# Patient Record
Sex: Male | Born: 1945 | Race: White | Hispanic: No | Marital: Married | Smoking: Former smoker
Health system: Southern US, Community
[De-identification: ages and names within clinical notes are randomized; demographics above are authoritative.]

## PROBLEM LIST (undated history)

## (undated) DIAGNOSIS — I10 Essential (primary) hypertension: Secondary | ICD-10-CM

## (undated) DIAGNOSIS — I2510 Atherosclerotic heart disease of native coronary artery without angina pectoris: Secondary | ICD-10-CM

## (undated) DIAGNOSIS — L409 Psoriasis, unspecified: Secondary | ICD-10-CM

## (undated) DIAGNOSIS — E785 Hyperlipidemia, unspecified: Secondary | ICD-10-CM

## (undated) DIAGNOSIS — I6529 Occlusion and stenosis of unspecified carotid artery: Secondary | ICD-10-CM

## (undated) DIAGNOSIS — C801 Malignant (primary) neoplasm, unspecified: Secondary | ICD-10-CM

## (undated) HISTORY — DX: Occlusion and stenosis of unspecified carotid artery: I65.29

## (undated) HISTORY — DX: Essential (primary) hypertension: I10

## (undated) HISTORY — PX: COLONOSCOPY: SHX174

## (undated) HISTORY — DX: Atherosclerotic heart disease of native coronary artery without angina pectoris: I25.10

## (undated) HISTORY — PX: SHOULDER SURGERY: SHX246

## (undated) HISTORY — DX: Hyperlipidemia, unspecified: E78.5

## (undated) HISTORY — DX: Psoriasis, unspecified: L40.9

## (undated) HISTORY — PX: CORONARY ANGIOPLASTY WITH STENT PLACEMENT: SHX49

## (undated) HISTORY — PX: OTHER SURGICAL HISTORY: SHX169

## (undated) HISTORY — PX: CHOLECYSTECTOMY: SHX55

## (undated) HISTORY — DX: Malignant (primary) neoplasm, unspecified: C80.1

---

## 1997-08-17 HISTORY — PX: LIVER BIOPSY: SHX301

## 2004-09-10 ENCOUNTER — Emergency Department: Payer: Self-pay | Admitting: General Surgery

## 2009-10-06 ENCOUNTER — Emergency Department: Payer: Self-pay | Admitting: Urology

## 2010-10-07 ENCOUNTER — Emergency Department: Payer: Self-pay | Admitting: Emergency Medicine

## 2012-07-04 DIAGNOSIS — E785 Hyperlipidemia, unspecified: Secondary | ICD-10-CM | POA: Diagnosis not present

## 2012-07-06 DIAGNOSIS — I4891 Unspecified atrial fibrillation: Secondary | ICD-10-CM | POA: Diagnosis not present

## 2012-07-08 DIAGNOSIS — E782 Mixed hyperlipidemia: Secondary | ICD-10-CM | POA: Diagnosis not present

## 2012-07-08 DIAGNOSIS — I209 Angina pectoris, unspecified: Secondary | ICD-10-CM | POA: Diagnosis not present

## 2012-07-08 DIAGNOSIS — I4891 Unspecified atrial fibrillation: Secondary | ICD-10-CM | POA: Diagnosis not present

## 2012-07-08 DIAGNOSIS — I10 Essential (primary) hypertension: Secondary | ICD-10-CM | POA: Diagnosis not present

## 2012-07-12 DIAGNOSIS — I4891 Unspecified atrial fibrillation: Secondary | ICD-10-CM | POA: Diagnosis not present

## 2012-07-22 DIAGNOSIS — I209 Angina pectoris, unspecified: Secondary | ICD-10-CM | POA: Diagnosis not present

## 2012-07-25 DIAGNOSIS — R9430 Abnormal result of cardiovascular function study, unspecified: Secondary | ICD-10-CM | POA: Diagnosis not present

## 2012-07-25 DIAGNOSIS — I10 Essential (primary) hypertension: Secondary | ICD-10-CM | POA: Diagnosis not present

## 2012-07-25 DIAGNOSIS — I4891 Unspecified atrial fibrillation: Secondary | ICD-10-CM | POA: Diagnosis not present

## 2012-07-25 DIAGNOSIS — E782 Mixed hyperlipidemia: Secondary | ICD-10-CM | POA: Diagnosis not present

## 2012-07-27 ENCOUNTER — Ambulatory Visit: Payer: Self-pay | Admitting: Internal Medicine

## 2012-07-27 DIAGNOSIS — R079 Chest pain, unspecified: Secondary | ICD-10-CM | POA: Diagnosis not present

## 2012-07-27 DIAGNOSIS — I2510 Atherosclerotic heart disease of native coronary artery without angina pectoris: Secondary | ICD-10-CM | POA: Diagnosis not present

## 2012-07-27 DIAGNOSIS — I200 Unstable angina: Secondary | ICD-10-CM | POA: Diagnosis not present

## 2012-07-27 DIAGNOSIS — I10 Essential (primary) hypertension: Secondary | ICD-10-CM | POA: Diagnosis not present

## 2012-07-27 DIAGNOSIS — R0602 Shortness of breath: Secondary | ICD-10-CM | POA: Diagnosis not present

## 2012-07-27 DIAGNOSIS — E785 Hyperlipidemia, unspecified: Secondary | ICD-10-CM | POA: Diagnosis not present

## 2012-07-27 DIAGNOSIS — Z7982 Long term (current) use of aspirin: Secondary | ICD-10-CM | POA: Diagnosis not present

## 2012-07-27 DIAGNOSIS — I209 Angina pectoris, unspecified: Secondary | ICD-10-CM | POA: Diagnosis not present

## 2012-07-27 DIAGNOSIS — Z79899 Other long term (current) drug therapy: Secondary | ICD-10-CM | POA: Diagnosis not present

## 2012-07-27 DIAGNOSIS — I4891 Unspecified atrial fibrillation: Secondary | ICD-10-CM | POA: Diagnosis not present

## 2012-07-28 DIAGNOSIS — I2510 Atherosclerotic heart disease of native coronary artery without angina pectoris: Secondary | ICD-10-CM | POA: Diagnosis not present

## 2012-07-28 DIAGNOSIS — I10 Essential (primary) hypertension: Secondary | ICD-10-CM | POA: Diagnosis not present

## 2012-07-28 DIAGNOSIS — I209 Angina pectoris, unspecified: Secondary | ICD-10-CM | POA: Diagnosis not present

## 2012-07-28 LAB — BASIC METABOLIC PANEL
Anion Gap: 8 (ref 7–16)
Co2: 26 mmol/L (ref 21–32)
Creatinine: 1.3 mg/dL (ref 0.60–1.30)
EGFR (African American): >60
Potassium: 3.9 mmol/L (ref 3.5–5.1)
Sodium: 140 mmol/L (ref 136–145)

## 2012-08-03 DIAGNOSIS — Z23 Encounter for immunization: Secondary | ICD-10-CM | POA: Diagnosis not present

## 2012-08-19 DIAGNOSIS — I2510 Atherosclerotic heart disease of native coronary artery without angina pectoris: Secondary | ICD-10-CM | POA: Diagnosis not present

## 2012-08-19 DIAGNOSIS — E782 Mixed hyperlipidemia: Secondary | ICD-10-CM | POA: Diagnosis not present

## 2012-08-19 DIAGNOSIS — I495 Sick sinus syndrome: Secondary | ICD-10-CM | POA: Diagnosis not present

## 2012-08-19 DIAGNOSIS — I10 Essential (primary) hypertension: Secondary | ICD-10-CM | POA: Diagnosis not present

## 2012-09-07 ENCOUNTER — Encounter: Payer: Self-pay | Admitting: Internal Medicine

## 2012-09-07 DIAGNOSIS — I509 Heart failure, unspecified: Secondary | ICD-10-CM | POA: Diagnosis not present

## 2012-09-07 DIAGNOSIS — Z9861 Coronary angioplasty status: Secondary | ICD-10-CM | POA: Diagnosis not present

## 2012-09-07 DIAGNOSIS — Z951 Presence of aortocoronary bypass graft: Secondary | ICD-10-CM | POA: Diagnosis not present

## 2012-09-07 DIAGNOSIS — I209 Angina pectoris, unspecified: Secondary | ICD-10-CM | POA: Diagnosis not present

## 2012-09-07 DIAGNOSIS — I252 Old myocardial infarction: Secondary | ICD-10-CM | POA: Diagnosis not present

## 2012-09-07 DIAGNOSIS — Z5189 Encounter for other specified aftercare: Secondary | ICD-10-CM | POA: Diagnosis not present

## 2012-09-17 ENCOUNTER — Encounter: Payer: Self-pay | Admitting: Internal Medicine

## 2012-09-17 DIAGNOSIS — I252 Old myocardial infarction: Secondary | ICD-10-CM | POA: Diagnosis not present

## 2012-09-17 DIAGNOSIS — Z9861 Coronary angioplasty status: Secondary | ICD-10-CM | POA: Diagnosis not present

## 2012-09-17 DIAGNOSIS — Z5189 Encounter for other specified aftercare: Secondary | ICD-10-CM | POA: Diagnosis not present

## 2012-09-17 DIAGNOSIS — I509 Heart failure, unspecified: Secondary | ICD-10-CM | POA: Diagnosis not present

## 2012-09-17 DIAGNOSIS — Z951 Presence of aortocoronary bypass graft: Secondary | ICD-10-CM | POA: Diagnosis not present

## 2012-09-20 DIAGNOSIS — I2510 Atherosclerotic heart disease of native coronary artery without angina pectoris: Secondary | ICD-10-CM | POA: Diagnosis not present

## 2012-09-20 DIAGNOSIS — I495 Sick sinus syndrome: Secondary | ICD-10-CM | POA: Diagnosis not present

## 2012-09-20 DIAGNOSIS — E782 Mixed hyperlipidemia: Secondary | ICD-10-CM | POA: Diagnosis not present

## 2012-09-20 DIAGNOSIS — I10 Essential (primary) hypertension: Secondary | ICD-10-CM | POA: Diagnosis not present

## 2012-09-22 DIAGNOSIS — L408 Other psoriasis: Secondary | ICD-10-CM | POA: Diagnosis not present

## 2012-10-06 DIAGNOSIS — L408 Other psoriasis: Secondary | ICD-10-CM | POA: Diagnosis not present

## 2012-10-06 DIAGNOSIS — Z79899 Other long term (current) drug therapy: Secondary | ICD-10-CM | POA: Diagnosis not present

## 2012-10-15 ENCOUNTER — Encounter: Payer: Self-pay | Admitting: Internal Medicine

## 2012-10-15 DIAGNOSIS — Z5189 Encounter for other specified aftercare: Secondary | ICD-10-CM | POA: Diagnosis not present

## 2012-10-15 DIAGNOSIS — Z9861 Coronary angioplasty status: Secondary | ICD-10-CM | POA: Diagnosis not present

## 2012-10-15 DIAGNOSIS — I209 Angina pectoris, unspecified: Secondary | ICD-10-CM | POA: Diagnosis not present

## 2012-10-19 DIAGNOSIS — I495 Sick sinus syndrome: Secondary | ICD-10-CM | POA: Diagnosis not present

## 2012-10-19 DIAGNOSIS — E782 Mixed hyperlipidemia: Secondary | ICD-10-CM | POA: Diagnosis not present

## 2012-10-19 DIAGNOSIS — I119 Hypertensive heart disease without heart failure: Secondary | ICD-10-CM | POA: Diagnosis not present

## 2012-10-19 DIAGNOSIS — I2510 Atherosclerotic heart disease of native coronary artery without angina pectoris: Secondary | ICD-10-CM | POA: Diagnosis not present

## 2012-10-25 DIAGNOSIS — C61 Malignant neoplasm of prostate: Secondary | ICD-10-CM | POA: Diagnosis not present

## 2012-11-14 DIAGNOSIS — E7800 Pure hypercholesterolemia, unspecified: Secondary | ICD-10-CM | POA: Diagnosis not present

## 2012-11-14 DIAGNOSIS — I10 Essential (primary) hypertension: Secondary | ICD-10-CM | POA: Diagnosis not present

## 2012-11-14 DIAGNOSIS — I2510 Atherosclerotic heart disease of native coronary artery without angina pectoris: Secondary | ICD-10-CM | POA: Diagnosis not present

## 2012-11-15 ENCOUNTER — Encounter: Payer: Self-pay | Admitting: Internal Medicine

## 2012-11-15 DIAGNOSIS — Z9861 Coronary angioplasty status: Secondary | ICD-10-CM | POA: Diagnosis not present

## 2012-11-15 DIAGNOSIS — Z5189 Encounter for other specified aftercare: Secondary | ICD-10-CM | POA: Diagnosis not present

## 2012-11-16 DIAGNOSIS — Z9861 Coronary angioplasty status: Secondary | ICD-10-CM | POA: Diagnosis not present

## 2012-11-16 DIAGNOSIS — Z5189 Encounter for other specified aftercare: Secondary | ICD-10-CM | POA: Diagnosis not present

## 2012-12-01 DIAGNOSIS — I119 Hypertensive heart disease without heart failure: Secondary | ICD-10-CM | POA: Diagnosis not present

## 2012-12-01 DIAGNOSIS — I2510 Atherosclerotic heart disease of native coronary artery without angina pectoris: Secondary | ICD-10-CM | POA: Diagnosis not present

## 2012-12-01 DIAGNOSIS — I209 Angina pectoris, unspecified: Secondary | ICD-10-CM | POA: Diagnosis not present

## 2012-12-01 DIAGNOSIS — E782 Mixed hyperlipidemia: Secondary | ICD-10-CM | POA: Diagnosis not present

## 2012-12-12 DIAGNOSIS — L408 Other psoriasis: Secondary | ICD-10-CM | POA: Diagnosis not present

## 2012-12-30 DIAGNOSIS — I209 Angina pectoris, unspecified: Secondary | ICD-10-CM | POA: Diagnosis not present

## 2013-01-03 DIAGNOSIS — E7800 Pure hypercholesterolemia, unspecified: Secondary | ICD-10-CM | POA: Diagnosis not present

## 2013-01-03 DIAGNOSIS — E785 Hyperlipidemia, unspecified: Secondary | ICD-10-CM | POA: Diagnosis not present

## 2013-01-03 DIAGNOSIS — I2510 Atherosclerotic heart disease of native coronary artery without angina pectoris: Secondary | ICD-10-CM | POA: Diagnosis not present

## 2013-01-03 DIAGNOSIS — I10 Essential (primary) hypertension: Secondary | ICD-10-CM | POA: Diagnosis not present

## 2013-02-01 DIAGNOSIS — I2510 Atherosclerotic heart disease of native coronary artery without angina pectoris: Secondary | ICD-10-CM | POA: Diagnosis not present

## 2013-02-01 DIAGNOSIS — E7800 Pure hypercholesterolemia, unspecified: Secondary | ICD-10-CM | POA: Diagnosis not present

## 2013-02-01 DIAGNOSIS — I10 Essential (primary) hypertension: Secondary | ICD-10-CM | POA: Diagnosis not present

## 2013-02-07 DIAGNOSIS — I119 Hypertensive heart disease without heart failure: Secondary | ICD-10-CM | POA: Diagnosis not present

## 2013-02-07 DIAGNOSIS — I2510 Atherosclerotic heart disease of native coronary artery without angina pectoris: Secondary | ICD-10-CM | POA: Diagnosis not present

## 2013-02-07 DIAGNOSIS — E782 Mixed hyperlipidemia: Secondary | ICD-10-CM | POA: Diagnosis not present

## 2013-02-07 DIAGNOSIS — I495 Sick sinus syndrome: Secondary | ICD-10-CM | POA: Diagnosis not present

## 2013-05-01 DIAGNOSIS — E785 Hyperlipidemia, unspecified: Secondary | ICD-10-CM | POA: Diagnosis not present

## 2013-05-01 DIAGNOSIS — I10 Essential (primary) hypertension: Secondary | ICD-10-CM | POA: Diagnosis not present

## 2013-05-01 DIAGNOSIS — Z125 Encounter for screening for malignant neoplasm of prostate: Secondary | ICD-10-CM | POA: Diagnosis not present

## 2013-05-01 DIAGNOSIS — Z0000 Encounter for general adult medical examination without abnormal findings: Secondary | ICD-10-CM | POA: Diagnosis not present

## 2013-05-04 DIAGNOSIS — D400 Neoplasm of uncertain behavior of prostate: Secondary | ICD-10-CM | POA: Diagnosis not present

## 2013-05-04 DIAGNOSIS — N529 Male erectile dysfunction, unspecified: Secondary | ICD-10-CM | POA: Diagnosis not present

## 2013-05-04 DIAGNOSIS — C61 Malignant neoplasm of prostate: Secondary | ICD-10-CM | POA: Diagnosis not present

## 2013-05-04 DIAGNOSIS — Z23 Encounter for immunization: Secondary | ICD-10-CM | POA: Diagnosis not present

## 2013-05-12 DIAGNOSIS — I6529 Occlusion and stenosis of unspecified carotid artery: Secondary | ICD-10-CM | POA: Diagnosis not present

## 2013-05-12 DIAGNOSIS — I2510 Atherosclerotic heart disease of native coronary artery without angina pectoris: Secondary | ICD-10-CM | POA: Diagnosis not present

## 2013-05-12 DIAGNOSIS — I10 Essential (primary) hypertension: Secondary | ICD-10-CM | POA: Diagnosis not present

## 2013-07-31 DIAGNOSIS — I4949 Other premature depolarization: Secondary | ICD-10-CM | POA: Diagnosis not present

## 2013-07-31 DIAGNOSIS — I059 Rheumatic mitral valve disease, unspecified: Secondary | ICD-10-CM | POA: Diagnosis not present

## 2013-07-31 DIAGNOSIS — I2510 Atherosclerotic heart disease of native coronary artery without angina pectoris: Secondary | ICD-10-CM | POA: Diagnosis not present

## 2013-07-31 DIAGNOSIS — I119 Hypertensive heart disease without heart failure: Secondary | ICD-10-CM | POA: Diagnosis not present

## 2013-09-13 DIAGNOSIS — L408 Other psoriasis: Secondary | ICD-10-CM | POA: Diagnosis not present

## 2013-10-09 DIAGNOSIS — C61 Malignant neoplasm of prostate: Secondary | ICD-10-CM | POA: Diagnosis not present

## 2013-10-09 DIAGNOSIS — I10 Essential (primary) hypertension: Secondary | ICD-10-CM | POA: Diagnosis not present

## 2013-10-09 DIAGNOSIS — E785 Hyperlipidemia, unspecified: Secondary | ICD-10-CM | POA: Diagnosis not present

## 2013-10-11 DIAGNOSIS — K648 Other hemorrhoids: Secondary | ICD-10-CM | POA: Diagnosis not present

## 2013-10-11 DIAGNOSIS — I10 Essential (primary) hypertension: Secondary | ICD-10-CM | POA: Diagnosis not present

## 2013-10-24 DIAGNOSIS — D400 Neoplasm of uncertain behavior of prostate: Secondary | ICD-10-CM | POA: Diagnosis not present

## 2013-10-24 DIAGNOSIS — C61 Malignant neoplasm of prostate: Secondary | ICD-10-CM | POA: Diagnosis not present

## 2013-11-08 DIAGNOSIS — I2510 Atherosclerotic heart disease of native coronary artery without angina pectoris: Secondary | ICD-10-CM | POA: Diagnosis not present

## 2013-11-08 DIAGNOSIS — I10 Essential (primary) hypertension: Secondary | ICD-10-CM | POA: Diagnosis not present

## 2013-11-08 DIAGNOSIS — E785 Hyperlipidemia, unspecified: Secondary | ICD-10-CM | POA: Diagnosis not present

## 2013-11-10 DIAGNOSIS — I10 Essential (primary) hypertension: Secondary | ICD-10-CM | POA: Diagnosis not present

## 2013-11-10 DIAGNOSIS — I6529 Occlusion and stenosis of unspecified carotid artery: Secondary | ICD-10-CM | POA: Diagnosis not present

## 2013-12-14 DIAGNOSIS — I10 Essential (primary) hypertension: Secondary | ICD-10-CM | POA: Diagnosis not present

## 2014-01-29 DIAGNOSIS — I10 Essential (primary) hypertension: Secondary | ICD-10-CM | POA: Diagnosis not present

## 2014-01-29 DIAGNOSIS — I498 Other specified cardiac arrhythmias: Secondary | ICD-10-CM | POA: Diagnosis not present

## 2014-01-29 DIAGNOSIS — I4891 Unspecified atrial fibrillation: Secondary | ICD-10-CM | POA: Diagnosis not present

## 2014-01-29 DIAGNOSIS — I2510 Atherosclerotic heart disease of native coronary artery without angina pectoris: Secondary | ICD-10-CM | POA: Diagnosis not present

## 2014-04-24 DIAGNOSIS — C61 Malignant neoplasm of prostate: Secondary | ICD-10-CM | POA: Diagnosis not present

## 2014-04-24 DIAGNOSIS — D400 Neoplasm of uncertain behavior of prostate: Secondary | ICD-10-CM | POA: Diagnosis not present

## 2014-04-24 DIAGNOSIS — N529 Male erectile dysfunction, unspecified: Secondary | ICD-10-CM | POA: Diagnosis not present

## 2014-04-25 DIAGNOSIS — L408 Other psoriasis: Secondary | ICD-10-CM | POA: Diagnosis not present

## 2014-05-03 DIAGNOSIS — Z23 Encounter for immunization: Secondary | ICD-10-CM | POA: Diagnosis not present

## 2014-05-07 DIAGNOSIS — Z125 Encounter for screening for malignant neoplasm of prostate: Secondary | ICD-10-CM | POA: Diagnosis not present

## 2014-05-07 DIAGNOSIS — I2510 Atherosclerotic heart disease of native coronary artery without angina pectoris: Secondary | ICD-10-CM | POA: Diagnosis not present

## 2014-05-07 DIAGNOSIS — E785 Hyperlipidemia, unspecified: Secondary | ICD-10-CM | POA: Diagnosis not present

## 2014-05-07 DIAGNOSIS — I10 Essential (primary) hypertension: Secondary | ICD-10-CM | POA: Diagnosis not present

## 2014-05-07 DIAGNOSIS — I6529 Occlusion and stenosis of unspecified carotid artery: Secondary | ICD-10-CM | POA: Diagnosis not present

## 2014-05-07 DIAGNOSIS — Z23 Encounter for immunization: Secondary | ICD-10-CM | POA: Diagnosis not present

## 2014-05-07 DIAGNOSIS — Z0000 Encounter for general adult medical examination without abnormal findings: Secondary | ICD-10-CM | POA: Diagnosis not present

## 2014-05-07 DIAGNOSIS — D075 Carcinoma in situ of prostate: Secondary | ICD-10-CM | POA: Diagnosis not present

## 2014-05-18 DIAGNOSIS — Z79899 Other long term (current) drug therapy: Secondary | ICD-10-CM | POA: Diagnosis not present

## 2014-05-18 DIAGNOSIS — Z1211 Encounter for screening for malignant neoplasm of colon: Secondary | ICD-10-CM | POA: Diagnosis not present

## 2014-06-11 ENCOUNTER — Ambulatory Visit: Payer: Self-pay | Admitting: Gastroenterology

## 2014-06-11 DIAGNOSIS — I10 Essential (primary) hypertension: Secondary | ICD-10-CM | POA: Diagnosis not present

## 2014-06-11 DIAGNOSIS — K5730 Diverticulosis of large intestine without perforation or abscess without bleeding: Secondary | ICD-10-CM | POA: Diagnosis not present

## 2014-06-11 DIAGNOSIS — D120 Benign neoplasm of cecum: Secondary | ICD-10-CM | POA: Diagnosis not present

## 2014-06-11 DIAGNOSIS — R12 Heartburn: Secondary | ICD-10-CM | POA: Diagnosis not present

## 2014-06-11 DIAGNOSIS — Z6831 Body mass index (BMI) 31.0-31.9, adult: Secondary | ICD-10-CM | POA: Diagnosis not present

## 2014-06-11 DIAGNOSIS — E669 Obesity, unspecified: Secondary | ICD-10-CM | POA: Diagnosis not present

## 2014-06-11 DIAGNOSIS — D124 Benign neoplasm of descending colon: Secondary | ICD-10-CM | POA: Diagnosis not present

## 2014-06-11 DIAGNOSIS — M159 Polyosteoarthritis, unspecified: Secondary | ICD-10-CM | POA: Diagnosis not present

## 2014-06-11 DIAGNOSIS — Z1211 Encounter for screening for malignant neoplasm of colon: Secondary | ICD-10-CM | POA: Diagnosis not present

## 2014-06-11 DIAGNOSIS — L408 Other psoriasis: Secondary | ICD-10-CM | POA: Diagnosis not present

## 2014-06-11 DIAGNOSIS — I2510 Atherosclerotic heart disease of native coronary artery without angina pectoris: Secondary | ICD-10-CM | POA: Diagnosis not present

## 2014-06-11 DIAGNOSIS — D126 Benign neoplasm of colon, unspecified: Secondary | ICD-10-CM | POA: Diagnosis not present

## 2014-06-11 DIAGNOSIS — D125 Benign neoplasm of sigmoid colon: Secondary | ICD-10-CM | POA: Diagnosis not present

## 2014-06-11 DIAGNOSIS — Z87891 Personal history of nicotine dependence: Secondary | ICD-10-CM | POA: Diagnosis not present

## 2014-06-11 DIAGNOSIS — Z955 Presence of coronary angioplasty implant and graft: Secondary | ICD-10-CM | POA: Diagnosis not present

## 2014-06-11 DIAGNOSIS — Z79899 Other long term (current) drug therapy: Secondary | ICD-10-CM | POA: Diagnosis not present

## 2014-06-11 DIAGNOSIS — E785 Hyperlipidemia, unspecified: Secondary | ICD-10-CM | POA: Diagnosis not present

## 2014-06-11 DIAGNOSIS — D122 Benign neoplasm of ascending colon: Secondary | ICD-10-CM | POA: Diagnosis not present

## 2014-06-11 DIAGNOSIS — Z8546 Personal history of malignant neoplasm of prostate: Secondary | ICD-10-CM | POA: Diagnosis not present

## 2014-06-11 DIAGNOSIS — D123 Benign neoplasm of transverse colon: Secondary | ICD-10-CM | POA: Diagnosis not present

## 2014-07-31 DIAGNOSIS — I255 Ischemic cardiomyopathy: Secondary | ICD-10-CM | POA: Insufficient documentation

## 2014-07-31 DIAGNOSIS — E782 Mixed hyperlipidemia: Secondary | ICD-10-CM | POA: Diagnosis not present

## 2014-07-31 DIAGNOSIS — I10 Essential (primary) hypertension: Secondary | ICD-10-CM | POA: Diagnosis not present

## 2014-07-31 DIAGNOSIS — I480 Paroxysmal atrial fibrillation: Secondary | ICD-10-CM | POA: Insufficient documentation

## 2014-07-31 DIAGNOSIS — R072 Precordial pain: Secondary | ICD-10-CM | POA: Diagnosis not present

## 2014-08-13 DIAGNOSIS — I10 Essential (primary) hypertension: Secondary | ICD-10-CM | POA: Diagnosis not present

## 2014-08-13 DIAGNOSIS — I255 Ischemic cardiomyopathy: Secondary | ICD-10-CM | POA: Diagnosis not present

## 2014-08-13 DIAGNOSIS — R0789 Other chest pain: Secondary | ICD-10-CM | POA: Diagnosis not present

## 2014-08-13 DIAGNOSIS — I2510 Atherosclerotic heart disease of native coronary artery without angina pectoris: Secondary | ICD-10-CM | POA: Diagnosis not present

## 2014-08-13 DIAGNOSIS — E782 Mixed hyperlipidemia: Secondary | ICD-10-CM | POA: Diagnosis not present

## 2014-08-24 DIAGNOSIS — C44319 Basal cell carcinoma of skin of other parts of face: Secondary | ICD-10-CM | POA: Diagnosis not present

## 2014-08-24 DIAGNOSIS — L570 Actinic keratosis: Secondary | ICD-10-CM | POA: Diagnosis not present

## 2014-08-24 DIAGNOSIS — Z5181 Encounter for therapeutic drug level monitoring: Secondary | ICD-10-CM | POA: Diagnosis not present

## 2014-08-24 DIAGNOSIS — L400 Psoriasis vulgaris: Secondary | ICD-10-CM | POA: Diagnosis not present

## 2014-08-24 DIAGNOSIS — L409 Psoriasis, unspecified: Secondary | ICD-10-CM | POA: Diagnosis not present

## 2014-08-24 DIAGNOSIS — D485 Neoplasm of uncertain behavior of skin: Secondary | ICD-10-CM | POA: Diagnosis not present

## 2014-09-02 DIAGNOSIS — I25119 Atherosclerotic heart disease of native coronary artery with unspecified angina pectoris: Secondary | ICD-10-CM | POA: Diagnosis not present

## 2014-09-02 DIAGNOSIS — E782 Mixed hyperlipidemia: Secondary | ICD-10-CM | POA: Diagnosis not present

## 2014-09-02 DIAGNOSIS — C61 Malignant neoplasm of prostate: Secondary | ICD-10-CM | POA: Diagnosis not present

## 2014-09-02 DIAGNOSIS — K567 Ileus, unspecified: Secondary | ICD-10-CM | POA: Diagnosis not present

## 2014-09-02 DIAGNOSIS — I517 Cardiomegaly: Secondary | ICD-10-CM | POA: Diagnosis not present

## 2014-09-02 DIAGNOSIS — I10 Essential (primary) hypertension: Secondary | ICD-10-CM | POA: Diagnosis not present

## 2014-09-02 DIAGNOSIS — K6389 Other specified diseases of intestine: Secondary | ICD-10-CM | POA: Diagnosis not present

## 2014-09-02 DIAGNOSIS — I200 Unstable angina: Secondary | ICD-10-CM | POA: Diagnosis not present

## 2014-09-02 DIAGNOSIS — R0789 Other chest pain: Secondary | ICD-10-CM | POA: Diagnosis not present

## 2014-09-02 DIAGNOSIS — I2510 Atherosclerotic heart disease of native coronary artery without angina pectoris: Secondary | ICD-10-CM | POA: Diagnosis not present

## 2014-09-02 DIAGNOSIS — R079 Chest pain, unspecified: Secondary | ICD-10-CM | POA: Diagnosis not present

## 2014-09-02 DIAGNOSIS — R109 Unspecified abdominal pain: Secondary | ICD-10-CM | POA: Diagnosis not present

## 2014-09-02 LAB — CBC
HCT: 43.3 % (ref 40.0–52.0)
HGB: 14.5 g/dL (ref 13.0–18.0)
MCH: 32.6 pg (ref 26.0–34.0)
MCHC: 33.6 g/dL (ref 32.0–36.0)
MCV: 97 fL (ref 80–100)
Platelet: 157 10*3/uL (ref 150–440)
RBC: 4.45 10*6/uL (ref 4.40–5.90)
RDW: 13.1 % (ref 11.5–14.5)
WBC: 9.2 10*3/uL (ref 3.8–10.6)

## 2014-09-02 LAB — BASIC METABOLIC PANEL
Anion Gap: 7 (ref 7–16)
BUN: 25 mg/dL — ABNORMAL HIGH (ref 7–18)
Calcium, Total: 9.2 mg/dL (ref 8.5–10.1)
Chloride: 106 mmol/L (ref 98–107)
Co2: 28 mmol/L (ref 21–32)
Creatinine: 1.3 mg/dL (ref 0.60–1.30)
EGFR (Non-African Amer.): 59 — ABNORMAL LOW
Glucose: 124 mg/dL — ABNORMAL HIGH (ref 65–99)
OSMOLALITY: 287 (ref 275–301)
POTASSIUM: 3.7 mmol/L (ref 3.5–5.1)
Sodium: 141 mmol/L (ref 136–145)

## 2014-09-02 LAB — TROPONIN I: Troponin-I: <0.02 ng/mL

## 2014-09-02 LAB — CK TOTAL AND CKMB (NOT AT ARMC)
CK, TOTAL: 102 U/L (ref 39–308)
CK, Total: 119 U/L (ref 39–308)
CK, Total: 108 U/L (ref 39–308)
CK-MB: 1.2 ng/mL (ref 0.5–3.6)
CK-MB: 0.9 ng/mL (ref 0.5–3.6)
CK-MB: 1 ng/mL (ref 0.5–3.6)

## 2014-09-02 LAB — PROTIME-INR
INR: 0.9
PROTHROMBIN TIME: 12.5 s (ref 11.5–14.7)

## 2014-09-03 ENCOUNTER — Inpatient Hospital Stay: Payer: Self-pay | Admitting: Internal Medicine

## 2014-09-03 DIAGNOSIS — M109 Gout, unspecified: Secondary | ICD-10-CM | POA: Diagnosis present

## 2014-09-03 DIAGNOSIS — K810 Acute cholecystitis: Secondary | ICD-10-CM | POA: Diagnosis not present

## 2014-09-03 DIAGNOSIS — Z800 Family history of malignant neoplasm of digestive organs: Secondary | ICD-10-CM | POA: Diagnosis not present

## 2014-09-03 DIAGNOSIS — I10 Essential (primary) hypertension: Secondary | ICD-10-CM | POA: Diagnosis not present

## 2014-09-03 DIAGNOSIS — K567 Ileus, unspecified: Secondary | ICD-10-CM | POA: Diagnosis present

## 2014-09-03 DIAGNOSIS — I25119 Atherosclerotic heart disease of native coronary artery with unspecified angina pectoris: Secondary | ICD-10-CM | POA: Diagnosis not present

## 2014-09-03 DIAGNOSIS — K819 Cholecystitis, unspecified: Secondary | ICD-10-CM | POA: Diagnosis not present

## 2014-09-03 DIAGNOSIS — I2510 Atherosclerotic heart disease of native coronary artery without angina pectoris: Secondary | ICD-10-CM | POA: Diagnosis present

## 2014-09-03 DIAGNOSIS — E669 Obesity, unspecified: Secondary | ICD-10-CM | POA: Diagnosis present

## 2014-09-03 DIAGNOSIS — K5660 Unspecified intestinal obstruction: Secondary | ICD-10-CM | POA: Diagnosis present

## 2014-09-03 DIAGNOSIS — I200 Unstable angina: Secondary | ICD-10-CM | POA: Diagnosis not present

## 2014-09-03 DIAGNOSIS — K5730 Diverticulosis of large intestine without perforation or abscess without bleeding: Secondary | ICD-10-CM | POA: Diagnosis not present

## 2014-09-03 DIAGNOSIS — C61 Malignant neoplasm of prostate: Secondary | ICD-10-CM | POA: Diagnosis not present

## 2014-09-03 DIAGNOSIS — J189 Pneumonia, unspecified organism: Secondary | ICD-10-CM | POA: Diagnosis not present

## 2014-09-03 DIAGNOSIS — I517 Cardiomegaly: Secondary | ICD-10-CM | POA: Diagnosis not present

## 2014-09-03 DIAGNOSIS — Z6832 Body mass index (BMI) 32.0-32.9, adult: Secondary | ICD-10-CM | POA: Diagnosis not present

## 2014-09-03 DIAGNOSIS — R079 Chest pain, unspecified: Secondary | ICD-10-CM | POA: Diagnosis not present

## 2014-09-03 DIAGNOSIS — L409 Psoriasis, unspecified: Secondary | ICD-10-CM | POA: Diagnosis present

## 2014-09-03 DIAGNOSIS — Z955 Presence of coronary angioplasty implant and graft: Secondary | ICD-10-CM | POA: Diagnosis not present

## 2014-09-03 DIAGNOSIS — N3289 Other specified disorders of bladder: Secondary | ICD-10-CM | POA: Diagnosis not present

## 2014-09-03 DIAGNOSIS — Z87891 Personal history of nicotine dependence: Secondary | ICD-10-CM | POA: Diagnosis not present

## 2014-09-03 DIAGNOSIS — Z7902 Long term (current) use of antithrombotics/antiplatelets: Secondary | ICD-10-CM | POA: Diagnosis not present

## 2014-09-03 DIAGNOSIS — E782 Mixed hyperlipidemia: Secondary | ICD-10-CM | POA: Diagnosis present

## 2014-09-03 DIAGNOSIS — K598 Other specified functional intestinal disorders: Secondary | ICD-10-CM | POA: Diagnosis not present

## 2014-09-03 DIAGNOSIS — Z833 Family history of diabetes mellitus: Secondary | ICD-10-CM | POA: Diagnosis not present

## 2014-09-03 DIAGNOSIS — Z8249 Family history of ischemic heart disease and other diseases of the circulatory system: Secondary | ICD-10-CM | POA: Diagnosis not present

## 2014-09-03 DIAGNOSIS — K8020 Calculus of gallbladder without cholecystitis without obstruction: Secondary | ICD-10-CM | POA: Diagnosis present

## 2014-09-03 DIAGNOSIS — J90 Pleural effusion, not elsewhere classified: Secondary | ICD-10-CM | POA: Diagnosis not present

## 2014-09-03 DIAGNOSIS — Z8546 Personal history of malignant neoplasm of prostate: Secondary | ICD-10-CM | POA: Diagnosis not present

## 2014-09-03 DIAGNOSIS — K8000 Calculus of gallbladder with acute cholecystitis without obstruction: Secondary | ICD-10-CM | POA: Diagnosis present

## 2014-09-03 DIAGNOSIS — K6389 Other specified diseases of intestine: Secondary | ICD-10-CM | POA: Diagnosis not present

## 2014-09-03 LAB — BASIC METABOLIC PANEL
Anion Gap: 8 (ref 7–16)
BUN: 17 mg/dL (ref 7–18)
CREATININE: 1.23 mg/dL (ref 0.60–1.30)
Calcium, Total: 9.4 mg/dL (ref 8.5–10.1)
Chloride: 98 mmol/L (ref 98–107)
Co2: 29 mmol/L (ref 21–32)
EGFR (African American): >60
EGFR (Non-African Amer.): >60
Glucose: 148 mg/dL — ABNORMAL HIGH (ref 65–99)
Osmolality: 274 (ref 275–301)
Potassium: 3.5 mmol/L (ref 3.5–5.1)
Sodium: 135 mmol/L — ABNORMAL LOW (ref 136–145)

## 2014-09-05 LAB — BASIC METABOLIC PANEL
Anion Gap: 5 — ABNORMAL LOW (ref 7–16)
BUN: 26 mg/dL — AB (ref 7–18)
CHLORIDE: 98 mmol/L (ref 98–107)
CO2: 32 mmol/L (ref 21–32)
CREATININE: 1.36 mg/dL — AB (ref 0.60–1.30)
Calcium, Total: 7.8 mg/dL — ABNORMAL LOW (ref 8.5–10.1)
GFR CALC NON AF AMER: 56 — AB
Glucose: 108 mg/dL — ABNORMAL HIGH (ref 65–99)
Osmolality: 275 (ref 275–301)
Potassium: 3.8 mmol/L (ref 3.5–5.1)
SODIUM: 135 mmol/L — AB (ref 136–145)

## 2014-09-05 LAB — CBC WITH DIFFERENTIAL/PLATELET
Basophil #: 0 10*3/uL (ref 0.0–0.1)
Basophil %: 0.1 %
EOS ABS: 0 10*3/uL (ref 0.0–0.7)
Eosinophil %: 0.2 %
HCT: 34.7 % — ABNORMAL LOW (ref 40.0–52.0)
HGB: 11.8 g/dL — ABNORMAL LOW (ref 13.0–18.0)
Lymphocyte #: 0.5 10*3/uL — ABNORMAL LOW (ref 1.0–3.6)
Lymphocyte %: 6.6 %
MCH: 32.6 pg (ref 26.0–34.0)
MCHC: 34.1 g/dL (ref 32.0–36.0)
MCV: 96 fL (ref 80–100)
MONO ABS: 0.9 x10 3/mm (ref 0.2–1.0)
Monocyte %: 11.1 %
Neutrophil #: 6.9 10*3/uL — ABNORMAL HIGH (ref 1.4–6.5)
Neutrophil %: 82 %
Platelet: 106 10*3/uL — ABNORMAL LOW (ref 150–440)
RBC: 3.63 10*6/uL — ABNORMAL LOW (ref 4.40–5.90)
RDW: 13.2 % (ref 11.5–14.5)
WBC: 8.4 10*3/uL (ref 3.8–10.6)

## 2014-09-12 DIAGNOSIS — K810 Acute cholecystitis: Secondary | ICD-10-CM | POA: Diagnosis not present

## 2014-09-18 DIAGNOSIS — R109 Unspecified abdominal pain: Secondary | ICD-10-CM | POA: Diagnosis not present

## 2014-09-24 DIAGNOSIS — C44319 Basal cell carcinoma of skin of other parts of face: Secondary | ICD-10-CM | POA: Diagnosis not present

## 2014-10-04 ENCOUNTER — Ambulatory Visit: Payer: Self-pay | Admitting: Surgery

## 2014-10-04 DIAGNOSIS — Z79899 Other long term (current) drug therapy: Secondary | ICD-10-CM | POA: Diagnosis not present

## 2014-10-04 DIAGNOSIS — I2510 Atherosclerotic heart disease of native coronary artery without angina pectoris: Secondary | ICD-10-CM | POA: Diagnosis not present

## 2014-10-04 DIAGNOSIS — I10 Essential (primary) hypertension: Secondary | ICD-10-CM | POA: Diagnosis not present

## 2014-10-04 DIAGNOSIS — M109 Gout, unspecified: Secondary | ICD-10-CM | POA: Diagnosis not present

## 2014-10-04 DIAGNOSIS — Z01812 Encounter for preprocedural laboratory examination: Secondary | ICD-10-CM | POA: Diagnosis not present

## 2014-10-04 DIAGNOSIS — K811 Chronic cholecystitis: Secondary | ICD-10-CM | POA: Diagnosis not present

## 2014-10-04 DIAGNOSIS — I499 Cardiac arrhythmia, unspecified: Secondary | ICD-10-CM | POA: Diagnosis not present

## 2014-10-04 DIAGNOSIS — E785 Hyperlipidemia, unspecified: Secondary | ICD-10-CM | POA: Diagnosis not present

## 2014-10-04 DIAGNOSIS — K567 Ileus, unspecified: Secondary | ICD-10-CM | POA: Diagnosis not present

## 2014-10-08 ENCOUNTER — Ambulatory Visit: Payer: Self-pay | Admitting: Surgery

## 2014-10-08 DIAGNOSIS — I10 Essential (primary) hypertension: Secondary | ICD-10-CM | POA: Diagnosis not present

## 2014-10-08 DIAGNOSIS — M549 Dorsalgia, unspecified: Secondary | ICD-10-CM | POA: Diagnosis not present

## 2014-10-08 DIAGNOSIS — K810 Acute cholecystitis: Secondary | ICD-10-CM | POA: Diagnosis not present

## 2014-10-08 DIAGNOSIS — I499 Cardiac arrhythmia, unspecified: Secondary | ICD-10-CM | POA: Diagnosis not present

## 2014-10-08 DIAGNOSIS — K811 Chronic cholecystitis: Secondary | ICD-10-CM | POA: Diagnosis not present

## 2014-10-08 DIAGNOSIS — K8012 Calculus of gallbladder with acute and chronic cholecystitis without obstruction: Secondary | ICD-10-CM | POA: Diagnosis not present

## 2014-10-08 DIAGNOSIS — M1990 Unspecified osteoarthritis, unspecified site: Secondary | ICD-10-CM | POA: Diagnosis not present

## 2014-10-10 DIAGNOSIS — I2510 Atherosclerotic heart disease of native coronary artery without angina pectoris: Secondary | ICD-10-CM | POA: Diagnosis not present

## 2014-10-10 DIAGNOSIS — I480 Paroxysmal atrial fibrillation: Secondary | ICD-10-CM | POA: Diagnosis not present

## 2014-10-10 DIAGNOSIS — E782 Mixed hyperlipidemia: Secondary | ICD-10-CM | POA: Diagnosis not present

## 2014-10-10 DIAGNOSIS — R0789 Other chest pain: Secondary | ICD-10-CM | POA: Diagnosis not present

## 2014-10-22 DIAGNOSIS — L400 Psoriasis vulgaris: Secondary | ICD-10-CM | POA: Diagnosis not present

## 2014-10-24 DIAGNOSIS — Z125 Encounter for screening for malignant neoplasm of prostate: Secondary | ICD-10-CM | POA: Diagnosis not present

## 2014-10-24 DIAGNOSIS — N5201 Erectile dysfunction due to arterial insufficiency: Secondary | ICD-10-CM | POA: Diagnosis not present

## 2014-10-24 DIAGNOSIS — D400 Neoplasm of uncertain behavior of prostate: Secondary | ICD-10-CM | POA: Diagnosis not present

## 2014-11-26 DIAGNOSIS — Z85828 Personal history of other malignant neoplasm of skin: Secondary | ICD-10-CM | POA: Diagnosis not present

## 2014-11-26 DIAGNOSIS — L400 Psoriasis vulgaris: Secondary | ICD-10-CM | POA: Diagnosis not present

## 2014-11-30 DIAGNOSIS — I6529 Occlusion and stenosis of unspecified carotid artery: Secondary | ICD-10-CM | POA: Diagnosis not present

## 2014-12-04 DIAGNOSIS — I6529 Occlusion and stenosis of unspecified carotid artery: Secondary | ICD-10-CM | POA: Diagnosis not present

## 2014-12-04 DIAGNOSIS — I10 Essential (primary) hypertension: Secondary | ICD-10-CM | POA: Diagnosis not present

## 2014-12-04 NOTE — Discharge Summary (Signed)
PATIENT NAME:  Darrell [name], Darrell [name] MR#:  [id number] DATE OF BIRTH:  Apr 05, 1946  DATE OF ADMISSION:  07/27/2012 DATE OF DISCHARGE:  07/28/2012  PRIMARY CARE PHYSICIAN: Dr. Golden Pop   DISCHARGE DIAGNOSES:  1. Atrial fibrillation now in normal rhythm.  2. Hypertension.  3. Hyperlipidemia.  4. Coronary artery disease with unstable angina.   HISTORY: This is a 69 year old male with the above diagnoses who had chest pain. He underwent cardiac catheterization showing significant stenosis of the left anterior descending artery at 95% and underwent PCI and stent placement of drug-eluting stent without complication. The patient also had some moderate atherosclerosis of the right coronary artery. He has not had any atrial fibrillation during his hospitalization but his blood pressure has been well controlled with medication management. He had reached his maximal hospital benefit after ambulation and felt well for discharge.   DISCHARGE MEDICATIONS:  1. Aspirin 325 mg each day.  2. Plavix 75 mg each day. 3. Metoprolol 50 mg tartrate b.i.d.  4. Amlodipine 10 mg each day. 5. Hydrochlorothiazide 25 mg each day. 6. Losartan 50 mg each day. 7. Atorvastatin 40 mg each day.   FOLLOW UP: He is to follow-up in two weeks for cardiology follow up and further treatment options. ____________________________ Corey Skains, MD bjk:cms D: 07/28/2012 08:37:28 ET T: 07/28/2012 09:58:23 ET  JOB#: [id number] cc: Corey Skains, MD, <Dictator> Corey Skains MD ELECTRONICALLY SIGNED 08/09/2012 7:48

## 2014-12-10 LAB — SURGICAL PATHOLOGY

## 2014-12-16 NOTE — Consult Note (Signed)
PATIENT NAME:  Darrell [name], Darrell [name] MR#:  [id number] DATE OF BIRTH:  1945/09/18  DATE OF CONSULTATION:  09/02/2014  REFERRING PHYSICIAN:   CONSULTING PHYSICIAN:  Micheline Maze, MD  PRIMARY CARE PHYSICIAN: Guadalupe Maple, MD   ADMITTING PHYSICIAN: [facility].  CHIEF COMPLAINT: Epigastric pain.   BRIEF HISTORY: Darrell [name] is a 69 year old gentleman admitted early this morning with epigastric pain. He has a long-standing history of coronary artery disease, having undergone a stent in the past. He has had no cardiac symptoms recently, continues on aspirin and Plavix. He noted increasing discomfort which did not improve. He had not had any significant angina up to that point. He presented to the Emergency Room with symptoms, was treated initially for possible coronary artery disease or unstable angina. Workup was really unremarkable. He has not had a bowel movement since Friday. Initially, he had plain films which demonstrated no significant abnormalities other than significant stool load. His films were repeated with a KUB midmorning, which showed a possible ileus, possible early small bowel obstruction. The surgical service was consulted.   He denies a history of significant GI problems in the past. There is no history of hepatitis, yellow jaundice, pancreatitis, peptic ulcer disease, gallbladder disease or diverticulitis. He has had no previous abdominal surgery. He had a colonoscopy in October, which revealed no significant abnormalities other than multiple polyps. No obstructive symptoms were identified. He moves his bowels regularly and has had no previous similar problems. He denies any history of constipation. He does have a history of previous prostate cancer, treated with high-frequency ultrasound. He is a reformed cigarette smoker. His only other surgery was left rotator cuff problem. He has had a history of skin problems, treated with immunosuppressive drugs.   CURRENT  MEDICATIONS: Outlined in the admission note.   ALLERGIES: He has no medical allergies.   REVIEW OF SYSTEMS: Otherwise unremarkable.   FAMILY HISTORY: Significant for coronary artery disease and end-stage renal disease from diabetes.   PHYSICAL EXAMINATION:  GENERAL: He is lying comfortably in bed, pain free at the present time.  VITAL SIGNS: His blood pressure is 170/88. Heart rate 67 and regular. He is afebrile.  HEENT: There is no scleral icterus, no pupillary abnormalities. No facial deformities.  NECK: Supple. Nontender. Midline trachea. No adenopathy.  CHEST: Clear with no adventitious sounds. He has normal pulmonary excursion.  CARDIAC: Reveals no murmurs or gallops. To my ear, he seems to be in normal sinus rhythm.  ABDOMEN: Soft, mildly distended with no significant abdominal tenderness. He has recently had pain medicine. He has active bowel sounds. He does not have any obvious rebound or guarding.  EXTREMITIES: Lower extremity examination reveals some mild edema and no deformities.  PSYCHIATRIC: Normal orientation, normal affect.   ASSESSMENT AND PLAN: I independently reviewed his plain films. They are single views only, all supine or lateral with no upright views. The film is very difficult to interpret in that setting. At the present time, he has no obvious acute surgical indications. He continues to be on Plavix, would be a poor surgical candidate at this point. He has no nausea or vomiting and has been eating normally up through admission last night. At this point, I would suggest he probably has some constipation with some small bowel ileus as he does appear to have a significant stool burden in the right side. I would recommend repeating his films with an upright film in addition to his current x-rays and perhaps even consider a  CT scan if he develops any nausea or vomiting. I do not see surgical indications at the present time. A better stool regimen is probably the first step in  further therapy.    ____________________________ Micheline Maze, MD rle:TT D: 09/02/2014 18:56:28 ET T: 09/02/2014 20:46:47 ET JOB#: [id number]  cc: Rodena Goldmann III, MD, <Dictator> Guadalupe Maple, MD Lucilla Lame, MD  Rodena Goldmann MD ELECTRONICALLY SIGNED 09/10/2014 20:11

## 2014-12-16 NOTE — H&P (Signed)
PATIENT NAME:  Darrell [name], Darrell [name] MR#:  [id number] DATE OF BIRTH:  Apr 21, 1946  DATE OF ADMISSION:  09/02/2014  REFERRING PHYSICIAN: Gretchen Short. Beather Arbour, MD  PRIMARY CARE PHYSICIAN: Guadalupe Maple, MD  ADMITTING DIAGNOSIS: Chest pain.   HISTORY OF PRESENT ILLNESS: This is a 69 year old Caucasian male who presents to the Emergency Department complaining of chest pain. The patient states that the pain began approximately 24 hours ago. It awoke from sleep. The pain did not go away, yet he went about his business of that day. He denies any strenuous activity, although notably the night before the chest pain began, the patient had gone to the gym. He participated in a birthday party for his wife, and had some popcorn as well as other food. He felt some nausea after eating, but did not have a bowel movement. Notably, this is abnormal for him, as he usually has a bowel movement after every meal. He denies any associated shortness of breath, lightheadedness, or diaphoresis. Due to persistence of the pain, the patient presented to the Emergency Department. Due to his history of coronary artery disease, the Emergency Department called for admission.   REVIEW OF SYSTEMS:  CONSTITUTIONAL: The patient denies fever or weakness.  EYES: Denies blurred vision or inflammation.  EARS, NOSE, AND THROAT: Denies tinnitus or sore throat.  RESPIRATORY: Denies cough or shortness of breath.  CARDIOVASCULAR: Admits to chest pain, but denies orthopnea, palpitations, or paroxysmal nocturnal dyspnea.  GASTROINTESTINAL: Admits to some nausea, but denies vomiting, diarrhea, or abdominal pain.  GENITOURINARY: Denies dysuria, increased frequency, or hesitancy of urination.  ENDOCRINE: Denies polyuria or polydipsia. HEMATOLOGIC AND LYMPHATIC: Denies easy bruising or bleeding.  INTEGUMENTARY: Admits to psoriatic rash, but denies lesions.  MUSCULOSKELETAL: Denies arthralgias or myalgias.  NEUROLOGIC: Denies numbness in his extremities  or dysarthria.  PSYCHIATRIC: Denies depression or suicidal ideation.   PAST MEDICAL HISTORY: Coronary artery disease, prostate cancer status post treatment with high-frequency ultrasound, gout, and psoriasis.   PAST SURGICAL HISTORY: The patient had a left rotator cuff repair and excisional biopsy of carcinomas on his skin.   SOCIAL HISTORY: He quit smoking 27 years ago. He occasionally has a beer and he does not do any illicit drugs. He is married and lives with his wife.   FAMILY HISTORY: His brother has coronary artery disease and another brother has end-stage renal disease secondary to complications from diabetes mellitus. He has a sister who was a smoker who is deceased of esophageal cancer.   MEDICATIONS: 1.  Atorvastatin 40 mg 1 tab p.o. at bedtime.  2.  Hydralazine 25 mg 1 tab p.o. t.i.d.  3.  Hydrochlorothiazide 25 mg 1 tablet p.o. daily.  4.  Losartan 100 mg 1 tab p.o. daily.  5.  Metoprolol 25 mg 1 tab p.o. daily.  6.  Plavix 75 mg 1 tab p.o. daily.   ALLERGIES: No known drug allergies.  PERTINENT LABORATORY RESULTS AND RADIOGRAPHIC DATA: Serum glucose is 124, BUN 25, creatinine 1.3, serum sodium is 141, potassium is 3.7, chloride 106, bicarbonate is 28, calcium is 9.2. Troponin is negative. White blood cell count 9.2, hemoglobin 14.5, hematocrit 43.3, platelet count 157, MCV is 97.  Chest x-ray shows mild cardiomegaly with no acute cardiopulmonary process.    PHYSICAL EXAMINATION: VITAL SIGNS: Temperature is 98.2, pulse 62, respirations 18, blood pressure 187/90, pulse oximetry is 97% on room air.  GENERAL: The patient is alert and oriented x3, in no apparent distress.  HEENT: Normocephalic, atraumatic. Pupils equal, round, and  reactive to light and accommodation. Extraocular movements are intact. Mucous membranes are moist.  NECK: Trachea is midline. No adenopathy. Thyroid is nonpalpable and nontender.  CHEST: Symmetric and atraumatic.  CARDIOVASCULAR: Regular rate and  rhythm. Normal S1, S2. No rubs, clicks, or murmurs appreciated. The patient does not have any carotid bruits. He has 2+ radial pulses as well as dorsalis pedis pulses.  LUNGS: Clear to auscultation bilaterally. Normal effort and excursion.  ABDOMEN: Positive bowel sounds. Soft, nontender, but mildly distended and tympanic. There is no hepatosplenomegaly.  GENITOURINARY: Deferred.  MUSCULOSKELETAL: The patient moves all 4 extremities equally.  SKIN: Warm and dry. The patient has multiple actinic keratoses as well as psoriatic lesions.  EXTREMITIES: No clubbing, cyanosis, or edema.  NEUROLOGIC: Cranial nerves II through XII are grossly intact.  PSYCHIATRIC: Mood is normal. Affect is congruent. The patient has excellent judgment and insight into his illness.  ASSESSMENT AND PLAN: This is a 69 year old male admitted for chest pain.  1.  Chest pain, atypical for acute coronary syndrome, as the pain has lasted for 24 hours. There are no alleviating or exacerbating factors. The patient denies shortness of breath, diaphoresis, and lightheadedness that may usually be associated with a heart attack. He admits to some nausea, but that has resolved at this time. I think this may be gastrointestinal related, as the patient usually has a very hyperactive bowel pattern, but has not been able have a bowel movement since the pain began. This may suggest that he has either constipation or acid reflux. I have ordered an abdominal film as well as antireflux medication. Nonetheless, we will cycle his cardiac enzymes and I have ordered a cardiology consult due to his history of coronary artery disease status post stent placement.  2.  Coronary artery disease. Continue Plavix and statin for secondary risk reduction.  3.  Hypertension. Continue metoprolol, losartan, hydrochlorothiazide, and hydralazine.  4.  Prostate cancer. The patient has undergone high-frequency ultrasound treatment in the [place]. He should follow up with  his urologist as an outpatient for this.  5.  Gout. Continue allopurinol; however, the dose is not specified at this time.  6.  Obesity. The patient's BMI is 32.7. I have encouraged balanced diet and age appropriate exercise.  7.  Deep vein thrombosis prophylaxis. Heparin.  8.  Gastrointestinal prophylaxis. H2-blocker b.i.d.  CODE STATUS: THE PATIENT IS A FULL CODE.  TIME SPENT ON ADMISSION ORDERS AND PATIENT CARE: Approximately 35 minutes.   ____________________________ Norva Riffle. Marcille Blanco, MD msd:sw D: 09/02/2014 05:33:53 ET T: 09/02/2014 07:12:05 ET JOB#: [id number]  cc: Norva Riffle. Marcille Blanco, MD, <Dictator> Norva Riffle Nyles Mitton MD ELECTRONICALLY SIGNED 09/11/2014 2:47

## 2014-12-16 NOTE — Consult Note (Signed)
PATIENT NAME:  Darrell [name], Darrell [name] MR#:  [id number] DATE OF BIRTH:  09/10/1945  DATE OF CONSULTATION:  09/02/2014  REQUESTING PHYSICIAN: Norva Riffle. Marcille Blanco, MD    CONSULTING PHYSICIAN:  Corey Skains, MD  REASON FOR CONSULTATION: Known coronary artery disease, hypertension, hyperlipidemia with chest pain.  HISTORY OF PRESENT ILLNESS: This is a 69 year old male with known coronary artery disease status post coronary artery stenting in 2011 with mixed hyperlipidemia and essential hypertension, well controlled on appropriate medication management, having new onset of dull abdominal discomfort, radiating into chest causing some mild shortness of breath but no evidence of diaphoresis or other issues. He does have an EKG showing normal sinus rhythm with preventricular contractions and nonspecific ST changes. In addition to that, the patient has a normal troponin. He also had nitroglycerin, which did not help his symptoms whatsoever and currently is stable without any distress. The patient had more abdominal pain at this time.   REVIEW OF SYSTEMS: The remainder of review of systems negative for vision change, ringing in the ears, hearing loss, cough, congestion, heartburn, nausea, vomiting, diarrhea, bloody stools. Positive for stomach pain, extremity pain. Negative for syncope, dizziness, diaphoresis, skin lesions or skin rashes.   PAST MEDICAL HISTORY:  1. Known coronary disease status post previous stenting.  2. Mixed hyperlipidemia.  3. Essential hypertension.   FAMILY HISTORY: Father had hypertension, but no evidence of early onset of cardiovascular disease.   SOCIAL HISTORY: Currently denies alcohol or tobacco use.   ALLERGIES: As listed.   MEDICATIONS: As listed.   PHYSICAL EXAMINATION: VITAL SIGNS: Blood pressure 136/62 bilaterally. Heart rate 72 upright, reclining and regular.   GENERAL: He is a well appearing male in no acute distress.   HEENT: No icterus, thyromegaly, ulcers,  hemorrhage or xanthelasma.   CARDIOVASCULAR: Regular rate and rhythm. Normal S1 and S2. Distant heart sounds. PMI is diffuse.   NECK: Carotid upstroke normal without bruit. Jugular per pressure is normal.   LUNGS: Clear to auscultation with normal respirations.   ABDOMEN: Soft with some mild amount of tenderness. Abdominal aorta cannot be felt or heard due to increased abdominal girth.   EXTREMITIES: Show 2+ radial, femoral and dorsal pedal pulses with no lower extremity edema, cyanosis, clubbing or ulcers.   NEUROLOGIC: He is oriented to time, place and person with normal mood and affect.   ASSESSMENT: This is a 69 year old male with coronary artery disease, mixed hyperlipidemia and essential hypertension with chest discomfort and nonspecific ST changes by EKG with normal troponin, atypical in nature without evidence of myocardial infarction.   RECOMMENDATIONS: 1. Continue further evaluation and observation for abdominal discomfort and cause of significant symptoms.  2. Begin ambulation and follow for any further significant symptoms.  3. Serial ECG and enzymes to assess for changes or myocardial infarction.  4. Continue treatment for coronary artery disease in the past and essential hypertension with beta blocker and ACE inhibitor if able.  5. Statin therapy for mixed hyperlipidemia with high intensity cholesterol therapy.  6. No further cardiac diagnostics necessary at this time and if the patient has a normal troponin and improvements this afternoon, may consider discharge to home with outpatient evaluation.      ____________________________ Corey Skains, MD bjk:TT D: 09/02/2014 07:54:49 ET T: 09/02/2014 13:04:44 ET JOB#: [id number]  cc: Corey Skains, MD, <Dictator> Norva Riffle. Marcille Blanco, MD Corey Skains MD ELECTRONICALLY SIGNED 09/03/2014 13:29

## 2014-12-16 NOTE — Discharge Summary (Signed)
PATIENT NAME:  Darrell [name], Darrell [name] MR#:  [id number] DATE OF BIRTH:  [birth year]  ADMITTING DIAGNOSES: 1.  Chest pain, atypical for acute coronary syndrome.  2.  Coronary artery disease.  3.  Hypertension.  4.  Prostate cancer.  5.  Gout.  6.  Obesity.   DISCHARGE DIAGNOSES: 1. Chest pain, ruled out acute coronary syndrome. Outpatient followup with cardiology is recommended.  2.  Ileus, resolved.  3.  Right lower lobe pneumonia. Continue levofloxacin.  4.  Acute cholecystitis.  The patient's Plavix is held. Follow up with surgery as an outpatient in 5-7 days.   CONSULTATIONS: Cardiology, Corey Skains, MD; surgery, Consuela Mimes, MD.  PROCEDURES: None.   BRIEF HISTORY AND PHYSICAL AND HOSPITAL COURSE: The patient is a 69 year old Caucasian male who came into the ED with a chief complaint of chest pain. The patient has a history of coronary artery disease. Initial troponin is negative. The patient was admitted under observation status. Cardiology consultation placed. Cardiac biomarkers were recycled. Please review history and physical for details.  1.  Chest pain. Acute MI is ruled out. No new EKG changes, no further cardiology recommendations. They had recommended outpatient followup with them. 2.   The patient was complaining of right pleuritic pain. It was assumed to be from right-sided pneumonia. The patient is started on IV levofloxacin.  Plan is to continue levofloxacin. 3.  Right upper quadrant abdominal pain, possible acute cholecystitis was noted on the CT abdomen.  Abdominal ultrasound has revealed cholelithiasis with gallbladder wall thickening and positive sonographic Murphy sign suggesting acute cholecystitis. The patient's Plavix was held and surgical consult was placed to Dr. Leanora Cover, who has recommended outpatient followup at this point. The patient's right-sided abdominal pain is significantly improved. Dr. Leanora Cover  has recommended to continue holding Plavix and  outpatient followup with him as an outpatient for elective cholecystectomy in next 5-7 days. 4.  Ileus. The patient was given laxatives and clinically monitored with serial KUBs. The patient had a large bowel movement yesterday and today 2 more bowel movements. He is started on clear liquid diet and he was advanced to low fat diet which the patient tolerated very well.    As patient is clinically stable and abdominal pain is tolerable, and he is tolerating diet, decision is  made to discharge the patient home.  CONDITION AT TIME OF DISCHARGE: Stable.   ACTIVITY: As tolerated. No strenuous work.   SIGNIFICANT LABORATORY DATA AND IMAGING STUDIES:  KUB today when compared to the previous study, revealed gaseous distention of the right colon and transverse colon, probable colonic ileus, nonobstructive small bowel gas pattern. Ultrasound of the abdomen on January 19, cholelithiasis with gallbladder wall thickening and positive sonographic Murphy sign suggesting acute cholecystitis.  CAT scan of the abdomen and pelvis with contrast performed on January 18: (1) Patchy, somewhat nodular infiltrate in the right lower lobe laterally. Patchy pneumonia cannot be excluded; (2) abnormal thickening of the gallbladder wall and subtle stranding of the pericholecystic fat; (3) extensive atherosclerotic vascular calcifications of the abdominal aorta, iliac arteries, and superior mesenteric artery; (4) mild distended small bowel loops in distal abdomen with some air-fluid levels; (5) moderately distended urinary bladder; (6) scattered diverticula descending colon.  BMP: Creatinine is slightly elevated at 1.36. Sodium is at 135; the rest of the BMP is normal. WBC 8.4, hemoglobin 11.8, hematocrit 34.6, platelets are 106,000.   DISCHARGE MEDICATIONS: Zofran 4 mg p.o. q. 6 hours as needed for nausea and vomiting, levofloxacin 750  mg 1 tablet p.o. once daily for 1 week, Flagyl 500 mg p.o. q. 8 hours for 1 more week, oxycodone 5  mg p.o. q. 6 hours as needed for pain, Colace with senna 50/8.6, 2 tablets p.o. 2 times a day as needed for constipation, allopurinol 300 mg p.o. once daily, metoprolol tartrate 25 mg p.o. b.i.d., hydralazine 25 mg 1 tablet p.o. 3 times a day, atorvastatin 40 mg 1 tablet p.o. at bedtime, losartan 100 mg p.o. once daily, hydrochlorothiazide 25 mg 1 tablet p.o. once daily. Discontinue taking Plavix and aspirin until evaluated by surgery.   DIET:  Low fat, low cholesterol diet, regular consistency of the diet.   CODE STATUS:  Full code.  ACTIVITY: As tolerated. No strenuous exercise.  FOLLOWUP:  With PCP in 1-2 days, cardiology in 4-6 weeks, Dr. [name], surgery, Dr. Leanora Cover in 5-7 days.   The diagnosis and plan of care was discussed in detail with the patient. He is aware of the plan. This was discussed with his wife as well.  TIME SPENT ON THE DISCHARGE: 45 minutes.   ____________________________ Nicholes Mango, MD ag:LT D: 09/05/2014 15:24:00 ET T: 09/05/2014 19:30:30 ET JOB#: [id number]  cc: Nicholes Mango, MD, <Dictator> Corey Skains, MD Consuela Mimes, MD Nicholes Mango MD ELECTRONICALLY SIGNED 09/07/2014 18:57

## 2014-12-16 NOTE — Op Note (Signed)
PATIENT NAME:  Darrell [name], Darrell [name] MR#:  [id number] DATE OF BIRTH:  1945-08-28  DATE OF PROCEDURE:  10/08/2014  PREOPERATIVE DIAGNOSIS: Chronic active cholecystitis.   POSTOPERATIVE DIAGNOSIS:  Chronic active cholecystitis.   SURGEON: Consuela Mimes, M.D.   ANESTHESIA: General.   OPERATION PERFORMED: Laparoscopic cholecystectomy.   PROCEDURE IN DETAIL: The patient was placed supine on the Operating Room table and prepped and draped in the usual sterile fashion. A 15 mmHg CO2 pneumoperitoneum was created via a Veress needle in the infraumbilical position and this was replaced with a 5 mm trocar and a 30 degree angled laparoscope. The remaining trocars were placed under direct visualization. The patient had dense adhesions from the gallbladder and the surrounding omentum to the anterior abdominal wall and these were taken down with the electrocautery. The adhesions from the omentum to the gallbladder itself were similarly taken down with electrocautery and bluntly and hemostasis was excellent at the end of that dissection. The fundus of the gallbladder was retracted superiorly and ventrally and the infundibulum of the gallbladder was dissected out with the electrocautery and retracted laterally to open up the triangle of Calot. The cystic duct emanated right from the tip of the infundibulum of the gallbladder and it was doubly clipped and divided initially and then 2 cystic arteries were found in the extremely dense adhesive tissue as I peeled the gallbladder out of this rind with right left-sided lateral and ventral retraction. Each of these were controlled with hemoclips and there were multiple bleeders from the liver bed that were controlled with the electrocautery. The gallbladder was removed from the liver bed with the electrocautery, placed in an Endo Catch bag and extracted from the epigastric port. This port site fascia required enlargement due to the size of the large stone that was present.  The peritoneum was temporarily desufflated and the linea alba where the gallbladder had been removed was closed with a running 0 PDS suture extracorporeally. The peritoneum was reinsufflated and reinspected and the right upper quadrant was irrigated with copious amounts of warm normal saline. This was all suctioned clear. There was no evidence of bile staining, the clips were secure, and hemostasis was excellent. Therefore, the peritoneum was desufflated and decannulated and all 4 skin sites were closed with subcuticular 5-0 Monocryl and suture strips.   ___________________________ Consuela Mimes, MD wfm:mc D: 10/08/2014 10:55:57 ET T: 10/08/2014 11:17:23 ET JOB#: [id number]  cc: Consuela Mimes, MD, <Dictator> Guadalupe Maple, MD Consuela Mimes MD ELECTRONICALLY SIGNED 10/08/2014 16:39

## 2015-01-22 DIAGNOSIS — D044 Carcinoma in situ of skin of scalp and neck: Secondary | ICD-10-CM | POA: Diagnosis not present

## 2015-01-22 DIAGNOSIS — L400 Psoriasis vulgaris: Secondary | ICD-10-CM | POA: Diagnosis not present

## 2015-01-22 DIAGNOSIS — D034 Melanoma in situ of scalp and neck: Secondary | ICD-10-CM | POA: Diagnosis not present

## 2015-01-22 DIAGNOSIS — D485 Neoplasm of uncertain behavior of skin: Secondary | ICD-10-CM | POA: Diagnosis not present

## 2015-01-22 DIAGNOSIS — Z85828 Personal history of other malignant neoplasm of skin: Secondary | ICD-10-CM | POA: Diagnosis not present

## 2015-02-21 DIAGNOSIS — B354 Tinea corporis: Secondary | ICD-10-CM | POA: Diagnosis not present

## 2015-03-11 DIAGNOSIS — L578 Other skin changes due to chronic exposure to nonionizing radiation: Secondary | ICD-10-CM | POA: Diagnosis not present

## 2015-03-11 DIAGNOSIS — D034 Melanoma in situ of scalp and neck: Secondary | ICD-10-CM | POA: Diagnosis not present

## 2015-03-11 DIAGNOSIS — L908 Other atrophic disorders of skin: Secondary | ICD-10-CM | POA: Diagnosis not present

## 2015-03-11 DIAGNOSIS — L814 Other melanin hyperpigmentation: Secondary | ICD-10-CM | POA: Diagnosis not present

## 2015-03-13 DIAGNOSIS — I10 Essential (primary) hypertension: Secondary | ICD-10-CM | POA: Insufficient documentation

## 2015-03-20 DIAGNOSIS — I2510 Atherosclerotic heart disease of native coronary artery without angina pectoris: Secondary | ICD-10-CM | POA: Diagnosis not present

## 2015-03-20 DIAGNOSIS — E782 Mixed hyperlipidemia: Secondary | ICD-10-CM | POA: Diagnosis not present

## 2015-03-20 DIAGNOSIS — I255 Ischemic cardiomyopathy: Secondary | ICD-10-CM | POA: Diagnosis not present

## 2015-03-25 DIAGNOSIS — B354 Tinea corporis: Secondary | ICD-10-CM | POA: Diagnosis not present

## 2015-03-25 DIAGNOSIS — Z85828 Personal history of other malignant neoplasm of skin: Secondary | ICD-10-CM | POA: Diagnosis not present

## 2015-03-25 DIAGNOSIS — L400 Psoriasis vulgaris: Secondary | ICD-10-CM | POA: Diagnosis not present

## 2015-04-23 DIAGNOSIS — L400 Psoriasis vulgaris: Secondary | ICD-10-CM | POA: Diagnosis not present

## 2015-04-23 DIAGNOSIS — N5201 Erectile dysfunction due to arterial insufficiency: Secondary | ICD-10-CM | POA: Diagnosis not present

## 2015-04-23 DIAGNOSIS — D400 Neoplasm of uncertain behavior of prostate: Secondary | ICD-10-CM | POA: Diagnosis not present

## 2015-05-08 DIAGNOSIS — I10 Essential (primary) hypertension: Secondary | ICD-10-CM | POA: Insufficient documentation

## 2015-05-08 DIAGNOSIS — E785 Hyperlipidemia, unspecified: Secondary | ICD-10-CM | POA: Insufficient documentation

## 2015-05-09 ENCOUNTER — Ambulatory Visit (INDEPENDENT_AMBULATORY_CARE_PROVIDER_SITE_OTHER): Payer: Medicare Other | Admitting: Family Medicine

## 2015-05-09 ENCOUNTER — Encounter: Payer: Self-pay | Admitting: Family Medicine

## 2015-05-09 VITALS — BP 121/69 | HR 77 | Temp 97.8°F | Ht 67.7 in | Wt 232.0 lb

## 2015-05-09 DIAGNOSIS — L409 Psoriasis, unspecified: Secondary | ICD-10-CM

## 2015-05-09 DIAGNOSIS — I2510 Atherosclerotic heart disease of native coronary artery without angina pectoris: Secondary | ICD-10-CM | POA: Insufficient documentation

## 2015-05-09 DIAGNOSIS — I25111 Atherosclerotic heart disease of native coronary artery with angina pectoris with documented spasm: Secondary | ICD-10-CM | POA: Diagnosis not present

## 2015-05-09 DIAGNOSIS — Z0000 Encounter for general adult medical examination without abnormal findings: Secondary | ICD-10-CM

## 2015-05-09 DIAGNOSIS — G4730 Sleep apnea, unspecified: Secondary | ICD-10-CM | POA: Insufficient documentation

## 2015-05-09 DIAGNOSIS — I10 Essential (primary) hypertension: Secondary | ICD-10-CM

## 2015-05-09 LAB — URINALYSIS, ROUTINE W REFLEX MICROSCOPIC
BILIRUBIN UA: NEGATIVE
GLUCOSE, UA: NEGATIVE
KETONES UA: NEGATIVE
Leukocytes, UA: NEGATIVE
Nitrite, UA: NEGATIVE
PROTEIN UA: NEGATIVE
RBC, UA: NEGATIVE
Specific Gravity, UA: 1.005 (ref 1.005–1.030)
UUROB: 0.2 mg/dL (ref 0.2–1.0)
pH, UA: 5.5 (ref 5.0–7.5)

## 2015-05-09 MED ORDER — ALLOPURINOL 300 MG PO TABS: 300.0000 mg | ORAL_TABLET | Freq: Every day | ORAL | Status: DC

## 2015-05-09 MED ORDER — LOSARTAN POTASSIUM 100 MG PO TABS: 100.0000 mg | ORAL_TABLET | Freq: Every day | ORAL | Status: DC

## 2015-05-09 MED ORDER — HYDROCHLOROTHIAZIDE 25 MG PO TABS: 25.0000 mg | ORAL_TABLET | Freq: Every day | ORAL | Status: DC

## 2015-05-09 MED ORDER — ATORVASTATIN CALCIUM 40 MG PO TABS: 40.0000 mg | ORAL_TABLET | Freq: Every day | ORAL | Status: DC

## 2015-05-09 MED ORDER — HYDRALAZINE HCL 25 MG PO TABS: 25.0000 mg | ORAL_TABLET | Freq: Three times a day (TID) | ORAL | Status: DC

## 2015-05-09 MED ORDER — METOPROLOL SUCCINATE ER 25 MG PO TB24: 25.0000 mg | ORAL_TABLET | Freq: Every day | ORAL | Status: DC

## 2015-05-09 NOTE — Assessment & Plan Note (Signed)
The current medical regimen is effective;  continue present plan and medications.  

## 2015-05-09 NOTE — Assessment & Plan Note (Signed)
Sleep apnea symptoms will order sleep study

## 2015-05-09 NOTE — Progress Notes (Signed)
BP 121/69 mmHg  Pulse 77  Temp(Src) 97.8 F (36.6 C)  Ht 5' 7.7" (1.72 m)  Wt 232 lb (105.235 kg)  BMI 35.57 kg/m2  SpO2 98%   Subjective:    Patient ID: Darrell [name], male    DOB: July 25, 1946, 69 y.o.   MRN: [id number]  HPI: Darrell [name] is a 69 y.o. male  Chief Complaint  Patient presents with  . Annual Exam   patient follow-up annual check just had prostate check from urology has been doing well PSA was also drawn.  Patient's medical conditions are stable no gout symptoms cholesterol blood pressure doing well Taking new psoriasis medicine from dermatology seems to be doing well with that. Taking medication faithfully without side effects No cardiovascular symptoms of angina chest pain chest tightness taking Plavix  Patient does note a when taking a nap or will wake up with a start and trying to catch his breath wife complaints of heavy snoring also some observed apneic spells. Patient does not have headaches but does wake up with dry mouth  Relevant past medical, surgical, family and social history reviewed and updated as indicated. Interim medical history since our last visit reviewed. Allergies and medications reviewed and updated.  Review of Systems  Constitutional: Negative.   HENT: Negative.   Eyes: Negative.   Respiratory: Negative.   Cardiovascular: Negative.   Gastrointestinal: Negative.   Endocrine: Negative.   Genitourinary: Negative.   Musculoskeletal: Negative.   Skin: Negative.   Allergic/Immunologic: Negative.   Neurological: Negative.   Hematological: Negative.   Psychiatric/Behavioral: Negative.     Per HPI unless specifically indicated above     Objective:    BP 121/69 mmHg  Pulse 77  Temp(Src) 97.8 F (36.6 C)  Ht 5' 7.7" (1.72 m)  Wt 232 lb (105.235 kg)  BMI 35.57 kg/m2  SpO2 98%  Wt Readings from Last 3 Encounters:  05/09/15 232 lb (105.235 kg)  10/02/14 216 lb (97.977 kg)    Physical Exam  Constitutional: He is  oriented to person, place, and time. He appears well-developed and well-nourished.  HENT:  Head: Normocephalic.  Right Ear: External ear normal.  Left Ear: External ear normal.  Nose: Nose normal.  Eyes: Conjunctivae and EOM are normal. Pupils are equal, round, and reactive to light.  Neck: Normal range of motion. Neck supple. No thyromegaly present.  Cardiovascular: Normal rate, regular rhythm, normal heart sounds and intact distal pulses.   Pulmonary/Chest: Effort normal and breath sounds normal.  Abdominal: Soft. Bowel sounds are normal. There is no splenomegaly or hepatomegaly.  Genitourinary: Penis normal.  Musculoskeletal: Normal range of motion.  Lymphadenopathy:    He has no cervical adenopathy.  Neurological: He is alert and oriented to person, place, and time. He has normal reflexes.  Skin: Skin is warm and dry.  Psychiatric: He has a normal mood and affect. His behavior is normal. Judgment and thought content normal.    Results for orders placed or performed in visit on 10/08/14  Surgical pathology  Result Value Ref Range   SURGICAL PATHOLOGY      Surgical Procedure CASE: [id number] PATIENT: Sheral Flow Surgical Pathology Report     SPECIMEN SUBMITTED: A. Gallbladder  CLINICAL HISTORY: None provided  PRE-OPERATIVE DIAGNOSIS: Acute cholecystitis  POST-OPERATIVE DIAGNOSIS: same/ laparoscopic cholecystectomy     DIAGNOSIS: A. GALLBLADDER; LAPAROSCOPIC CHOLECYSTECTOMY: - ACUTE ON CHRONIC CHOLECYSTITIS WITH CHOLELITHIASIS. - NEGATIVE FOR MALIGNANCY.   GROSS DESCRIPTION:  A. Measurements: 7.6 x 3.5 x 2.6 cm  Previously opened: Focally External surface: Shaggy inked Tan pink Wall thickness: 0.6 cm Mucosa: Granular red to green Stones present: 1 ovoid granular green, 2.6 x 2.2 x 2.1 cm Other findings: Cystic duct margin inked blue  Block summary: 1 - representative sections   Final Diagnosis performed by Quay Burow, MD.  Electronically  signed 10/10/2014 10:29:40AM    The electronic signature indicates that the named Attending Pathologist has evaluated the specimen  Technical comp onent performed at [facility], [address] [place], [place] [address] Lab: [phone] Dir: Darrick Penna. Evette Doffing, MD  Professional component performed at [facility], [facility], [place], [place], [place] [address] Lab: [phone] Dir: Dellia Nims. Reuel Derby, MD        Assessment & Plan:   Problem List Items Addressed This Visit    None       Follow up plan: No Follow-up on file.

## 2015-05-09 NOTE — Assessment & Plan Note (Signed)
Followed by dermatology

## 2015-05-09 NOTE — Assessment & Plan Note (Signed)
By cardiology

## 2015-05-10 LAB — CBC WITH DIFFERENTIAL/PLATELET
BASOS ABS: 0 10*3/uL (ref 0.0–0.2)
Basos: 0 %
EOS (ABSOLUTE): 0.2 10*3/uL (ref 0.0–0.4)
Eos: 3 %
HEMOGLOBIN: 14.4 g/dL (ref 12.6–17.7)
Hematocrit: 42.8 % (ref 37.5–51.0)
Immature Grans (Abs): 0 10*3/uL (ref 0.0–0.1)
Immature Granulocytes: 0 %
LYMPHS ABS: 2.2 10*3/uL (ref 0.7–3.1)
Lymphs: 28 %
MCH: 32.3 pg (ref 26.6–33.0)
MCHC: 33.6 g/dL (ref 31.5–35.7)
MCV: 96 fL (ref 79–97)
MONOCYTES: 9 %
MONOS ABS: 0.7 10*3/uL (ref 0.1–0.9)
NEUTROS ABS: 4.8 10*3/uL (ref 1.4–7.0)
Neutrophils: 60 %
Platelets: 169 10*3/uL (ref 150–379)
RBC: 4.46 x10E6/uL (ref 4.14–5.80)
RDW: 14 % (ref 12.3–15.4)
WBC: 7.9 10*3/uL (ref 3.4–10.8)

## 2015-05-10 LAB — COMPREHENSIVE METABOLIC PANEL
ALT: 30 IU/L (ref 0–44)
AST: 30 IU/L (ref 0–40)
Albumin/Globulin Ratio: 1.7 (ref 1.1–2.5)
Albumin: 4.2 g/dL (ref 3.6–4.8)
Alkaline Phosphatase: 128 IU/L — ABNORMAL HIGH (ref 39–117)
BUN/Creatinine Ratio: 20 (ref 10–22)
BUN: 25 mg/dL (ref 8–27)
Bilirubin Total: 0.9 mg/dL (ref 0.0–1.2)
CALCIUM: 9.9 mg/dL (ref 8.6–10.2)
CO2: 26 mmol/L (ref 18–29)
CREATININE: 1.26 mg/dL (ref 0.76–1.27)
Chloride: 99 mmol/L (ref 97–108)
GFR, EST AFRICAN AMERICAN: 68 mL/min/{1.73_m2} (ref >59)
GFR, EST NON AFRICAN AMERICAN: 59 mL/min/{1.73_m2} — AB (ref >59)
Globulin, Total: 2.5 g/dL (ref 1.5–4.5)
Glucose: 97 mg/dL (ref 65–99)
Potassium: 3.8 mmol/L (ref 3.5–5.2)
Sodium: 139 mmol/L (ref 134–144)
TOTAL PROTEIN: 6.7 g/dL (ref 6.0–8.5)

## 2015-05-10 LAB — LIPID PANEL
CHOL/HDL RATIO: 3.4 ratio (ref 0.0–5.0)
Cholesterol, Total: 148 mg/dL (ref 100–199)
HDL: 44 mg/dL (ref >39)
LDL CALC: 51 mg/dL (ref 0–99)
TRIGLYCERIDES: 263 mg/dL — AB (ref 0–149)
VLDL CHOLESTEROL CAL: 53 mg/dL — AB (ref 5–40)

## 2015-05-10 LAB — TSH: TSH: 3.26 u[IU]/mL (ref 0.450–4.500)

## 2015-05-13 ENCOUNTER — Encounter: Payer: Self-pay | Admitting: Family Medicine

## 2015-05-22 DIAGNOSIS — G4733 Obstructive sleep apnea (adult) (pediatric): Secondary | ICD-10-CM | POA: Diagnosis not present

## 2015-05-24 ENCOUNTER — Other Ambulatory Visit: Payer: Self-pay | Admitting: Family Medicine

## 2015-05-24 MED ORDER — ZOLPIDEM TARTRATE 10 MG PO TABS: 10.0000 mg | ORAL_TABLET | Freq: Every evening | ORAL | Status: DC | PRN

## 2015-06-08 DIAGNOSIS — G4733 Obstructive sleep apnea (adult) (pediatric): Secondary | ICD-10-CM | POA: Diagnosis not present

## 2015-06-11 DIAGNOSIS — I6529 Occlusion and stenosis of unspecified carotid artery: Secondary | ICD-10-CM | POA: Diagnosis not present

## 2015-06-11 DIAGNOSIS — I10 Essential (primary) hypertension: Secondary | ICD-10-CM | POA: Diagnosis not present

## 2015-06-12 ENCOUNTER — Telehealth: Payer: Self-pay

## 2015-06-17 NOTE — Telephone Encounter (Signed)
Pt would rather be called on cell than home number

## 2015-06-17 NOTE — Telephone Encounter (Signed)
Phone call Discussed sleep apnea and using CPAP Patient indicates he understands and will comply and her using his CPAP Order are descended 58

## 2015-07-03 ENCOUNTER — Other Ambulatory Visit: Payer: Self-pay | Admitting: Family Medicine

## 2015-07-23 DIAGNOSIS — X32XXXA Exposure to sunlight, initial encounter: Secondary | ICD-10-CM | POA: Diagnosis not present

## 2015-07-23 DIAGNOSIS — L400 Psoriasis vulgaris: Secondary | ICD-10-CM | POA: Diagnosis not present

## 2015-07-23 DIAGNOSIS — Z85828 Personal history of other malignant neoplasm of skin: Secondary | ICD-10-CM | POA: Diagnosis not present

## 2015-07-23 DIAGNOSIS — D225 Melanocytic nevi of trunk: Secondary | ICD-10-CM | POA: Diagnosis not present

## 2015-07-23 DIAGNOSIS — D2272 Melanocytic nevi of left lower limb, including hip: Secondary | ICD-10-CM | POA: Diagnosis not present

## 2015-07-23 DIAGNOSIS — L570 Actinic keratosis: Secondary | ICD-10-CM | POA: Diagnosis not present

## 2015-08-09 DIAGNOSIS — Z23 Encounter for immunization: Secondary | ICD-10-CM | POA: Diagnosis not present

## 2015-09-19 DIAGNOSIS — I2510 Atherosclerotic heart disease of native coronary artery without angina pectoris: Secondary | ICD-10-CM | POA: Diagnosis not present

## 2015-09-19 DIAGNOSIS — I480 Paroxysmal atrial fibrillation: Secondary | ICD-10-CM | POA: Diagnosis not present

## 2015-09-19 DIAGNOSIS — E782 Mixed hyperlipidemia: Secondary | ICD-10-CM | POA: Diagnosis not present

## 2015-09-19 DIAGNOSIS — I10 Essential (primary) hypertension: Secondary | ICD-10-CM | POA: Diagnosis not present

## 2015-09-30 DIAGNOSIS — D485 Neoplasm of uncertain behavior of skin: Secondary | ICD-10-CM | POA: Diagnosis not present

## 2015-09-30 DIAGNOSIS — L570 Actinic keratosis: Secondary | ICD-10-CM | POA: Diagnosis not present

## 2015-09-30 DIAGNOSIS — L820 Inflamed seborrheic keratosis: Secondary | ICD-10-CM | POA: Diagnosis not present

## 2015-09-30 DIAGNOSIS — Z08 Encounter for follow-up examination after completed treatment for malignant neoplasm: Secondary | ICD-10-CM | POA: Diagnosis not present

## 2015-09-30 DIAGNOSIS — L400 Psoriasis vulgaris: Secondary | ICD-10-CM | POA: Diagnosis not present

## 2015-09-30 DIAGNOSIS — X32XXXA Exposure to sunlight, initial encounter: Secondary | ICD-10-CM | POA: Diagnosis not present

## 2015-09-30 DIAGNOSIS — Z85828 Personal history of other malignant neoplasm of skin: Secondary | ICD-10-CM | POA: Diagnosis not present

## 2015-09-30 DIAGNOSIS — D044 Carcinoma in situ of skin of scalp and neck: Secondary | ICD-10-CM | POA: Diagnosis not present

## 2015-10-04 DIAGNOSIS — Z5181 Encounter for therapeutic drug level monitoring: Secondary | ICD-10-CM | POA: Diagnosis not present

## 2015-10-04 DIAGNOSIS — L400 Psoriasis vulgaris: Secondary | ICD-10-CM | POA: Diagnosis not present

## 2015-10-09 DIAGNOSIS — E669 Obesity, unspecified: Secondary | ICD-10-CM | POA: Diagnosis not present

## 2015-10-09 DIAGNOSIS — G4733 Obstructive sleep apnea (adult) (pediatric): Secondary | ICD-10-CM | POA: Diagnosis not present

## 2015-10-30 DIAGNOSIS — D044 Carcinoma in situ of skin of scalp and neck: Secondary | ICD-10-CM | POA: Diagnosis not present

## 2015-11-02 DIAGNOSIS — G4733 Obstructive sleep apnea (adult) (pediatric): Secondary | ICD-10-CM | POA: Diagnosis not present

## 2015-11-07 ENCOUNTER — Ambulatory Visit: Payer: Medicare Other | Admitting: Family Medicine

## 2015-11-08 DIAGNOSIS — G4733 Obstructive sleep apnea (adult) (pediatric): Secondary | ICD-10-CM | POA: Diagnosis not present

## 2015-11-08 DIAGNOSIS — E669 Obesity, unspecified: Secondary | ICD-10-CM | POA: Diagnosis not present

## 2015-11-11 ENCOUNTER — Encounter: Payer: Self-pay | Admitting: Family Medicine

## 2015-11-11 ENCOUNTER — Ambulatory Visit (INDEPENDENT_AMBULATORY_CARE_PROVIDER_SITE_OTHER): Payer: Medicare Other | Admitting: Family Medicine

## 2015-11-11 VITALS — BP 126/73 | HR 54 | Temp 97.9°F | Ht 67.9 in | Wt 232.0 lb

## 2015-11-11 DIAGNOSIS — E785 Hyperlipidemia, unspecified: Secondary | ICD-10-CM | POA: Diagnosis not present

## 2015-11-11 DIAGNOSIS — I10 Essential (primary) hypertension: Secondary | ICD-10-CM

## 2015-11-11 DIAGNOSIS — I25111 Atherosclerotic heart disease of native coronary artery with angina pectoris with documented spasm: Secondary | ICD-10-CM

## 2015-11-11 DIAGNOSIS — G4730 Sleep apnea, unspecified: Secondary | ICD-10-CM | POA: Diagnosis not present

## 2015-11-11 LAB — LP+ALT+AST PICCOLO, WAIVED
ALT (SGPT) Piccolo, Waived: 34 U/L (ref 10–47)
AST (SGOT) Piccolo, Waived: 34 U/L (ref 11–38)
CHOL/HDL RATIO PICCOLO,WAIVE: 3 mg/dL
Cholesterol Piccolo, Waived: 128 mg/dL (ref <200)
HDL Chol Piccolo, Waived: 43 mg/dL — ABNORMAL LOW (ref >59)
LDL CHOL CALC PICCOLO WAIVED: 42 mg/dL (ref <100)
Triglycerides Piccolo,Waived: 216 mg/dL — ABNORMAL HIGH (ref <150)
VLDL Chol Calc Piccolo,Waive: 43 mg/dL — ABNORMAL HIGH (ref <30)

## 2015-11-11 NOTE — Assessment & Plan Note (Signed)
The current medical regimen is effective;  continue present plan and medications.  

## 2015-11-11 NOTE — Progress Notes (Signed)
BP 126/73 mmHg  Pulse 54  Temp(Src) 97.9 F (36.6 C)  Ht 5' 7.9" (1.725 m)  Wt 232 lb (105.235 kg)  BMI 35.37 kg/m2  SpO2 96%   Subjective:    Patient ID: Darrell [name], male    DOB: 10-May-1946, 70 y.o.   MRN: [id number]  HPI: Darrell [name] is a 70 y.o. male  Chief Complaint  Patient presents with  . Hypertension  . Hyperlipidemia   patient doing well blood pressure good control checks fired apartment with good readings. Cholesterol doing well no complaints from medications No gout symptoms no problems with allopurinol Sleep apnea doing well with new settings on CPAP machine sleeping better  Relevant past medical, surgical, family and social history reviewed and updated as indicated. Interim medical history since our last visit reviewed. Allergies and medications reviewed and updated.  Review of Systems  Constitutional: Negative.   Respiratory: Negative.   Cardiovascular: Negative.     Per HPI unless specifically indicated above     Objective:    BP 126/73 mmHg  Pulse 54  Temp(Src) 97.9 F (36.6 C)  Ht 5' 7.9" (1.725 m)  Wt 232 lb (105.235 kg)  BMI 35.37 kg/m2  SpO2 96%  Wt Readings from Last 3 Encounters:  11/11/15 232 lb (105.235 kg)  05/09/15 232 lb (105.235 kg)  10/02/14 216 lb (97.977 kg)    Physical Exam  Constitutional: He is oriented to person, place, and time. He appears well-developed and well-nourished. No distress.  HENT:  Head: Normocephalic and atraumatic.  Right Ear: Hearing normal.  Left Ear: Hearing normal.  Nose: Nose normal.  Eyes: Conjunctivae and lids are normal. Right eye exhibits no discharge. Left eye exhibits no discharge. No scleral icterus.  Cardiovascular: Normal rate, regular rhythm and normal heart sounds.   Pulmonary/Chest: Effort normal and breath sounds normal. No respiratory distress.  Musculoskeletal: Normal range of motion.  Neurological: He is alert and oriented to person, place, and time.  Skin: Skin is  intact. No rash noted.  Psychiatric: He has a normal mood and affect. His speech is normal and behavior is normal. Judgment and thought content normal. Cognition and memory are normal.    Results for orders placed or performed in visit on 05/09/15  Comprehensive metabolic panel  Result Value Ref Range   Glucose 97 65 - 99 mg/dL   BUN 25 8 - 27 mg/dL   Creatinine, Ser 1.26 0.76 - 1.27 mg/dL   GFR calc non Af Amer 59 (L) >59 mL/min/1.73   GFR calc Af Amer 68 >59 mL/min/1.73   BUN/Creatinine Ratio 20 10 - 22   Sodium 139 134 - 144 mmol/L   Potassium 3.8 3.5 - 5.2 mmol/L   Chloride 99 97 - 108 mmol/L   CO2 26 18 - 29 mmol/L   Calcium 9.9 8.6 - 10.2 mg/dL   Total Protein 6.7 6.0 - 8.5 g/dL   Albumin 4.2 3.6 - 4.8 g/dL   Globulin, Total 2.5 1.5 - 4.5 g/dL   Albumin/Globulin Ratio 1.7 1.1 - 2.5   Bilirubin Total 0.9 0.0 - 1.2 mg/dL   Alkaline Phosphatase 128 (H) 39 - 117 IU/L   AST 30 0 - 40 IU/L   ALT 30 0 - 44 IU/L  Lipid panel  Result Value Ref Range   Cholesterol, Total 148 100 - 199 mg/dL   Triglycerides 263 (H) 0 - 149 mg/dL   HDL 44 >39 mg/dL   VLDL Cholesterol Cal 53 (H) 5 -  40 mg/dL   LDL Calculated 51 0 - 99 mg/dL   Chol/HDL Ratio 3.4 0.0 - 5.0 ratio units  CBC with Differential/Platelet  Result Value Ref Range   WBC 7.9 3.4 - 10.8 x10E3/uL   RBC 4.46 4.14 - 5.80 x10E6/uL   Hemoglobin 14.4 12.6 - 17.7 g/dL   Hematocrit 42.8 37.5 - 51.0 %   MCV 96 79 - 97 fL   MCH 32.3 26.6 - 33.0 pg   MCHC 33.6 31.5 - 35.7 g/dL   RDW 14.0 12.3 - 15.4 %   Platelets 169 150 - 379 x10E3/uL   Neutrophils 60 %   Lymphs 28 %   Monocytes 9 %   Eos 3 %   Basos 0 %   Neutrophils Absolute 4.8 1.4 - 7.0 x10E3/uL   Lymphocytes Absolute 2.2 0.7 - 3.1 x10E3/uL   Monocytes Absolute 0.7 0.1 - 0.9 x10E3/uL   EOS (ABSOLUTE) 0.2 0.0 - 0.4 x10E3/uL   Basophils Absolute 0.0 0.0 - 0.2 x10E3/uL   Immature Granulocytes 0 %   Immature Grans (Abs) 0.0 0.0 - 0.1 x10E3/uL  Urinalysis, Routine w reflex  microscopic (not at [facility])  Result Value Ref Range   Specific Gravity, UA 1.005 1.005 - 1.030   pH, UA 5.5 5.0 - 7.5   Color, UA Yellow Yellow   Appearance Ur Clear Clear   Leukocytes, UA Negative Negative   Protein, UA Negative Negative/Trace   Glucose, UA Negative Negative   Ketones, UA Negative Negative   RBC, UA Negative Negative   Bilirubin, UA Negative Negative   Urobilinogen, Ur 0.2 0.2 - 1.0 mg/dL   Nitrite, UA Negative Negative  TSH  Result Value Ref Range   TSH 3.260 0.450 - 4.500 uIU/mL      Assessment & Plan:   Problem List Items Addressed This Visit      Cardiovascular and Mediastinum   Hypertension - Primary    The current medical regimen is effective;  continue present plan and medications.       Relevant Orders   LP+ALT+AST Piccolo, Waived   Basic metabolic panel   CAD (coronary artery disease)    The current medical regimen is effective;  continue present plan and medications.         Other   Hyperlipidemia    The current medical regimen is effective;  continue present plan and medications.       Relevant Orders   LP+ALT+AST Piccolo, Waived   Basic metabolic panel   Sleep apnea    The current medical regimen is effective;  continue present plan and medications.           Follow up plan: Return in about 6 months (around 05/13/2016) for Physical Exam uric acid.

## 2015-11-12 ENCOUNTER — Encounter: Payer: Self-pay | Admitting: Family Medicine

## 2015-11-12 LAB — BASIC METABOLIC PANEL
BUN/Creatinine Ratio: 15 (ref 10–22)
BUN: 19 mg/dL (ref 8–27)
CALCIUM: 9.5 mg/dL (ref 8.6–10.2)
CHLORIDE: 102 mmol/L (ref 96–106)
CO2: 24 mmol/L (ref 18–29)
Creatinine, Ser: 1.3 mg/dL — ABNORMAL HIGH (ref 0.76–1.27)
GFR, EST AFRICAN AMERICAN: 65 mL/min/{1.73_m2} (ref >59)
GFR, EST NON AFRICAN AMERICAN: 56 mL/min/{1.73_m2} — AB (ref >59)
Glucose: 106 mg/dL — ABNORMAL HIGH (ref 65–99)
POTASSIUM: 3.7 mmol/L (ref 3.5–5.2)
SODIUM: 139 mmol/L (ref 134–144)

## 2015-12-10 DIAGNOSIS — E785 Hyperlipidemia, unspecified: Secondary | ICD-10-CM | POA: Diagnosis not present

## 2015-12-10 DIAGNOSIS — I10 Essential (primary) hypertension: Secondary | ICD-10-CM | POA: Diagnosis not present

## 2015-12-10 DIAGNOSIS — I6523 Occlusion and stenosis of bilateral carotid arteries: Secondary | ICD-10-CM | POA: Diagnosis not present

## 2016-01-31 DIAGNOSIS — X32XXXA Exposure to sunlight, initial encounter: Secondary | ICD-10-CM | POA: Diagnosis not present

## 2016-01-31 DIAGNOSIS — Z85828 Personal history of other malignant neoplasm of skin: Secondary | ICD-10-CM | POA: Diagnosis not present

## 2016-01-31 DIAGNOSIS — L570 Actinic keratosis: Secondary | ICD-10-CM | POA: Diagnosis not present

## 2016-01-31 DIAGNOSIS — L400 Psoriasis vulgaris: Secondary | ICD-10-CM | POA: Diagnosis not present

## 2016-02-01 IMAGING — CR DG ABDOMEN 1V
1 series · 2 of 2 positions shown · non-contrast
Comparison: 09/02/2014

CLINICAL DATA: Right-sided abdominal pain, followup ileus

EXAM:
ABDOMEN - 1 VIEW

[Series 1: dxr kidney ureter bladder · 0.14mm/px · 2 of 2 slices shown]
[im 1/2]
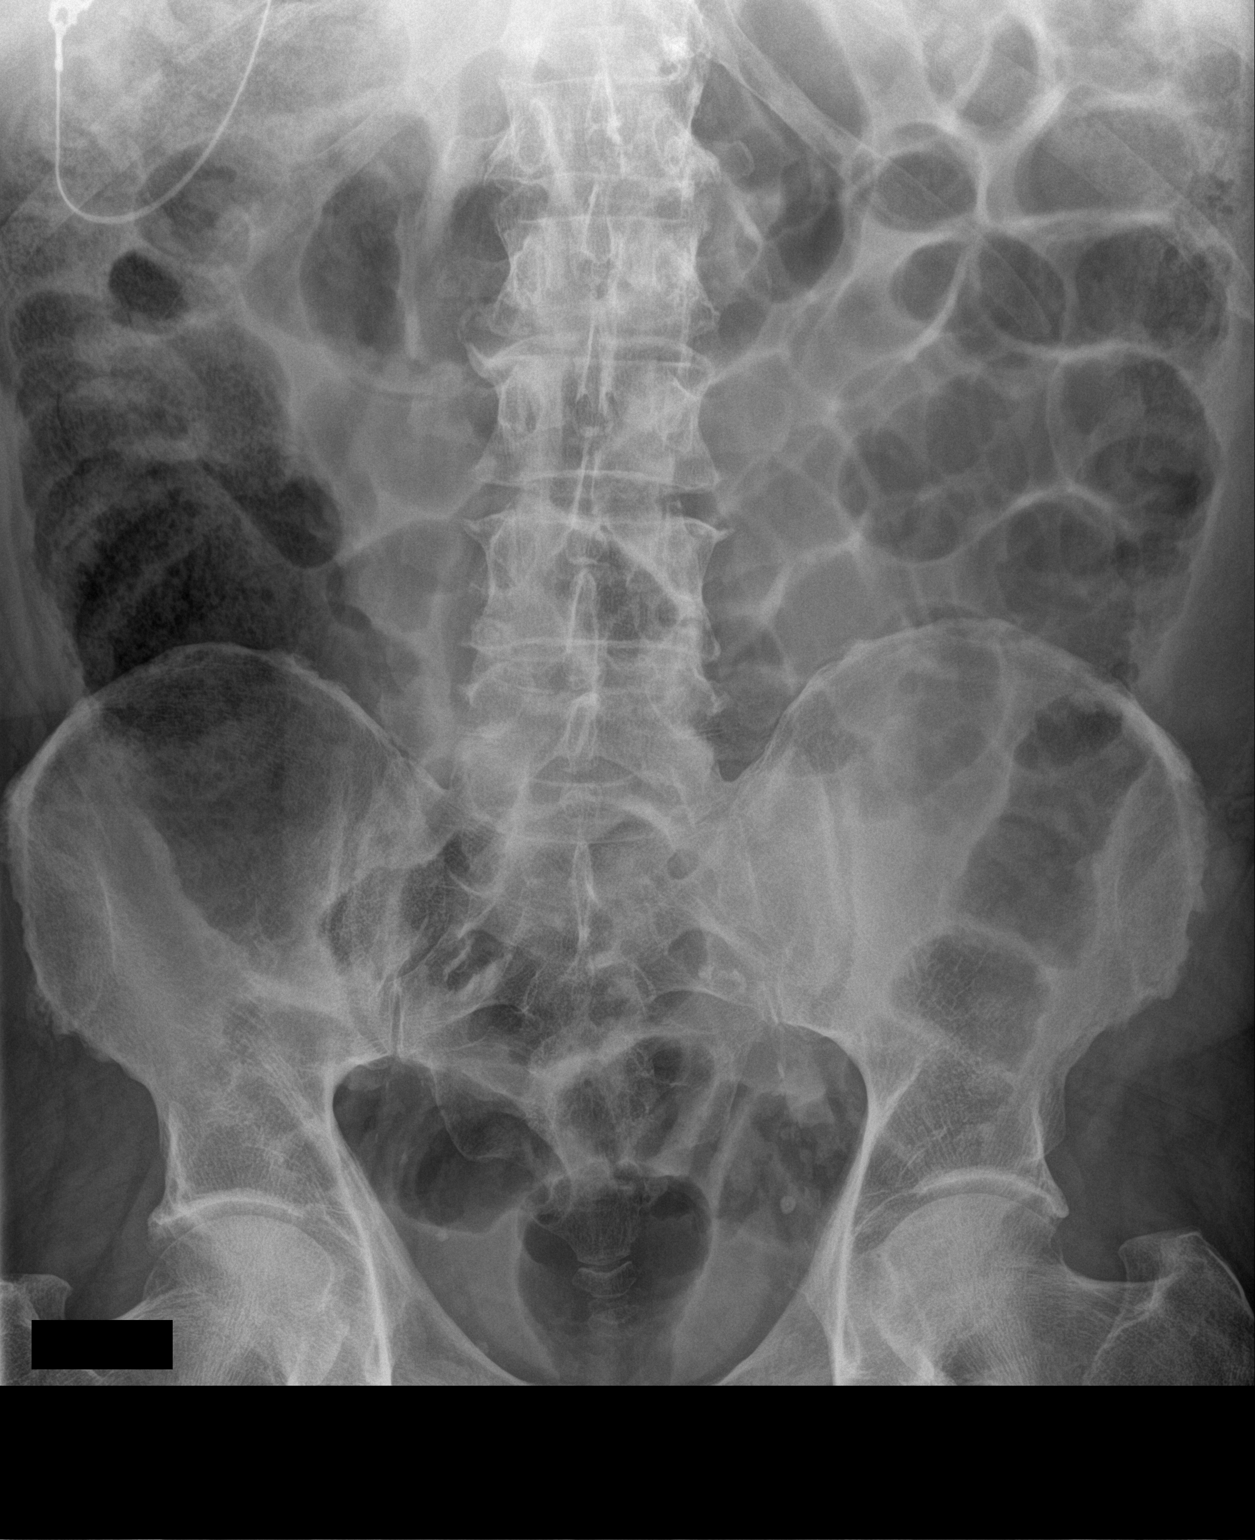
[im 2/2]
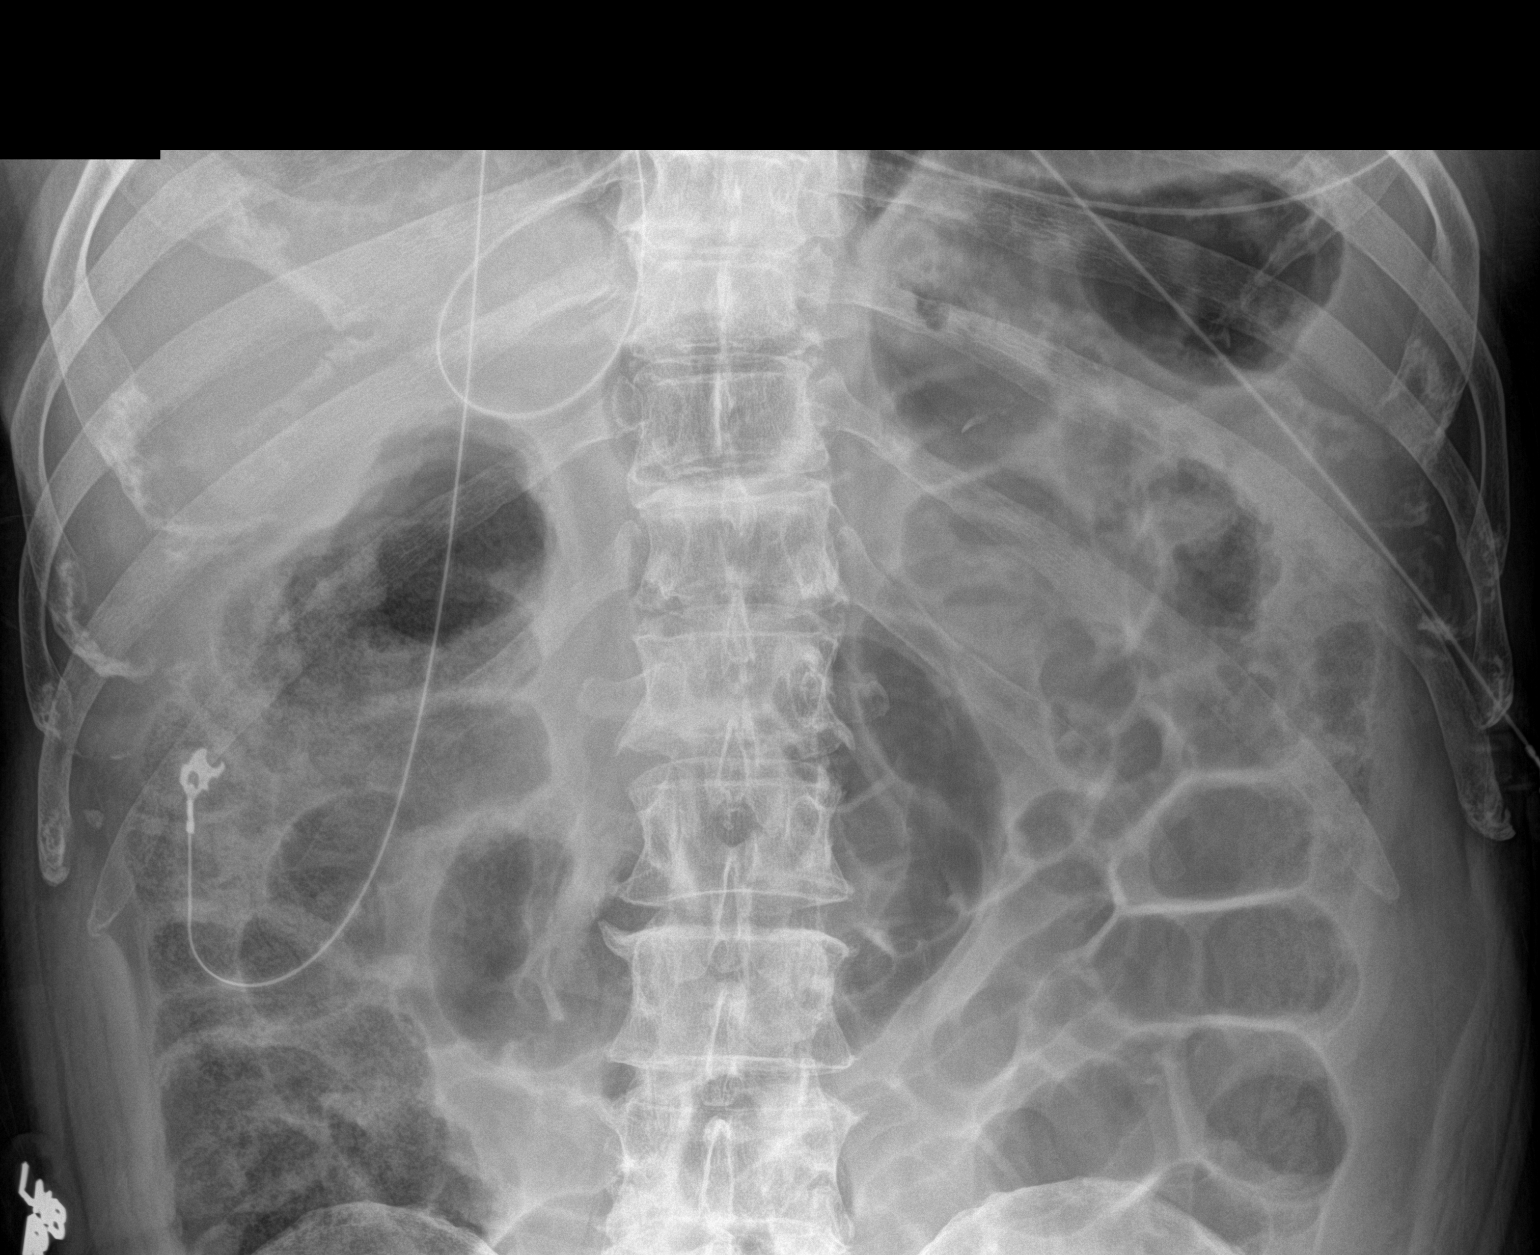

[2 of 2 positions shown; findings below may reference images not displayed]

FINDINGS: Scattered large and small bowel gas is noted. Increased small bowel
dilatation is noted when compared with the prior exam. No free air
is seen. No abnormal mass or abnormal calcifications are seen.
Degenerative change of the lumbar spine is noted.
IMPRESSION: Increasing small bowel dilatation consistent with progressive ileus.

## 2016-02-02 IMAGING — US ABDOMEN ULTRASOUND
1 series · 14 of 25 positions shown · non-contrast
Comparison: CT 09/03/2014

CLINICAL DATA: Abdominal pain, nausea and vomiting x4 days

EXAM:
COMPLETE ABDOMINAL ULTRASOUND

[Series 1: abdomen ultrasound · 0.31mm/px · 14 of 80 slices shown]
[im 1/80]
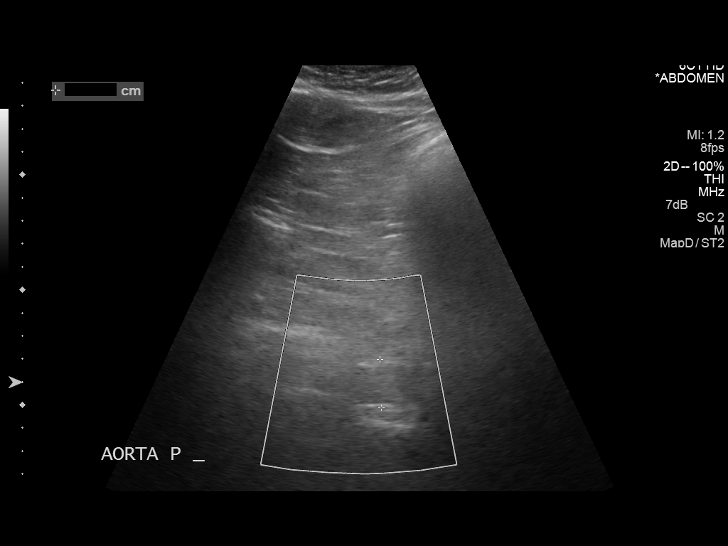
[im 7/80]
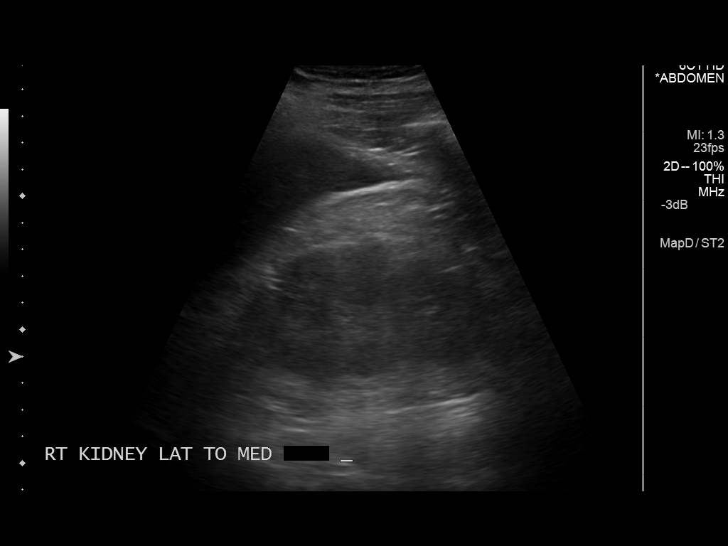
[im 14/80]
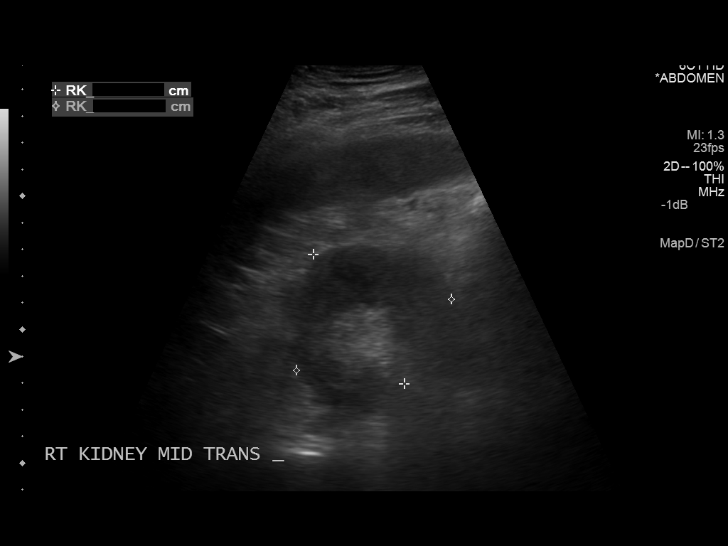
[im 20/80]
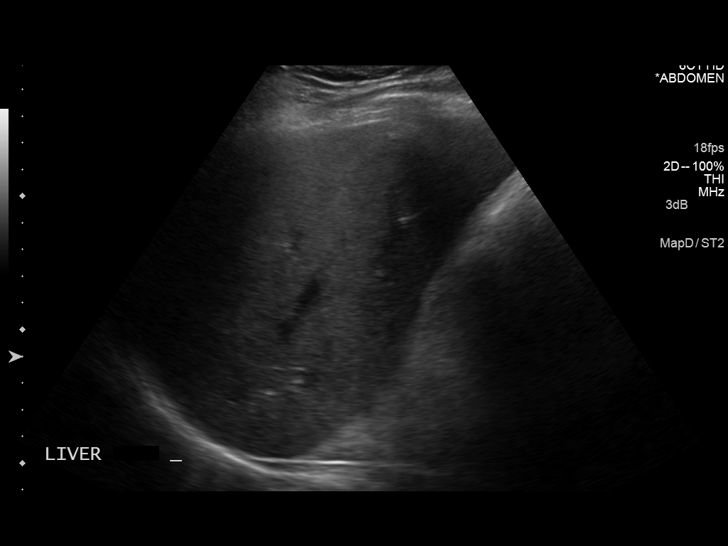
[im 27/80]
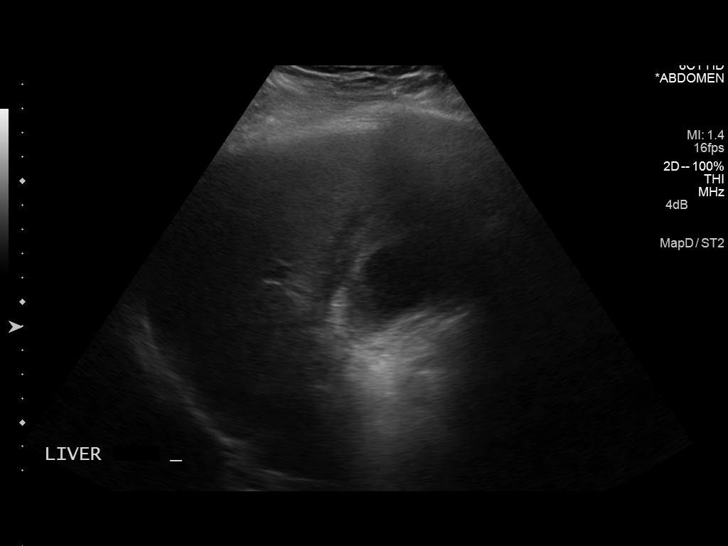
[im 30/80]
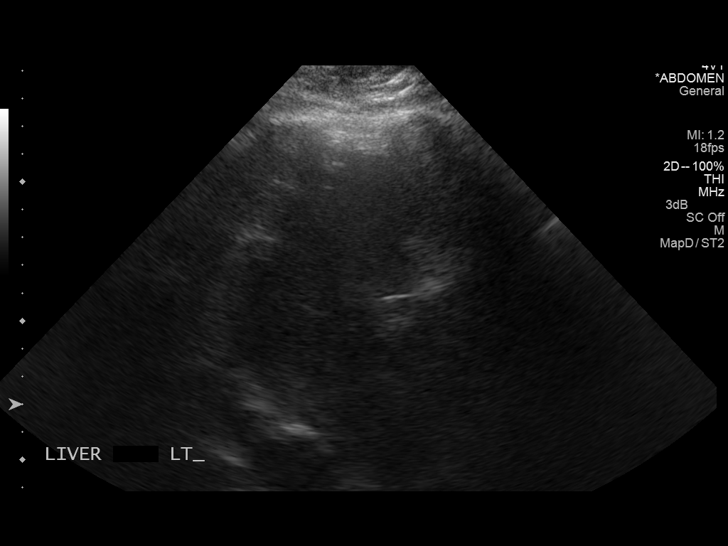
[im 37/80]
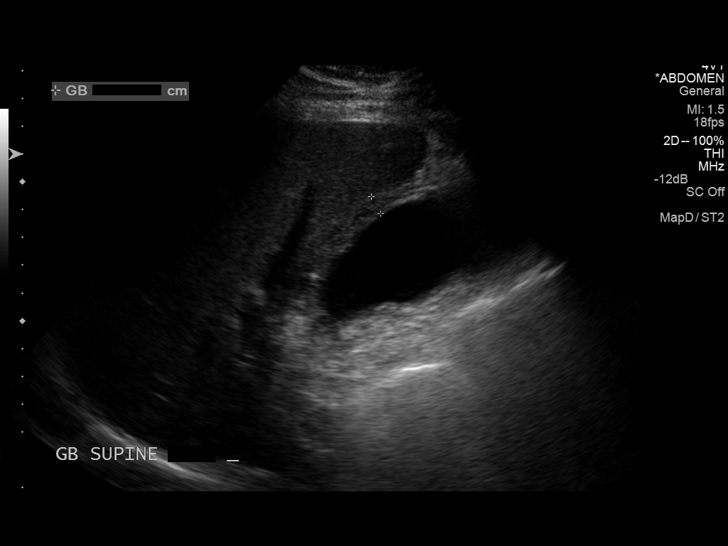
[im 43/80]
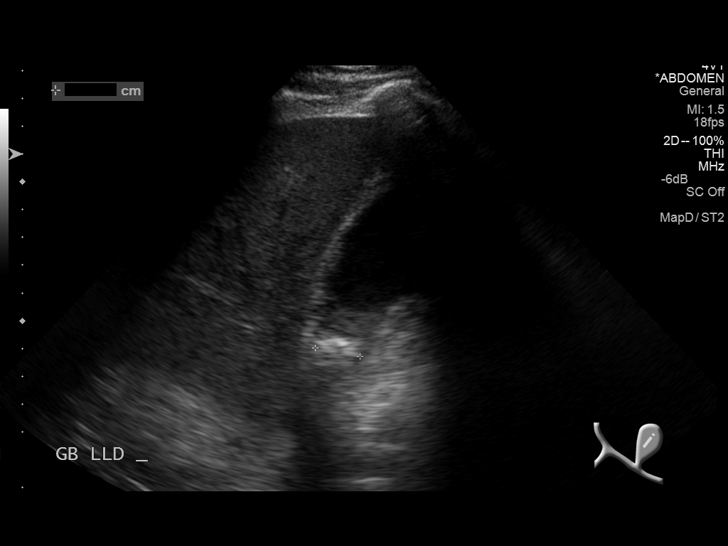
[im 50/80]
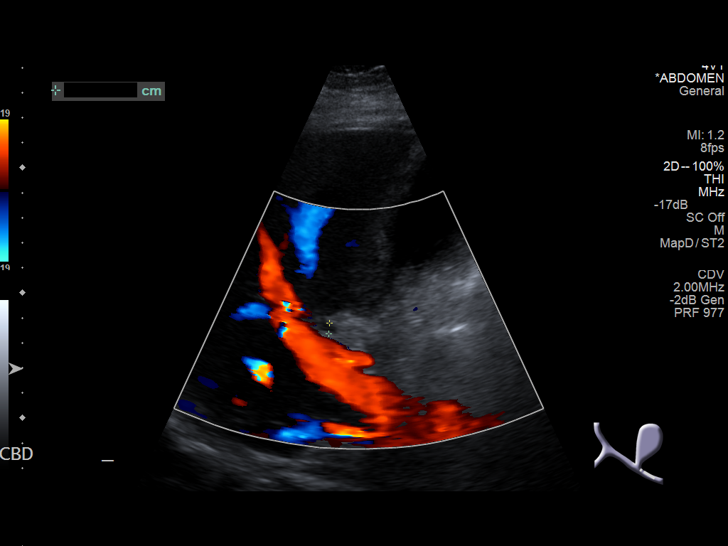
[im 53/80]
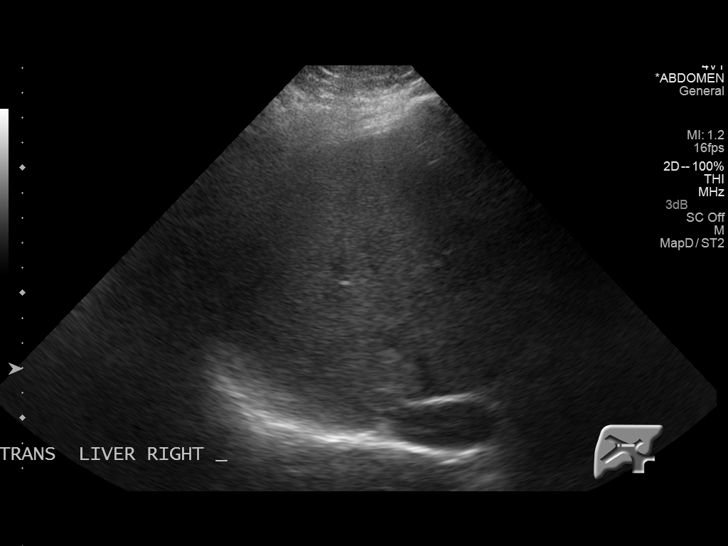
[im 60/80]
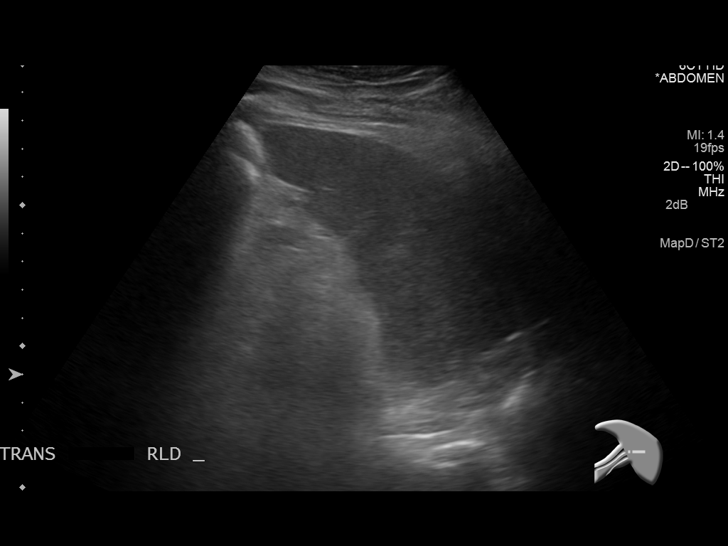
[im 66/80]
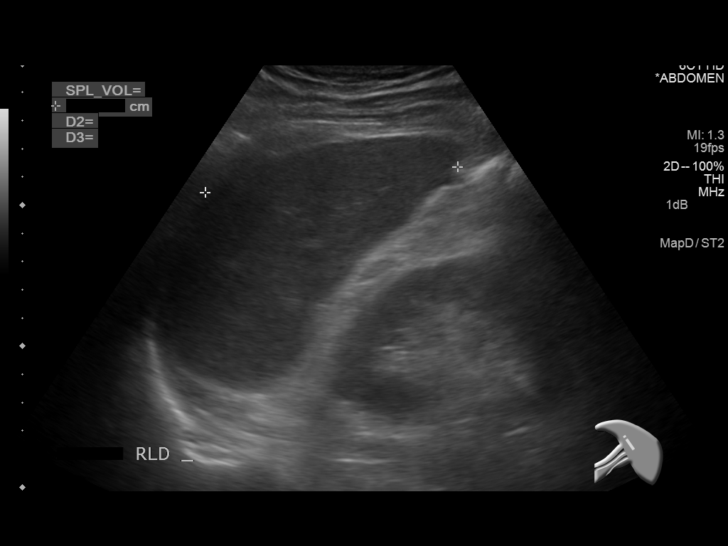
[im 73/80]
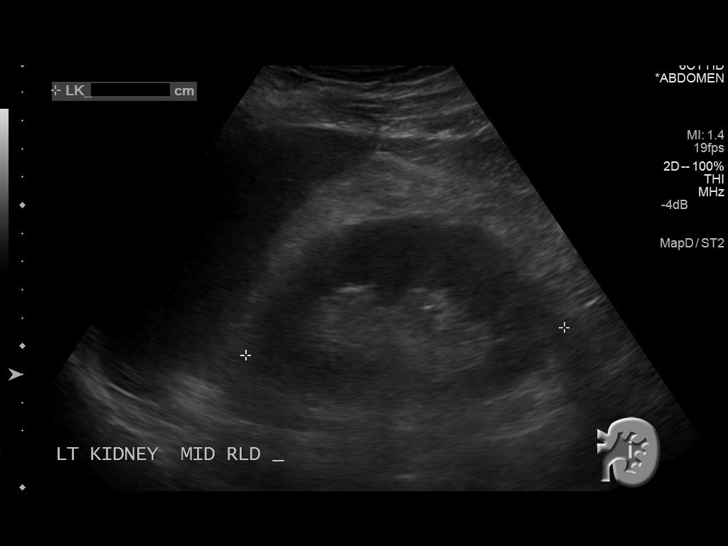
[im 80/80]
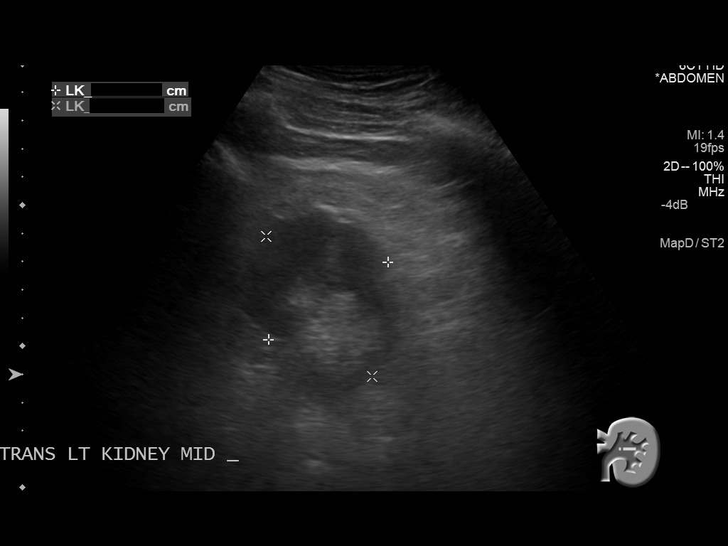

[14 of 25 positions shown; findings below may reference images not displayed]

FINDINGS: Gallbladder: Wall thickening up to 7 mm. 16 mm stone impacted in the
gallbladder neck. Adjacent layering sludge. Sonographer reports
positive sonographic Murphy sign. No pericholecystic fluid
identified.

Common bile duct:  Normal in caliber, 4.3mm diameter.

Liver: Homogeneous in echotexture without focal lesion or
intrahepatic bile duct dilatation.

IVC:  Negative

Pancreas: Visualized segments unremarkable, portions obscured by
overlying bowel gas.

Spleen: No focal lesion, craniocaudal 9cm in length, with adjacent
2.2 cm accessory splenule.

Right Kidney:  No mass or hydronephrosis, 11.0cm in length.

Left Kidney:  No lesion or hydronephrosis, 11.3cm in length.

Abdominal aorta: Visualized segments unremarkable, portions obscured
by overlying bowel gas.
IMPRESSION: 1. Cholelithiasis with gallbladder wall thickening and positive
sonographic Murphy's sign suggesting acute cholecystitis.

## 2016-02-25 DIAGNOSIS — H2513 Age-related nuclear cataract, bilateral: Secondary | ICD-10-CM | POA: Diagnosis not present

## 2016-03-24 DIAGNOSIS — I480 Paroxysmal atrial fibrillation: Secondary | ICD-10-CM | POA: Diagnosis not present

## 2016-03-24 DIAGNOSIS — R001 Bradycardia, unspecified: Secondary | ICD-10-CM | POA: Diagnosis not present

## 2016-03-24 DIAGNOSIS — E782 Mixed hyperlipidemia: Secondary | ICD-10-CM | POA: Diagnosis not present

## 2016-03-24 DIAGNOSIS — I2510 Atherosclerotic heart disease of native coronary artery without angina pectoris: Secondary | ICD-10-CM | POA: Diagnosis not present

## 2016-04-21 DIAGNOSIS — D400 Neoplasm of uncertain behavior of prostate: Secondary | ICD-10-CM | POA: Diagnosis not present

## 2016-04-21 DIAGNOSIS — N5201 Erectile dysfunction due to arterial insufficiency: Secondary | ICD-10-CM | POA: Diagnosis not present

## 2016-04-21 DIAGNOSIS — C61 Malignant neoplasm of prostate: Secondary | ICD-10-CM | POA: Diagnosis not present

## 2016-04-29 ENCOUNTER — Encounter (INDEPENDENT_AMBULATORY_CARE_PROVIDER_SITE_OTHER): Payer: Self-pay

## 2016-05-01 DIAGNOSIS — Z08 Encounter for follow-up examination after completed treatment for malignant neoplasm: Secondary | ICD-10-CM | POA: Diagnosis not present

## 2016-05-01 DIAGNOSIS — Z85828 Personal history of other malignant neoplasm of skin: Secondary | ICD-10-CM | POA: Diagnosis not present

## 2016-05-01 DIAGNOSIS — L400 Psoriasis vulgaris: Secondary | ICD-10-CM | POA: Diagnosis not present

## 2016-05-01 DIAGNOSIS — C44712 Basal cell carcinoma of skin of right lower limb, including hip: Secondary | ICD-10-CM | POA: Diagnosis not present

## 2016-05-01 DIAGNOSIS — X32XXXA Exposure to sunlight, initial encounter: Secondary | ICD-10-CM | POA: Diagnosis not present

## 2016-05-01 DIAGNOSIS — L570 Actinic keratosis: Secondary | ICD-10-CM | POA: Diagnosis not present

## 2016-05-01 DIAGNOSIS — Z85820 Personal history of malignant melanoma of skin: Secondary | ICD-10-CM | POA: Diagnosis not present

## 2016-05-01 DIAGNOSIS — D485 Neoplasm of uncertain behavior of skin: Secondary | ICD-10-CM | POA: Diagnosis not present

## 2016-05-19 ENCOUNTER — Encounter: Payer: Self-pay | Admitting: Family Medicine

## 2016-05-19 ENCOUNTER — Ambulatory Visit (INDEPENDENT_AMBULATORY_CARE_PROVIDER_SITE_OTHER): Payer: Medicare Other | Admitting: Family Medicine

## 2016-05-19 VITALS — BP 138/88 | HR 69 | Temp 98.0°F | Ht 68.0 in | Wt 233.0 lb

## 2016-05-19 DIAGNOSIS — Z1159 Encounter for screening for other viral diseases: Secondary | ICD-10-CM

## 2016-05-19 DIAGNOSIS — I25111 Atherosclerotic heart disease of native coronary artery with angina pectoris with documented spasm: Secondary | ICD-10-CM | POA: Diagnosis not present

## 2016-05-19 DIAGNOSIS — G4730 Sleep apnea, unspecified: Secondary | ICD-10-CM

## 2016-05-19 DIAGNOSIS — I209 Angina pectoris, unspecified: Secondary | ICD-10-CM | POA: Diagnosis not present

## 2016-05-19 DIAGNOSIS — I10 Essential (primary) hypertension: Secondary | ICD-10-CM

## 2016-05-19 DIAGNOSIS — E785 Hyperlipidemia, unspecified: Secondary | ICD-10-CM

## 2016-05-19 DIAGNOSIS — Z0000 Encounter for general adult medical examination without abnormal findings: Secondary | ICD-10-CM

## 2016-05-19 DIAGNOSIS — N400 Enlarged prostate without lower urinary tract symptoms: Secondary | ICD-10-CM | POA: Diagnosis not present

## 2016-05-19 LAB — URINALYSIS, ROUTINE W REFLEX MICROSCOPIC
Bilirubin, UA: NEGATIVE
Glucose, UA: NEGATIVE
KETONES UA: NEGATIVE
LEUKOCYTES UA: NEGATIVE
NITRITE UA: NEGATIVE
PH UA: 5 (ref 5.0–7.5)
Protein, UA: NEGATIVE
RBC UA: NEGATIVE
SPEC GRAV UA: 1.01 (ref 1.005–1.030)
UUROB: 0.2 mg/dL (ref 0.2–1.0)

## 2016-05-19 MED ORDER — LOSARTAN POTASSIUM 100 MG PO TABS: 100.0000 mg | ORAL_TABLET | Freq: Every day | ORAL | 4 refills | Status: DC

## 2016-05-19 MED ORDER — HYDRALAZINE HCL 25 MG PO TABS: 25.0000 mg | ORAL_TABLET | Freq: Three times a day (TID) | ORAL | 4 refills | Status: DC

## 2016-05-19 MED ORDER — HYDROCHLOROTHIAZIDE 25 MG PO TABS: 25.0000 mg | ORAL_TABLET | Freq: Every day | ORAL | 4 refills | Status: DC

## 2016-05-19 MED ORDER — ALLOPURINOL 300 MG PO TABS: 300.0000 mg | ORAL_TABLET | Freq: Every day | ORAL | 4 refills | Status: DC

## 2016-05-19 MED ORDER — METOPROLOL SUCCINATE ER 25 MG PO TB24: 25.0000 mg | ORAL_TABLET | Freq: Every day | ORAL | 4 refills | Status: DC

## 2016-05-19 MED ORDER — ATORVASTATIN CALCIUM 40 MG PO TABS: 40.0000 mg | ORAL_TABLET | Freq: Every day | ORAL | 4 refills | Status: DC

## 2016-05-19 NOTE — Progress Notes (Signed)
BP 138/88 (BP Location: Left Arm)   Pulse 69   Temp 98 F (36.7 C)   Ht 5\' 8"  (1.727 m)   Wt 233 lb (105.7 kg)   SpO2 98%   BMI 35.43 kg/m    Subjective:    Patient ID: MD GORY, male    DOB: Sep 05, 1945, 70 y.o.   MRN: [id number]  HPI: Darrell [name] is a 70 y.o. male  Chief Complaint  Patient presents with  . Annual Exam  Patient all in all doing well blood pressure elevated on recheck Taking medications faithfully hasn't been faithful at dietary intervention Patient doing well with atorvastatin no side effects good cholesterol control No gout symptoms no side effects taken allopurinol without problems. Carotid arteries followed by vascular and stable also with ultrasound.  Relevant past medical, surgical, family and social history reviewed and updated as indicated. Interim medical history since our last visit reviewed. Allergies and medications reviewed and updated.  Review of Systems  Constitutional: Negative.   HENT: Negative.   Eyes: Negative.   Respiratory: Negative.   Cardiovascular: Negative.   Gastrointestinal: Negative.   Endocrine: Negative.   Genitourinary: Negative.   Musculoskeletal: Negative.   Skin: Negative.   Allergic/Immunologic: Negative.   Neurological: Negative.   Hematological: Negative.   Psychiatric/Behavioral: Negative.     Per HPI unless specifically indicated above     Objective:    BP 138/88 (BP Location: Left Arm)   Pulse 69   Temp 98 F (36.7 C)   Ht 5\' 8"  (1.727 m)   Wt 233 lb (105.7 kg)   SpO2 98%   BMI 35.43 kg/m   Wt Readings from Last 3 Encounters:  05/19/16 233 lb (105.7 kg)  11/11/15 232 lb (105.2 kg)  05/09/15 232 lb (105.2 kg)    Physical Exam  Constitutional: He is oriented to person, place, and time. He appears well-developed and well-nourished.  HENT:  Head: Normocephalic.  Right Ear: External ear normal.  Left Ear: External ear normal.  Nose: Nose normal.  Eyes: Conjunctivae and EOM are  normal. Pupils are equal, round, and reactive to light.  Neck: Normal range of motion. Neck supple. No thyromegaly present.  Cardiovascular: Normal rate, regular rhythm, normal heart sounds and intact distal pulses.   Pulmonary/Chest: Effort normal and breath sounds normal.  Abdominal: Soft. Bowel sounds are normal. There is no splenomegaly or hepatomegaly.  Genitourinary:  Genitourinary Comments: GU exam done in urology last week and normal slight elevation PSA but being followed by urology every 6 months.  Musculoskeletal: Normal range of motion.  Lymphadenopathy:    He has no cervical adenopathy.  Neurological: He is alert and oriented to person, place, and time. He has normal reflexes.  Skin: Skin is warm and dry.  Psychiatric: He has a normal mood and affect. His behavior is normal. Judgment and thought content normal.    Results for orders placed or performed in visit on 11/11/15  LP+ALT+AST Piccolo, [employer]  Result Value Ref Range   ALT (SGPT) Piccolo, Waived 34 10 - 47 U/L   AST (SGOT) Piccolo, Waived 34 11 - 38 U/L   Cholesterol Piccolo, Waived 128 <200 mg/dL   HDL Chol Piccolo, Waived 43 (L) >59 mg/dL   Triglycerides Piccolo,Waived 216 (H) <150 mg/dL   Chol/HDL Ratio Piccolo,Waive 3.0 mg/dL   LDL Chol Calc Piccolo Waived 42 <100 mg/dL   VLDL Chol Calc Piccolo,Waive 43 (H) <30 mg/dL  Basic metabolic panel  Result Value Ref Range  Glucose 106 (H) 65 - 99 mg/dL   BUN 19 8 - 27 mg/dL   Creatinine, Ser 1.30 (H) 0.76 - 1.27 mg/dL   GFR calc non Af Amer 56 (L) >59 mL/min/1.73   GFR calc Af Amer 65 >59 mL/min/1.73   BUN/Creatinine Ratio 15 10 - 22   Sodium 139 134 - 144 mmol/L   Potassium 3.7 3.5 - 5.2 mmol/L   Chloride 102 96 - 106 mmol/L   CO2 24 18 - 29 mmol/L   Calcium 9.5 8.6 - 10.2 mg/dL      Assessment & Plan:   Problem List Items Addressed This Visit      Cardiovascular and Mediastinum   Hypertension   Relevant Medications   metoprolol succinate (TOPROL-XL)  25 MG 24 hr tablet   losartan (COZAAR) 100 MG tablet   hydrochlorothiazide (HYDRODIURIL) 25 MG tablet   hydrALAZINE (APRESOLINE) 25 MG tablet   atorvastatin (LIPITOR) 40 MG tablet   Other Relevant Orders   CBC with Differential/Platelet   Comprehensive metabolic panel   Lipid Panel w/o Chol/HDL Ratio   TSH   Urinalysis, Routine w reflex microscopic (not at [facility])   CAD (coronary artery disease)   Relevant Medications   metoprolol succinate (TOPROL-XL) 25 MG 24 hr tablet   losartan (COZAAR) 100 MG tablet   hydrochlorothiazide (HYDRODIURIL) 25 MG tablet   hydrALAZINE (APRESOLINE) 25 MG tablet   atorvastatin (LIPITOR) 40 MG tablet   Other Relevant Orders   CBC with Differential/Platelet   Comprehensive metabolic panel   Lipid Panel w/o Chol/HDL Ratio   TSH   Urinalysis, Routine w reflex microscopic (not at [facility])     Respiratory   Sleep apnea   Relevant Orders   CBC with Differential/Platelet   Comprehensive metabolic panel   Lipid Panel w/o Chol/HDL Ratio   TSH   Urinalysis, Routine w reflex microscopic (not at [facility])     Other   Hyperlipidemia   Relevant Medications   metoprolol succinate (TOPROL-XL) 25 MG 24 hr tablet   losartan (COZAAR) 100 MG tablet   hydrochlorothiazide (HYDRODIURIL) 25 MG tablet   hydrALAZINE (APRESOLINE) 25 MG tablet   atorvastatin (LIPITOR) 40 MG tablet   Other Relevant Orders   CBC with Differential/Platelet   Comprehensive metabolic panel   Lipid Panel w/o Chol/HDL Ratio   TSH   Urinalysis, Routine w reflex microscopic (not at [facility])    Other Visit Diagnoses    Well adult exam    -  Primary   Relevant Orders   CBC with Differential/Platelet   Comprehensive metabolic panel   Lipid Panel w/o Chol/HDL Ratio   TSH   Urinalysis, Routine w reflex microscopic (not at [facility])   Enlarged prostate       Need for hepatitis C screening test       Relevant Orders   Hepatitis C Antibody       Follow up plan: Return in about 6 months (around  11/17/2016) for BMP,  Lipids, ALT, AST.

## 2016-05-20 ENCOUNTER — Encounter: Payer: Self-pay | Admitting: Family Medicine

## 2016-05-20 LAB — CBC WITH DIFFERENTIAL/PLATELET
BASOS ABS: 0 10*3/uL (ref 0.0–0.2)
Basos: 0 %
EOS (ABSOLUTE): 0.2 10*3/uL (ref 0.0–0.4)
Eos: 3 %
Hematocrit: 39.2 % (ref 37.5–51.0)
Hemoglobin: 13.2 g/dL (ref 12.6–17.7)
Immature Grans (Abs): 0 10*3/uL (ref 0.0–0.1)
Immature Granulocytes: 0 %
LYMPHS ABS: 1.6 10*3/uL (ref 0.7–3.1)
Lymphs: 26 %
MCH: 32 pg (ref 26.6–33.0)
MCHC: 33.7 g/dL (ref 31.5–35.7)
MCV: 95 fL (ref 79–97)
MONOS ABS: 0.5 10*3/uL (ref 0.1–0.9)
Monocytes: 9 %
Neutrophils Absolute: 3.7 10*3/uL (ref 1.4–7.0)
Neutrophils: 62 %
Platelets: 141 10*3/uL — ABNORMAL LOW (ref 150–379)
RBC: 4.13 x10E6/uL — AB (ref 4.14–5.80)
RDW: 13.6 % (ref 12.3–15.4)
WBC: 6 10*3/uL (ref 3.4–10.8)

## 2016-05-20 LAB — LIPID PANEL W/O CHOL/HDL RATIO
CHOLESTEROL TOTAL: 118 mg/dL (ref 100–199)
HDL: 40 mg/dL (ref >39)
LDL CALC: 44 mg/dL (ref 0–99)
TRIGLYCERIDES: 171 mg/dL — AB (ref 0–149)
VLDL CHOLESTEROL CAL: 34 mg/dL (ref 5–40)

## 2016-05-20 LAB — COMPREHENSIVE METABOLIC PANEL
ALK PHOS: 111 IU/L (ref 39–117)
ALT: 33 IU/L (ref 0–44)
AST: 30 IU/L (ref 0–40)
Albumin/Globulin Ratio: 1.7 (ref 1.2–2.2)
Albumin: 3.9 g/dL (ref 3.6–4.8)
BILIRUBIN TOTAL: 0.9 mg/dL (ref 0.0–1.2)
BUN/Creatinine Ratio: 14 (ref 10–24)
BUN: 17 mg/dL (ref 8–27)
CHLORIDE: 102 mmol/L (ref 96–106)
CO2: 24 mmol/L (ref 18–29)
CREATININE: 1.23 mg/dL (ref 0.76–1.27)
Calcium: 9.2 mg/dL (ref 8.6–10.2)
GFR calc Af Amer: 69 mL/min/{1.73_m2} (ref >59)
GFR calc non Af Amer: 60 mL/min/{1.73_m2} (ref >59)
Globulin, Total: 2.3 g/dL (ref 1.5–4.5)
Glucose: 115 mg/dL — ABNORMAL HIGH (ref 65–99)
Potassium: 3.8 mmol/L (ref 3.5–5.2)
SODIUM: 142 mmol/L (ref 134–144)
Total Protein: 6.2 g/dL (ref 6.0–8.5)

## 2016-05-20 LAB — TSH: TSH: 2.52 u[IU]/mL (ref 0.450–4.500)

## 2016-05-20 LAB — HEPATITIS C ANTIBODY: HEP C VIRUS AB: 0.1 {s_co_ratio} (ref 0.0–0.9)

## 2016-05-20 LAB — PSA: Prostate Specific Ag, Serum: 3 ng/mL (ref 0.0–4.0)

## 2016-06-04 DIAGNOSIS — Z23 Encounter for immunization: Secondary | ICD-10-CM | POA: Diagnosis not present

## 2016-06-11 ENCOUNTER — Other Ambulatory Visit (INDEPENDENT_AMBULATORY_CARE_PROVIDER_SITE_OTHER): Payer: Self-pay | Admitting: Vascular Surgery

## 2016-06-11 DIAGNOSIS — I6523 Occlusion and stenosis of bilateral carotid arteries: Secondary | ICD-10-CM

## 2016-06-12 ENCOUNTER — Ambulatory Visit (INDEPENDENT_AMBULATORY_CARE_PROVIDER_SITE_OTHER): Payer: Medicare Other | Admitting: Vascular Surgery

## 2016-06-12 ENCOUNTER — Ambulatory Visit (INDEPENDENT_AMBULATORY_CARE_PROVIDER_SITE_OTHER): Payer: Medicare Other

## 2016-06-12 DIAGNOSIS — I6529 Occlusion and stenosis of unspecified carotid artery: Secondary | ICD-10-CM | POA: Insufficient documentation

## 2016-06-12 DIAGNOSIS — E785 Hyperlipidemia, unspecified: Secondary | ICD-10-CM | POA: Diagnosis not present

## 2016-06-12 DIAGNOSIS — I10 Essential (primary) hypertension: Secondary | ICD-10-CM

## 2016-06-12 DIAGNOSIS — I6523 Occlusion and stenosis of bilateral carotid arteries: Secondary | ICD-10-CM | POA: Diagnosis not present

## 2016-06-12 DIAGNOSIS — I25111 Atherosclerotic heart disease of native coronary artery with angina pectoris with documented spasm: Secondary | ICD-10-CM | POA: Diagnosis not present

## 2016-06-12 DIAGNOSIS — I209 Angina pectoris, unspecified: Secondary | ICD-10-CM

## 2016-06-12 NOTE — Progress Notes (Signed)
MRN : [id number]  Darrell [name] is a 70 y.o. (06-08-1946) male who presents with chief complaint of  Chief Complaint  Patient presents with  . Carotid    6 month carotid check  .  History of Present Illness: Patient returns in follow-up of his carotid disease. He is doing well and denies any focal neurologic symptoms. Specifically, the patient denies amaurosis fugax, speech or swallowing difficulties, or arm or leg weakness or numbness His carotid duplex today reveals stable carotid artery stenosis of 1-39% on the right and 40-59% on the left.   Current Outpatient Prescriptions  Medication Sig Dispense Refill  . allopurinol (ZYLOPRIM) 300 MG tablet Take 1 tablet (300 mg total) by mouth daily. 90 tablet 4  . atorvastatin (LIPITOR) 40 MG tablet Take 1 tablet (40 mg total) by mouth daily. 90 tablet 4  . clopidogrel (PLAVIX) 75 MG tablet Take 75 mg by mouth daily.    . hydrALAZINE (APRESOLINE) 25 MG tablet Take 1 tablet (25 mg total) by mouth 3 (three) times daily. 90 tablet 4  . hydrochlorothiazide (HYDRODIURIL) 25 MG tablet Take 1 tablet (25 mg total) by mouth daily. 90 tablet 4  . hydrocortisone (ANUSOL-HC) 25 MG suppository Place 25 mg rectally 2 (two) times daily.    Marland Kitchen losartan (COZAAR) 100 MG tablet Take 1 tablet (100 mg total) by mouth daily. 90 tablet 4  . metoprolol succinate (TOPROL-XL) 25 MG 24 hr tablet Take 1 tablet (25 mg total) by mouth daily. 90 tablet 4  . Secukinumab (COSENTYX SENSOREADY PEN) 150 MG/ML SOAJ Inject into the skin every 30 (thirty) days.     No current facility-administered medications for this visit.     Past Medical History:  Diagnosis Date  . CAD (coronary artery disease)   . Cancer [facility])    prostate  . Carotid artery stenosis   . Hyperlipidemia   . Hypertension   . Psoriasis     Past Surgical History:  Procedure Laterality Date  . CHOLECYSTECTOMY    . LIVER BIOPSY  1999  . SHOULDER SURGERY    . stents      Social History Social  History  Substance Use Topics  . Smoking status: Former Smoker    Types: Cigarettes  . Smokeless tobacco: Never Used  . Alcohol use 0.0 oz/week      Family History Family History  Problem Relation Age of Onset  . Heart attack Mother   . Diabetes Mother   . Stroke Mother   . Heart disease Mother   . Diabetes Brother     No Known Allergies   REVIEW OF SYSTEMS (Negative unless checked)  Constitutional: [] Weight loss  [] Fever  [] Chills Cardiac: [] Chest pain   [] Chest pressure   [] Palpitations   [] Shortness of breath when laying flat   [] Shortness of breath at rest   [] Shortness of breath with exertion. Vascular:  [] Pain in legs with walking   [] Pain in legs at rest   [] Pain in legs when laying flat   [] Claudication   [] Pain in feet when walking  [] Pain in feet at rest  [] Pain in feet when laying flat   [] History of DVT   [] Phlebitis   [] Swelling in legs   [] Varicose veins   [] Non-healing ulcers Pulmonary:   [] Uses home oxygen   [] Productive cough   [] Hemoptysis   [] Wheeze  [] COPD   [] Asthma Neurologic:  [] Dizziness  [] Blackouts   [] Seizures   [] History of stroke   [] History of TIA  []   Aphasia   [] Temporary blindness   [] Dysphagia   [] Weakness or numbness in arms   [] Weakness or numbness in legs Musculoskeletal:  [] Arthritis   [] Joint swelling   [] Joint pain   [] Low back pain Hematologic:  [] Easy bruising  [] Easy bleeding   [] Hypercoagulable state   [] Anemic  [] Hepatitis Gastrointestinal:  [] Blood in stool   [] Vomiting blood  [] Gastroesophageal reflux/heartburn   [] Difficulty swallowing. Genitourinary:  [] Chronic kidney disease   [] Difficult urination  [] Frequent urination  [] Burning with urination   [] Blood in urine Skin:  [] Rashes   [] Ulcers   [] Wounds Psychological:  [] History of anxiety   []  History of major depression.  Physical Examination  There were no vitals filed for this visit. There is no height or weight on file to calculate BMI. Gen:  WD/WN, NAD Head: [place]/AT, No  temporalis wasting. Ear/Nose/Throat: Hearing grossly intact, nares w/o erythema or drainage, trachea midline Eyes: Conjunctiva clear. Sclera non-icteric Neck: Supple.  No bruit or JVD.  Pulmonary:  Good air movement, equal and clear to auscultation bilaterally.  Cardiac: RRR, normal S1, S2, no Murmurs, rubs or gallops. Vascular:  Vessel Right Left  Radial Palpable Palpable                                   Gastrointestinal: soft, non-tender/non-distended. No guarding/reflex.  Musculoskeletal: M/S 5/5 throughout.  No deformity or atrophy.  Neurologic: CN 2-12 intact. Sensation grossly intact in extremities.  Symmetrical.  Speech is fluent. Motor exam as listed above. Psychiatric: Judgment intact, Mood & affect appropriate for pt's clinical situation. Dermatologic: No rashes or ulcers noted.  No cellulitis or open wounds. Lymph : No Cervical, Axillary, or Inguinal lymphadenopathy.     CBC Lab Results  Component Value Date   WBC 6.0 05/19/2016   HGB 11.8 (L) 09/05/2014   HCT 39.2 05/19/2016   MCV 95 05/19/2016   PLT 141 (L) 05/19/2016    BMET    Component Value Date/Time   NA 142 05/19/2016 1010   NA 135 (L) 09/05/2014 0504   K 3.8 05/19/2016 1010   K 3.8 09/05/2014 0504   CL 102 05/19/2016 1010   CL 98 09/05/2014 0504   CO2 24 05/19/2016 1010   CO2 32 09/05/2014 0504   GLUCOSE 115 (H) 05/19/2016 1010   GLUCOSE 108 (H) 09/05/2014 0504   BUN 17 05/19/2016 1010   BUN 26 (H) 09/05/2014 0504   CREATININE 1.23 05/19/2016 1010   CREATININE 1.36 (H) 09/05/2014 0504   CALCIUM 9.2 05/19/2016 1010   CALCIUM 7.8 (L) 09/05/2014 0504   GFRNONAA 60 05/19/2016 1010   GFRNONAA 56 (L) 09/05/2014 0504   GFRNONAA 57 (L) 07/28/2012 0409   GFRAA 69 05/19/2016 1010   GFRAA >60 09/05/2014 0504   GFRAA >60 07/28/2012 0409   CrCl cannot be calculated (Unknown ideal weight.).  COAG Lab Results  Component Value Date   INR 0.9 09/02/2014    Radiology No results  found.    Assessment/Plan Hyperlipidemia lipid control important in reducing the progression of atherosclerotic disease. Continue statin therapy   Hypertension blood pressure control important in reducing the progression of atherosclerotic disease. On appropriate oral medications.   Carotid stenosis His carotid duplex today reveals stable carotid artery stenosis of 1-39% on the right and 40-59% on the left. He has no symptoms and his stenosis is below the threshold for prophylactic repair. Continue Plavix and Lipitor. Recheck in 6 months.  Leotis Pain, MD  06/12/2016 11:35 AM    This note was created with Dragon medical transcription system.  Any errors from dictation are purely unintentional

## 2016-06-12 NOTE — Assessment & Plan Note (Signed)
His carotid duplex today reveals stable carotid artery stenosis of 1-39% on the right and 40-59% on the left. He has no symptoms and his stenosis is below the threshold for prophylactic repair. Continue Plavix and Lipitor. Recheck in 6 months.

## 2016-06-12 NOTE — Assessment & Plan Note (Signed)
lipid control important in reducing the progression of atherosclerotic disease. Continue statin therapy  

## 2016-06-12 NOTE — Assessment & Plan Note (Signed)
blood pressure control important in reducing the progression of atherosclerotic disease. On appropriate oral medications.  

## 2016-06-18 DIAGNOSIS — C44719 Basal cell carcinoma of skin of left lower limb, including hip: Secondary | ICD-10-CM | POA: Diagnosis not present

## 2016-06-18 DIAGNOSIS — L905 Scar conditions and fibrosis of skin: Secondary | ICD-10-CM | POA: Diagnosis not present

## 2016-06-30 ENCOUNTER — Other Ambulatory Visit: Payer: Self-pay | Admitting: Family Medicine

## 2016-06-30 DIAGNOSIS — I10 Essential (primary) hypertension: Secondary | ICD-10-CM

## 2016-07-10 ENCOUNTER — Other Ambulatory Visit: Payer: Self-pay | Admitting: Family Medicine

## 2016-07-10 DIAGNOSIS — I10 Essential (primary) hypertension: Secondary | ICD-10-CM

## 2016-07-28 ENCOUNTER — Encounter: Payer: Self-pay | Admitting: Family Medicine

## 2016-07-28 ENCOUNTER — Ambulatory Visit (INDEPENDENT_AMBULATORY_CARE_PROVIDER_SITE_OTHER): Payer: Medicare Other | Admitting: Family Medicine

## 2016-07-28 VITALS — BP 154/73 | HR 82 | Temp 98.0°F | Ht 68.0 in | Wt 233.8 lb

## 2016-07-28 DIAGNOSIS — I25111 Atherosclerotic heart disease of native coronary artery with angina pectoris with documented spasm: Secondary | ICD-10-CM

## 2016-07-28 DIAGNOSIS — I10 Essential (primary) hypertension: Secondary | ICD-10-CM

## 2016-07-28 DIAGNOSIS — I209 Angina pectoris, unspecified: Secondary | ICD-10-CM | POA: Diagnosis not present

## 2016-07-28 DIAGNOSIS — I6523 Occlusion and stenosis of bilateral carotid arteries: Secondary | ICD-10-CM

## 2016-07-28 NOTE — Progress Notes (Signed)
BP (!) 154/73 (BP Location: Left Arm, Patient Position: Sitting, Cuff Size: Large)   Pulse 82   Temp 98 F (36.7 C)   Ht 5\' 8"  (1.727 m)   Wt 233 lb 12.8 oz (106.1 kg)   SpO2 95%   BMI 35.55 kg/m    Subjective:    Patient ID: Darrell [name], male    DOB: 1945/12/15, 70 y.o.   MRN: [id number]  HPI: KHARSON FRANZEL is a 70 y.o. male  Chief Complaint  Patient presents with  . Chest Tightness  Patient for the last several weeks his had some intermediate symptoms with chest feeling fluttery at times on maybe some chest tightness not really associated with exertion and is able to walk and does okay it seems to actually stop during walking. Kind of feels like he is on edge seems to notice it more at rest. Patient with no other associated issues is getting ready to travel to [place] until the new year.   Relevant past medical, surgical, family and social history reviewed and updated as indicated. Interim medical history since our last visit reviewed. Allergies and medications reviewed and updated.  Review of Systems  Constitutional: Negative.  Negative for chills, diaphoresis, fatigue and fever.  Respiratory: Positive for shortness of breath. Negative for apnea, cough and chest tightness.   Cardiovascular: Positive for palpitations. Negative for chest pain and leg swelling.    Per HPI unless specifically indicated above     Objective:    BP (!) 154/73 (BP Location: Left Arm, Patient Position: Sitting, Cuff Size: Large)   Pulse 82   Temp 98 F (36.7 C)   Ht 5\' 8"  (1.727 m)   Wt 233 lb 12.8 oz (106.1 kg)   SpO2 95%   BMI 35.55 kg/m   Wt Readings from Last 3 Encounters:  07/28/16 233 lb 12.8 oz (106.1 kg)  05/19/16 233 lb (105.7 kg)  11/11/15 232 lb (105.2 kg)    Physical Exam  Constitutional: He is oriented to person, place, and time. He appears well-developed and well-nourished. No distress.  HENT:  Head: Normocephalic and atraumatic.  Right Ear: Hearing normal.    Left Ear: Hearing normal.  Nose: Nose normal.  Eyes: Conjunctivae and lids are normal. Right eye exhibits no discharge. Left eye exhibits no discharge. No scleral icterus.  Cardiovascular: Normal rate and normal heart sounds.   Pulmonary/Chest: Effort normal and breath sounds normal. No respiratory distress.  Musculoskeletal: Normal range of motion.  Neurological: He is alert and oriented to person, place, and time.  Skin: Skin is intact. No rash noted.  Psychiatric: He has a normal mood and affect. His speech is normal and behavior is normal. Judgment and thought content normal. Cognition and memory are normal.    Results for orders placed or performed in visit on 05/19/16  CBC with Differential/Platelet  Result Value Ref Range   WBC 6.0 3.4 - 10.8 x10E3/uL   RBC 4.13 (L) 4.14 - 5.80 x10E6/uL   Hemoglobin 13.2 12.6 - 17.7 g/dL   Hematocrit 39.2 37.5 - 51.0 %   MCV 95 79 - 97 fL   MCH 32.0 26.6 - 33.0 pg   MCHC 33.7 31.5 - 35.7 g/dL   RDW 13.6 12.3 - 15.4 %   Platelets 141 (L) 150 - 379 x10E3/uL   Neutrophils 62 Not Estab. %   Lymphs 26 Not Estab. %   Monocytes 9 Not Estab. %   Eos 3 Not Estab. %   Basos  0 Not Estab. %   Neutrophils Absolute 3.7 1.4 - 7.0 x10E3/uL   Lymphocytes Absolute 1.6 0.7 - 3.1 x10E3/uL   Monocytes Absolute 0.5 0.1 - 0.9 x10E3/uL   EOS (ABSOLUTE) 0.2 0.0 - 0.4 x10E3/uL   Basophils Absolute 0.0 0.0 - 0.2 x10E3/uL   Immature Granulocytes 0 Not Estab. %   Immature Grans (Abs) 0.0 0.0 - 0.1 x10E3/uL  Comprehensive metabolic panel  Result Value Ref Range   Glucose 115 (H) 65 - 99 mg/dL   BUN 17 8 - 27 mg/dL   Creatinine, Ser 1.23 0.76 - 1.27 mg/dL   GFR calc non Af Amer 60 >59 mL/min/1.73   GFR calc Af Amer 69 >59 mL/min/1.73   BUN/Creatinine Ratio 14 10 - 24   Sodium 142 134 - 144 mmol/L   Potassium 3.8 3.5 - 5.2 mmol/L   Chloride 102 96 - 106 mmol/L   CO2 24 18 - 29 mmol/L   Calcium 9.2 8.6 - 10.2 mg/dL   Total Protein 6.2 6.0 - 8.5 g/dL    Albumin 3.9 3.6 - 4.8 g/dL   Globulin, Total 2.3 1.5 - 4.5 g/dL   Albumin/Globulin Ratio 1.7 1.2 - 2.2   Bilirubin Total 0.9 0.0 - 1.2 mg/dL   Alkaline Phosphatase 111 39 - 117 IU/L   AST 30 0 - 40 IU/L   ALT 33 0 - 44 IU/L  Lipid Panel w/o Chol/HDL Ratio  Result Value Ref Range   Cholesterol, Total 118 100 - 199 mg/dL   Triglycerides 171 (H) 0 - 149 mg/dL   HDL 40 >39 mg/dL   VLDL Cholesterol Cal 34 5 - 40 mg/dL   LDL Calculated 44 0 - 99 mg/dL  TSH  Result Value Ref Range   TSH 2.520 0.450 - 4.500 uIU/mL  Urinalysis, Routine w reflex microscopic (not at [facility])  Result Value Ref Range   Specific Gravity, UA 1.010 1.005 - 1.030   pH, UA 5.0 5.0 - 7.5   Color, UA Yellow Yellow   Appearance Ur Clear Clear   Leukocytes, UA Negative Negative   Protein, UA Negative Negative/Trace   Glucose, UA Negative Negative   Ketones, UA Negative Negative   RBC, UA Negative Negative   Bilirubin, UA Negative Negative   Urobilinogen, Ur 0.2 0.2 - 1.0 mg/dL   Nitrite, UA Negative Negative  Hepatitis C Antibody  Result Value Ref Range   Hep C Virus Ab 0.1 0.0 - 0.9 s/co ratio  PSA  Result Value Ref Range   Prostate Specific Ag, Serum 3.0 0.0 - 4.0 ng/mL      Assessment & Plan:   Problem List Items Addressed This Visit      Cardiovascular and Mediastinum   Hypertension - Primary   Relevant Orders   EKG 12-Lead (Completed)   CAD (coronary artery disease)    Reviewed cardiology notes and previous EKGs from cardiology. Patient with history of atrial fibrillation no change in EKG today from one done in August. Patient with PACs and probable intermittent atrial flutter with no change from notes from this August with cardiology. Patient will observe symptoms if persisting after gets back home will get back to cardiology for scheduled visit in May.           Follow up plan: Return for As scheduled.

## 2016-07-28 NOTE — Assessment & Plan Note (Addendum)
Reviewed cardiology notes and previous EKGs from cardiology. Patient with history of atrial fibrillation no change in EKG today from one done in August. Patient with PACs and probable intermittent atrial flutter with no change from notes from this August with cardiology. Patient will observe symptoms if persisting after gets back home will get back to cardiology for scheduled visit in May.

## 2016-09-18 DIAGNOSIS — D2261 Melanocytic nevi of right upper limb, including shoulder: Secondary | ICD-10-CM | POA: Diagnosis not present

## 2016-09-18 DIAGNOSIS — L570 Actinic keratosis: Secondary | ICD-10-CM | POA: Diagnosis not present

## 2016-09-18 DIAGNOSIS — Z85820 Personal history of malignant melanoma of skin: Secondary | ICD-10-CM | POA: Diagnosis not present

## 2016-09-18 DIAGNOSIS — D2272 Melanocytic nevi of left lower limb, including hip: Secondary | ICD-10-CM | POA: Diagnosis not present

## 2016-09-18 DIAGNOSIS — D225 Melanocytic nevi of trunk: Secondary | ICD-10-CM | POA: Diagnosis not present

## 2016-09-18 DIAGNOSIS — L400 Psoriasis vulgaris: Secondary | ICD-10-CM | POA: Diagnosis not present

## 2016-09-18 DIAGNOSIS — Z5181 Encounter for therapeutic drug level monitoring: Secondary | ICD-10-CM | POA: Diagnosis not present

## 2016-09-18 DIAGNOSIS — X32XXXA Exposure to sunlight, initial encounter: Secondary | ICD-10-CM | POA: Diagnosis not present

## 2016-11-18 ENCOUNTER — Encounter: Payer: Self-pay | Admitting: Family Medicine

## 2016-11-18 ENCOUNTER — Ambulatory Visit (INDEPENDENT_AMBULATORY_CARE_PROVIDER_SITE_OTHER): Payer: Medicare Other | Admitting: Family Medicine

## 2016-11-18 VITALS — BP 181/74 | HR 59 | Ht 68.0 in | Wt 238.0 lb

## 2016-11-18 DIAGNOSIS — I10 Essential (primary) hypertension: Secondary | ICD-10-CM | POA: Diagnosis not present

## 2016-11-18 DIAGNOSIS — E785 Hyperlipidemia, unspecified: Secondary | ICD-10-CM | POA: Diagnosis not present

## 2016-11-18 LAB — LP+ALT+AST PICCOLO, WAIVED
ALT (SGPT) Piccolo, Waived: 28 U/L (ref 10–47)
AST (SGOT) PICCOLO, WAIVED: 32 U/L (ref 11–38)
CHOL/HDL RATIO PICCOLO,WAIVE: 3.2 mg/dL
Cholesterol Piccolo, Waived: 112 mg/dL (ref <200)
HDL Chol Piccolo, Waived: 36 mg/dL — ABNORMAL LOW (ref >59)
LDL Chol Calc Piccolo Waived: 24 mg/dL (ref <100)
TRIGLYCERIDES PICCOLO,WAIVED: 264 mg/dL — AB (ref <150)
VLDL Chol Calc Piccolo,Waive: 53 mg/dL — ABNORMAL HIGH (ref <30)

## 2016-11-18 NOTE — Assessment & Plan Note (Signed)
The current medical regimen is effective;  continue present plan and medications.  

## 2016-11-18 NOTE — Progress Notes (Signed)
BP (!) 181/74   Pulse (!) 59   Ht 5\' 8"  (1.727 m)   Wt 238 lb (108 kg)   SpO2 98%   BMI 36.19 kg/m    Subjective:    Patient ID: Darrell [name], male    DOB: 23-Sep-1945, 71 y.o.   MRN: [id number]  HPI: Darrell [name] is a 71 y.o. male  Chief Complaint  Patient presents with  . Follow-up  . Hypertension  Recheck hypertension taking medications faithfully without problems. Has been more sedentary and liberalize diet these last couple of weeks as has been in [place]. Prior to that blood pressure had been doing well. No coronary artery symptoms or cholesterol issues taking medications faithfully without problems.  Relevant past medical, surgical, family and social history reviewed and updated as indicated. Interim medical history since our last visit reviewed. Allergies and medications reviewed and updated.  Review of Systems  Constitutional: Negative.   Respiratory: Negative.   Cardiovascular: Negative.     Per HPI unless specifically indicated above     Objective:    BP (!) 181/74   Pulse (!) 59   Ht 5\' 8"  (1.727 m)   Wt 238 lb (108 kg)   SpO2 98%   BMI 36.19 kg/m   Wt Readings from Last 3 Encounters:  11/18/16 238 lb (108 kg)  07/28/16 233 lb 12.8 oz (106.1 kg)  05/19/16 233 lb (105.7 kg)    Physical Exam  Constitutional: He is oriented to person, place, and time. He appears well-developed and well-nourished.  HENT:  Head: Normocephalic and atraumatic.  Eyes: Conjunctivae and EOM are normal.  Neck: Normal range of motion.  Cardiovascular: Normal rate, regular rhythm and normal heart sounds.   Pulmonary/Chest: Effort normal and breath sounds normal.  Musculoskeletal: Normal range of motion.  Neurological: He is alert and oriented to person, place, and time.  Skin: No erythema.  Psychiatric: He has a normal mood and affect. His behavior is normal. Judgment and thought content normal.    Results for orders placed or performed in visit on 05/19/16    CBC with Differential/Platelet  Result Value Ref Range   WBC 6.0 3.4 - 10.8 x10E3/uL   RBC 4.13 (L) 4.14 - 5.80 x10E6/uL   Hemoglobin 13.2 12.6 - 17.7 g/dL   Hematocrit 39.2 37.5 - 51.0 %   MCV 95 79 - 97 fL   MCH 32.0 26.6 - 33.0 pg   MCHC 33.7 31.5 - 35.7 g/dL   RDW 13.6 12.3 - 15.4 %   Platelets 141 (L) 150 - 379 x10E3/uL   Neutrophils 62 Not Estab. %   Lymphs 26 Not Estab. %   Monocytes 9 Not Estab. %   Eos 3 Not Estab. %   Basos 0 Not Estab. %   Neutrophils Absolute 3.7 1.4 - 7.0 x10E3/uL   Lymphocytes Absolute 1.6 0.7 - 3.1 x10E3/uL   Monocytes Absolute 0.5 0.1 - 0.9 x10E3/uL   EOS (ABSOLUTE) 0.2 0.0 - 0.4 x10E3/uL   Basophils Absolute 0.0 0.0 - 0.2 x10E3/uL   Immature Granulocytes 0 Not Estab. %   Immature Grans (Abs) 0.0 0.0 - 0.1 x10E3/uL  Comprehensive metabolic panel  Result Value Ref Range   Glucose 115 (H) 65 - 99 mg/dL   BUN 17 8 - 27 mg/dL   Creatinine, Ser 1.23 0.76 - 1.27 mg/dL   GFR calc non Af Amer 60 >59 mL/min/1.73   GFR calc Af Amer 69 >59 mL/min/1.73   BUN/Creatinine Ratio 14 10 -  24   Sodium 142 134 - 144 mmol/L   Potassium 3.8 3.5 - 5.2 mmol/L   Chloride 102 96 - 106 mmol/L   CO2 24 18 - 29 mmol/L   Calcium 9.2 8.6 - 10.2 mg/dL   Total Protein 6.2 6.0 - 8.5 g/dL   Albumin 3.9 3.6 - 4.8 g/dL   Globulin, Total 2.3 1.5 - 4.5 g/dL   Albumin/Globulin Ratio 1.7 1.2 - 2.2   Bilirubin Total 0.9 0.0 - 1.2 mg/dL   Alkaline Phosphatase 111 39 - 117 IU/L   AST 30 0 - 40 IU/L   ALT 33 0 - 44 IU/L  Lipid Panel w/o Chol/HDL Ratio  Result Value Ref Range   Cholesterol, Total 118 100 - 199 mg/dL   Triglycerides 171 (H) 0 - 149 mg/dL   HDL 40 >39 mg/dL   VLDL Cholesterol Cal 34 5 - 40 mg/dL   LDL Calculated 44 0 - 99 mg/dL  TSH  Result Value Ref Range   TSH 2.520 0.450 - 4.500 uIU/mL  Urinalysis, Routine w reflex microscopic (not at [facility])  Result Value Ref Range   Specific Gravity, UA 1.010 1.005 - 1.030   pH, UA 5.0 5.0 - 7.5   Color, UA Yellow  Yellow   Appearance Ur Clear Clear   Leukocytes, UA Negative Negative   Protein, UA Negative Negative/Trace   Glucose, UA Negative Negative   Ketones, UA Negative Negative   RBC, UA Negative Negative   Bilirubin, UA Negative Negative   Urobilinogen, Ur 0.2 0.2 - 1.0 mg/dL   Nitrite, UA Negative Negative  Hepatitis C Antibody  Result Value Ref Range   Hep C Virus Ab 0.1 0.0 - 0.9 s/co ratio  PSA  Result Value Ref Range   Prostate Specific Ag, Serum 3.0 0.0 - 4.0 ng/mL      Assessment & Plan:   Problem List Items Addressed This Visit      Cardiovascular and Mediastinum   Hypertension - Primary    Patient recheck blood pressure elevated will not increase medications at this point and patient will double down on diet exercise nutrition recheck 1 month.      Relevant Orders   Basic metabolic panel   LP+ALT+AST Piccolo, Waived     Other   Hyperlipidemia    The current medical regimen is effective;  continue present plan and medications.       Relevant Orders   Basic metabolic panel   LP+ALT+AST Piccolo, Waived       Follow up plan: Return in about 4 weeks (around 12/16/2016) for BP check.

## 2016-11-18 NOTE — Assessment & Plan Note (Signed)
Patient recheck blood pressure elevated will not increase medications at this point and patient will double down on diet exercise nutrition recheck 1 month.

## 2016-11-19 ENCOUNTER — Telehealth: Payer: Self-pay | Admitting: Family Medicine

## 2016-11-19 ENCOUNTER — Encounter: Payer: Self-pay | Admitting: Family Medicine

## 2016-11-19 LAB — BASIC METABOLIC PANEL
BUN/Creatinine Ratio: 15 (ref 10–24)
BUN: 18 mg/dL (ref 8–27)
CALCIUM: 8.9 mg/dL (ref 8.6–10.2)
CO2: 25 mmol/L (ref 18–29)
CREATININE: 1.17 mg/dL (ref 0.76–1.27)
Chloride: 102 mmol/L (ref 96–106)
GFR calc Af Amer: 73 mL/min/{1.73_m2} (ref >59)
GFR, EST NON AFRICAN AMERICAN: 63 mL/min/{1.73_m2} (ref >59)
GLUCOSE: 121 mg/dL — AB (ref 65–99)
Potassium: 3.5 mmol/L (ref 3.5–5.2)
SODIUM: 142 mmol/L (ref 134–144)

## 2016-11-19 NOTE — Telephone Encounter (Signed)
Phone call Discussed with patient nonfasting glucose done yesterday though showing overall upward trend over the last year discuss prediabetes diet exercise nutrition

## 2016-11-26 ENCOUNTER — Ambulatory Visit (INDEPENDENT_AMBULATORY_CARE_PROVIDER_SITE_OTHER): Payer: Medicare Other | Admitting: Vascular Surgery

## 2016-11-26 ENCOUNTER — Encounter (INDEPENDENT_AMBULATORY_CARE_PROVIDER_SITE_OTHER): Payer: Self-pay | Admitting: Vascular Surgery

## 2016-11-26 ENCOUNTER — Ambulatory Visit (INDEPENDENT_AMBULATORY_CARE_PROVIDER_SITE_OTHER): Payer: Medicare Other

## 2016-11-26 VITALS — BP 146/82 | HR 76 | Resp 17 | Ht 69.0 in | Wt 229.0 lb

## 2016-11-26 DIAGNOSIS — R7303 Prediabetes: Secondary | ICD-10-CM | POA: Diagnosis not present

## 2016-11-26 DIAGNOSIS — I6523 Occlusion and stenosis of bilateral carotid arteries: Secondary | ICD-10-CM

## 2016-11-26 DIAGNOSIS — E785 Hyperlipidemia, unspecified: Secondary | ICD-10-CM

## 2016-11-27 LAB — VAS US CAROTID
LEFT ECA DIAS: -17 cm/s
LEFT VERTEBRAL DIAS: 21 cm/s
LICAPDIAS: -46 cm/s
Left CCA dist dias: -26 cm/s
Left CCA dist sys: -104 cm/s
Left CCA prox dias: 17 cm/s
Left CCA prox sys: 94 cm/s
Left ICA dist dias: 23 cm/s
Left ICA dist sys: 75 cm/s
Left ICA prox sys: -123 cm/s
RCCADSYS: -65 cm/s
RCCAPDIAS: 14 cm/s
RIGHT CCA MID DIAS: -17 cm/s
RIGHT ECA DIAS: 10 cm/s
RIGHT VERTEBRAL DIAS: 1 cm/s
Right CCA prox sys: 103 cm/s

## 2016-12-11 ENCOUNTER — Encounter (INDEPENDENT_AMBULATORY_CARE_PROVIDER_SITE_OTHER): Payer: Medicare Other

## 2016-12-11 ENCOUNTER — Ambulatory Visit (INDEPENDENT_AMBULATORY_CARE_PROVIDER_SITE_OTHER): Payer: Medicare Other | Admitting: Vascular Surgery

## 2016-12-17 ENCOUNTER — Encounter: Payer: Self-pay | Admitting: Family Medicine

## 2016-12-17 ENCOUNTER — Ambulatory Visit (INDEPENDENT_AMBULATORY_CARE_PROVIDER_SITE_OTHER): Payer: Medicare Other | Admitting: Family Medicine

## 2016-12-17 VITALS — BP 122/78 | HR 86 | Ht 69.0 in | Wt 225.0 lb

## 2016-12-17 DIAGNOSIS — I10 Essential (primary) hypertension: Secondary | ICD-10-CM

## 2016-12-17 DIAGNOSIS — M109 Gout, unspecified: Secondary | ICD-10-CM | POA: Diagnosis not present

## 2016-12-17 DIAGNOSIS — R7309 Other abnormal glucose: Secondary | ICD-10-CM

## 2016-12-17 DIAGNOSIS — R7303 Prediabetes: Secondary | ICD-10-CM

## 2016-12-17 DIAGNOSIS — I6523 Occlusion and stenosis of bilateral carotid arteries: Secondary | ICD-10-CM

## 2016-12-17 DIAGNOSIS — I25111 Atherosclerotic heart disease of native coronary artery with angina pectoris with documented spasm: Secondary | ICD-10-CM

## 2016-12-17 DIAGNOSIS — I209 Angina pectoris, unspecified: Secondary | ICD-10-CM

## 2016-12-17 LAB — BAYER DCA HB A1C WAIVED: HB A1C: 6 % (ref <7.0)

## 2016-12-17 NOTE — Assessment & Plan Note (Signed)
The current medical regimen is effective;  continue present plan and medications.  

## 2016-12-17 NOTE — Assessment & Plan Note (Signed)
Labs pending.  

## 2016-12-17 NOTE — Assessment & Plan Note (Signed)
Active treatment with great weight loss will continue weight loss A1c of 6 today

## 2016-12-17 NOTE — Progress Notes (Signed)
   BP 122/78   Pulse 86   Ht 5\' 9"  (1.753 m)   Wt 225 lb (102.1 kg)   SpO2 99%   BMI 33.23 kg/m    Subjective:    Patient ID: Darrell [name], male    DOB: [birth year], 71 y.o.   MRN: [id number]  HPI: [name] is a 71 y.o. male  Chief Complaint  Patient presents with  . Hypertension  . Follow-up  Blood pressure doing well no complaints takes medications without problems [place] doing well without any signs or symptoms. Cholesterol doing well with medications no complaints. Prediabetes being treated actively with great weight loss.  Relevant past medical, surgical, family and social history reviewed and updated as indicated. Interim medical history since our last visit reviewed. Allergies and medications reviewed and updated.  Review of Systems  Constitutional: Negative.   Respiratory: Negative.   Cardiovascular: Negative.     Per HPI unless specifically indicated above     Objective:    BP 122/78   Pulse 86   Ht 5\' 9"  (1.753 m)   Wt 225 lb (102.1 kg)   SpO2 99%   BMI 33.23 kg/m   Wt Readings from Last 3 Encounters:  12/17/16 225 lb (102.1 kg)  11/26/16 229 lb (103.9 kg)  11/18/16 238 lb (108 kg)    Physical Exam  Constitutional: He is oriented to person, place, and time. He appears well-developed and well-nourished.  HENT:  Head: Normocephalic and atraumatic.  Eyes: Conjunctivae and EOM are normal.  Neck: Normal range of motion.  Cardiovascular: Normal rate, regular rhythm and normal heart sounds.   Pulmonary/Chest: Effort normal and breath sounds normal.  Musculoskeletal: Normal range of motion.  Neurological: He is alert and oriented to person, place, and time.  Skin: No erythema.  Psychiatric: He has a normal mood and affect. His behavior is normal. Judgment and thought content normal.        Assessment & Plan:   Problem List Items Addressed This Visit      Cardiovascular and Mediastinum   Hypertension    The current medical regimen is  effective;  continue present plan and medications.       Relevant Orders   Basic metabolic panel   CAD (coronary artery disease)    The current medical regimen is effective;  continue present plan and medications.       Relevant Orders   Lipid Panel w/o Chol/HDL Ratio   ALT   AST     Other   Gout    The current medical regimen is effective;  continue present plan and medications.       Prediabetes    Active treatment with great weight loss will continue weight loss A1c of 6 today       Other Visit Diagnoses    Elevated glucose    -  Primary   Relevant Orders   Bayer DCA Hb A1c Waived       Follow up plan: Return in about 6 months (around 06/19/2017) for Physical Exam, Hemoglobin A1c.

## 2016-12-18 LAB — BASIC METABOLIC PANEL
BUN / CREAT RATIO: 17 (ref 10–24)
BUN: 20 mg/dL (ref 8–27)
CHLORIDE: 103 mmol/L (ref 96–106)
CO2: 23 mmol/L (ref 18–29)
Calcium: 9.3 mg/dL (ref 8.6–10.2)
Creatinine, Ser: 1.16 mg/dL (ref 0.76–1.27)
GFR calc non Af Amer: 64 mL/min/{1.73_m2} (ref >59)
GFR, EST AFRICAN AMERICAN: 74 mL/min/{1.73_m2} (ref >59)
Glucose: 113 mg/dL — ABNORMAL HIGH (ref 65–99)
Potassium: 3.6 mmol/L (ref 3.5–5.2)
Sodium: 140 mmol/L (ref 134–144)

## 2016-12-18 LAB — LIPID PANEL W/O CHOL/HDL RATIO
Cholesterol, Total: 104 mg/dL (ref 100–199)
HDL: 41 mg/dL (ref >39)
LDL CALC: 39 mg/dL (ref 0–99)
Triglycerides: 122 mg/dL (ref 0–149)
VLDL CHOLESTEROL CAL: 24 mg/dL (ref 5–40)

## 2016-12-18 LAB — ALT: ALT: 24 IU/L (ref 0–44)

## 2016-12-18 LAB — AST: AST: 24 IU/L (ref 0–40)

## 2016-12-21 ENCOUNTER — Encounter: Payer: Self-pay | Admitting: Family Medicine

## 2016-12-29 DIAGNOSIS — E782 Mixed hyperlipidemia: Secondary | ICD-10-CM | POA: Diagnosis not present

## 2016-12-29 DIAGNOSIS — I10 Essential (primary) hypertension: Secondary | ICD-10-CM | POA: Diagnosis not present

## 2016-12-29 DIAGNOSIS — R001 Bradycardia, unspecified: Secondary | ICD-10-CM | POA: Diagnosis not present

## 2016-12-29 DIAGNOSIS — I255 Ischemic cardiomyopathy: Secondary | ICD-10-CM | POA: Diagnosis not present

## 2016-12-29 DIAGNOSIS — I2510 Atherosclerotic heart disease of native coronary artery without angina pectoris: Secondary | ICD-10-CM | POA: Diagnosis not present

## 2016-12-29 DIAGNOSIS — I480 Paroxysmal atrial fibrillation: Secondary | ICD-10-CM | POA: Diagnosis not present

## 2017-02-09 NOTE — Progress Notes (Signed)
Subjective:    Patient ID: Darrell [name], male    DOB: Jul 20, 1946, 71 y.o.   MRN: [id number] Chief Complaint  Patient presents with  . Carotid    6 month folow up   Patient presents for a 6 month non-invasive study follow up for carotid stenosis. The stenosis has been followed by surveillance duplexes. The patient underwent a bilateral carotid duplex scan which showed no change from the previous exam on 06-12-16. Duplex is stable at Right ICA stenosis (1-39%) and Left ICA stenosis (40-59%). Elevated velocities of the left subclavian artery. The patient denies experiencing Amaurosis Fugax, TIA like symptoms or focal motor deficits. The patient also denies any left upper extremity hand pain. Patient denies any fever, nausea or vomiting.   Review of Systems  Constitutional: Negative.   HENT: Negative.   Eyes: Negative.   Respiratory: Negative.   Cardiovascular: Negative.   Gastrointestinal: Negative.   Endocrine: Negative.   Genitourinary: Negative.   Musculoskeletal: Negative.   Skin: Negative.   Allergic/Immunologic: Negative.   Neurological: Negative.   Hematological: Negative.   Psychiatric/Behavioral: Negative.       Objective:   Physical Exam  Constitutional: He is oriented to person, place, and time. He appears well-developed. No distress.  HENT:  Head: Normocephalic and atraumatic.  Eyes: Conjunctivae are normal. Pupils are equal, round, and reactive to light.  Neck: Normal range of motion.  No carotid bruits noted  Cardiovascular: Normal rate, regular rhythm, normal heart sounds and intact distal pulses.   Pulses:      Radial pulses are 2+ on the right side, and 2+ on the left side.  Pulmonary/Chest: Effort normal.  Musculoskeletal: Normal range of motion. He exhibits no edema.  Neurological: He is alert and oriented to person, place, and time.  Skin: Skin is warm and dry. He is not diaphoretic.  Psychiatric: He has a normal mood and affect. His behavior is  normal. Judgment and thought content normal.  Vitals reviewed.  BP (!) 146/82 (BP Location: Left Arm)   Pulse 76   Resp 17   Ht 5\' 9"  (1.753 m)   Wt 229 lb (103.9 kg)   BMI 33.82 kg/m   Past Medical History:  Diagnosis Date  . CAD (coronary artery disease)   . Cancer [facility])    prostate  . Carotid artery stenosis   . Hyperlipidemia   . Hypertension   . Psoriasis    Social History   Social History  . Marital status: Married    Spouse name: N/A  . Number of children: N/A  . Years of education: N/A   Occupational History  . Not on file.   Social History Main Topics  . Smoking status: Former Smoker    Types: Cigarettes  . Smokeless tobacco: Never Used  . Alcohol use 0.0 oz/week  . Drug use: No  . Sexual activity: Not on file   Other Topics Concern  . Not on file   Social History Narrative  . No narrative on file   Past Surgical History:  Procedure Laterality Date  . CHOLECYSTECTOMY    . LIVER BIOPSY  1999  . SHOULDER SURGERY    . stents     Family History  Problem Relation Age of Onset  . Heart attack Mother   . Diabetes Mother   . Stroke Mother   . Heart disease Mother   . Diabetes Brother    No Known Allergies     Assessment & Plan:  Patient  presents for a 6 month non-invasive study follow up for carotid stenosis. The stenosis has been followed by surveillance duplexes. The patient underwent a bilateral carotid duplex scan which showed no change from the previous exam on 06-12-16. Duplex is stable at Right ICA stenosis (1-39%) and Left ICA stenosis (40-59%). Elevated velocities of the left subclavian artery. The patient denies experiencing Amaurosis Fugax, TIA like symptoms or focal motor deficits. The patient also denies any left upper extremity hand pain. Patient denies any fever, nausea or vomiting.  1. Bilateral carotid artery stenosis - Stable Studies reviewed with patient. Patient asymptomatic with stable duplex.   No intervention at this time.    Patient to return in 1 year for surveillance carotid duplex. Patient to remain abstinent of tobacco use. I have discussed with the patient at length the risk factors for and pathogenesis of atherosclerotic disease and encouraged a healthy diet, regular exercise regimen and blood pressure / glucose control.  Patient was instructed to contact our office in the interim with problems such as arm / leg weakness or numbness, speech / swallowing difficulty or temporary monocular blindness. The patient expresses their understanding.   - VAS US CAROTID; Future  2. Hyperlipidemia, unspecified hyperlipidemia type - stable Encouraged good control as its slows the progression of atherosclerotic disease  3. Prediabetes - stable Encouraged good control as its slows the progression of atherosclerotic disease  Current Outpatient Prescriptions on File Prior to Visit  Medication Sig Dispense Refill  . allopurinol (ZYLOPRIM) 300 MG tablet Take 1 tablet (300 mg total) by mouth daily. 90 tablet 4  . atorvastatin (LIPITOR) 40 MG tablet Take 1 tablet (40 mg total) by mouth daily. 90 tablet 4  . clopidogrel (PLAVIX) 75 MG tablet Take 75 mg by mouth daily.    . hydrALAZINE (APRESOLINE) 25 MG tablet Take 1 tablet (25 mg total) by mouth 3 (three) times daily. 90 tablet 4  . hydrochlorothiazide (HYDRODIURIL) 25 MG tablet Take 1 tablet (25 mg total) by mouth daily. 90 tablet 4  . hydrocortisone (ANUSOL-HC) 25 MG suppository Place 25 mg rectally 2 (two) times daily.    Marland Kitchen losartan (COZAAR) 100 MG tablet Take 1 tablet (100 mg total) by mouth daily. 90 tablet 4  . metoprolol succinate (TOPROL-XL) 25 MG 24 hr tablet Take 1 tablet (25 mg total) by mouth daily. 90 tablet 4   No current facility-administered medications on file prior to visit.     There are no Patient Instructions on file for this visit. No Follow-up on file.   KIMBERLY A STEGMAYER, PA-C

## 2017-02-19 ENCOUNTER — Other Ambulatory Visit: Payer: Self-pay | Admitting: Unknown Physician Specialty

## 2017-02-19 ENCOUNTER — Other Ambulatory Visit: Payer: Self-pay | Admitting: Family Medicine

## 2017-02-19 DIAGNOSIS — I25111 Atherosclerotic heart disease of native coronary artery with angina pectoris with documented spasm: Secondary | ICD-10-CM

## 2017-02-19 DIAGNOSIS — I10 Essential (primary) hypertension: Secondary | ICD-10-CM

## 2017-03-12 DIAGNOSIS — H2513 Age-related nuclear cataract, bilateral: Secondary | ICD-10-CM | POA: Diagnosis not present

## 2017-03-18 DIAGNOSIS — L821 Other seborrheic keratosis: Secondary | ICD-10-CM | POA: Diagnosis not present

## 2017-03-18 DIAGNOSIS — X32XXXA Exposure to sunlight, initial encounter: Secondary | ICD-10-CM | POA: Diagnosis not present

## 2017-03-18 DIAGNOSIS — L400 Psoriasis vulgaris: Secondary | ICD-10-CM | POA: Diagnosis not present

## 2017-03-18 DIAGNOSIS — L570 Actinic keratosis: Secondary | ICD-10-CM | POA: Diagnosis not present

## 2017-03-18 DIAGNOSIS — Z08 Encounter for follow-up examination after completed treatment for malignant neoplasm: Secondary | ICD-10-CM | POA: Diagnosis not present

## 2017-03-18 DIAGNOSIS — Z85820 Personal history of malignant melanoma of skin: Secondary | ICD-10-CM | POA: Diagnosis not present

## 2017-04-20 DIAGNOSIS — C61 Malignant neoplasm of prostate: Secondary | ICD-10-CM | POA: Diagnosis not present

## 2017-04-20 DIAGNOSIS — N5201 Erectile dysfunction due to arterial insufficiency: Secondary | ICD-10-CM | POA: Diagnosis not present

## 2017-04-20 DIAGNOSIS — D400 Neoplasm of uncertain behavior of prostate: Secondary | ICD-10-CM | POA: Diagnosis not present

## 2017-05-12 ENCOUNTER — Telehealth: Payer: Self-pay | Admitting: Family Medicine

## 2017-05-12 NOTE — Telephone Encounter (Signed)
Called pt to re- schedule for [facility] Visit with Nurse Health Advisor, [place], my c/b # is [phone]  Jill Alexanders

## 2017-05-19 ENCOUNTER — Ambulatory Visit: Payer: Medicare Other

## 2017-05-20 ENCOUNTER — Emergency Department: Payer: Medicare Other

## 2017-05-20 ENCOUNTER — Observation Stay: Payer: Medicare Other

## 2017-05-20 ENCOUNTER — Ambulatory Visit: Payer: Medicare Other

## 2017-05-20 ENCOUNTER — Observation Stay
Admission: EM | Admit: 2017-05-20 | Discharge: 2017-05-21 | Disposition: A | Discharge status: Home / Self Care | Payer: Medicare Other | Attending: Internal Medicine | Admitting: Internal Medicine

## 2017-05-20 ENCOUNTER — Encounter: Payer: Self-pay | Admitting: Emergency Medicine

## 2017-05-20 DIAGNOSIS — I350 Nonrheumatic aortic (valve) stenosis: Secondary | ICD-10-CM | POA: Diagnosis not present

## 2017-05-20 DIAGNOSIS — I200 Unstable angina: Secondary | ICD-10-CM | POA: Diagnosis not present

## 2017-05-20 DIAGNOSIS — I700 Atherosclerosis of aorta: Secondary | ICD-10-CM | POA: Insufficient documentation

## 2017-05-20 DIAGNOSIS — Z87891 Personal history of nicotine dependence: Secondary | ICD-10-CM | POA: Diagnosis not present

## 2017-05-20 DIAGNOSIS — R0789 Other chest pain: Secondary | ICD-10-CM | POA: Diagnosis not present

## 2017-05-20 DIAGNOSIS — Z955 Presence of coronary angioplasty implant and graft: Secondary | ICD-10-CM | POA: Diagnosis not present

## 2017-05-20 DIAGNOSIS — I2584 Coronary atherosclerosis due to calcified coronary lesion: Secondary | ICD-10-CM | POA: Insufficient documentation

## 2017-05-20 DIAGNOSIS — Z7902 Long term (current) use of antithrombotics/antiplatelets: Secondary | ICD-10-CM | POA: Insufficient documentation

## 2017-05-20 DIAGNOSIS — E785 Hyperlipidemia, unspecified: Secondary | ICD-10-CM | POA: Insufficient documentation

## 2017-05-20 DIAGNOSIS — I252 Old myocardial infarction: Secondary | ICD-10-CM | POA: Diagnosis not present

## 2017-05-20 DIAGNOSIS — L409 Psoriasis, unspecified: Secondary | ICD-10-CM | POA: Diagnosis not present

## 2017-05-20 DIAGNOSIS — I6529 Occlusion and stenosis of unspecified carotid artery: Secondary | ICD-10-CM | POA: Insufficient documentation

## 2017-05-20 DIAGNOSIS — E876 Hypokalemia: Secondary | ICD-10-CM | POA: Diagnosis present

## 2017-05-20 DIAGNOSIS — I517 Cardiomegaly: Secondary | ICD-10-CM | POA: Diagnosis not present

## 2017-05-20 DIAGNOSIS — R079 Chest pain, unspecified: Secondary | ICD-10-CM | POA: Diagnosis not present

## 2017-05-20 DIAGNOSIS — M109 Gout, unspecified: Secondary | ICD-10-CM | POA: Insufficient documentation

## 2017-05-20 DIAGNOSIS — I10 Essential (primary) hypertension: Secondary | ICD-10-CM

## 2017-05-20 DIAGNOSIS — G4730 Sleep apnea, unspecified: Secondary | ICD-10-CM | POA: Diagnosis not present

## 2017-05-20 DIAGNOSIS — R7303 Prediabetes: Secondary | ICD-10-CM | POA: Insufficient documentation

## 2017-05-20 DIAGNOSIS — I25719 Atherosclerosis of autologous vein coronary artery bypass graft(s) with unspecified angina pectoris: Secondary | ICD-10-CM | POA: Diagnosis not present

## 2017-05-20 DIAGNOSIS — I2510 Atherosclerotic heart disease of native coronary artery without angina pectoris: Secondary | ICD-10-CM | POA: Diagnosis not present

## 2017-05-20 DIAGNOSIS — I25110 Atherosclerotic heart disease of native coronary artery with unstable angina pectoris: Principal | ICD-10-CM | POA: Insufficient documentation

## 2017-05-20 LAB — BASIC METABOLIC PANEL
ANION GAP: 9 (ref 5–15)
BUN: 28 mg/dL — ABNORMAL HIGH (ref 6–20)
CALCIUM: 9.3 mg/dL (ref 8.9–10.3)
CO2: 25 mmol/L (ref 22–32)
CREATININE: 1.24 mg/dL (ref 0.61–1.24)
Chloride: 104 mmol/L (ref 101–111)
GFR calc non Af Amer: 58 mL/min — ABNORMAL LOW (ref >60)
Glucose, Bld: 130 mg/dL — ABNORMAL HIGH (ref 65–99)
Potassium: 3.3 mmol/L — ABNORMAL LOW (ref 3.5–5.1)
Sodium: 138 mmol/L (ref 135–145)

## 2017-05-20 LAB — TROPONIN I
Troponin I: <0.03 ng/mL (ref <0.03)
Troponin I: <0.03 ng/mL (ref <0.03)
Troponin I: <0.03 ng/mL (ref <0.03)
Troponin I: 0.04 ng/mL (ref <0.03)

## 2017-05-20 LAB — NM MYOCAR MULTI W/SPECT W/WALL MOTION / EF
CSEPEDS: 20 s
Estimated workload: 6.2 METS
Exercise duration (min): 4 min
LVDIAVOL: 68 mL (ref 62–150)
LVSYSVOL: 27 mL
NUC STRESS TID: 0.69
Peak HR: 134 {beats}/min
Rest HR: 73 {beats}/min
SDS: 3
SRS: 6
SSS: 6

## 2017-05-20 LAB — CBC
HEMATOCRIT: 41.2 % (ref 40.0–52.0)
Hemoglobin: 14.5 g/dL (ref 13.0–18.0)
MCH: 33.3 pg (ref 26.0–34.0)
MCHC: 35.3 g/dL (ref 32.0–36.0)
MCV: 94.4 fL (ref 80.0–100.0)
PLATELETS: 142 10*3/uL — AB (ref 150–440)
RBC: 4.37 MIL/uL — ABNORMAL LOW (ref 4.40–5.90)
RDW: 13.4 % (ref 11.5–14.5)
WBC: 7.2 10*3/uL (ref 3.8–10.6)

## 2017-05-20 LAB — HEMOGLOBIN A1C
Hgb A1c MFr Bld: 5.6 % (ref 4.8–5.6)
Mean Plasma Glucose: 114.02 mg/dL

## 2017-05-20 LAB — TSH: TSH: 2.796 u[IU]/mL (ref 0.350–4.500)

## 2017-05-20 MED ORDER — ATORVASTATIN CALCIUM 20 MG PO TABS
40.0000 mg | ORAL_TABLET | Freq: Every day | ORAL | Status: DC
Start: 2017-05-20 — End: 2017-05-21
  Administered 2017-05-20 – 2017-05-21 (×2): 40 mg via ORAL
  Filled 2017-05-20 (×2): qty 2

## 2017-05-20 MED ORDER — ENOXAPARIN SODIUM 40 MG/0.4ML ~~LOC~~ SOLN
40.0000 mg | SUBCUTANEOUS | Status: DC
  Administered 2017-05-20: 40 mg via SUBCUTANEOUS
  Filled 2017-05-20 (×2): qty 0.4

## 2017-05-20 MED ORDER — ASPIRIN 81 MG PO CHEW
324.0000 mg | CHEWABLE_TABLET | Freq: Once | ORAL | Status: AC
  Administered 2017-05-20: 324 mg via ORAL
  Filled 2017-05-20: qty 4

## 2017-05-20 MED ORDER — HYDROCORTISONE ACETATE 25 MG RE SUPP
25.0000 mg | Freq: Two times a day (BID) | RECTAL | Status: DC
  Filled 2017-05-20 (×3): qty 1

## 2017-05-20 MED ORDER — ALLOPURINOL 300 MG PO TABS
300.0000 mg | ORAL_TABLET | Freq: Every day | ORAL | Status: DC
  Administered 2017-05-21: 300 mg via ORAL
  Filled 2017-05-20 (×2): qty 1

## 2017-05-20 MED ORDER — ONDANSETRON HCL 4 MG/2ML IJ SOLN: 4.0000 mg | Freq: Four times a day (QID) | INTRAMUSCULAR | Status: DC | PRN

## 2017-05-20 MED ORDER — TECHNETIUM TC 99M TETROFOSMIN IV KIT
28.7980 | PACK | Freq: Once | INTRAVENOUS | Status: AC | PRN
  Administered 2017-05-20: 28.798 via INTRAVENOUS

## 2017-05-20 MED ORDER — ACETAMINOPHEN 650 MG RE SUPP: 650.0000 mg | Freq: Four times a day (QID) | RECTAL | Status: DC | PRN

## 2017-05-20 MED ORDER — POTASSIUM CHLORIDE CRYS ER 20 MEQ PO TBCR
40.0000 meq | EXTENDED_RELEASE_TABLET | Freq: Once | ORAL | Status: AC
  Administered 2017-05-20: 40 meq via ORAL
  Filled 2017-05-20: qty 2

## 2017-05-20 MED ORDER — SODIUM CHLORIDE 0.9 % IV SOLN
INTRAVENOUS | Status: DC
  Administered 2017-05-20 – 2017-05-21 (×3): via INTRAVENOUS

## 2017-05-20 MED ORDER — METOPROLOL SUCCINATE ER 25 MG PO TB24
25.0000 mg | ORAL_TABLET | Freq: Every day | ORAL | Status: DC
  Administered 2017-05-20 – 2017-05-21 (×2): 25 mg via ORAL
  Filled 2017-05-20 (×2): qty 1

## 2017-05-20 MED ORDER — DOCUSATE SODIUM 100 MG PO CAPS
100.0000 mg | ORAL_CAPSULE | Freq: Two times a day (BID) | ORAL | Status: DC
  Administered 2017-05-21: 100 mg via ORAL
  Filled 2017-05-20 (×3): qty 1

## 2017-05-20 MED ORDER — NITROGLYCERIN 2 % TD OINT
1.0000 [in_us] | TOPICAL_OINTMENT | Freq: Once | TRANSDERMAL | Status: AC
  Administered 2017-05-20: 1 [in_us] via TOPICAL
  Filled 2017-05-20: qty 1

## 2017-05-20 MED ORDER — ACETAMINOPHEN 325 MG PO TABS: 650.0000 mg | ORAL_TABLET | Freq: Four times a day (QID) | ORAL | Status: DC | PRN

## 2017-05-20 MED ORDER — LOSARTAN POTASSIUM 50 MG PO TABS
100.0000 mg | ORAL_TABLET | Freq: Every day | ORAL | Status: DC
  Administered 2017-05-20 – 2017-05-21 (×2): 100 mg via ORAL
  Filled 2017-05-20 (×2): qty 2

## 2017-05-20 MED ORDER — TECHNETIUM TC 99M TETROFOSMIN IV KIT
12.4000 | PACK | Freq: Once | INTRAVENOUS | Status: AC | PRN
  Administered 2017-05-20: 12.4 via INTRAVENOUS

## 2017-05-20 MED ORDER — HYDRALAZINE HCL 25 MG PO TABS
25.0000 mg | ORAL_TABLET | Freq: Three times a day (TID) | ORAL | Status: DC
  Administered 2017-05-20 – 2017-05-21 (×4): 25 mg via ORAL
  Filled 2017-05-20 (×4): qty 1

## 2017-05-20 MED ORDER — HYDROCHLOROTHIAZIDE 25 MG PO TABS
25.0000 mg | ORAL_TABLET | Freq: Every day | ORAL | Status: DC
  Administered 2017-05-20 – 2017-05-21 (×2): 25 mg via ORAL
  Filled 2017-05-20 (×2): qty 1

## 2017-05-20 MED ORDER — ASPIRIN EC 81 MG PO TBEC: 81.0000 mg | DELAYED_RELEASE_TABLET | Freq: Every day | ORAL | Status: DC

## 2017-05-20 MED ORDER — ONDANSETRON HCL 4 MG PO TABS: 4.0000 mg | ORAL_TABLET | Freq: Four times a day (QID) | ORAL | Status: DC | PRN

## 2017-05-20 MED ORDER — CLOPIDOGREL BISULFATE 75 MG PO TABS
75.0000 mg | ORAL_TABLET | Freq: Every day | ORAL | Status: DC
  Administered 2017-05-20 – 2017-05-21 (×2): 75 mg via ORAL
  Filled 2017-05-20 (×2): qty 1
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
## 2017-05-20 NOTE — Care Management Obs Status (Signed)
[place] NOTIFICATION   Patient Details  Name: KOHLER PELLERITO MRN: [id number] Date of Birth: Mar 20, 1946   Medicare Observation Status Notification Given:  Yes Notice signed, one given to patient and the other to HIM for scanning   Katrina Stack, RN 05/20/2017, 4:50 PM
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
## 2017-05-20 NOTE — Progress Notes (Signed)
Patient briefly seen and examined. We will request troponin and if this is negative then we will request Myoview. Cardiology consultation

## 2017-05-20 NOTE — Care Management Obs Status (Signed)
[place] NOTIFICATION   Patient Details  Name: OATHER MUILENBURG MRN: [id number] Date of Birth: [birth year]   Medicare Observation Status Notification Given:  Other (see comment)  Currently off the unit for testing.  Katrina Stack, RN 05/20/2017, 11:20 AM

## 2017-05-20 NOTE — Consult Note (Signed)
[place]  CARDIOLOGY CONSULT NOTE  Patient ID: Darrell [name] MRN: [id number] DOB/AGE: [birth year] 71 y.o.  Admit date: 05/20/2017 Referring Physician Dr. Benjie Karvonen Primary Physician  [facility] Primary Cardiologist Dr. [name] Reason for Consultation chest pain  HPI: Patient is a 71 year old male with history of coronary artery disease status post cardiac catheterization in 2013 revealing a 95% proximal LAD stenosis which was treated with PCI. He had a 70-75% stenosis in the RCA 20% stenosis in the midcircumflex. These were treated medically. He has been treated with beta blockers, losartan, hydralazine and hydrochlorothiazide and remains on Plavix rather than aspirin antiplatelet therapy. He presented to the emergency room with chest discomfort and neck discomfort similar to his angina. He has ruled out for myocardial infarction. Electrocardiogram revealed sinus rhythm with nonspecific ST-T wave changes. There were no injury or ischemic ST changes. He is pain-free. He is scheduled for a functional study  Review of Systems  Constitutional: Negative.   HENT: Negative.   Eyes: Negative.   Respiratory: Negative.   Cardiovascular: Positive for chest pain.  Gastrointestinal: Negative.   Genitourinary: Negative.   Musculoskeletal: Negative.   Skin: Negative.   Neurological: Negative.   Endo/Heme/Allergies: Negative.   Psychiatric/Behavioral: Negative.     Past Medical History:  Diagnosis Date  . CAD (coronary artery disease)   . Cancer [facility])    prostate  . Carotid artery stenosis   . Hyperlipidemia   . Hypertension   . Psoriasis     Family History  Problem Relation Age of Onset  . Heart attack Mother   . Diabetes Mother   . Stroke Mother   . Heart disease Mother   . Diabetes Brother     Social History   Social History  . Marital status: Married    Spouse name: N/A  . Number of children: N/A  . Years of  education: N/A   Occupational History  . Not on file.   Social History Main Topics  . Smoking status: Former Smoker    Types: Cigarettes  . Smokeless tobacco: Never Used  . Alcohol use 0.0 oz/week  . Drug use: No  . Sexual activity: Not on file   Other Topics Concern  . Not on file   Social History Narrative  . No narrative on file    Past Surgical History:  Procedure Laterality Date  . CHOLECYSTECTOMY    . CORONARY ANGIOPLASTY WITH STENT PLACEMENT    . LIVER BIOPSY  1999  . SHOULDER SURGERY    . stents       Prescriptions Prior to Admission  Medication Sig Dispense Refill Last Dose  . allopurinol (ZYLOPRIM) 300 MG tablet Take 1 tablet (300 mg total) by mouth daily. 90 tablet 4 05/19/2017 at Unknown time  . atorvastatin (LIPITOR) 40 MG tablet TAKE 1 TABLET BY MOUTH EVERY DAY 90 tablet 0 05/19/2017 at Unknown time  . clopidogrel (PLAVIX) 75 MG tablet Take 75 mg by mouth daily.   05/19/2017 at Unknown time  . hydrALAZINE (APRESOLINE) 25 MG tablet TAKE 1 TABLET(25 MG) BY MOUTH THREE TIMES DAILY 270 tablet 0 05/19/2017 at Unknown time  . hydrochlorothiazide (HYDRODIURIL) 25 MG tablet Take 1 tablet (25 mg total) by mouth daily. 90 tablet 4 05/19/2017 at Unknown time  . losartan (COZAAR) 100 MG tablet Take 1 tablet (100 mg total) by mouth daily. 90 tablet 4 05/19/2017 at Unknown time  . metoprolol succinate (TOPROL-XL) 25 MG 24  hr tablet TAKE 1 TABLET BY MOUTH EVERY DAY 90 tablet 0 05/19/2017 at Unknown time  . hydrocortisone (ANUSOL-HC) 25 MG suppository Place 25 mg rectally 2 (two) times daily.   Not Taking at Unknown time    Physical Exam: Blood pressure (!) 147/71, pulse (!) 51, temperature (!) 97.4 F (36.3 C), temperature source Oral, resp. rate 17, height 5\' 10"  (1.778 m), weight 103.6 kg (228 lb 6.4 oz), SpO2 98 %.   Wt Readings from Last 1 Encounters:  05/20/17 103.6 kg (228 lb 6.4 oz)     General appearance: alert and cooperative Head: Normocephalic, without obvious  abnormality, atraumatic Resp: clear to auscultation bilaterally Chest wall: no tenderness Cardio: regular rate and rhythm GI: soft, non-tender; bowel sounds normal; no masses,  no organomegaly Extremities: extremities normal, atraumatic, no cyanosis or edema Neurologic: Grossly normal  Labs:   Lab Results  Component Value Date   WBC 7.2 05/20/2017   HGB 14.5 05/20/2017   HCT 41.2 05/20/2017   MCV 94.4 05/20/2017   PLT 142 (L) 05/20/2017    Recent Labs Lab 05/20/17 0041  NA 138  K 3.3*  CL 104  CO2 25  BUN 28*  CREATININE 1.24  CALCIUM 9.3  GLUCOSE 130*   Lab Results  Component Value Date   CKTOTAL 102 09/02/2014   CKMB 1.0 09/02/2014   TROPONINI <0.03 05/20/2017      Radiology: No acute cardiopulmonary process EKG: Sinus rhythm and nonspecific ST-T wave changes  ASSESSMENT AND PLAN:  Patient with history of coronary disease status post PCI of the LAD with residual disease in the right and unremarkable disease in the circumflex. Ruled out for myocardial infarction. Symptoms similar to his angina concerning for ischemia. Patient proceeded with an ETT today with no high-grade ischemia although did have couplets and triplets noted during exercise. He developed chest discomfort similar to his angina in the chest.. Sestamibi images are currently pending. If there is significant ischemia, consideration for left heart catheter to evaluate his coronary anatomy versus continued medical therapy. Signed: Teodoro Spray MD, [facility] 05/20/2017, 11:50 AM

## 2017-05-20 NOTE — Progress Notes (Signed)
Patient arrived to 2A Room 237. Patient denies pain and all questions answered. Patient oriented to unit and Fall Safety Plan signed. Skin assessment completed with Andria Meuse RN and skin intact. A&Ox4, VSS, and NSR/SB on verified tele-box #[id number]. Nursing staff will continue to monitor for any changes in patient status. Earleen Reaper, RN

## 2017-05-20 NOTE — Progress Notes (Signed)
Stress sestamibi showed borderline inferior ischemia. It is of note the patient had a 34 of R percent residual stenosis in the RCA distribution at the time of his PCI of the LAD. Symptoms have both typical and atypical features. He is ruled out for myocardial infarction however given the fact he had pain similar to his angina both on presentation and during the ETT as well as borderline inferior changes and known disease in that distribution, would recommend proceeding with left heart catheter. This will be scheduled for tomorrow morning at 7:30 AM with further recommendations after this is completed. Would continue with dual antiplatelet therapy with aspirin and clopidogrel. Would continue with metoprolol. Would not place on heparin. We'll proceed with left heart catheter in the morning and make further recommendations after this is completed.

## 2017-05-21 ENCOUNTER — Encounter
Admission: EM | Disposition: A | Discharge status: Home / Self Care | Payer: Self-pay | Source: Home / Self Care | Attending: Internal Medicine

## 2017-05-21 ENCOUNTER — Encounter: Payer: Self-pay | Admitting: Cardiology

## 2017-05-21 ENCOUNTER — Observation Stay
Admit: 2017-05-21 | Discharge: 2017-05-21 | Disposition: A | Discharge status: Home / Self Care | Payer: Medicare Other | Attending: Cardiology | Admitting: Cardiology

## 2017-05-21 DIAGNOSIS — I10 Essential (primary) hypertension: Secondary | ICD-10-CM | POA: Diagnosis not present

## 2017-05-21 DIAGNOSIS — I350 Nonrheumatic aortic (valve) stenosis: Secondary | ICD-10-CM | POA: Diagnosis not present

## 2017-05-21 DIAGNOSIS — I2510 Atherosclerotic heart disease of native coronary artery without angina pectoris: Secondary | ICD-10-CM | POA: Diagnosis not present

## 2017-05-21 DIAGNOSIS — R001 Bradycardia, unspecified: Secondary | ICD-10-CM | POA: Diagnosis not present

## 2017-05-21 DIAGNOSIS — I25719 Atherosclerosis of autologous vein coronary artery bypass graft(s) with unspecified angina pectoris: Secondary | ICD-10-CM | POA: Diagnosis not present

## 2017-05-21 DIAGNOSIS — I200 Unstable angina: Secondary | ICD-10-CM | POA: Diagnosis not present

## 2017-05-21 DIAGNOSIS — I25110 Atherosclerotic heart disease of native coronary artery with unstable angina pectoris: Secondary | ICD-10-CM | POA: Diagnosis not present

## 2017-05-21 HISTORY — PX: LEFT HEART CATH AND CORONARY ANGIOGRAPHY: CATH118249

## 2017-05-21 LAB — ECHOCARDIOGRAM COMPLETE
HEIGHTINCHES: 70 in
Weight: 3603.2 oz

## 2017-05-21 SURGERY — LEFT HEART CATH AND CORONARY ANGIOGRAPHY
Anesthesia: Moderate Sedation

## 2017-05-21 MED ORDER — SODIUM CHLORIDE 0.9% FLUSH: 3.0000 mL | INTRAVENOUS | Status: DC | PRN

## 2017-05-21 MED ORDER — SODIUM CHLORIDE 0.9 % WEIGHT BASED INFUSION
3.0000 mL/kg/h | INTRAVENOUS | Status: DC
  Administered 2017-05-21: 3 mL/kg/h via INTRAVENOUS

## 2017-05-21 MED ORDER — NITROGLYCERIN 0.4 MG SL SUBL: 0.4000 mg | SUBLINGUAL_TABLET | SUBLINGUAL | 0 refills | Status: DC | PRN

## 2017-05-21 MED ORDER — SODIUM CHLORIDE 0.9% FLUSH: 3.0000 mL | Freq: Two times a day (BID) | INTRAVENOUS | Status: DC

## 2017-05-21 MED ORDER — HEPARIN (PORCINE) IN NACL 2-0.9 UNIT/ML-% IJ SOLN
INTRAMUSCULAR | Status: AC
  Filled 2017-05-21: qty 1000

## 2017-05-21 MED ORDER — FENTANYL CITRATE (PF) 100 MCG/2ML IJ SOLN
INTRAMUSCULAR | Status: AC
  Filled 2017-05-21: qty 2

## 2017-05-21 MED ORDER — IOPAMIDOL (ISOVUE-300) INJECTION 61%
INTRAVENOUS | Status: DC | PRN
  Administered 2017-05-21: 100 mL via INTRA_ARTERIAL

## 2017-05-21 MED ORDER — SODIUM CHLORIDE 0.9 % WEIGHT BASED INFUSION: 1.0000 mL/kg/h | INTRAVENOUS | Status: DC

## 2017-05-21 MED ORDER — ASPIRIN 81 MG PO TBEC: 81.0000 mg | DELAYED_RELEASE_TABLET | Freq: Every day | ORAL | Status: DC

## 2017-05-21 MED ORDER — ASPIRIN 81 MG PO CHEW
81.0000 mg | CHEWABLE_TABLET | ORAL | Status: AC
  Administered 2017-05-21: 81 mg via ORAL

## 2017-05-21 MED ORDER — SODIUM CHLORIDE 0.9 % IV SOLN: 250.0000 mL | INTRAVENOUS | Status: DC | PRN

## 2017-05-21 MED ORDER — ASPIRIN 81 MG PO CHEW
CHEWABLE_TABLET | ORAL | Status: AC
  Filled 2017-05-21: qty 1

## 2017-05-21 MED ORDER — ONDANSETRON HCL 4 MG/2ML IJ SOLN: 4.0000 mg | Freq: Four times a day (QID) | INTRAMUSCULAR | Status: DC | PRN

## 2017-05-21 MED ORDER — MIDAZOLAM HCL 2 MG/2ML IJ SOLN
INTRAMUSCULAR | Status: DC | PRN
Start: 2017-05-21 — End: 2017-05-21
  Administered 2017-05-21: 2 mg via INTRAVENOUS

## 2017-05-21 MED ORDER — MIDAZOLAM HCL 2 MG/2ML IJ SOLN
INTRAMUSCULAR | Status: AC
  Filled 2017-05-21: qty 2

## 2017-05-21 MED ORDER — ACETAMINOPHEN 325 MG PO TABS: 650.0000 mg | ORAL_TABLET | ORAL | Status: DC | PRN

## 2017-05-21 SURGICAL SUPPLY — 10 items
CATH 5FR JL4 DIAGNOSTIC (CATHETERS) ×3 IMPLANT
CATH INFINITI 5FR ANG PIGTAIL (CATHETERS) ×3 IMPLANT
CATH INFINITI JR4 5F (CATHETERS) ×3 IMPLANT
DEVICE CLOSURE MYNXGRIP 5F (Vascular Products) IMPLANT
KIT MANI 3VAL PERCEP (MISCELLANEOUS) ×3 IMPLANT
NEEDLE PERC 18GX7CM (NEEDLE) ×3 IMPLANT
PACK CARDIAC CATH (CUSTOM PROCEDURE TRAY) ×3 IMPLANT
SHEATH AVANTI 5FR X 11CM (SHEATH) ×3 IMPLANT
WIRE EMERALD 3MM-J .035X150CM (WIRE) ×3 IMPLANT
WIRE EMERALD ST .035X150CM (WIRE) ×3 IMPLANT

## 2017-05-21 NOTE — Progress Notes (Signed)
Darrell [name] was admitted to the [facility] on 05/20/2017 and Discharged  05/21/2017 and should be excused from work/school   for 18  days starting 05/20/2017 , may return to work/school without any restrictions.  Call Bettey Costa MD with questions.  Jennilyn Esteve M.D on 05/21/2017,at 11:16 AM  [place] at [facility]  [phone]

## 2017-05-21 NOTE — Progress Notes (Signed)
Patient not available for AM vitals. Will resume around noon. Wenda Low [facility]

## 2017-05-21 NOTE — Progress Notes (Signed)
Patient off unit for cardiac procedure. Will resume care upon return. Darrell [name] [facility]

## 2017-05-21 NOTE — Progress Notes (Signed)
[place] at [place] NAME: Darrell [name]    MR#:  [id number]  DATE OF BIRTH:  1946-08-08  SUBJECTIVE:   Patient seen in cath lab this am going for cath this am No chest pain/neck pain overnight  REVIEW OF SYSTEMS:    Review of Systems  Constitutional: Negative for fever, chills weight loss HENT: Negative for ear pain, nosebleeds, congestion, facial swelling, rhinorrhea, neck pain, neck stiffness and ear discharge.   Respiratory: Negative for cough, shortness of breath, wheezing  Cardiovascular: Negative for chest pain, palpitations and leg swelling.  Gastrointestinal: Negative for heartburn, abdominal pain, vomiting, diarrhea or consitpation Genitourinary: Negative for dysuria, urgency, frequency, hematuria Musculoskeletal: Negative for back pain or joint pain Neurological: Negative for dizziness, seizures, syncope, focal weakness,  numbness and headaches.  Hematological: Does not bruise/bleed easily.  Psychiatric/Behavioral: Negative for hallucinations, confusion, dysphoric mood    Tolerating Diet: NPO      DRUG ALLERGIES:  No Known Allergies  VITALS:  Blood pressure (!) 167/88, pulse 72, temperature 98.1 F (36.7 C), temperature source Oral, resp. rate 18, height 5\' 10"  (1.778 m), weight 102.2 kg (225 lb 3.2 oz), SpO2 98 %.  PHYSICAL EXAMINATION:  Constitutional: Appears well-developed and well-nourished. No distress. HENT: Normocephalic. Marland Kitchen Oropharynx is clear and moist.  Eyes: Conjunctivae and EOM are normal. PERRLA, no scleral icterus.  Neck: Normal ROM. Neck supple. No JVD. No tracheal deviation. CVS: RRR, S1/S2 +, 2/6 SEM  no gallops, no carotid bruit.  Pulmonary: Effort and breath sounds normal, no stridor, rhonchi, wheezes, rales.  Abdominal: Soft. BS +,  no distension, tenderness, rebound or guarding.  Musculoskeletal: Normal range of motion. No edema and no tenderness.  Neuro: Alert. CN 2-12 grossly intact. No  focal deficits. Skin: Skin is warm and dry. No rash noted. Psychiatric: Normal mood and affect.      LABORATORY PANEL:   CBC  Recent Labs Lab 05/20/17 0041  WBC 7.2  HGB 14.5  HCT 41.2  PLT 142*   ------------------------------------------------------------------------------------------------------------------  Chemistries   Recent Labs Lab 05/20/17 0041  NA 138  K 3.3*  CL 104  CO2 25  GLUCOSE 130*  BUN 28*  CREATININE 1.24  CALCIUM 9.3   ------------------------------------------------------------------------------------------------------------------  Cardiac Enzymes  Recent Labs Lab 05/20/17 0702 05/20/17 1306 05/20/17 1855  TROPONINI <0.03 <0.03 0.04*   ------------------------------------------------------------------------------------------------------------------  RADIOLOGY:  Nm Myocar Multi W/spect W/wall Motion / Ef  Result Date: 05/20/2017  Defect 1: There is a small defect of mild severity present in the mid inferolateral location.  This is an intermediate risk study.  The left ventricular ejection fraction is normal (55-65%).  There was no ST segment deviation noted during stress.  No T wave inversion was noted during stress.  Borderline inferolateral ischemia.   Dg Chest Port 1 View  Result Date: 05/20/2017 CLINICAL DATA:  Acute onset throat tightness and iron taste. History of prostate cancer, hypertension. EXAM: PORTABLE CHEST 1 VIEW COMPARISON:  Chest radiograph September 02, 2014 FINDINGS: Mild cardiomegaly. Tortuous calcified aorta. No pleural effusion or focal consolidation. No pneumothorax. Osseous structures are nonsuspicious. IMPRESSION: Mild cardiomegaly.  No acute pulmonary process. Aortic Atherosclerosis (ICD10-I70.0). Electronically Signed   By: Elon Alas M.D.   On: 05/20/2017 04:13     ASSESSMENT AND PLAN:   71 year old male with history of CAD who presented with pain and pressure in the neck which was similar to his  previous MI in 2013.  1. Unstable angina/CAD: Patient will undergo  cardiac catheterization and echocardiogram. Further management after cardiac catheterization Continue aspirin, Plavix and metoprolol.  2. Essential hypertension: Continue hydralazine, metoprolol, losartan and HCTZ  3. Hyperlipidemia: Continue statin LDL from May 2018 is 47     D/w dr fath  Management plans discussed with the patient and he is in agreement.  CODE STATUS: full  TOTAL TIME TAKING CARE OF THIS PATIENT: 30 minutes.     POSSIBLE D/C 1 days, DEPENDING ON cardiac catheterization results  Mirela Parsley M.D on 05/21/2017 at 9:24 AM  Between 7am to 6pm - Pager - [phone] After 6pm go to [email or web address] - password [email or web address] [place] [facility]  Office  [phone]  CC: Primary care physician; Guadalupe Maple, MD  Note: This dictation was prepared with Dragon dictation along with smaller phrase technology. Any transcriptional errors that result from this process are unintentional.

## 2017-05-21 NOTE — Discharge Summary (Signed)
[place] at [place] NAME: Darrell [name]    MR#:  [id number]  DATE OF BIRTH:  12-03-1945  DATE OF ADMISSION:  05/20/2017 ADMITTING PHYSICIAN: Harrie Foreman, MD  DATE OF DISCHARGE: 05/21/2017  PRIMARY CARE PHYSICIAN: Guadalupe Maple, MD    ADMISSION DIAGNOSIS:  Hypokalemia [E87.6] Essential hypertension [I10] Chest pain, unspecified type [R07.9]  DISCHARGE DIAGNOSIS:  Active Problems:   Chest pain   SECONDARY DIAGNOSIS:   Past Medical History:  Diagnosis Date  . CAD (coronary artery disease)   . Cancer [facility])    prostate  . Carotid artery stenosis   . Hyperlipidemia   . Hypertension   . Psoriasis     HOSPITAL COURSE:   71 year old male with history of CAD who presented with pain and pressure in the neck which was similar to his previous MI in 2013.  1. Unstable angina/CAD: Due to abnormal stress test patient underwent cardiac catheterization. Cardiac catheterization showed results as stated below   Ost RCA lesion, 99 %stenosed.  Prox RCA lesion, 70 %stenosed.  Mid RCA lesion, 90 %stenosed.  Dist RCA lesion, 95 %stenosed.  Mid LAD-2 lesion, 50 %stenosed.  Mid LAD-1 lesion, 15 %stenosed.   Heavily calcified ostial , proximal and mid to distal rca with 99% ostial stenosis. LAD proximal stent patent with 50-60% stenosis distal to stent Circumflex without significant disease.  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 Atherectomy of ostial calcified rca vs avr/cabg based on echo data from aortic valve  The plan is for the patient to be discharged home today. He will have outpatient follow-up for atherectomy of ostial calcified RCA.  Echocardiogram shows normal ejection fraction and mild sclerosis of the aortic valve   2. Essential hypertension: Continue hydralazine, metoprolol, losartan and HCTZ  3. Hyperlipidemia: Continue statin LDL from May 2018 is 39   DISCHARGE CONDITIONS AND DIET:   Stable for  discharge on cardiac diet  CONSULTS OBTAINED:  Treatment Team:  Teodoro Spray, MD  DRUG ALLERGIES:  No Known Allergies  DISCHARGE MEDICATIONS:   Current Discharge Medication List    START taking these medications   Details  aspirin EC 81 MG EC tablet Take 1 tablet (81 mg total) by mouth daily.    nitroGLYCERIN (NITROSTAT) 0.4 MG SL tablet Place 1 tablet (0.4 mg total) under the tongue every 5 (five) minutes as needed for chest pain. Qty: 30 tablet, Refills: 0      CONTINUE these medications which have NOT CHANGED   Details  allopurinol (ZYLOPRIM) 300 MG tablet Take 1 tablet (300 mg total) by mouth daily. Qty: 90 tablet, Refills: 4    atorvastatin (LIPITOR) 40 MG tablet TAKE 1 TABLET BY MOUTH EVERY DAY Qty: 90 tablet, Refills: 0   Associated Diagnoses: Coronary artery disease involving native coronary artery of native heart with angina pectoris with documented spasm (HCC)    clopidogrel (PLAVIX) 75 MG tablet Take 75 mg by mouth daily.    hydrALAZINE (APRESOLINE) 25 MG tablet TAKE 1 TABLET(25 MG) BY MOUTH THREE TIMES DAILY Qty: 270 tablet, Refills: 0   Associated Diagnoses: Essential hypertension    hydrochlorothiazide (HYDRODIURIL) 25 MG tablet Take 1 tablet (25 mg total) by mouth daily. Qty: 90 tablet, Refills: 4   Associated Diagnoses: Essential hypertension    losartan (COZAAR) 100 MG tablet Take 1 tablet (100 mg total) by mouth daily. Qty: 90 tablet, Refills: 4   Associated Diagnoses: Essential hypertension    metoprolol succinate (TOPROL-XL) 25 MG  24 hr tablet TAKE 1 TABLET BY MOUTH EVERY DAY Qty: 90 tablet, Refills: 0    hydrocortisone (ANUSOL-HC) 25 MG suppository Place 25 mg rectally 2 (two) times daily.          Today   CHIEF COMPLAINT:  Patient doing well this when. No neck pain or pressure no chest pain no shortness of breath   VITAL SIGNS:  Blood pressure (!) 143/94, pulse (!) 44, temperature 97.9 F (36.6 C), temperature source Oral, resp.  rate 12, height 5\' 10"  (1.778 m), weight 102.2 kg (225 lb 3.2 oz), SpO2 97 %.   REVIEW OF SYSTEMS:  Review of Systems  Constitutional: Negative.  Negative for chills, fever and malaise/fatigue.  HENT: Negative.  Negative for ear discharge, ear pain, hearing loss, nosebleeds and sore throat.   Eyes: Negative.  Negative for blurred vision and pain.  Respiratory: Negative.  Negative for cough, hemoptysis, shortness of breath and wheezing.   Cardiovascular: Negative.  Negative for chest pain, palpitations and leg swelling.  Gastrointestinal: Negative.  Negative for abdominal pain, blood in stool, diarrhea, nausea and vomiting.  Genitourinary: Negative.  Negative for dysuria.  Musculoskeletal: Negative.  Negative for back pain.  Skin: Negative.   Neurological: Negative for dizziness, tremors, speech change, focal weakness, seizures and headaches.  Endo/Heme/Allergies: Negative.  Does not bruise/bleed easily.  Psychiatric/Behavioral: Negative.  Negative for depression, hallucinations and suicidal ideas.     PHYSICAL EXAMINATION:  GENERAL:  71 y.o.-year-old patient lying in the bed with no acute distress.  NECK:  Supple, no jugular venous distention. No thyroid enlargement, no tenderness.  LUNGS: Normal breath sounds bilaterally, no wheezing, rales,rhonchi  No use of accessory muscles of respiration.  CARDIOVASCULAR: S1, S2 normal. No murmurs, rubs, or gallops.  ABDOMEN: Soft, non-tender, non-distended. Bowel sounds present. No organomegaly or mass.  EXTREMITIES: No pedal edema, cyanosis, or clubbing.  PSYCHIATRIC: The patient is alert and oriented x 3.  SKIN: No obvious rash, lesion, or ulcer.   DATA REVIEW:   CBC  Recent Labs Lab 05/20/17 0041  WBC 7.2  HGB 14.5  HCT 41.2  PLT 142*    Chemistries   Recent Labs Lab 05/20/17 0041  NA 138  K 3.3*  CL 104  CO2 25  GLUCOSE 130*  BUN 28*  CREATININE 1.24  CALCIUM 9.3    Cardiac Enzymes  Recent Labs Lab  05/20/17 0702 05/20/17 1306 05/20/17 1855  TROPONINI <0.03 <0.03 0.04*    Microbiology Results  @[email or web address] @  RADIOLOGY:  Nm Myocar Multi W/spect W/wall Motion / Ef  Result Date: 05/20/2017  Defect 1: There is a small defect of mild severity present in the mid inferolateral location.  This is an intermediate risk study.  The left ventricular ejection fraction is normal (55-65%).  There was no ST segment deviation noted during stress.  No T wave inversion was noted during stress.  Borderline inferolateral ischemia.   Dg Chest Port 1 View  Result Date: 05/20/2017 CLINICAL DATA:  Acute onset throat tightness and iron taste. History of prostate cancer, hypertension. EXAM: PORTABLE CHEST 1 VIEW COMPARISON:  Chest radiograph September 02, 2014 FINDINGS: Mild cardiomegaly. Tortuous calcified aorta. No pleural effusion or focal consolidation. No pneumothorax. Osseous structures are nonsuspicious. IMPRESSION: Mild cardiomegaly.  No acute pulmonary process. Aortic Atherosclerosis (ICD10-I70.0). Electronically Signed   By: Elon Alas M.D.   On: 05/20/2017 04:13      Current Discharge Medication List    START taking these medications   Details  aspirin EC  81 MG EC tablet Take 1 tablet (81 mg total) by mouth daily.    nitroGLYCERIN (NITROSTAT) 0.4 MG SL tablet Place 1 tablet (0.4 mg total) under the tongue every 5 (five) minutes as needed for chest pain. Qty: 30 tablet, Refills: 0      CONTINUE these medications which have NOT CHANGED   Details  allopurinol (ZYLOPRIM) 300 MG tablet Take 1 tablet (300 mg total) by mouth daily. Qty: 90 tablet, Refills: 4    atorvastatin (LIPITOR) 40 MG tablet TAKE 1 TABLET BY MOUTH EVERY DAY Qty: 90 tablet, Refills: 0   Associated Diagnoses: Coronary artery disease involving native coronary artery of native heart with angina pectoris with documented spasm (HCC)    clopidogrel (PLAVIX) 75 MG tablet Take 75 mg by mouth daily.    hydrALAZINE  (APRESOLINE) 25 MG tablet TAKE 1 TABLET(25 MG) BY MOUTH THREE TIMES DAILY Qty: 270 tablet, Refills: 0   Associated Diagnoses: Essential hypertension    hydrochlorothiazide (HYDRODIURIL) 25 MG tablet Take 1 tablet (25 mg total) by mouth daily. Qty: 90 tablet, Refills: 4   Associated Diagnoses: Essential hypertension    losartan (COZAAR) 100 MG tablet Take 1 tablet (100 mg total) by mouth daily. Qty: 90 tablet, Refills: 4   Associated Diagnoses: Essential hypertension    metoprolol succinate (TOPROL-XL) 25 MG 24 hr tablet TAKE 1 TABLET BY MOUTH EVERY DAY Qty: 90 tablet, Refills: 0    hydrocortisone (ANUSOL-HC) 25 MG suppository Place 25 mg rectally 2 (two) times daily.          Management plans discussed with the patient and he is in agreement. Stable for discharge home  Patient should follow up with cardiology for atherectomy  CODE STATUS:     Code Status Orders        Start     Ordered   05/21/17 1055  Full code  Continuous     05/21/17 1054    Code Status History    Date Active Date Inactive Code Status Order ID Comments User Context   05/20/2017  5:02 AM 05/21/2017 10:54 AM Full Code [id number]  Harrie Foreman, MD Inpatient      TOTAL TIME TAKING CARE OF THIS PATIENT: 37 minutes.    Note: This dictation was prepared with Dragon dictation along with smaller phrase technology. Any transcriptional errors that result from this process are unintentional.  Adriana Lina M.D on 05/21/2017 at 11:20 AM  Between 7am to 6pm - Pager - [phone] After 6pm go to [email or web address] - password [email or web address] [place] [facility]  Office  [phone]  CC: Primary care physician; Guadalupe Maple, MD

## 2017-05-21 NOTE — Progress Notes (Signed)
CCMD reports complete heart block on monitor. Pt telemetry shows sinus brady. MD Jannifer Franklin made aware, STAT EKG ordered. Pt asymptomatic. Will continue to monitor.

## 2017-05-21 NOTE — Progress Notes (Signed)
Trop reported 0.04. MD made aware. New order for 81 asa.  Pt asymptomatic. Will continue to monitor.

## 2017-05-21 NOTE — Progress Notes (Signed)
Discussed cardiac catheter film with [facility]. Patient will require consideration for orbital atherectomy of the ostial RCA with further consideration for PCI in the proximal and mid to distal RCA. Will discharge patient to home on current regimen. No work or physical activity until study can be completed. This will be carried out at [facility] on 05/27/2017. Echocardiogram done revealed no significant aortic stenosis. Okay to discharge from cardiac standpoint.

## 2017-05-21 NOTE — Progress Notes (Signed)
*  PRELIMINARY RESULTS* Echocardiogram 2D Echocardiogram has been performed.  Darrell [name] 05/21/2017, 10:20 AM

## 2017-05-27 ENCOUNTER — Inpatient Hospital Stay: Payer: Medicare Other | Admitting: Family Medicine

## 2017-05-27 DIAGNOSIS — I770 Arteriovenous fistula, acquired: Secondary | ICD-10-CM | POA: Diagnosis not present

## 2017-05-27 DIAGNOSIS — Z79899 Other long term (current) drug therapy: Secondary | ICD-10-CM | POA: Diagnosis not present

## 2017-05-27 DIAGNOSIS — Z01810 Encounter for preprocedural cardiovascular examination: Secondary | ICD-10-CM | POA: Diagnosis not present

## 2017-05-27 DIAGNOSIS — I10 Essential (primary) hypertension: Secondary | ICD-10-CM | POA: Diagnosis not present

## 2017-05-27 DIAGNOSIS — I2510 Atherosclerotic heart disease of native coronary artery without angina pectoris: Secondary | ICD-10-CM | POA: Diagnosis not present

## 2017-05-27 DIAGNOSIS — E782 Mixed hyperlipidemia: Secondary | ICD-10-CM | POA: Diagnosis not present

## 2017-05-27 DIAGNOSIS — G4733 Obstructive sleep apnea (adult) (pediatric): Secondary | ICD-10-CM | POA: Insufficient documentation

## 2017-05-27 DIAGNOSIS — I25118 Atherosclerotic heart disease of native coronary artery with other forms of angina pectoris: Secondary | ICD-10-CM | POA: Diagnosis not present

## 2017-05-28 DIAGNOSIS — Z01810 Encounter for preprocedural cardiovascular examination: Secondary | ICD-10-CM | POA: Diagnosis not present

## 2017-05-28 DIAGNOSIS — I2510 Atherosclerotic heart disease of native coronary artery without angina pectoris: Secondary | ICD-10-CM | POA: Diagnosis not present

## 2017-05-28 DIAGNOSIS — Z79899 Other long term (current) drug therapy: Secondary | ICD-10-CM | POA: Diagnosis not present

## 2017-05-28 DIAGNOSIS — I10 Essential (primary) hypertension: Secondary | ICD-10-CM | POA: Diagnosis not present

## 2017-05-28 DIAGNOSIS — E782 Mixed hyperlipidemia: Secondary | ICD-10-CM | POA: Diagnosis not present

## 2017-06-03 ENCOUNTER — Encounter: Payer: Medicare Other | Admitting: Family Medicine

## 2017-06-03 ENCOUNTER — Ambulatory Visit (INDEPENDENT_AMBULATORY_CARE_PROVIDER_SITE_OTHER): Payer: Medicare Other

## 2017-06-03 VITALS — BP 128/71 | HR 55 | Temp 97.7°F | Resp 15 | Ht 69.0 in | Wt 222.7 lb

## 2017-06-03 DIAGNOSIS — Z0000 Encounter for general adult medical examination without abnormal findings: Secondary | ICD-10-CM

## 2017-06-03 NOTE — Patient Instructions (Signed)
Darrell [name] , Thank you for taking time to come for your Medicare Wellness Visit. I appreciate your ongoing commitment to your health goals. Please review the following plan we discussed and let me know if I can assist you in the future.   Screening recommendations/referrals: Colonoscopy: completed 06/11/2014 Recommended yearly ophthalmology/optometry visit for glaucoma screening and checkup Recommended yearly dental visit for hygiene and checkup  Vaccinations: Influenza vaccine: up to date  Pneumococcal vaccine: up to date Tdap vaccine: up to date Shingles vaccine: due, check with your insurance company for coverage  Advanced directives: Please bring a copy of your health care power of attorney and living will to the office at your convenience.  Conditions/risks identified: Recommend drinking at least 7-8 glasses of water a day  Next appointment: Follow up on 06/15/2017 at 1:30pm with Dr.Crissman. FOllow up in one year for your annual wellness exam.   Preventive Care 70 Years and Older, Male Preventive care refers to lifestyle choices and visits with your health care provider that can promote health and wellness. What does preventive care include?  A yearly physical exam. This is also called an annual well check.  Dental exams once or twice a year.  Routine eye exams. Ask your health care provider how often you should have your eyes checked.  Personal lifestyle choices, including:  Daily care of your teeth and gums.  Regular physical activity.  Eating a healthy diet.  Avoiding tobacco and drug use.  Limiting alcohol use.  Practicing safe sex.  Taking low doses of aspirin every day.  Taking vitamin and mineral supplements as recommended by your health care provider. What happens during an annual well check? The services and screenings done by your health care provider during your annual well check will depend on your age, overall health, lifestyle risk factors, and  family history of disease. Counseling  Your health care provider may ask you questions about your:  Alcohol use.  Tobacco use.  Drug use.  Emotional well-being.  Home and relationship well-being.  Sexual activity.  Eating habits.  History of falls.  Memory and ability to understand (cognition).  Work and work Statistician. Screening  You may have the following tests or measurements:  Height, weight, and BMI.  Blood pressure.  Lipid and cholesterol levels. These may be checked every 5 years, or more frequently if you are over 70 years old.  Skin check.  Lung cancer screening. You may have this screening every year starting at age 70 if you have a 30-pack-year history of smoking and currently smoke or have quit within the past 15 years.  Fecal occult blood test (FOBT) of the stool. You may have this test every year starting at age 70.  Flexible sigmoidoscopy or colonoscopy. You may have a sigmoidoscopy every 5 years or a colonoscopy every 10 years starting at age 70.  Prostate cancer screening. Recommendations will vary depending on your family history and other risks.  Hepatitis C blood test.  Hepatitis B blood test.  Sexually transmitted disease (STD) testing.  Diabetes screening. This is done by checking your blood sugar (glucose) after you have not eaten for a while (fasting). You may have this done every 1-3 years.  Abdominal aortic aneurysm (AAA) screening. You may need this if you are a current or former smoker.  Osteoporosis. You may be screened starting at age 70 if you are at high risk. Talk with your health care provider about your test results, treatment options, and if necessary, the need  for more tests. Vaccines  Your health care provider may recommend certain vaccines, such as:  Influenza vaccine. This is recommended every year.  Tetanus, diphtheria, and acellular pertussis (Tdap, Td) vaccine. You may need a Td booster every 10 years.  Zoster  vaccine. You may need this after age 70.  Pneumococcal 13-valent conjugate (PCV13) vaccine. One dose is recommended after age 70.  Pneumococcal polysaccharide (PPSV23) vaccine. One dose is recommended after age 70. Talk to your health care provider about which screenings and vaccines you need and how often you need them. This information is not intended to replace advice given to you by your health care provider. Make sure you discuss any questions you have with your health care provider. Document Released: 08/30/2015 Document Revised: 04/22/2016 Document Reviewed: 06/04/2015 Elsevier Interactive Patient Education  2017 [place] Prevention in the Home Falls can cause injuries. They can happen to people of all ages. There are many things you can do to make your home safe and to help prevent falls. What can I do on the outside of my home?  Regularly fix the edges of walkways and driveways and fix any cracks.  Remove anything that might make you trip as you walk through a door, such as a raised step or threshold.  Trim any bushes or trees on the path to your home.  Use bright outdoor lighting.  Clear any walking paths of anything that might make someone trip, such as rocks or tools.  Regularly check to see if handrails are loose or broken. Make sure that both sides of any steps have handrails.  Any raised decks and porches should have guardrails on the edges.  Have any leaves, snow, or ice cleared regularly.  Use sand or salt on walking paths during winter.  Clean up any spills in your garage right away. This includes oil or grease spills. What can I do in the bathroom?  Use night lights.  Install grab bars by the toilet and in the tub and shower. Do not use towel bars as grab bars.  Use non-skid mats or decals in the tub or shower.  If you need to sit down in the shower, use a plastic, non-slip stool.  Keep the floor dry. Clean up any water that spills on the  floor as soon as it happens.  Remove soap buildup in the tub or shower regularly.  Attach bath mats securely with double-sided non-slip rug tape.  Do not have throw rugs and other things on the floor that can make you trip. What can I do in the bedroom?  Use night lights.  Make sure that you have a light by your bed that is easy to reach.  Do not use any sheets or blankets that are too big for your bed. They should not hang down onto the floor.  Have a firm chair that has side arms. You can use this for support while you get dressed.  Do not have throw rugs and other things on the floor that can make you trip. What can I do in the kitchen?  Clean up any spills right away.  Avoid walking on wet floors.  Keep items that you use a lot in easy-to-reach places.  If you need to reach something above you, use a strong step stool that has a grab bar.  Keep electrical cords out of the way.  Do not use floor polish or wax that makes floors slippery. If you must use wax, use  non-skid floor wax.  Do not have throw rugs and other things on the floor that can make you trip. What can I do with my stairs?  Do not leave any items on the stairs.  Make sure that there are handrails on both sides of the stairs and use them. Fix handrails that are broken or loose. Make sure that handrails are as long as the stairways.  Check any carpeting to make sure that it is firmly attached to the stairs. Fix any carpet that is loose or worn.  Avoid having throw rugs at the top or bottom of the stairs. If you do have throw rugs, attach them to the floor with carpet tape.  Make sure that you have a light switch at the top of the stairs and the bottom of the stairs. If you do not have them, ask someone to add them for you. What else can I do to help prevent falls?  Wear shoes that:  Do not have high heels.  Have rubber bottoms.  Are comfortable and fit you well.  Are closed at the toe. Do not wear  sandals.  If you use a stepladder:  Make sure that it is fully opened. Do not climb a closed stepladder.  Make sure that both sides of the stepladder are locked into place.  Ask someone to hold it for you, if possible.  Clearly mark and make sure that you can see:  Any grab bars or handrails.  First and last steps.  Where the edge of each step is.  Use tools that help you move around (mobility aids) if they are needed. These include:  Canes.  Walkers.  Scooters.  Crutches.  Turn on the lights when you go into a dark area. Replace any light bulbs as soon as they burn out.  Set up your furniture so you have a clear path. Avoid moving your furniture around.  If any of your floors are uneven, fix them.  If there are any pets around you, be aware of where they are.  Review your medicines with your doctor. Some medicines can make you feel dizzy. This can increase your chance of falling. Ask your doctor what other things that you can do to help prevent falls. This information is not intended to replace advice given to you by your health care provider. Make sure you discuss any questions you have with your health care provider. Document Released: 05/30/2009 Document Revised: 01/09/2016 Document Reviewed: 09/07/2014 Elsevier Interactive Patient Education  2017 [employer].

## 2017-06-03 NOTE — Progress Notes (Addendum)
Subjective:   Darrell [name] is a 71 y.o. male who presents for Medicare Annual/Subsequent preventive examination.  Review of Systems:  Cardiac Risk Factors include: male gender;advanced age (>72men, >10 women);hypertension;obesity (BMI >30kg/m2)     Objective:    Vitals: BP 128/71 (BP Location: Left Arm, Patient Position: Sitting)   Pulse (!) 55   Temp 97.7 F (36.5 C) (Oral)   Resp 15   Ht 5\' 9"  (1.753 m)   Wt 222 lb 11.2 oz (101 kg)   BMI 32.89 kg/m   Body mass index is 32.89 kg/m.  Tobacco History  Smoking Status  . Former Smoker  . Types: Cigarettes  Smokeless Tobacco  . Never Used    Comment: quit 30 years ago      Counseling given: Not Answered   Past Medical History:  Diagnosis Date  . CAD (coronary artery disease)   . Cancer [facility])    prostate  . Carotid artery stenosis   . Hyperlipidemia   . Hypertension   . Psoriasis    Past Surgical History:  Procedure Laterality Date  . CHOLECYSTECTOMY    . CORONARY ANGIOPLASTY WITH STENT PLACEMENT    . LEFT HEART CATH AND CORONARY ANGIOGRAPHY N/A 05/21/2017   Procedure: LEFT HEART CATH AND CORONARY ANGIOGRAPHY;  Surgeon: Teodoro Spray, MD;  Location: [facility];  Service: Cardiovascular;  Laterality: N/A;  . LIVER BIOPSY  1999  . SHOULDER SURGERY    . stents     Family History  Problem Relation Age of Onset  . Heart attack Mother   . Diabetes Mother   . Stroke Mother   . Heart disease Mother   . Diabetes Brother    History  Sexual Activity  . Sexual activity: Not on file    Outpatient Encounter Prescriptions as of 06/03/2017  Medication Sig  . allopurinol (ZYLOPRIM) 300 MG tablet Take 1 tablet (300 mg total) by mouth daily.  Marland Kitchen aspirin EC 81 MG EC tablet Take 1 tablet (81 mg total) by mouth daily.  Marland Kitchen atorvastatin (LIPITOR) 40 MG tablet TAKE 1 TABLET BY MOUTH EVERY DAY  . clopidogrel (PLAVIX) 75 MG tablet Take 75 mg by mouth daily.  . Guselkumab (TREMFYA) 100 MG/ML SOSY Inject 1 mL  into the skin every 30 (thirty) days. Every 2 months  . hydrALAZINE (APRESOLINE) 25 MG tablet TAKE 1 TABLET(25 MG) BY MOUTH THREE TIMES DAILY  . hydrochlorothiazide (HYDRODIURIL) 25 MG tablet Take 1 tablet (25 mg total) by mouth daily.  Marland Kitchen losartan (COZAAR) 100 MG tablet Take 1 tablet (100 mg total) by mouth daily.  . metoprolol succinate (TOPROL-XL) 25 MG 24 hr tablet TAKE 1 TABLET BY MOUTH EVERY DAY  . nitroGLYCERIN (NITROSTAT) 0.4 MG SL tablet Place 1 tablet (0.4 mg total) under the tongue every 5 (five) minutes as needed for chest pain. (Patient not taking: Reported on 06/03/2017)  . [DISCONTINUED] hydrocortisone (ANUSOL-HC) 25 MG suppository Place 25 mg rectally 2 (two) times daily.   No facility-administered encounter medications on file as of 06/03/2017.     Activities of Daily Living In your present state of health, do you have any difficulty performing the following activities: 06/03/2017 05/20/2017  Hearing? N N  Vision? N N  Difficulty concentrating or making decisions? Y N  Walking or climbing stairs? N N  Dressing or bathing? N N  Doing errands, shopping? N N  Preparing Food and eating ? N -  Using the Toilet? N -  In the  past six months, have you accidently leaked urine? N -  Do you have problems with loss of bowel control? N -  Managing your Medications? N -  Managing your Finances? N -  Housekeeping or managing your Housekeeping? N -  Some recent data might be hidden    Patient Care Team: Guadalupe Maple, MD as PCP - General (Family Medicine) Lucky Cowboy, Erskine Squibb, MD as Referring Physician (Vascular Surgery) Royston Cowper, MD (Urology) Corey Skains, MD as Consulting Physician (Internal Medicine) Dasher, Rayvon Char, MD (Dermatology)   Assessment:     Exercise Activities and Dietary recommendations Current Exercise Habits: Home exercise routine, Type of exercise: strength training/weights;walking, Time (Minutes): > 60, Frequency (Times/Week): 4, Weekly Exercise  (Minutes/Week): 0, Intensity: Mild  Goals    . Increase water intake          Recommend drinking at least 7-8 glasses of water a day      Fall Risk Fall Risk  06/03/2017 12/17/2016 11/18/2016 05/19/2016 05/09/2015  Falls in the past year? No No No No No   Depression Screen PHQ 2/9 Scores 06/03/2017 11/18/2016 05/19/2016 05/09/2015  PHQ - 2 Score 0 0 0 0    Cognitive Function     6CIT Screen 06/03/2017  What Year? 0 points  What month? 0 points  What time? 0 points  Count back from 20 0 points  Months in reverse 0 points  Repeat phrase 0 points  Total Score 0    Immunization History  Administered Date(s) Administered  . Influenza-Unspecified 08/09/2015  . Pneumococcal Conjugate-13 05/07/2014  . Pneumococcal Polysaccharide-23 08/02/2012  . Td 09/25/2009   Screening Tests Health Maintenance  Topic Date Due  . TETANUS/TDAP  09/26/2019  . COLONOSCOPY  06/11/2024  . INFLUENZA VACCINE  Completed  . Hepatitis C Screening  Completed  . PNA vac Low Risk Adult  Completed      Plan:    I have personally reviewed and addressed the Medicare Annual Wellness questionnaire and have noted the following in the patient's chart:  A. Medical and social history B. Use of alcohol, tobacco or illicit drugs  C. Current medications and supplements D. Functional ability and status E.  Nutritional status F.  Physical activity G. Advance directives H. List of other physicians I.  Hospitalizations, surgeries, and ER visits in previous 12 months J.  [place] such as hearing and vision if needed, cognitive and depression L. Referrals and appointments   In addition, I have reviewed and discussed with patient certain preventive protocols, quality metrics, and best practice recommendations. A written personalized care plan for preventive services as well as general preventive health recommendations were provided to patient.   Signed,  Tyler Aas, LPN Nurse Health Advisor   MD  Recommendations: none

## 2017-06-05 DIAGNOSIS — Z23 Encounter for immunization: Secondary | ICD-10-CM | POA: Diagnosis not present

## 2017-06-07 ENCOUNTER — Other Ambulatory Visit: Payer: Self-pay | Admitting: Unknown Physician Specialty

## 2017-06-09 ENCOUNTER — Other Ambulatory Visit: Payer: Medicare Other

## 2017-06-09 DIAGNOSIS — R7303 Prediabetes: Secondary | ICD-10-CM | POA: Diagnosis not present

## 2017-06-09 DIAGNOSIS — I10 Essential (primary) hypertension: Secondary | ICD-10-CM

## 2017-06-09 DIAGNOSIS — E785 Hyperlipidemia, unspecified: Secondary | ICD-10-CM

## 2017-06-09 DIAGNOSIS — Z1329 Encounter for screening for other suspected endocrine disorder: Secondary | ICD-10-CM | POA: Diagnosis not present

## 2017-06-09 DIAGNOSIS — Z125 Encounter for screening for malignant neoplasm of prostate: Secondary | ICD-10-CM

## 2017-06-09 LAB — URINALYSIS, ROUTINE W REFLEX MICROSCOPIC
BILIRUBIN UA: NEGATIVE
Glucose, UA: NEGATIVE
Ketones, UA: NEGATIVE
Leukocytes, UA: NEGATIVE
Nitrite, UA: NEGATIVE
PH UA: 5.5 (ref 5.0–7.5)
Protein, UA: NEGATIVE
RBC UA: NEGATIVE
Specific Gravity, UA: 1.01 (ref 1.005–1.030)
UUROB: 0.2 mg/dL (ref 0.2–1.0)

## 2017-06-09 LAB — MICROSCOPIC EXAMINATION: BACTERIA UA: NONE SEEN

## 2017-06-10 ENCOUNTER — Encounter: Payer: Self-pay | Admitting: Family Medicine

## 2017-06-10 LAB — CBC WITH DIFFERENTIAL/PLATELET
BASOS ABS: 0 10*3/uL (ref 0.0–0.2)
Basos: 0 %
EOS (ABSOLUTE): 0.2 10*3/uL (ref 0.0–0.4)
Eos: 4 %
HEMATOCRIT: 38.8 % (ref 37.5–51.0)
Hemoglobin: 12.8 g/dL — ABNORMAL LOW (ref 13.0–17.7)
Immature Grans (Abs): 0 10*3/uL (ref 0.0–0.1)
Immature Granulocytes: 0 %
LYMPHS ABS: 1.4 10*3/uL (ref 0.7–3.1)
Lymphs: 23 %
MCH: 32.4 pg (ref 26.6–33.0)
MCHC: 33 g/dL (ref 31.5–35.7)
MCV: 98 fL — AB (ref 79–97)
MONOS ABS: 0.6 10*3/uL (ref 0.1–0.9)
Monocytes: 10 %
Neutrophils Absolute: 3.9 10*3/uL (ref 1.4–7.0)
Neutrophils: 63 %
Platelets: 170 10*3/uL (ref 150–379)
RBC: 3.95 x10E6/uL — AB (ref 4.14–5.80)
RDW: 14.1 % (ref 12.3–15.4)
WBC: 6.1 10*3/uL (ref 3.4–10.8)

## 2017-06-10 LAB — COMPREHENSIVE METABOLIC PANEL
A/G RATIO: 1.8 (ref 1.2–2.2)
ALBUMIN: 4 g/dL (ref 3.6–4.8)
ALT: 19 IU/L (ref 0–44)
AST: 21 IU/L (ref 0–40)
Alkaline Phosphatase: 122 IU/L — ABNORMAL HIGH (ref 39–117)
BUN/Creatinine Ratio: 15 (ref 10–24)
BUN: 17 mg/dL (ref 8–27)
Bilirubin Total: 0.8 mg/dL (ref 0.0–1.2)
CALCIUM: 9.3 mg/dL (ref 8.6–10.2)
CHLORIDE: 101 mmol/L (ref 96–106)
CO2: 25 mmol/L (ref 20–29)
Creatinine, Ser: 1.14 mg/dL (ref 0.76–1.27)
GFR calc Af Amer: 75 mL/min/{1.73_m2} (ref >59)
GFR, EST NON AFRICAN AMERICAN: 65 mL/min/{1.73_m2} (ref >59)
GLOBULIN, TOTAL: 2.2 g/dL (ref 1.5–4.5)
Glucose: 101 mg/dL — ABNORMAL HIGH (ref 65–99)
Potassium: 3.7 mmol/L (ref 3.5–5.2)
SODIUM: 139 mmol/L (ref 134–144)
TOTAL PROTEIN: 6.2 g/dL (ref 6.0–8.5)

## 2017-06-10 LAB — LIPID PANEL
CHOL/HDL RATIO: 2.6 ratio (ref 0.0–5.0)
Cholesterol, Total: 106 mg/dL (ref 100–199)
HDL: 41 mg/dL (ref >39)
LDL Calculated: 42 mg/dL (ref 0–99)
TRIGLYCERIDES: 114 mg/dL (ref 0–149)
VLDL Cholesterol Cal: 23 mg/dL (ref 5–40)

## 2017-06-10 LAB — TSH: TSH: 2.76 u[IU]/mL (ref 0.450–4.500)

## 2017-06-15 ENCOUNTER — Encounter: Payer: Medicare Other | Admitting: Family Medicine

## 2017-06-15 DIAGNOSIS — I255 Ischemic cardiomyopathy: Secondary | ICD-10-CM | POA: Diagnosis not present

## 2017-06-15 DIAGNOSIS — I10 Essential (primary) hypertension: Secondary | ICD-10-CM | POA: Diagnosis not present

## 2017-06-15 DIAGNOSIS — I480 Paroxysmal atrial fibrillation: Secondary | ICD-10-CM | POA: Diagnosis not present

## 2017-06-15 DIAGNOSIS — I2510 Atherosclerotic heart disease of native coronary artery without angina pectoris: Secondary | ICD-10-CM | POA: Diagnosis not present

## 2017-06-15 DIAGNOSIS — E782 Mixed hyperlipidemia: Secondary | ICD-10-CM | POA: Diagnosis not present

## 2017-06-23 ENCOUNTER — Other Ambulatory Visit: Payer: Self-pay | Admitting: Family Medicine

## 2017-06-23 DIAGNOSIS — I25111 Atherosclerotic heart disease of native coronary artery with angina pectoris with documented spasm: Secondary | ICD-10-CM

## 2017-06-23 DIAGNOSIS — I10 Essential (primary) hypertension: Secondary | ICD-10-CM

## 2017-06-24 ENCOUNTER — Other Ambulatory Visit: Payer: Self-pay | Admitting: Family Medicine

## 2017-06-24 ENCOUNTER — Ambulatory Visit (INDEPENDENT_AMBULATORY_CARE_PROVIDER_SITE_OTHER): Payer: Medicare Other | Admitting: Family Medicine

## 2017-06-24 ENCOUNTER — Encounter: Payer: Self-pay | Admitting: Family Medicine

## 2017-06-24 ENCOUNTER — Ambulatory Visit: Payer: Self-pay | Admitting: *Deleted

## 2017-06-24 ENCOUNTER — Other Ambulatory Visit: Payer: Self-pay

## 2017-06-24 VITALS — BP 95/58 | HR 77 | Resp 16 | Ht 69.0 in | Wt 227.0 lb

## 2017-06-24 DIAGNOSIS — I10 Essential (primary) hypertension: Secondary | ICD-10-CM | POA: Diagnosis not present

## 2017-06-24 DIAGNOSIS — I6523 Occlusion and stenosis of bilateral carotid arteries: Secondary | ICD-10-CM

## 2017-06-24 DIAGNOSIS — S39012A Strain of muscle, fascia and tendon of lower back, initial encounter: Secondary | ICD-10-CM | POA: Diagnosis not present

## 2017-06-24 DIAGNOSIS — M109 Gout, unspecified: Secondary | ICD-10-CM

## 2017-06-24 DIAGNOSIS — I25118 Atherosclerotic heart disease of native coronary artery with other forms of angina pectoris: Secondary | ICD-10-CM | POA: Diagnosis not present

## 2017-06-24 DIAGNOSIS — I959 Hypotension, unspecified: Secondary | ICD-10-CM | POA: Diagnosis not present

## 2017-06-24 DIAGNOSIS — I209 Angina pectoris, unspecified: Secondary | ICD-10-CM

## 2017-06-24 MED ORDER — ALLOPURINOL 300 MG PO TABS: 150.0000 mg | ORAL_TABLET | Freq: Every day | ORAL | 0 refills | Status: DC

## 2017-06-24 MED ORDER — NAPROXEN 500 MG PO TABS: 500.0000 mg | ORAL_TABLET | Freq: Two times a day (BID) | ORAL | 0 refills | Status: DC

## 2017-06-24 MED ORDER — HYDRALAZINE HCL 25 MG PO TABS: 25.0000 mg | ORAL_TABLET | Freq: Two times a day (BID) | ORAL | 0 refills | Status: DC

## 2017-06-24 NOTE — Progress Notes (Signed)
Date:  06/24/2017   Name:  Darrell [name]   DOB:  09/17/1945   MRN:  [id number]  PCP:  Guadalupe Maple, MD    Chief Complaint: Back Pain (3 days ago denies injury )   History of Present Illness:  This is a 71 y.o. male seen urgently for right-sided LBP x 3d, no known injury, hurts to change position only, no radiation, not affecting sleep, Tylenol no help. Had coronary stent placement last month, on multiple new cardiac meds, no recent CP, taking hydralazine bid only. On allopurinol for gout, no recent flares,  Review of Systems:  Review of Systems  Constitutional: Negative for chills and fever.  Respiratory: Negative for cough and shortness of breath.   Cardiovascular: Negative for chest pain and leg swelling.  Genitourinary: Negative for difficulty urinating, dysuria and hematuria.  Neurological: Negative for syncope and light-headedness.    Patient Active Problem List   Diagnosis Date Noted  . Gout 12/17/2016  . Prediabetes 12/17/2016  . Carotid stenosis 06/12/2016  . Psoriasis 05/09/2015  . CAD (coronary artery disease) 05/09/2015  . Sleep apnea 05/09/2015  . Hypertension   . Hyperlipidemia     Prior to Admission medications   Medication Sig Start Date End Date Taking? Authorizing Provider  allopurinol (ZYLOPRIM) 300 MG tablet Take 0.5 tablets (150 mg total) daily by mouth. 06/24/17  Yes Serapio Edelson, Gwyndolyn Saxon, MD  aspirin EC 81 MG EC tablet Take 1 tablet (81 mg total) by mouth daily. 05/21/17  Yes Mody, Ulice Bold, MD  atorvastatin (LIPITOR) 40 MG tablet TAKE 1 TABLET(40 MG) BY MOUTH DAILY 06/23/17  Yes Crissman, Jeannette How, MD  clopidogrel (PLAVIX) 75 MG tablet Take 75 mg by mouth daily.   Yes [provider]  Guselkumab (TREMFYA) 100 MG/ML SOSY Inject 1 mL into the skin every 30 (thirty) days. Every 2 months   Yes [provider]  hydrALAZINE (APRESOLINE) 25 MG tablet TAKE 1 TABLET(25 MG) BY MOUTH THREE TIMES DAILY 02/19/17  Yes Kathrine Haddock, NP  losartan (COZAAR)  100 MG tablet Take by mouth. 07/31/14  Yes [provider]  metoprolol succinate (TOPROL-XL) 25 MG 24 hr tablet Take by mouth. 11/08/13  Yes [provider]  nitroGLYCERIN (NITROSTAT) 0.4 MG SL tablet Place under the tongue. 05/28/17 05/28/18 Yes [provider]  naproxen (NAPROSYN) 500 MG tablet Take 1 tablet (500 mg total) 2 (two) times daily with a meal by mouth. 06/24/17   Adline Potter, MD    No Known Allergies  Past Surgical History:  Procedure Laterality Date  . CHOLECYSTECTOMY    . CORONARY ANGIOPLASTY WITH STENT PLACEMENT    . LIVER BIOPSY  1999  . SHOULDER SURGERY    . stents      Social History   Tobacco Use  . Smoking status: Former Smoker    Types: Cigarettes  . Smokeless tobacco: Never Used  . Tobacco comment: quit 30 years ago   Substance Use Topics  . Alcohol use: Yes    Alcohol/week: 0.6 oz    Types: 1 Cans of beer per week    Comment: occasioanlly   . Drug use: No    Family History  Problem Relation Age of Onset  . Heart attack Mother   . Diabetes Mother   . Stroke Mother   . Heart disease Mother   . Diabetes Brother     Medication list has been reviewed and updated.  Physical Examination: BP (!) 95/58   Pulse 77   Resp  16   Ht 5\' 9"  (1.753 m)   Wt 227 lb (103 kg)   SpO2 95%   BMI 33.52 kg/m   Physical Exam  Constitutional: He appears well-developed and well-nourished.  Cardiovascular: Normal rate, regular rhythm and normal heart sounds.  Pulmonary/Chest: Effort normal and breath sounds normal.  Musculoskeletal: He exhibits no edema.  Negative bilateral SLR, bent knee flexion, and hip rotation  Neurological: He is alert.  Strength BLE symmetric  Skin: Skin is warm and dry.  Psychiatric: He has a normal mood and affect. His behavior is normal.  Nursing note and vitals reviewed.   Assessment and Plan:  1. Lumbar strain, initial encounter Naprosyn bid x 5d only, call if sxs worsen/persist  2. Hypotension,  unspecified hypotension type Likely due to new cardiac meds, d/c HCTZ, recheck at scheduled visit next month  3. Gout, unspecified cause, unspecified chronicity, unspecified site Allopurinol dose high for age, decrease to 150 mg daily now, 100 mg daily next refill  4. Coronary artery disease of native artery of native heart with stable angina pectoris (HCC) Cont DAPT/BB/ARB/hydralazine/statin, cards following  Return if symptoms worsen or fail to improve.  Satira Anis. Habiba Treloar, [facility]  06/24/2017

## 2017-06-24 NOTE — Patient Instructions (Signed)
Take naproxen twice daily for next 5 days only. Stop hydrochlorothiazide. Decrease allopurinol to 1/2 tablet daily.

## 2017-06-24 NOTE — Telephone Encounter (Signed)
Appt made for today with Dr. Vicente Masson at 1:00.   Instructed to be there at 12:45.   Pt verbalized understanding.  Reason for Disposition . [1] Age > 18 AND [2] no history of prior similar back pain  Answer Assessment - Initial Assessment Questions 1. ONSET: "When did the pain begin?"      3 days ago   Felt it when I got up.  2. LOCATION: "Where does it hurt?" (upper, mid or lower back)     Bottom part of back on right hand side below waist. 3. SEVERITY: "How bad is the pain?"  (e.g., Scale 1-10; mild, moderate, or severe)   - MILD (1-3): doesn't interfere with normal activities    - MODERATE (4-7): interferes with normal activities or awakens from sleep    - SEVERE (8-10): excruciating pain, unable to do any normal activities      7 if I move around but lying still there is no pain. 4. PATTERN: "Is the pain constant?" (e.g., yes, no; constant, intermittent)      Sitting down and getting up causes pain.   Walking does not bother me.  Bending over hurts 5. RADIATION: "Does the pain shoot into your legs or elsewhere?"     No radiation of pain 6. CAUSE:  "What do you think is causing the back pain?"      No idea 7. BACK OVERUSE:  "Any recent lifting of heavy objects, strenuous work or exercise?"     No 8. MEDICATIONS: "What have you taken so far for the pain?" (e.g., nothing, acetaminophen, NSAIDS)     Sleep fine without pain.   Been using Tylenol but it's not helping with the pain as far as movement. 9. NEUROLOGIC SYMPTOMS: "Do you have any weakness, numbness, or problems with bowel/bladder control?"     No bowel or bladder control issues.   Denies numbness or weakness 10. OTHER SYMPTOMS: "Do you have any other symptoms?" (e.g., fever, abdominal pain, burning with urination, blood in urine)       No fever, burning with urination 11. PREGNANCY: "Is there any chance you are pregnant?" (e.g., yes, no; LMP)       N/A  Protocols used: BACK PAIN-A-AH

## 2017-06-28 ENCOUNTER — Telehealth: Payer: Self-pay

## 2017-06-28 NOTE — Telephone Encounter (Signed)
Error

## 2017-07-01 ENCOUNTER — Other Ambulatory Visit: Payer: Self-pay | Admitting: Unknown Physician Specialty

## 2017-07-01 DIAGNOSIS — I10 Essential (primary) hypertension: Secondary | ICD-10-CM

## 2017-07-07 ENCOUNTER — Other Ambulatory Visit: Payer: Self-pay | Admitting: Family Medicine

## 2017-07-07 DIAGNOSIS — I10 Essential (primary) hypertension: Secondary | ICD-10-CM

## 2017-07-07 NOTE — Telephone Encounter (Signed)
Patient has annual appointment 12/10- Ok to refill?

## 2017-07-12 DIAGNOSIS — I2510 Atherosclerotic heart disease of native coronary artery without angina pectoris: Secondary | ICD-10-CM | POA: Diagnosis not present

## 2017-07-12 DIAGNOSIS — I480 Paroxysmal atrial fibrillation: Secondary | ICD-10-CM | POA: Diagnosis not present

## 2017-07-12 DIAGNOSIS — I10 Essential (primary) hypertension: Secondary | ICD-10-CM | POA: Diagnosis not present

## 2017-07-12 DIAGNOSIS — E782 Mixed hyperlipidemia: Secondary | ICD-10-CM | POA: Diagnosis not present

## 2017-07-22 ENCOUNTER — Inpatient Hospital Stay: Payer: Medicare Other | Admitting: Oncology

## 2017-07-22 ENCOUNTER — Inpatient Hospital Stay: Payer: Medicare Other

## 2017-07-23 ENCOUNTER — Encounter: Payer: Self-pay | Admitting: Oncology

## 2017-07-23 ENCOUNTER — Inpatient Hospital Stay: Payer: Medicare Other

## 2017-07-23 ENCOUNTER — Inpatient Hospital Stay: Payer: Medicare Other | Attending: Oncology | Admitting: Oncology

## 2017-07-23 DIAGNOSIS — Z79899 Other long term (current) drug therapy: Secondary | ICD-10-CM | POA: Insufficient documentation

## 2017-07-23 DIAGNOSIS — I10 Essential (primary) hypertension: Secondary | ICD-10-CM | POA: Diagnosis not present

## 2017-07-23 DIAGNOSIS — Z801 Family history of malignant neoplasm of trachea, bronchus and lung: Secondary | ICD-10-CM | POA: Insufficient documentation

## 2017-07-23 DIAGNOSIS — E785 Hyperlipidemia, unspecified: Secondary | ICD-10-CM | POA: Diagnosis not present

## 2017-07-23 DIAGNOSIS — I2510 Atherosclerotic heart disease of native coronary artery without angina pectoris: Secondary | ICD-10-CM

## 2017-07-23 DIAGNOSIS — L4050 Arthropathic psoriasis, unspecified: Secondary | ICD-10-CM

## 2017-07-23 DIAGNOSIS — Z87891 Personal history of nicotine dependence: Secondary | ICD-10-CM | POA: Diagnosis not present

## 2017-07-23 DIAGNOSIS — M2550 Pain in unspecified joint: Secondary | ICD-10-CM

## 2017-07-23 DIAGNOSIS — Z8546 Personal history of malignant neoplasm of prostate: Secondary | ICD-10-CM | POA: Insufficient documentation

## 2017-07-23 DIAGNOSIS — Z800 Family history of malignant neoplasm of digestive organs: Secondary | ICD-10-CM | POA: Insufficient documentation

## 2017-07-23 DIAGNOSIS — Z9049 Acquired absence of other specified parts of digestive tract: Secondary | ICD-10-CM | POA: Diagnosis not present

## 2017-07-23 DIAGNOSIS — Z7982 Long term (current) use of aspirin: Secondary | ICD-10-CM | POA: Insufficient documentation

## 2017-07-23 MED ORDER — TILDRAKIZUMAB-ASMN 100 MG/ML ~~LOC~~ SOSY
100.0000 mg | PREFILLED_SYRINGE | Freq: Once | SUBCUTANEOUS | Status: AC
  Administered 2017-07-23: 100 mg via SUBCUTANEOUS
  Filled 2017-07-23: qty 1

## 2017-07-23 NOTE — Progress Notes (Signed)
[place]  [phone] [phone]  ID: JOVAN COLLIGAN OB: Sep 23, 1945  MR#: [id number]  HYI#:[id number]  Patient Care Team: Guadalupe Maple, MD as PCP - General (Family Medicine) Lucky Cowboy, Erskine Squibb, MD as Referring Physician (Vascular Surgery) Royston Cowper, MD (Urology) Corey Skains, MD as Consulting Physician (Internal Medicine) Dasher, Rayvon Char, MD (Dermatology)  CHIEF COMPLAINT: Ilumya subcutaneous injection for psoriatic arthritis.  INTERVAL HISTORY: Patient is a 71 year old male with a long-standing history of psoriatic arthritis who is referred to clinic to receive subcutaneous injection Ilumya.  He currently feels well and is asymptomatic.  He denies any recent fevers or illnesses.  He had a recent TB test that was reported as negative.  He has no neurologic complaints.  He denies any chest pain or shortness of breath.  He has chronic joint pain.  He denies any nausea, vomiting, constipation, or diarrhea.  He has no urinary complaints.  Patient feels at his baseline offers no specific complaints today.  REVIEW OF SYSTEMS:   Review of Systems  Constitutional: Negative.  Negative for fever, malaise/fatigue and weight loss.  Respiratory: Negative.  Negative for cough, hemoptysis and shortness of breath.   Cardiovascular: Negative.  Negative for chest pain and leg swelling.  Gastrointestinal: Negative.  Negative for abdominal pain.  Genitourinary: Negative.   Musculoskeletal: Positive for joint pain.  Skin: Negative.  Negative for rash.  Neurological: Negative.  Negative for sensory change and weakness.  Psychiatric/Behavioral: The patient is not nervous/anxious.     As per HPI. Otherwise, a complete review of systems is negative.  PAST MEDICAL HISTORY: Past Medical History:  Diagnosis Date  . CAD (coronary artery disease)   . Cancer [facility])    prostate  . Carotid artery stenosis   . Hyperlipidemia   . Hypertension   . Psoriasis      PAST SURGICAL HISTORY: Past Surgical History:  Procedure Laterality Date  . CHOLECYSTECTOMY    . CORONARY ANGIOPLASTY WITH STENT PLACEMENT    . LEFT HEART CATH AND CORONARY ANGIOGRAPHY N/A 05/21/2017   Procedure: LEFT HEART CATH AND CORONARY ANGIOGRAPHY;  Surgeon: Teodoro Spray, MD;  Location: [facility];  Service: Cardiovascular;  Laterality: N/A;  . LIVER BIOPSY  1999  . SHOULDER SURGERY    . stents      FAMILY HISTORY: Family History  Problem Relation Age of Onset  . Heart attack Mother   . Diabetes Mother   . Stroke Mother   . Heart disease Mother   . Diabetes Brother   . Heart Problems Brother   . Diabetes Sister   . Lung cancer Maternal Aunt   . Esophageal cancer Sister     ADVANCED DIRECTIVES (Y/N):  N  HEALTH MAINTENANCE: Social History   Tobacco Use  . Smoking status: Former Smoker    Types: Cigarettes  . Smokeless tobacco: Never Used  . Tobacco comment: quit 30 years ago   Substance Use Topics  . Alcohol use: Yes    Alcohol/week: 0.6 oz    Types: 1 Cans of beer per week    Comment: occasioanlly   . Drug use: No     Colonoscopy:  PAP:  Bone density:  Lipid panel:  No Known Allergies  Current Outpatient Medications  Medication Sig Dispense Refill  . allopurinol (ZYLOPRIM) 300 MG tablet Take 0.5 tablets (150 mg total) daily by mouth. 90 tablet 0  . aspirin EC 81 MG EC tablet Take 1 tablet (81 mg  total) by mouth daily.    Marland Kitchen atorvastatin (LIPITOR) 80 MG tablet Take 80 mg by mouth daily.    . clopidogrel (PLAVIX) 75 MG tablet Take 75 mg by mouth daily.    . hydrALAZINE (APRESOLINE) 25 MG tablet TAKE 1 TABLET(25 MG) BY MOUTH THREE TIMES DAILY 270 tablet 0  . hydrochlorothiazide (HYDRODIURIL) 25 MG tablet Take 25 mg by mouth daily.    . metoprolol succinate (TOPROL-XL) 25 MG 24 hr tablet Take 25 mg by mouth daily.     . naproxen (NAPROSYN) 500 MG tablet Take 1 tablet (500 mg total) 2 (two) times daily with a meal by mouth. 10 tablet 0  .  nitroGLYCERIN (NITROSTAT) 0.4 MG SL tablet Place under the tongue.    Marland Kitchen telmisartan (MICARDIS) 40 MG tablet Take 40 mg by mouth daily.     No current facility-administered medications for this visit.     OBJECTIVE: Vitals:   07/23/17 1421  BP: (!) 144/72  Pulse: (!) 52  Resp: 18  Temp: (!) 97 F (36.1 C)     Body mass index is 33.71 kg/m.    ECOG FS:0 - Asymptomatic  General: Well-developed, well-nourished, no acute distress. Eyes: Pink conjunctiva, anicteric sclera. Musculoskeletal: No edema, cyanosis, or clubbing. Neuro: Alert, answering all questions appropriately. Cranial nerves grossly intact. Skin: No rashes or petechiae noted. Psych: Normal affect.   LAB RESULTS:  Lab Results  Component Value Date   NA 139 06/09/2017   K 3.7 06/09/2017   CL 101 06/09/2017   CO2 25 06/09/2017   GLUCOSE 101 (H) 06/09/2017   BUN 17 06/09/2017   CREATININE 1.14 06/09/2017   CALCIUM 9.3 06/09/2017   PROT 6.2 06/09/2017   ALBUMIN 4.0 06/09/2017   AST 21 06/09/2017   ALT 19 06/09/2017   ALKPHOS 122 (H) 06/09/2017   BILITOT 0.8 06/09/2017   GFRNONAA 65 06/09/2017   GFRAA 75 06/09/2017    Lab Results  Component Value Date   WBC 6.1 06/09/2017   NEUTROABS 3.9 06/09/2017   HGB 12.8 (L) 06/09/2017   HCT 38.8 06/09/2017   MCV 98 (H) 06/09/2017   PLT 170 06/09/2017     STUDIES: No results found.  ASSESSMENT: Ilumya subcutaneous injection for psoriatic arthritis.  PLAN:    1. Ilumya subcutaneous injection for psoriatic arthritis: Patient will receive a one-time subcutaneous injection today.  He is scheduled to receive injections at 4 weeks and then at 12 weeks.  He subsequently will receive maintenance injections every 12 weeks.  The remainder of his subcutaneous injections can be self-administered at home.  He expressed understanding that any questions about his treatment or his psoriatic arthritis should be referred to Dr. Evorn Gong.  No further follow-up has been  scheduled.  Approximately 45 minutes was spent in discussion of which greater than 50% was consultation.  Patient expressed understanding and was in agreement with this plan. He also understands that He can call clinic at any time with any questions, concerns, or complaints.    Lloyd Huger, MD   07/23/2017 3:33 PM

## 2017-07-26 ENCOUNTER — Encounter: Payer: Medicare Other | Admitting: Family Medicine

## 2017-08-04 ENCOUNTER — Encounter: Payer: Self-pay | Admitting: Dermatology

## 2017-08-06 ENCOUNTER — Encounter: Payer: Self-pay | Admitting: Dermatology

## 2017-08-20 ENCOUNTER — Inpatient Hospital Stay: Payer: Medicare Other | Attending: Oncology

## 2017-08-20 VITALS — BP 151/63 | HR 75 | Temp 97.3°F | Resp 20

## 2017-08-20 DIAGNOSIS — L4050 Arthropathic psoriasis, unspecified: Secondary | ICD-10-CM | POA: Insufficient documentation

## 2017-08-20 DIAGNOSIS — Z79899 Other long term (current) drug therapy: Secondary | ICD-10-CM | POA: Diagnosis not present

## 2017-08-20 MED ORDER — TILDRAKIZUMAB-ASMN 100 MG/ML ~~LOC~~ SOSY
100.0000 mg | PREFILLED_SYRINGE | Freq: Once | SUBCUTANEOUS | Status: AC
Start: 2017-08-20 — End: 2017-08-20
  Administered 2017-08-20: 100 mg via SUBCUTANEOUS
  Filled 2017-08-20 (×2): qty 1

## 2017-08-23 DIAGNOSIS — I25118 Atherosclerotic heart disease of native coronary artery with other forms of angina pectoris: Secondary | ICD-10-CM | POA: Diagnosis not present

## 2017-08-23 DIAGNOSIS — I10 Essential (primary) hypertension: Secondary | ICD-10-CM | POA: Diagnosis not present

## 2017-08-23 DIAGNOSIS — I480 Paroxysmal atrial fibrillation: Secondary | ICD-10-CM | POA: Diagnosis not present

## 2017-08-23 DIAGNOSIS — E782 Mixed hyperlipidemia: Secondary | ICD-10-CM | POA: Diagnosis not present

## 2017-08-26 ENCOUNTER — Encounter: Payer: Self-pay | Admitting: *Deleted

## 2017-08-26 ENCOUNTER — Encounter: Payer: Medicare Other | Attending: Internal Medicine | Admitting: *Deleted

## 2017-08-26 VITALS — Ht 67.5 in | Wt 223.9 lb

## 2017-08-26 DIAGNOSIS — I2510 Atherosclerotic heart disease of native coronary artery without angina pectoris: Secondary | ICD-10-CM | POA: Insufficient documentation

## 2017-08-26 DIAGNOSIS — Z955 Presence of coronary angioplasty implant and graft: Secondary | ICD-10-CM | POA: Diagnosis not present

## 2017-08-26 NOTE — Patient Instructions (Signed)
Patient Instructions  Patient Details  Name: Darrell [name] MRN: [id number] Date of Birth: 09-08-1945 Referring Provider:  Corey Skains, MD  Below are the personal goals you chose as well as exercise and nutrition goals. Our goal is to help you keep on track towards obtaining and maintaining your goals. We will be discussing your progress on these goals with you throughout the program.  Initial Exercise Prescription: Initial Exercise Prescription - 08/26/17 1400      Date of Initial Exercise RX and Referring Provider   Date  08/26/17    Referring Provider  [name]      Treadmill   MPH  2.8    Grade  0.5    Minutes  15    METs  3.34      Recumbant Bike   Level  3    RPM  60    Watts  35    Minutes  15    METs  3      Elliptical   Level  1    Speed  3    Minutes  15      T5 Nustep   Level  2    SPM  80    Minutes  15    METs  3      Prescription Details   Frequency (times per week)  3    Duration  Progress to 30 minutes of continuous aerobic without signs/symptoms of physical distress      Intensity   THRR 40-80% of Max Heartrate  112-137    Ratings of Perceived Exertion  11-13    Perceived Dyspnea  0-4      Resistance Training   Training Prescription  Yes    Weight  5 lb    Reps  10-15       Exercise Goals: Frequency: Be able to perform aerobic exercise three times per week working toward 3-5 days per week.  Intensity: Work with a perceived exertion of 11 (fairly light) - 15 (hard) as tolerated. Follow your new exercise prescription and watch for changes in prescription as you progress with the program. Changes will be reviewed with you when they are made.  Duration: You should be able to do 30 minutes of continuous aerobic exercise in addition to a 5 minute warm-up and a 5 minute cool-down routine.  Nutrition Goals: Your personal nutrition goals will be established when you do your nutrition analysis with the dietician.  The following are  nutrition guidelines to follow: Cholesterol < 200mg /day Sodium < 1500mg /day Fiber: Men over 50 yrs - 30 grams per day  Personal Goals:   Tobacco Use Initial Evaluation: Social History   Tobacco Use  Smoking Status Former Smoker  . Types: Cigarettes  Smokeless Tobacco Never Used  Tobacco Comment   quit 30 years ago     Exercise Goals and Review: Exercise Goals    Row Name 08/26/17 1426             Exercise Goals   Increase Physical Activity  Yes       Intervention  Provide advice, education, support and counseling about physical activity/exercise needs.;Develop an individualized exercise prescription for aerobic and resistive training based on initial evaluation findings, risk stratification, comorbidities and participant's personal goals.       Expected Outcomes  Achievement of increased cardiorespiratory fitness and enhanced flexibility, muscular endurance and strength shown through measurements of functional capacity and personal statement of participant.  Increase Strength and Stamina  Yes       Intervention  Provide advice, education, support and counseling about physical activity/exercise needs.;Develop an individualized exercise prescription for aerobic and resistive training based on initial evaluation findings, risk stratification, comorbidities and participant's personal goals.       Expected Outcomes  Achievement of increased cardiorespiratory fitness and enhanced flexibility, muscular endurance and strength shown through measurements of functional capacity and personal statement of participant.       Able to understand and use rate of perceived exertion (RPE) scale  Yes       Intervention  Provide education and explanation on how to use RPE scale       Expected Outcomes  Short Term: Able to use RPE daily in rehab to express subjective intensity level;Long Term:  Able to use RPE to guide intensity level when exercising independently       Able to understand and use  Dyspnea scale  Yes       Intervention  Provide education and explanation on how to use Dyspnea scale       Expected Outcomes  Short Term: Able to use Dyspnea scale daily in rehab to express subjective sense of shortness of breath during exertion;Long Term: Able to use Dyspnea scale to guide intensity level when exercising independently       Knowledge and understanding of Target Heart Rate Range (THRR)  Yes       Intervention  Provide education and explanation of THRR including how the numbers were predicted and where they are located for reference       Expected Outcomes  Short Term: Able to state/look up THRR;Long Term: Able to use THRR to govern intensity when exercising independently;Short Term: Able to use daily as guideline for intensity in rehab       Able to check pulse independently  Yes       Intervention  Provide education and demonstration on how to check pulse in carotid and radial arteries.;Review the importance of being able to check your own pulse for safety during independent exercise       Expected Outcomes  Short Term: Able to explain why pulse checking is important during independent exercise;Long Term: Able to check pulse independently and accurately       Understanding of Exercise Prescription  Yes       Intervention  Provide education, explanation, and written materials on patient's individual exercise prescription       Expected Outcomes  Short Term: Able to explain program exercise prescription;Long Term: Able to explain home exercise prescription to exercise independently          Copy of goals given to participant.

## 2017-08-26 NOTE — Progress Notes (Signed)
Cardiac Individual Treatment Plan  Patient Details  Name: Darrell [name] MRN: [id number] Date of Birth: [birth year] Referring Provider:     Cardiac Rehab from 08/26/2017 in [facility] Cardiac and Pulmonary Rehab  Referring Provider  [name]      Initial Encounter Date:    Cardiac Rehab from 08/26/2017 in [facility] Cardiac and Pulmonary Rehab  Date  08/26/17  Referring Provider  [name]      Visit Diagnosis: Status post coronary artery stent placement  Patient's Home Medications on Admission:  Current Outpatient Medications:  .  allopurinol (ZYLOPRIM) 300 MG tablet, Take 0.5 tablets (150 mg total) daily by mouth., Disp: 90 tablet, Rfl: 0 .  aspirin EC 81 MG EC tablet, Take 1 tablet (81 mg total) by mouth daily., Disp: , Rfl:  .  atorvastatin (LIPITOR) 80 MG tablet, Take 80 mg by mouth daily., Disp: , Rfl:  .  clopidogrel (PLAVIX) 75 MG tablet, Take 75 mg by mouth daily., Disp: , Rfl:  .  hydrALAZINE (APRESOLINE) 25 MG tablet, TAKE 1 TABLET(25 MG) BY MOUTH THREE TIMES DAILY, Disp: 270 tablet, Rfl: 0 .  hydrochlorothiazide (HYDRODIURIL) 25 MG tablet, Take 25 mg by mouth daily., Disp: , Rfl:  .  metoprolol succinate (TOPROL-XL) 25 MG 24 hr tablet, Take 25 mg by mouth daily. , Disp: , Rfl:  .  naproxen (NAPROSYN) 500 MG tablet, Take 1 tablet (500 mg total) 2 (two) times daily with a meal by mouth., Disp: 10 tablet, Rfl: 0 .  nitroGLYCERIN (NITROSTAT) 0.4 MG SL tablet, Place under the tongue., Disp: , Rfl:  .  telmisartan (MICARDIS) 40 MG tablet, Take 40 mg by mouth daily., Disp: , Rfl:   Past Medical History: Past Medical History:  Diagnosis Date  . CAD (coronary artery disease)   . Cancer [facility])    prostate  . Carotid artery stenosis   . Hyperlipidemia   . Hypertension   . Psoriasis     Tobacco Use: Social History   Tobacco Use  Smoking Status Former Smoker  . Types: Cigarettes  Smokeless Tobacco Never Used  Tobacco Comment   quit 30 years ago     Labs: Recent Review  Flowsheet Data    Labs for ITP Cardiac and Pulmonary Rehab Latest Ref Rng & Units 05/19/2016 11/18/2016 12/17/2016 05/20/2017 06/09/2017   Cholestrol 100 - 199 mg/dL 118 112 104 - 106   LDLCALC 0 - 99 mg/dL 44 - 39 - 42   HDL >39 mg/dL 40 - 41 - 41   Trlycerides 0 - 149 mg/dL 171(H) 264(H) 122 - 114   Hemoglobin A1c 4.8 - 5.6 % - - - 5.6 -       Exercise Target Goals: Date: 08/26/17  Exercise Program Goal: Individual exercise prescription set with THRR, safety & activity barriers. Participant demonstrates ability to understand and report RPE using BORG scale, to self-measure pulse accurately, and to acknowledge the importance of the exercise prescription.  Exercise Prescription Goal: Starting with aerobic activity 30 plus minutes a day, 3 days per week for initial exercise prescription. Provide home exercise prescription and guidelines that participant acknowledges understanding prior to discharge.  Activity Barriers & Risk Stratification:   6 Minute Walk: 6 Minute Walk    Row Name 08/26/17 1427         6 Minute Walk   Distance  1600 feet     Walk Time  6 minutes     # of Rest Breaks  0     MPH  3.03     METS  2.96     RPE  8     Perceived Dyspnea   0     VO2 Peak  10.37     Symptoms  No     Resting HR  88 bpm     Resting BP  110/64     Resting Oxygen Saturation   95 %     Max Ex. HR  103 bpm     Max Ex. BP  116/68     2 Minute Post BP  110/62        Oxygen Initial Assessment:   Oxygen Re-Evaluation:   Oxygen Discharge (Final Oxygen Re-Evaluation):   Initial Exercise Prescription: Initial Exercise Prescription - 08/26/17 1400      Date of Initial Exercise RX and Referring Provider   Date  08/26/17    Referring Provider  [name]      Treadmill   MPH  2.8    Grade  0.5    Minutes  15    METs  3.34      Recumbant Bike   Level  3    RPM  60    Watts  35    Minutes  15    METs  3      Elliptical   Level  1    Speed  3    Minutes  15      T5  Nustep   Level  2    SPM  80    Minutes  15    METs  3      Prescription Details   Frequency (times per week)  3    Duration  Progress to 30 minutes of continuous aerobic without signs/symptoms of physical distress      Intensity   THRR 40-80% of Max Heartrate  112-137    Ratings of Perceived Exertion  11-13    Perceived Dyspnea  0-4      Resistance Training   Training Prescription  Yes    Weight  5 lb    Reps  10-15       Perform Capillary Blood Glucose checks as needed.  Exercise Prescription Changes: Exercise Prescription Changes    Row Name 08/26/17 1400             Response to Exercise   Blood Pressure (Admit)  110/64       Blood Pressure (Exercise)  116/68       Blood Pressure (Exit)  110/62       Heart Rate (Admit)  88 bpm       Heart Rate (Exercise)  103 bpm       Heart Rate (Exit)  84 bpm       Oxygen Saturation (Admit)  95 %       Rating of Perceived Exertion (Exercise)  8          Exercise Comments:   Exercise Goals and Review: Exercise Goals    Row Name 08/26/17 1426             Exercise Goals   Increase Physical Activity  Yes       Intervention  Provide advice, education, support and counseling about physical activity/exercise needs.;Develop an individualized exercise prescription for aerobic and resistive training based on initial evaluation findings, risk stratification, comorbidities and participant's personal goals.       Expected Outcomes  Achievement of increased cardiorespiratory fitness and enhanced flexibility, muscular endurance and strength  shown through measurements of functional capacity and personal statement of participant.       Increase Strength and Stamina  Yes       Intervention  Provide advice, education, support and counseling about physical activity/exercise needs.;Develop an individualized exercise prescription for aerobic and resistive training based on initial evaluation findings, risk stratification, comorbidities and  participant's personal goals.       Expected Outcomes  Achievement of increased cardiorespiratory fitness and enhanced flexibility, muscular endurance and strength shown through measurements of functional capacity and personal statement of participant.       Able to understand and use rate of perceived exertion (RPE) scale  Yes       Intervention  Provide education and explanation on how to use RPE scale       Expected Outcomes  Short Term: Able to use RPE daily in rehab to express subjective intensity level;Long Term:  Able to use RPE to guide intensity level when exercising independently       Able to understand and use Dyspnea scale  Yes       Intervention  Provide education and explanation on how to use Dyspnea scale       Expected Outcomes  Short Term: Able to use Dyspnea scale daily in rehab to express subjective sense of shortness of breath during exertion;Long Term: Able to use Dyspnea scale to guide intensity level when exercising independently       Knowledge and understanding of Target Heart Rate Range (THRR)  Yes       Intervention  Provide education and explanation of THRR including how the numbers were predicted and where they are located for reference       Expected Outcomes  Short Term: Able to state/look up THRR;Long Term: Able to use THRR to govern intensity when exercising independently;Short Term: Able to use daily as guideline for intensity in rehab       Able to check pulse independently  Yes       Intervention  Provide education and demonstration on how to check pulse in carotid and radial arteries.;Review the importance of being able to check your own pulse for safety during independent exercise       Expected Outcomes  Short Term: Able to explain why pulse checking is important during independent exercise;Long Term: Able to check pulse independently and accurately       Understanding of Exercise Prescription  Yes       Intervention  Provide education, explanation, and written  materials on patient's individual exercise prescription       Expected Outcomes  Short Term: Able to explain program exercise prescription;Long Term: Able to explain home exercise prescription to exercise independently          Exercise Goals Re-Evaluation :   Discharge Exercise Prescription (Final Exercise Prescription Changes): Exercise Prescription Changes - 08/26/17 1400      Response to Exercise   Blood Pressure (Admit)  110/64    Blood Pressure (Exercise)  116/68    Blood Pressure (Exit)  110/62    Heart Rate (Admit)  88 bpm    Heart Rate (Exercise)  103 bpm    Heart Rate (Exit)  84 bpm    Oxygen Saturation (Admit)  95 %    Rating of Perceived Exertion (Exercise)  8       Nutrition:  Target Goals: Understanding of nutrition guidelines, daily intake of sodium 1500mg , cholesterol 200mg , calories 30% from fat and 7% or less from saturated  fats, daily to have 5 or more servings of fruits and vegetables.  Biometrics: Pre Biometrics - 08/26/17 1425      Pre Biometrics   Height  5' 7.5" (1.715 m)    Weight  223 lb 14.4 oz (101.6 kg)    Waist Circumference  44 inches    Hip Circumference  45 inches    Waist to Hip Ratio  0.98 %    BMI (Calculated)  34.53    Single Leg Stand  30 seconds        Nutrition Therapy Plan and Nutrition Goals: Nutrition Therapy & Goals - 08/26/17 1249      Intervention Plan   Intervention  Prescribe, educate and counsel regarding individualized specific dietary modifications aiming towards targeted core components such as weight, hypertension, lipid management, diabetes, heart failure and other comorbidities.    Expected Outcomes  Short Term Goal: Understand basic principles of dietary content, such as calories, fat, sodium, cholesterol and nutrients.;Short Term Goal: A plan has been developed with personal nutrition goals set during dietitian appointment.;Long Term Goal: Adherence to prescribed nutrition plan.       Nutrition Discharge:  Rate Your Plate Scores: Nutrition Assessments - 08/26/17 1248      MEDFICTS Scores   Pre Score  40       Nutrition Goals Re-Evaluation:   Nutrition Goals Discharge (Final Nutrition Goals Re-Evaluation):   Psychosocial: Target Goals: Acknowledge presence or absence of significant depression and/or stress, maximize coping skills, provide positive support system. Participant is able to verbalize types and ability to use techniques and skills needed for reducing stress and depression.   Initial Review & Psychosocial Screening: Initial Psych Review & Screening - 08/26/17 1242      Initial Review   Current issues with  Current Anxiety/Panic;Current Sleep Concerns;Current Stress Concerns    Source of Stress Concerns  Occupation;Unable to perform yard/household activities    Comments  Due to age and chronic illness unable to do all he wants to do for activities      [place]?  Yes spouse      Barriers   Psychosocial barriers to participate in program  There are no identifiable barriers or psychosocial needs.;The patient should benefit from training in stress management and relaxation.      Screening Interventions   Interventions  Encouraged to exercise;Provide feedback about the scores to participant;To provide support and resources with identified psychosocial needs;Yes    Expected Outcomes  Short Term goal: Utilizing psychosocial counselor, staff and physician to assist with identification of specific Stressors or current issues interfering with healing process. Setting desired goal for each stressor or current issue identified.;Long Term Goal: Stressors or current issues are controlled or eliminated.;Short Term goal: Identification and review with participant of any Quality of Life or Depression concerns found by scoring the questionnaire.;Long Term goal: The participant improves quality of Life and PHQ9 Scores as seen by post scores and/or verbalization of  changes       Quality of Life Scores:  Quality of Life - 08/26/17 1247      Quality of Life Scores   Health/Function Pre  16.27 %    Socioeconomic Pre  20.38 %    Psych/Spiritual Pre  17.64 %    Family Pre  24 %    GLOBAL Pre  18.59 %       PHQ-9: Recent Review Flowsheet Data    Depression screen [facility] 2/9 08/26/2017 06/03/2017 11/18/2016 05/19/2016 05/09/2015  Decreased Interest 1 0 0 0 0   Down, Depressed, Hopeless 1 0 0 0 0   PHQ - 2 Score 2 0 0 0 0   Altered sleeping 2 - - - -   Tired, decreased energy 1 - - - -   Change in appetite 0 - - - -   Feeling bad or failure about yourself  0 - - - -   Trouble concentrating 0 - - - -   Moving slowly or fidgety/restless 0 - - - -   Suicidal thoughts 0 - - - -   PHQ-9 Score 5 - - - -   Difficult doing work/chores Not difficult at all - - - -     Interpretation of Total Score  Total Score Depression Severity:  1-4 = Minimal depression, 5-9 = Mild depression, 10-14 = Moderate depression, 15-19 = Moderately severe depression, 20-27 = Severe depression   Psychosocial Evaluation and Intervention:   Psychosocial Re-Evaluation:   Psychosocial Discharge (Final Psychosocial Re-Evaluation):   Vocational Rehabilitation: Provide vocational rehab assistance to qualifying candidates.   Vocational Rehab Evaluation & Intervention: Vocational Rehab - 08/26/17 1244      Initial Vocational Rehab Evaluation & Intervention   Assessment shows need for Vocational Rehabilitation  No       Education: Education Goals: Education classes will be provided on a variety of topics geared toward better understanding of heart health and risk factor modification. Participant will state understanding/return demonstration of topics presented as noted by education test scores.  Learning Barriers/Preferences: Learning Barriers/Preferences - 08/26/17 1244      Learning Barriers/Preferences   Learning Barriers  None    Learning Preferences  Individual  Instruction       Education Topics: General Nutrition Guidelines/Fats and Fiber: -Group instruction provided by verbal, written material, models and posters to present the general guidelines for heart healthy nutrition. Gives an explanation and review of dietary fats and fiber.   Controlling Sodium/Reading Food Labels: -Group verbal and written material supporting the discussion of sodium use in heart healthy nutrition. Review and explanation with models, verbal and written materials for utilization of the food label.   Exercise Physiology & Risk Factors: - Group verbal and written instruction with models to review the exercise physiology of the cardiovascular system and associated critical values. Details cardiovascular disease risk factors and the goals associated with each risk factor.   Aerobic Exercise & Resistance Training: - Gives group verbal and written discussion on the health impact of inactivity. On the components of aerobic and resistive training programs and the benefits of this training and how to safely progress through these programs.   Flexibility, Balance, General Exercise Guidelines: - Provides group verbal and written instruction on the benefits of flexibility and balance training programs. Provides general exercise guidelines with specific guidelines to those with heart or lung disease. Demonstration and skill practice provided.   Stress Management: - Provides group verbal and written instruction about the health risks of elevated stress, cause of high stress, and healthy ways to reduce stress.   Depression: - Provides group verbal and written instruction on the correlation between heart/lung disease and depressed mood, treatment options, and the stigmas associated with seeking treatment.   Anatomy & Physiology of the Heart: - Group verbal and written instruction and models provide basic cardiac anatomy and physiology, with the coronary electrical and arterial  systems. Review of: AMI, Angina, Valve disease, Heart Failure, Cardiac Arrhythmia, Pacemakers, and the ICD.   Cardiac Procedures: -  Group verbal and written instruction to review commonly prescribed medications for heart disease. Reviews the medication, class of the drug, and side effects. Includes the steps to properly store meds and maintain the prescription regimen. (beta blockers and nitrates)   Cardiac Medications I: - Group verbal and written instruction to review commonly prescribed medications for heart disease. Reviews the medication, class of the drug, and side effects. Includes the steps to properly store meds and maintain the prescription regimen.   Cardiac Medications II: -Group verbal and written instruction to review commonly prescribed medications for heart disease. Reviews the medication, class of the drug, and side effects. (all other drug classes)    Go Sex-Intimacy & Heart Disease, Get SMART - Goal Setting: - Group verbal and written instruction through game format to discuss heart disease and the return to sexual intimacy. Provides group verbal and written material to discuss and apply goal setting through the application of the S.M.A.R.T. Method.   Other Matters of the Heart: - Provides group verbal, written materials and models to describe Heart Failure, Angina, Valve Disease, Peripheral Artery Disease, and Diabetes in the realm of heart disease. Includes description of the disease process and treatment options available to the cardiac patient.   Exercise & Equipment Safety: - Individual verbal instruction and demonstration of equipment use and safety with use of the equipment.   Cardiac Rehab from 08/26/2017 in [facility] Cardiac and Pulmonary Rehab  Date  08/26/17  Educator  Sb  Instruction Review Code  1- Verbalizes Understanding      Infection Prevention: - Provides verbal and written material to individual with discussion of infection control including proper  hand washing and proper equipment cleaning during exercise session.   Cardiac Rehab from 08/26/2017 in [facility] Cardiac and Pulmonary Rehab  Date  08/26/17  Educator  Sb  Instruction Review Code  1- Verbalizes Understanding      Falls Prevention: - Provides verbal and written material to individual with discussion of falls prevention and safety.   Cardiac Rehab from 08/26/2017 in [facility] Cardiac and Pulmonary Rehab  Date  08/26/17  Educator  Sb  Instruction Review Code  1- Verbalizes Understanding      Diabetes: - Individual verbal and written instruction to review signs/symptoms of diabetes, desired ranges of glucose level fasting, after meals and with exercise. Acknowledge that pre and post exercise glucose checks will be done for 3 sessions at entry of program.   Other: -Provides group and verbal instruction on various topics (see comments)    Knowledge Questionnaire Score: Knowledge Questionnaire Score - 08/26/17 1244      Knowledge Questionnaire Score   Pre Score  24/28       Core Components/Risk Factors/Patient Goals at Admission:   Core Components/Risk Factors/Patient Goals Review:    Core Components/Risk Factors/Patient Goals at Discharge (Final Review):    ITP Comments: ITP Comments    Row Name 08/26/17 1250           ITP Comments  Medical review comlpeted  ITP sent to Dr Elta Guadeloupe Sabra Heck for review, changes as needed and signature.  Diagnosis documentatincan be found in [facility] Visit 05/27/2017          Comments: Initial ITP

## 2017-08-26 NOTE — Progress Notes (Signed)
Daily Session Note  Patient Details  Name: Darrell [name] MRN: [id number] Date of Birth: 11-18-1945 Referring Provider:     Cardiac Rehab from 08/26/2017 in [facility] Cardiac and Pulmonary Rehab  Referring Provider  [name]      Encounter Date: 08/26/2017  Check In: Session Check In - 08/26/17 1250      Check-In   Location  [facility]-Cardiac & Pulmonary Rehab    Staff Present  Renita Papa, RN Vickki Hearing, BA, ACSM CEP, Exercise Physiologist;Susanne Bice, RN, BSN, CCRP    Supervising physician immediately available to respond to emergencies  See telemetry face sheet for immediately available ER MD    Warm-up and Cool-down  Performed as group-led instruction    Resistance Training Performed  Yes        Exercise Prescription Changes - 08/26/17 1400      Response to Exercise   Blood Pressure (Admit)  110/64    Blood Pressure (Exercise)  116/68    Blood Pressure (Exit)  110/62    Heart Rate (Admit)  88 bpm    Heart Rate (Exercise)  103 bpm    Heart Rate (Exit)  84 bpm    Oxygen Saturation (Admit)  95 %    Rating of Perceived Exertion (Exercise)  8       Social History   Tobacco Use  Smoking Status Former Smoker  . Types: Cigarettes  Smokeless Tobacco Never Used  Tobacco Comment   quit 30 years ago     Goals Met:  Proper associated with RPD/PD & O2 Sat Exercise tolerated well Personal goals reviewed No report of cardiac concerns or symptoms Strength training completed today  Goals Unmet:  Not Applicable  Comments: Med review completed   Dr. Emily Filbert is Medical Director for [place] and [facility].

## 2017-08-30 DIAGNOSIS — I2510 Atherosclerotic heart disease of native coronary artery without angina pectoris: Secondary | ICD-10-CM | POA: Diagnosis not present

## 2017-08-30 DIAGNOSIS — E782 Mixed hyperlipidemia: Secondary | ICD-10-CM | POA: Diagnosis not present

## 2017-08-30 DIAGNOSIS — I25118 Atherosclerotic heart disease of native coronary artery with other forms of angina pectoris: Secondary | ICD-10-CM | POA: Diagnosis not present

## 2017-08-30 DIAGNOSIS — I10 Essential (primary) hypertension: Secondary | ICD-10-CM | POA: Diagnosis not present

## 2017-08-30 DIAGNOSIS — G4733 Obstructive sleep apnea (adult) (pediatric): Secondary | ICD-10-CM | POA: Diagnosis not present

## 2017-08-31 DIAGNOSIS — Z955 Presence of coronary angioplasty implant and graft: Secondary | ICD-10-CM | POA: Diagnosis not present

## 2017-08-31 NOTE — Progress Notes (Signed)
Daily Session Note  Patient Details  Name: Darrell [name] MRN: [id number] Date of Birth: Jun 09, 1946 Referring Provider:     Cardiac Rehab from 08/26/2017 in [facility] Cardiac and Pulmonary Rehab  Referring Provider  [name]      Encounter Date: 08/31/2017  Check In: Session Check In - 08/31/17 0845      Check-In   Location  [facility]-Cardiac & Pulmonary Rehab    Staff Present  Alberteen Sam, MA, RCEP, CCRP, Exercise Physiologist;Amanda Oletta Darter, BA, ACSM CEP, Exercise Physiologist;Susanne Bice, RN, BSN, CCRP    Supervising physician immediately available to respond to emergencies  See telemetry face sheet for immediately available ER MD    Medication changes reported      No    Fall or balance concerns reported     No    Warm-up and Cool-down  Performed on first and last piece of equipment    Resistance Training Performed  Yes    VAD Patient?  No      Pain Assessment   Currently in Pain?  No/denies    Multiple Pain Sites  No          Social History   Tobacco Use  Smoking Status Former Smoker  . Types: Cigarettes  Smokeless Tobacco Never Used  Tobacco Comment   quit 30 years ago     Goals Met:  Independence with exercise equipment Exercise tolerated well No report of cardiac concerns or symptoms Strength training completed today  Goals Unmet:  Not Applicable  Comments: First full day of exercise!  Patient was oriented to gym and equipment including functions, settings, policies, and procedures.  Patient's individual exercise prescription and treatment plan were reviewed.  All starting workloads were established based on the results of the 6 minute walk test done at initial orientation visit.  The plan for exercise progression was also introduced and progression will be customized based on patient's performance and goals.    Dr. Emily Filbert is Medical Director for [place] and [facility].

## 2017-09-06 ENCOUNTER — Other Ambulatory Visit: Payer: Self-pay | Admitting: Family Medicine

## 2017-09-07 ENCOUNTER — Encounter: Payer: Medicare Other | Admitting: *Deleted

## 2017-09-07 DIAGNOSIS — Z955 Presence of coronary angioplasty implant and graft: Secondary | ICD-10-CM | POA: Diagnosis not present

## 2017-09-07 NOTE — Progress Notes (Signed)
Daily Session Note  Patient Details  Name: Darrell [name] MRN: [id number] Date of Birth: 03/15/1946 Referring Provider:     Cardiac Rehab from 08/26/2017 in [facility]  Referring Provider  Kowalski      Encounter Date: 09/07/2017  Check In: Session Check In - 09/07/17 0911      Check-In   Location  [facility]    Staff Present   , MA, RCEP, CCRP, Exercise Physiologist;Amanda Sommer, BA, ACSM CEP, Exercise Physiologist;Susanne Bice, RN, BSN, CCRP    Supervising physician immediately available to respond to emergencies  See telemetry face sheet for immediately available ER MD    Medication changes reported      No    Fall or balance concerns reported     No    Warm-up and Cool-down  Performed on first and last piece of equipment    Resistance Training Performed  Yes    VAD Patient?  No      Pain Assessment   Currently in Pain?  No/denies        Exercise Prescription Changes - 09/06/17 1700      Response to Exercise   Blood Pressure (Admit)  128/80    Blood Pressure (Exercise)  138/64    Blood Pressure (Exit)  114/68    Heart Rate (Admit)  56 bpm    Heart Rate (Exercise)  130 bpm    Heart Rate (Exit)  82 bpm    Rating of Perceived Exertion (Exercise)  12    Symptoms  none    Comments  first full day of exercise    Duration  Continue with 30 min of aerobic exercise without signs/symptoms of physical distress.    Intensity  THRR unchanged      Progression   Progression  Continue to progress workloads to maintain intensity without signs/symptoms of physical distress.    Average METs  3.34      Resistance Training   Training Prescription  Yes    Weight  5 lb    Reps  10-15      Interval Training   Interval Training  No      Treadmill   MPH  2.8    Grade  0.5    Minutes  15    METs  3.34      Elliptical   Level  1    Speed  3    Minutes  15       Social History   Tobacco Use  Smoking Status  Former Smoker  . Types: Cigarettes  Smokeless Tobacco Never Used  Tobacco Comment   quit 30 years ago     Goals Met:  Independence with exercise equipment Exercise tolerated well No report of cardiac concerns or symptoms Strength training completed today  Goals Unmet:  Not Applicable  Comments: Pt able to follow exercise prescription today without complaint.  Will continue to monitor for progression.    Dr. Mark Miller is Medical Director for [facility] and [facility]. 

## 2017-09-08 ENCOUNTER — Encounter: Payer: Self-pay | Admitting: Dietician

## 2017-09-09 ENCOUNTER — Encounter: Payer: Medicare Other | Admitting: *Deleted

## 2017-09-09 DIAGNOSIS — Z955 Presence of coronary angioplasty implant and graft: Secondary | ICD-10-CM

## 2017-09-09 NOTE — Progress Notes (Signed)
Daily Session Note  Patient Details  Name: BARON PARMELEE MRN: [id number] Date of Birth: 02-15-1946 Referring Provider:     Cardiac Rehab from 08/26/2017 in [facility]  Referring Provider  [name]      Encounter Date: 09/09/2017  Check In: Session Check In - 09/09/17 1046      Check-In   Location  [facility]    Staff Present  Alberteen Sam, MA, RCEP, CCRP, Exercise Physiologist;Amanda Oletta Darter, BA, ACSM CEP, Exercise Physiologist;Carroll Enterkin, RN, BSN    Supervising physician immediately available to respond to emergencies  See telemetry face sheet for immediately available ER MD    Medication changes reported      No    Fall or balance concerns reported     No    Warm-up and Cool-down  Performed on first and last piece of equipment    Resistance Training Performed  Yes    VAD Patient?  No      Pain Assessment   Currently in Pain?  No/denies          Social History   Tobacco Use  Smoking Status Former Smoker  . Types: Cigarettes  Smokeless Tobacco Never Used  Tobacco Comment   quit 30 years ago     Goals Met:  Independence with exercise equipment Exercise tolerated well No report of cardiac concerns or symptoms Strength training completed today  Goals Unmet:  Not Applicable  Comments: Pt able to follow exercise prescription today without complaint.  Will continue to monitor for progression. Reviewed home exercise with pt today.  Pt plans to walk and go to [employer] for exercise.  Reviewed THR, pulse, RPE, sign and symptoms, NTG use, and when to call 911 or MD.  Also discussed weather considerations and indoor options.  Pt voiced understanding.    Dr. Emily Filbert is Medical Director for [facility] and [facility].

## 2017-09-15 ENCOUNTER — Encounter: Payer: Self-pay | Admitting: *Deleted

## 2017-09-15 DIAGNOSIS — Z955 Presence of coronary angioplasty implant and graft: Secondary | ICD-10-CM

## 2017-09-15 NOTE — Progress Notes (Signed)
Cardiac Individual Treatment Plan  Patient Details  Name: ZEPLIN ALESHIRE MRN: [id number] Date of Birth: 13-Dec-1945 Referring Provider:     Cardiac Rehab from 08/26/2017 in [facility] Cardiac and Pulmonary Rehab  Referring Provider  [name]      Initial Encounter Date:    Cardiac Rehab from 08/26/2017 in [facility] Cardiac and Pulmonary Rehab  Date  08/26/17  Referring Provider  [name]      Visit Diagnosis: Status post coronary artery stent placement  Patient's Home Medications on Admission:  Current Outpatient Medications:  .  allopurinol (ZYLOPRIM) 300 MG tablet, Take 0.5 tablets (150 mg total) daily by mouth., Disp: 90 tablet, Rfl: 0 .  aspirin EC 81 MG EC tablet, Take 1 tablet (81 mg total) by mouth daily., Disp: , Rfl:  .  atorvastatin (LIPITOR) 80 MG tablet, Take 80 mg by mouth daily., Disp: , Rfl:  .  clopidogrel (PLAVIX) 75 MG tablet, Take 75 mg by mouth daily., Disp: , Rfl:  .  hydrALAZINE (APRESOLINE) 25 MG tablet, TAKE 1 TABLET(25 MG) BY MOUTH THREE TIMES DAILY, Disp: 270 tablet, Rfl: 0 .  hydrochlorothiazide (HYDRODIURIL) 25 MG tablet, Take 25 mg by mouth daily., Disp: , Rfl:  .  metoprolol succinate (TOPROL-XL) 25 MG 24 hr tablet, Take 25 mg by mouth daily. , Disp: , Rfl:  .  metoprolol succinate (TOPROL-XL) 25 MG 24 hr tablet, TAKE 1 TABLET BY MOUTH EVERY DAY, Disp: 90 tablet, Rfl: 0 .  naproxen (NAPROSYN) 500 MG tablet, Take 1 tablet (500 mg total) 2 (two) times daily with a meal by mouth., Disp: 10 tablet, Rfl: 0 .  nitroGLYCERIN (NITROSTAT) 0.4 MG SL tablet, Place under the tongue., Disp: , Rfl:  .  telmisartan (MICARDIS) 40 MG tablet, Take 40 mg by mouth daily., Disp: , Rfl:   Past Medical History: Past Medical History:  Diagnosis Date  . CAD (coronary artery disease)   . Cancer [facility])    prostate  . Carotid artery stenosis   . Hyperlipidemia   . Hypertension   . Psoriasis     Tobacco Use: Social History   Tobacco Use  Smoking Status Former Smoker  . Types:  Cigarettes  Smokeless Tobacco Never Used  Tobacco Comment   quit 30 years ago     Labs: Recent Review Flowsheet Data    Labs for ITP Cardiac and Pulmonary Rehab Latest Ref Rng & Units 05/19/2016 11/18/2016 12/17/2016 05/20/2017 06/09/2017   Cholestrol 100 - 199 mg/dL 118 112 104 - 106   LDLCALC 0 - 99 mg/dL 44 - 39 - 42   HDL >39 mg/dL 40 - 41 - 41   Trlycerides 0 - 149 mg/dL 171(H) 264(H) 122 - 114   Hemoglobin A1c 4.8 - 5.6 % - - - 5.6 -       Exercise Target Goals:    Exercise Program Goal: Individual exercise prescription set using results from initial 6 min walk test and THRR while considering  patient's activity barriers and safety.   Exercise Prescription Goal: Initial exercise prescription builds to 30-45 minutes a day of aerobic activity, 2-3 days per week.  Home exercise guidelines will be given to patient during program as part of exercise prescription that the participant will acknowledge.  Activity Barriers & Risk Stratification:   6 Minute Walk: 6 Minute Walk    Row Name 08/26/17 1427         6 Minute Walk   Distance  1600 feet     Walk Time  6 minutes     # of Rest Breaks  0     MPH  3.03     METS  2.96     RPE  8     Perceived Dyspnea   0     VO2 Peak  10.37     Symptoms  No     Resting HR  88 bpm     Resting BP  110/64     Resting Oxygen Saturation   95 %     Max Ex. HR  103 bpm     Max Ex. BP  116/68     2 Minute Post BP  110/62        Oxygen Initial Assessment:   Oxygen Re-Evaluation:   Oxygen Discharge (Final Oxygen Re-Evaluation):   Initial Exercise Prescription: Initial Exercise Prescription - 08/26/17 1400      Date of Initial Exercise RX and Referring Provider   Date  08/26/17    Referring Provider  [name]      Treadmill   MPH  2.8    Grade  0.5    Minutes  15    METs  3.34      Recumbant Bike   Level  3    RPM  60    Watts  35    Minutes  15    METs  3      Elliptical   Level  1    Speed  3    Minutes  15       T5 Nustep   Level  2    SPM  80    Minutes  15    METs  3      Prescription Details   Frequency (times per week)  3    Duration  Progress to 30 minutes of continuous aerobic without signs/symptoms of physical distress      Intensity   THRR 40-80% of Max Heartrate  112-137    Ratings of Perceived Exertion  11-13    Perceived Dyspnea  0-4      Resistance Training   Training Prescription  Yes    Weight  5 lb    Reps  10-15       Perform Capillary Blood Glucose checks as needed.  Exercise Prescription Changes: Exercise Prescription Changes    Row Name 08/26/17 1400 09/06/17 1700 09/09/17 1000         Response to Exercise   Blood Pressure (Admit)  110/64  128/80  -     Blood Pressure (Exercise)  116/68  138/64  -     Blood Pressure (Exit)  110/62  114/68  -     Heart Rate (Admit)  88 bpm  56 bpm  -     Heart Rate (Exercise)  103 bpm  130 bpm  -     Heart Rate (Exit)  84 bpm  82 bpm  -     Oxygen Saturation (Admit)  95 %  -  -     Rating of Perceived Exertion (Exercise)  8  12  -     Symptoms  -  none  -     Comments  -  first full day of exercise  -     Duration  -  Continue with 30 min of aerobic exercise without signs/symptoms of physical distress.  -     Intensity  -  THRR unchanged  -       Progression  Progression  -  Continue to progress workloads to maintain intensity without signs/symptoms of physical distress.  -     Average METs  -  3.34  -       Resistance Training   Training Prescription  -  Yes  -     Weight  -  5 lb  -     Reps  -  10-15  -       Interval Training   Interval Training  -  No  -       Treadmill   MPH  -  2.8  -     Grade  -  0.5  -     Minutes  -  15  -     METs  -  3.34  -       Elliptical   Level  -  1  -     Speed  -  3  -     Minutes  -  15  -       Home Exercise Plan   Plans to continue exercise at  -  -  [employer] (comment) [employer]     Frequency  -  -  Add 3 additional days to program exercise  sessions.     Initial Home Exercises Provided  -  -  09/09/17        Exercise Comments: Exercise Comments    Row Name 08/31/17 0846           Exercise Comments   First full day of exercise!  Patient was oriented to gym and equipment including functions, settings, policies, and procedures.  Patient's individual exercise prescription and treatment plan were reviewed.  All starting workloads were established based on the results of the 6 minute walk test done at initial orientation visit.  The plan for exercise progression was also introduced and progression will be customized based on patient's performance and goals.          Exercise Goals and Review: Exercise Goals    Row Name 08/26/17 1426             Exercise Goals   Increase Physical Activity  Yes       Intervention  Provide advice, education, support and counseling about physical activity/exercise needs.;Develop an individualized exercise prescription for aerobic and resistive training based on initial evaluation findings, risk stratification, comorbidities and participant's personal goals.       Expected Outcomes  Achievement of increased cardiorespiratory fitness and enhanced flexibility, muscular endurance and strength shown through measurements of functional capacity and personal statement of participant.       Increase Strength and Stamina  Yes       Intervention  Provide advice, education, support and counseling about physical activity/exercise needs.;Develop an individualized exercise prescription for aerobic and resistive training based on initial evaluation findings, risk stratification, comorbidities and participant's personal goals.       Expected Outcomes  Achievement of increased cardiorespiratory fitness and enhanced flexibility, muscular endurance and strength shown through measurements of functional capacity and personal statement of participant.       Able to understand and use rate of perceived exertion (RPE)  scale  Yes       Intervention  Provide education and explanation on how to use RPE scale       Expected Outcomes  Short Term: Able to use RPE daily in rehab to express subjective intensity level;Long Term:  Able to use  RPE to guide intensity level when exercising independently       Able to understand and use Dyspnea scale  Yes       Intervention  Provide education and explanation on how to use Dyspnea scale       Expected Outcomes  Short Term: Able to use Dyspnea scale daily in rehab to express subjective sense of shortness of breath during exertion;Long Term: Able to use Dyspnea scale to guide intensity level when exercising independently       Knowledge and understanding of Target Heart Rate Range (THRR)  Yes       Intervention  Provide education and explanation of THRR including how the numbers were predicted and where they are located for reference       Expected Outcomes  Short Term: Able to state/look up THRR;Long Term: Able to use THRR to govern intensity when exercising independently;Short Term: Able to use daily as guideline for intensity in rehab       Able to check pulse independently  Yes       Intervention  Provide education and demonstration on how to check pulse in carotid and radial arteries.;Review the importance of being able to check your own pulse for safety during independent exercise       Expected Outcomes  Short Term: Able to explain why pulse checking is important during independent exercise;Long Term: Able to check pulse independently and accurately       Understanding of Exercise Prescription  Yes       Intervention  Provide education, explanation, and written materials on patient's individual exercise prescription       Expected Outcomes  Short Term: Able to explain program exercise prescription;Long Term: Able to explain home exercise prescription to exercise independently          Exercise Goals Re-Evaluation : Exercise Goals Re-Evaluation    Row Name 08/31/17 0846  09/06/17 1704 09/09/17 1050         Exercise Goal Re-Evaluation   Exercise Goals Review  Increase Physical Activity;Increase Strength and Stamina;Able to understand and use rate of perceived exertion (RPE) scale;Knowledge and understanding of Target Heart Rate Range (THRR);Understanding of Exercise Prescription  Increase Physical Activity;Increase Strength and Stamina;Understanding of Exercise Prescription  Understanding of Exercise Prescription;Knowledge and understanding of Target Heart Rate Range (THRR);Able to understand and use rate of perceived exertion (RPE) scale;Able to check pulse independently     Comments  Reviewed RPE scale, THR and program prescription with pt today.  Pt voiced understanding and was given a copy of goals to take home.   Ronald Pippins is off to a good start in rehab.  He has completed his first full day of exercise.  We will continue to monitor his progression.   Reviewed home exercise with pt today.  Pt plans to walk and go to [employer] for exercise.  Reviewed THR, pulse, RPE, sign and symptoms, NTG use, and when to call 911 or MD.  Also discussed weather considerations and indoor options.  Pt voiced understanding.     Expected Outcomes  Short: Use RPE daily to regulate intensity.  Long: Follow program prescription in THR.  Short: Attend rehab regularly.  Long: Continue to follow program prescription.   Short: Return to [employer] Long: Exericse regularly.         Discharge Exercise Prescription (Final Exercise Prescription Changes): Exercise Prescription Changes - 09/09/17 1000      Home Exercise Plan   Plans to continue exercise at  [employer] (comment) [employer]    Frequency  Add 3 additional days to program exercise sessions.    Initial Home Exercises Provided  09/09/17       Nutrition:  Target Goals: Understanding of nutrition guidelines, daily intake of sodium <1541m, cholesterol <2019m calories 30% from fat and 7% or less from saturated  fats, daily to have 5 or more servings of fruits and vegetables.  Biometrics: Pre Biometrics - 08/26/17 1425      Pre Biometrics   Height  5' 7.5" (1.715 m)    Weight  223 lb 14.4 oz (101.6 kg)    Waist Circumference  44 inches    Hip Circumference  45 inches    Waist to Hip Ratio  0.98 %    BMI (Calculated)  34.53    Single Leg Stand  30 seconds        Nutrition Therapy Plan and Nutrition Goals: Nutrition Therapy & Goals - 09/08/17 1535      Nutrition Therapy   Diet  TLC    Protein (specify units)  1-4oz each meal    Saturated Fats  15 max. grams    Fruits and Vegetables  5 servings/day    Sodium  1500 grams      Personal Nutrition Goals   Personal Goal #2  Continue to work on gradual changes to control food portions and make heart healthy (low saturated fat, high fiber/veg/fruit) food choices.     Additional Goals?  No    Comments  Mr. Gane feels he is doing well with diet, other than some struggle to lose weight. He has reduced food portions and plans to continue working to control caloric intake.        Nutrition Assessments: Nutrition Assessments - 08/26/17 1248      MEDFICTS Scores   Pre Score  40       Nutrition Goals Re-Evaluation: Nutrition Goals Re-Evaluation    Row Name 09/07/17 0911             Goals   Nutrition Goal  Meet with nurtitionist       Comment  made appointment to meet with dietician tomorrow afternoon.       Expected Outcome  Short: Meet with nutritionist  Long: Follow recommendations.          Nutrition Goals Discharge (Final Nutrition Goals Re-Evaluation): Nutrition Goals Re-Evaluation - 09/07/17 0911      Goals   Nutrition Goal  Meet with nurtitionist    Comment  made appointment to meet with dietician tomorrow afternoon.    Expected Outcome  Short: Meet with nutritionist  Long: Follow recommendations.       Psychosocial: Target Goals: Acknowledge presence or absence of significant depression and/or stress, maximize  coping skills, provide positive support system. Participant is able to verbalize types and ability to use techniques and skills needed for reducing stress and depression.   Initial Review & Psychosocial Screening: Initial Psych Review & Screening - 08/26/17 1242      Initial Review   Current issues with  Current Anxiety/Panic;Current Sleep Concerns;Current Stress Concerns    Source of Stress Concerns  Occupation;Unable to perform yard/household activities    Comments  Due to age and chronic illness unable to do all he wants to do for activities      [place] spouse      Barriers   Psychosocial barriers to participate in program  There are no  identifiable barriers or psychosocial needs.;The patient should benefit from training in stress management and relaxation.      Screening Interventions   Interventions  Encouraged to exercise;Provide feedback about the scores to participant;To provide support and resources with identified psychosocial needs;Yes    Expected Outcomes  Short Term goal: Utilizing psychosocial counselor, staff and physician to assist with identification of specific Stressors or current issues interfering with healing process. Setting desired goal for each stressor or current issue identified.;Long Term Goal: Stressors or current issues are controlled or eliminated.;Short Term goal: Identification and review with participant of any Quality of Life or Depression concerns found by scoring the questionnaire.;Long Term goal: The participant improves quality of Life and PHQ9 Scores as seen by post scores and/or verbalization of changes       Quality of Life Scores:  Quality of Life - 08/26/17 1247      Quality of Life Scores   Health/Function Pre  16.27 %    Socioeconomic Pre  20.38 %    Psych/Spiritual Pre  17.64 %    Family Pre  24 %    GLOBAL Pre  18.59 %      Scores of 19 and below usually indicate a poorer quality of life in these  areas.  A difference of  2-3 points is a clinically meaningful difference.  A difference of 2-3 points in the total score of the Quality of Life Index has been associated with significant improvement in overall quality of life, self-image, physical symptoms, and general health in studies assessing change in quality of life.  PHQ-9: Recent Review Flowsheet Data    Depression screen [facility] 2/9 08/26/2017 06/03/2017 11/18/2016 05/19/2016 05/09/2015   Decreased Interest 1 0 0 0 0   Down, Depressed, Hopeless 1 0 0 0 0   PHQ - 2 Score 2 0 0 0 0   Altered sleeping 2 - - - -   Tired, decreased energy 1 - - - -   Change in appetite 0 - - - -   Feeling bad or failure about yourself  0 - - - -   Trouble concentrating 0 - - - -   Moving slowly or fidgety/restless 0 - - - -   Suicidal thoughts 0 - - - -   PHQ-9 Score 5 - - - -   Difficult doing work/chores Not difficult at all - - - -     Interpretation of Total Score  Total Score Depression Severity:  1-4 = Minimal depression, 5-9 = Mild depression, 10-14 = Moderate depression, 15-19 = Moderately severe depression, 20-27 = Severe depression   Psychosocial Evaluation and Intervention: Psychosocial Evaluation - 08/31/17 0930      Psychosocial Evaluation & Interventions   Interventions  Encouraged to exercise with the program and follow exercise prescription;Relaxation education;Stress management education    Comments  Counselor met with Mr. Brodrick Ronald Pippins) today for initial psychosocial evaluation.  He is a 72 year old who had a heart cath and (3) stents inserted recently for blockages.  He has a strong support system with a spouse of 37 years; a host of nieces and nephews and a sister who lives locally.  Ronald Pippins is also actively involved in his local church community.  He reports sleeping "okay" although he has sleep apnea and indicated the CPAP "did not work" for him.  His appetite is "too good."  He denies a history of depression or anxiety or any current  symptoms.  Ronald Pippins states his mood  is typically positive most of the time.  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  He has goals to "feel better about exercising" since his procedure; and to get a baseline to know how to exercise on his own with confidence.  Staff will be following with Ronald Pippins throughout the course of this program.      Expected Outcomes  Ronald Pippins will benefit from consistent exercise to achieve his stated goals.  The educational and psychoeducational components of this program will be helpful in understanding his condition and learning positive ways to cope with the stress in his life.      Continue Psychosocial Services   Follow up required by staff       Psychosocial Re-Evaluation:   Psychosocial Discharge (Final Psychosocial Re-Evaluation):   Vocational Rehabilitation: Provide vocational rehab assistance to qualifying candidates.   Vocational Rehab Evaluation & Intervention: Vocational Rehab - 08/26/17 1244      Initial Vocational Rehab Evaluation & Intervention   Assessment shows need for Vocational Rehabilitation  No       Education: Education Goals: Education classes will be provided on a variety of topics geared toward better understanding of heart health and risk factor modification. Participant will state understanding/return demonstration of topics presented as noted by education test scores.  Learning Barriers/Preferences: Learning Barriers/Preferences - 08/26/17 1244      Learning Barriers/Preferences   Learning Barriers  None    Learning Preferences  Individual Instruction       Education Topics:  AED/CPR: - Group verbal and written instruction with the use of models to demonstrate the basic use of the AED with the basic ABC's of resuscitation.   General Nutrition Guidelines/Fats and Fiber: -Group instruction provided by verbal, written material, models and posters to present the general  guidelines for heart healthy nutrition. Gives an explanation and review of dietary fats and fiber.   Controlling Sodium/Reading Food Labels: -Group verbal and written material supporting the discussion of sodium use in heart healthy nutrition. Review and explanation with models, verbal and written materials for utilization of the food label.   Exercise Physiology & General Exercise Guidelines: - Group verbal and written instruction with models to review the exercise physiology of the cardiovascular system and associated critical values. Provides general exercise guidelines with specific guidelines to those with heart or lung disease.    Aerobic Exercise & Resistance Training: - Gives group verbal and written instruction on the various components of exercise. Focuses on aerobic and resistive training programs and the benefits of this training and how to safely progress through these programs..   Flexibility, Balance, Mind/Body Relaxation: Provides group verbal/written instruction on the benefits of flexibility and balance training, including mind/body exercise modes such as yoga, pilates and tai chi.  Demonstration and skill practice provided.   Stress and Anxiety: - Provides group verbal and written instruction about the health risks of elevated stress and causes of high stress.  Discuss the correlation between heart/lung disease and anxiety and treatment options. Review healthy ways to manage with stress and anxiety.   Cardiac Rehab from 09/09/2017 in [facility] Cardiac and Pulmonary Rehab  Date  09/07/17  Educator  [facility]  Instruction Review Code  1- Verbalizes Understanding      Depression: - Provides group verbal and written instruction on the correlation between heart/lung disease and depressed mood, treatment options, and the stigmas associated with seeking treatment.   Anatomy & Physiology of the  Heart: - Group verbal and written instruction and models provide basic cardiac anatomy and  physiology, with the coronary electrical and arterial systems. Review of Valvular disease and Heart Failure   Cardiac Rehab from 09/09/2017 in [facility] Cardiac and Pulmonary Rehab  Date  09/09/17  Educator  CE  Instruction Review Code  1- Verbalizes Understanding      Cardiac Procedures: - Group verbal and written instruction to review commonly prescribed medications for heart disease. Reviews the medication, class of the drug, and side effects. Includes the steps to properly store meds and maintain the prescription regimen. (beta blockers and nitrates)   Cardiac Medications I: - Group verbal and written instruction to review commonly prescribed medications for heart disease. Reviews the medication, class of the drug, and side effects. Includes the steps to properly store meds and maintain the prescription regimen.   Cardiac Rehab from 09/09/2017 in [facility] Cardiac and Pulmonary Rehab  Date  08/31/17  Educator  SB  Instruction Review Code  1- Verbalizes Understanding      Cardiac Medications II: -Group verbal and written instruction to review commonly prescribed medications for heart disease. Reviews the medication, class of the drug, and side effects. (all other drug classes)    Go Sex-Intimacy & Heart Disease, Get SMART - Goal Setting: - Group verbal and written instruction through game format to discuss heart disease and the return to sexual intimacy. Provides group verbal and written material to discuss and apply goal setting through the application of the S.M.A.R.T. Method.   Other Matters of the Heart: - Provides group verbal, written materials and models to describe Stable Angina and Peripheral Artery. Includes description of the disease process and treatment options available to the cardiac patient.   Cardiac Rehab from 09/09/2017 in [facility] Cardiac and Pulmonary Rehab  Date  09/09/17  Educator  CE  Instruction Review Code  1- Verbalizes Understanding      Exercise & Equipment  Safety: - Individual verbal instruction and demonstration of equipment use and safety with use of the equipment.   Cardiac Rehab from 09/09/2017 in [facility] Cardiac and Pulmonary Rehab  Date  08/26/17  Educator  Sb  Instruction Review Code  1- Verbalizes Understanding      Infection Prevention: - Provides verbal and written material to individual with discussion of infection control including proper hand washing and proper equipment cleaning during exercise session.   Cardiac Rehab from 09/09/2017 in [facility] Cardiac and Pulmonary Rehab  Date  08/26/17  Educator  Sb  Instruction Review Code  1- Verbalizes Understanding      Falls Prevention: - Provides verbal and written material to individual with discussion of falls prevention and safety.   Cardiac Rehab from 09/09/2017 in [facility]  Date  08/26/17  Educator  Sb  Instruction Review Code  1- Verbalizes Understanding      Diabetes: - Individual verbal and written instruction to review signs/symptoms of diabetes, desired ranges of glucose level fasting, after meals and with exercise. Acknowledge that pre and post exercise glucose checks will be done for 3 sessions at entry of program.   Know Your Numbers and Risk Factors: -Group verbal and written instruction about important numbers in your health.  Discussion of what are risk factors and how they play a role in the disease process.  Review of Cholesterol, Blood Pressure, Diabetes, and BMI and the role they play in your overall health.   Sleep Hygiene: -Provides group verbal and written instruction about how sleep  can affect your health.  Define sleep hygiene, discuss sleep cycles and impact of sleep habits. Review good sleep hygiene tips.    Other: -Provides group and verbal instruction on various topics (see comments)   Knowledge Questionnaire Score: Knowledge Questionnaire Score - 08/26/17 1244      Knowledge Questionnaire Score   Pre Score  24/28        Core Components/Risk Factors/Patient Goals at Admission:   Core Components/Risk Factors/Patient Goals Review:    Core Components/Risk Factors/Patient Goals at Discharge (Final Review):    ITP Comments: ITP Comments    Row Name 08/26/17 1250 09/15/17 0620         ITP Comments  Medical review comlpeted  ITP sent to Dr Elta Guadeloupe Sabra Heck for review, changes as needed and signature.  Diagnosis documentatincan be found in [facility] Visit 05/27/2017  30 Day review. Continue with ITP unless directed changes per Medical Director review.   New to program         Comments:

## 2017-09-17 DIAGNOSIS — Z85828 Personal history of other malignant neoplasm of skin: Secondary | ICD-10-CM | POA: Diagnosis not present

## 2017-09-17 DIAGNOSIS — L400 Psoriasis vulgaris: Secondary | ICD-10-CM | POA: Diagnosis not present

## 2017-09-17 DIAGNOSIS — X32XXXA Exposure to sunlight, initial encounter: Secondary | ICD-10-CM | POA: Diagnosis not present

## 2017-09-17 DIAGNOSIS — L570 Actinic keratosis: Secondary | ICD-10-CM | POA: Diagnosis not present

## 2017-09-21 ENCOUNTER — Encounter: Payer: Medicare Other | Attending: Internal Medicine | Admitting: *Deleted

## 2017-09-21 DIAGNOSIS — Z5181 Encounter for therapeutic drug level monitoring: Secondary | ICD-10-CM | POA: Diagnosis not present

## 2017-09-21 DIAGNOSIS — Z955 Presence of coronary angioplasty implant and graft: Secondary | ICD-10-CM | POA: Diagnosis not present

## 2017-09-21 DIAGNOSIS — L408 Other psoriasis: Secondary | ICD-10-CM | POA: Diagnosis not present

## 2017-09-21 NOTE — Progress Notes (Signed)
Daily Session Note  Patient Details  Name: Darrell [name] MRN: [id number] Date of Birth: 12/30/1945 Referring Provider:     Cardiac Rehab from 08/26/2017 in [facility]  Referring Provider  Kowalski      Encounter Date: 09/21/2017  Check In: Session Check In - 09/21/17 0824      Check-In   Location  [facility]    Staff Present   , MA, RCEP, CCRP, Exercise Physiologist;Amanda Sommer, BA, ACSM CEP, Exercise Physiologist;Susanne Bice, RN, BSN, CCRP    Supervising physician immediately available to respond to emergencies  See telemetry face sheet for immediately available ER MD    Medication changes reported      No    Fall or balance concerns reported     No    Warm-up and Cool-down  Performed on first and last piece of equipment    Resistance Training Performed  Yes    VAD Patient?  No      Pain Assessment   Currently in Pain?  No/denies          Social History   Tobacco Use  Smoking Status Former Smoker  . Types: Cigarettes  Smokeless Tobacco Never Used  Tobacco Comment   quit 30 years ago     Goals Met:  Independence with exercise equipment Exercise tolerated well No report of cardiac concerns or symptoms Strength training completed today  Goals Unmet:  Not Applicable  Comments: Pt able to follow exercise prescription today without complaint.  Will continue to monitor for progression.    Dr. Mark Miller is Medical Director for [facility] and [facility]. 

## 2017-09-23 VITALS — BP 104/58 | HR 109 | Wt 228.0 lb

## 2017-09-23 DIAGNOSIS — Z955 Presence of coronary angioplasty implant and graft: Secondary | ICD-10-CM

## 2017-09-23 NOTE — Progress Notes (Signed)
Daily Session Note  Patient Details  Name: Darrell [name] MRN: [id number] Date of Birth: 04/30/1946 Referring Provider:     Cardiac Rehab from 08/26/2017 in [facility]  Referring Provider  [name]      Encounter Date: 09/23/2017  Check In: Session Check In - 09/23/17 0935      Check-In   Location  [facility]    Staff Present  Alberteen Sam, MA, RCEP, CCRP, Exercise Physiologist;Stavros Cail Oletta Darter, BA, ACSM CEP, Exercise Physiologist;Carroll Enterkin, RN, BSN    Supervising physician immediately available to respond to emergencies  See telemetry face sheet for immediately available ER MD    Medication changes reported      No    Fall or balance concerns reported     No    Warm-up and Cool-down  Performed on first and last piece of equipment    Resistance Training Performed  Yes    VAD Patient?  No      Pain Assessment   Currently in Pain?  No/denies    Multiple Pain Sites  No        Exercise Prescription Changes - 09/22/17 1500      Response to Exercise   Blood Pressure (Admit)  116/62    Blood Pressure (Exercise)  128/70    Blood Pressure (Exit)  116/64    Heart Rate (Admit)  88 bpm    Heart Rate (Exercise)  136 bpm    Heart Rate (Exit)  87 bpm    Rating of Perceived Exertion (Exercise)  12    Symptoms  none    Duration  Continue with 30 min of aerobic exercise without signs/symptoms of physical distress.    Intensity  THRR unchanged      Progression   Progression  Continue to progress workloads to maintain intensity without signs/symptoms of physical distress.    Average METs  4.64      Resistance Training   Training Prescription  Yes    Weight  7 lbs    Reps  10-15      Interval Training   Interval Training  No      Treadmill   MPH  3.5    Grade  2    Minutes  15    METs  4.64      Elliptical   Level  1    Speed  3    Minutes  15      Home Exercise Plan   Plans to continue exercise at  [employer]  (comment) [employer]    Frequency  Add 3 additional days to program exercise sessions.    Initial Home Exercises Provided  09/09/17       Social History   Tobacco Use  Smoking Status Former Smoker  . Types: Cigarettes  Smokeless Tobacco Never Used  Tobacco Comment   quit 30 years ago     Goals Met:  Independence with exercise equipment Changing diet to healthy choices, watching portion sizes No report of cardiac concerns or symptoms Strength training completed today  Goals Unmet:  Not Applicable  Comments: Pt able to follow exercise prescription today without complaint.  Will continue to monitor for progression.    Dr. Emily Filbert is Medical Director for [place] and [facility].

## 2017-09-30 DIAGNOSIS — Z955 Presence of coronary angioplasty implant and graft: Secondary | ICD-10-CM

## 2017-09-30 NOTE — Progress Notes (Signed)
Daily Session Note  Patient Details  Name: Darrell [name] MRN: [id number] Date of Birth: 02/15/1946 Referring Provider:     Cardiac Rehab from 08/26/2017 in [facility]  Referring Provider  [name]      Encounter Date: 09/30/2017  Check In: Session Check In - 09/30/17 0845      Check-In   Location  [facility]    Staff Present  Alberteen Sam, MA, RCEP, CCRP, Exercise Physiologist;Amanda Oletta Darter, BA, ACSM CEP, Exercise Physiologist;Carroll Enterkin, RN, BSN    Supervising physician immediately available to respond to emergencies  See telemetry face sheet for immediately available ER MD    Medication changes reported      No    Fall or balance concerns reported     No    Warm-up and Cool-down  Performed on first and last piece of equipment    Resistance Training Performed  Yes    VAD Patient?  No      Pain Assessment   Currently in Pain?  No/denies    Multiple Pain Sites  No          Social History   Tobacco Use  Smoking Status Former Smoker  . Types: Cigarettes  Smokeless Tobacco Never Used  Tobacco Comment   quit 30 years ago     Goals Met:  Independence with exercise equipment Exercise tolerated well Personal goals reviewed No report of cardiac concerns or symptoms Strength training completed today  Goals Unmet:  Not Applicable  Comments: Pt able to follow exercise prescription today without complaint.  Will continue to monitor for progression. See ITP for goal review   Dr. Emily Filbert is Medical Director for [place] and [facility].

## 2017-10-07 DIAGNOSIS — Z87891 Personal history of nicotine dependence: Secondary | ICD-10-CM | POA: Diagnosis not present

## 2017-10-07 DIAGNOSIS — R079 Chest pain, unspecified: Secondary | ICD-10-CM | POA: Diagnosis not present

## 2017-10-07 DIAGNOSIS — I10 Essential (primary) hypertension: Secondary | ICD-10-CM | POA: Diagnosis not present

## 2017-10-07 DIAGNOSIS — I2510 Atherosclerotic heart disease of native coronary artery without angina pectoris: Secondary | ICD-10-CM | POA: Diagnosis not present

## 2017-10-07 DIAGNOSIS — R071 Chest pain on breathing: Secondary | ICD-10-CM | POA: Diagnosis not present

## 2017-10-07 DIAGNOSIS — Z955 Presence of coronary angioplasty implant and graft: Secondary | ICD-10-CM | POA: Diagnosis not present

## 2017-10-13 ENCOUNTER — Encounter: Payer: Self-pay | Admitting: *Deleted

## 2017-10-13 DIAGNOSIS — Z955 Presence of coronary angioplasty implant and graft: Secondary | ICD-10-CM

## 2017-10-13 NOTE — Progress Notes (Signed)
Cardiac Individual Treatment Plan  Patient Details  Name: MCCAULEY DIEHL MRN: [id number] Date of Birth: [birth year] Referring Provider:     Cardiac Rehab from 71/05/2018 in [facility] Cardiac and Pulmonary Rehab  Referring Provider  [name]      Initial Encounter Date:    Cardiac Rehab from 71/05/2018 in [facility] Cardiac and Pulmonary Rehab  Date  08/26/17  Referring Provider  [name]      Visit Diagnosis: Status post coronary artery stent placement  Patient's Home Medications on Admission:  Current Outpatient Medications:  .  allopurinol (ZYLOPRIM) 300 MG tablet, Take 0.5 tablets (150 mg total) daily by mouth., Disp: 90 tablet, Rfl: 0 .  aspirin EC 81 MG EC tablet, Take 1 tablet (81 mg total) by mouth daily., Disp: , Rfl:  .  atorvastatin (LIPITOR) 80 MG tablet, Take 80 mg by mouth daily., Disp: , Rfl:  .  clopidogrel (PLAVIX) 75 MG tablet, Take 75 mg by mouth daily., Disp: , Rfl:  .  hydrALAZINE (APRESOLINE) 25 MG tablet, TAKE 1 TABLET(25 MG) BY MOUTH THREE TIMES DAILY, Disp: 270 tablet, Rfl: 0 .  hydrochlorothiazide (HYDRODIURIL) 25 MG tablet, Take 25 mg by mouth daily., Disp: , Rfl:  .  metoprolol succinate (TOPROL-XL) 25 MG 24 hr tablet, Take 25 mg by mouth daily. , Disp: , Rfl:  .  metoprolol succinate (TOPROL-XL) 25 MG 24 hr tablet, TAKE 1 TABLET BY MOUTH EVERY DAY, Disp: 90 tablet, Rfl: 0 .  naproxen (NAPROSYN) 500 MG tablet, Take 1 tablet (500 mg total) 2 (two) times daily with a meal by mouth., Disp: 10 tablet, Rfl: 0 .  nitroGLYCERIN (NITROSTAT) 0.4 MG SL tablet, Place under the tongue., Disp: , Rfl:  .  telmisartan (MICARDIS) 40 MG tablet, Take 40 mg by mouth daily., Disp: , Rfl:   Past Medical History: Past Medical History:  Diagnosis Date  . CAD (coronary artery disease)   . Cancer [facility])    prostate  . Carotid artery stenosis   . Hyperlipidemia   . Hypertension   . Psoriasis     Tobacco Use: Social History   Tobacco Use  Smoking Status Former Smoker  . Types:  Cigarettes  Smokeless Tobacco Never Used  Tobacco Comment   quit 30 years ago     Labs: Recent Review Flowsheet Data    Labs for ITP Cardiac and Pulmonary Rehab Latest Ref Rng & Units 05/19/2016 11/18/2016 12/17/2016 05/20/2017 06/09/2017   Cholestrol 100 - 199 mg/dL 118 112 104 - 106   LDLCALC 0 - 99 mg/dL 44 - 39 - 42   HDL >39 mg/dL 40 - 41 - 41   Trlycerides 0 - 149 mg/dL 171(H) 264(H) 122 - 114   Hemoglobin A1c 4.8 - 5.6 % - - - 5.6 -       Exercise Target Goals:    Exercise Program Goal: Individual exercise prescription set using results from initial 6 min walk test and THRR while considering  patient's activity barriers and safety.   Exercise Prescription Goal: Initial exercise prescription builds to 30-45 minutes a day of aerobic activity, 2-3 days per week.  Home exercise guidelines will be given to patient during program as part of exercise prescription that the participant will acknowledge.  Activity Barriers & Risk Stratification:   6 Minute Walk: 6 Minute Walk    Row Name 08/26/17 1427         6 Minute Walk   Distance  1600 feet     Walk Time  6 minutes     # of Rest Breaks  0     MPH  3.03     METS  2.96     RPE  8     Perceived Dyspnea   0     VO2 Peak  10.37     Symptoms  No     Resting HR  88 bpm     Resting BP  110/64     Resting Oxygen Saturation   95 %     Max Ex. HR  103 bpm     Max Ex. BP  116/68     2 Minute Post BP  110/62        Oxygen Initial Assessment:   Oxygen Re-Evaluation:   Oxygen Discharge (Final Oxygen Re-Evaluation):   Initial Exercise Prescription: Initial Exercise Prescription - 08/26/17 1400      Date of Initial Exercise RX and Referring Provider   Date  08/26/17    Referring Provider  [name]      Treadmill   MPH  2.8    Grade  0.5    Minutes  15    METs  3.34      Recumbant Bike   Level  3    RPM  60    Watts  35    Minutes  15    METs  3      Elliptical   Level  1    Speed  3    Minutes  15       T5 Nustep   Level  2    SPM  80    Minutes  15    METs  3      Prescription Details   Frequency (times per week)  3    Duration  Progress to 30 minutes of continuous aerobic without signs/symptoms of physical distress      Intensity   THRR 40-80% of Max Heartrate  112-137    Ratings of Perceived Exertion  11-13    Perceived Dyspnea  0-4      Resistance Training   Training Prescription  Yes    Weight  5 lb    Reps  10-15       Perform Capillary Blood Glucose checks as needed.  Exercise Prescription Changes: Exercise Prescription Changes    Row Name 08/26/17 1400 09/06/17 1700 09/09/17 1000 09/22/17 1500 10/05/17 1600     Response to Exercise   Blood Pressure (Admit)  110/64  128/80  -  116/62  134/68   Blood Pressure (Exercise)  116/68  138/64  -  128/70  124/70   Blood Pressure (Exit)  110/62  114/68  -  116/64  124/60   Heart Rate (Admit)  88 bpm  56 bpm  -  88 bpm  83 bpm   Heart Rate (Exercise)  103 bpm  130 bpm  -  136 bpm  110 bpm   Heart Rate (Exit)  84 bpm  82 bpm  -  87 bpm  93 bpm   Oxygen Saturation (Admit)  95 %  -  -  -  -   Rating of Perceived Exertion (Exercise)  8  12  -  12  12   Symptoms  -  none  -  none  none   Comments  -  first full day of exercise  -  -  -   Duration  -  Continue with 30 min of aerobic exercise without  signs/symptoms of physical distress.  -  Continue with 30 min of aerobic exercise without signs/symptoms of physical distress.  Continue with 30 min of aerobic exercise without signs/symptoms of physical distress.   Intensity  -  THRR unchanged  -  THRR unchanged  THRR unchanged     Progression   Progression  -  Continue to progress workloads to maintain intensity without signs/symptoms of physical distress.  -  Continue to progress workloads to maintain intensity without signs/symptoms of physical distress.  Continue to progress workloads to maintain intensity without signs/symptoms of physical distress.   Average METs  -  3.34  -   4.64  4.25     Resistance Training   Training Prescription  -  Yes  -  Yes  Yes   Weight  -  5 lb  -  7 lbs  7 lbs   Reps  -  10-15  -  10-15  10-15     Interval Training   Interval Training  -  No  -  No  No     Treadmill   MPH  -  2.8  -  3.5  3.5   Grade  -  0.5  -  2  2   Minutes  -  15  -  15  15   METs  -  3.34  -  4.64  4.64     Recumbant Bike   Level  -  -  -  -  6   Watts  -  -  -  -  57   Minutes  -  -  -  -  15   METs  -  -  -  -  3.89     Elliptical   Level  -  1  -  1  1   Speed  -  3  -  3  3   Minutes  -  15  -  15  15     Home Exercise Plan   Plans to continue exercise at  -  -  [employer] (comment) [employer] (comment) [employer] (comment) [employer]   Frequency  -  -  Add 3 additional days to program exercise sessions.  Add 3 additional days to program exercise sessions.  Add 3 additional days to program exercise sessions.   Initial Home Exercises Provided  -  -  09/09/17  09/09/17  09/09/17      Exercise Comments: Exercise Comments    Row Name 08/31/17 [phone]           Exercise Comments   First full day of exercise!  Patient was oriented to gym and equipment including functions, settings, policies, and procedures.  Patient's individual exercise prescription and treatment plan were reviewed.  All starting workloads were established based on the results of the 6 minute walk test done at initial orientation visit.  The plan for exercise progression was also introduced and progression will be customized based on patient's performance and goals.          Exercise Goals and Review: Exercise Goals    Row Name 08/26/17 1426             Exercise Goals   Increase Physical Activity  Yes       Intervention  Provide advice, education, support and counseling about physical activity/exercise needs.;Develop an individualized exercise prescription for aerobic and resistive training based on  initial  evaluation findings, risk stratification, comorbidities and participant's personal goals.       Expected Outcomes  Achievement of increased cardiorespiratory fitness and enhanced flexibility, muscular endurance and strength shown through measurements of functional capacity and personal statement of participant.       Increase Strength and Stamina  Yes       Intervention  Provide advice, education, support and counseling about physical activity/exercise needs.;Develop an individualized exercise prescription for aerobic and resistive training based on initial evaluation findings, risk stratification, comorbidities and participant's personal goals.       Expected Outcomes  Achievement of increased cardiorespiratory fitness and enhanced flexibility, muscular endurance and strength shown through measurements of functional capacity and personal statement of participant.       Able to understand and use rate of perceived exertion (RPE) scale  Yes       Intervention  Provide education and explanation on how to use RPE scale       Expected Outcomes  Short Term: Able to use RPE daily in rehab to express subjective intensity level;Long Term:  Able to use RPE to guide intensity level when exercising independently       Able to understand and use Dyspnea scale  Yes       Intervention  Provide education and explanation on how to use Dyspnea scale       Expected Outcomes  Short Term: Able to use Dyspnea scale daily in rehab to express subjective sense of shortness of breath during exertion;Long Term: Able to use Dyspnea scale to guide intensity level when exercising independently       Knowledge and understanding of Target Heart Rate Range (THRR)  Yes       Intervention  Provide education and explanation of THRR including how the numbers were predicted and where they are located for reference       Expected Outcomes  Short Term: Able to state/look up THRR;Long Term: Able to use THRR to govern intensity when exercising  independently;Short Term: Able to use daily as guideline for intensity in rehab       Able to check pulse independently  Yes       Intervention  Provide education and demonstration on how to check pulse in carotid and radial arteries.;Review the importance of being able to check your own pulse for safety during independent exercise       Expected Outcomes  Short Term: Able to explain why pulse checking is important during independent exercise;Long Term: Able to check pulse independently and accurately       Understanding of Exercise Prescription  Yes       Intervention  Provide education, explanation, and written materials on patient's individual exercise prescription       Expected Outcomes  Short Term: Able to explain program exercise prescription;Long Term: Able to explain home exercise prescription to exercise independently          Exercise Goals Re-Evaluation : Exercise Goals Re-Evaluation    Row Name 08/31/17 0846 09/06/17 1704 09/09/17 1050 09/22/17 1527 09/30/17 0903     Exercise Goal Re-Evaluation   Exercise Goals Review  Increase Physical Activity;Increase Strength and Stamina;Able to understand and use rate of perceived exertion (RPE) scale;Knowledge and understanding of Target Heart Rate Range (THRR);Understanding of Exercise Prescription  Increase Physical Activity;Increase Strength and Stamina;Understanding of Exercise Prescription  Understanding of Exercise Prescription;Knowledge and understanding of Target Heart Rate Range (THRR);Able to understand and use rate of perceived exertion (RPE) scale;Able to  check pulse independently  Increase Physical Activity;Understanding of Exercise Prescription;Increase Strength and Stamina  Increase Physical Activity;Understanding of Exercise Prescription;Increase Strength and Stamina   Comments  Reviewed RPE scale, THR and program prescription with pt today.  Pt voiced understanding and was given a copy of goals to take home.   Ronald Pippins is off to a  good start in rehab.  He has completed his first full day of exercise.  We will continue to monitor his progression.   Reviewed home exercise with pt today.  Pt plans to walk and go to [employer] for exercise.  Reviewed THR, pulse, RPE, sign and symptoms, NTG use, and when to call 911 or MD.  Also discussed weather considerations and indoor options.  Pt voiced understanding.  [name] has been doing well in rehab.  He is going to [employer] and is already up to 3.5 mph on the treadmill.  We will continue to monitor his progression.   [name] has been going to [employer] 4-5 days a week and does weights and cardio.  He is not warming up first.  He is feeling stronger and has more stamina.    Expected Outcomes  Short: Use RPE daily to regulate intensity.  Long: Follow program prescription in THR.  Short: Attend rehab regularly.  Long: Continue to follow program prescription.   Short: Return to [employer] Long: Exericse regularly.   Short: Continue to build workload on ellipitical.  Long: Continue to exericse more on off days.   Short: Add warm-up to routine.  Long: Continue to exercise on off days.    [place] Name 10/05/17 1621             Exercise Goal Re-Evaluation   Exercise Goals Review  Increase Physical Activity;Understanding of Exercise Prescription;Increase Strength and Stamina       Comments  [name] continues to do well in rehab.  This week he is visiting his granddaughter in [place].  He is now up to level 6 on the bike.  We will continue to monitor his progression.        Expected Outcomes  Short: Add warm up into routine at home.  Long: Continue to exercise daily for stress relief.          Discharge Exercise Prescription (Final Exercise Prescription Changes): Exercise Prescription Changes - 10/05/17 1600      Response to Exercise   Blood Pressure (Admit)  134/68    Blood Pressure (Exercise)  124/70    Blood Pressure (Exit)  124/60    Heart Rate (Admit)  83 bpm    Heart Rate  (Exercise)  110 bpm    Heart Rate (Exit)  93 bpm    Rating of Perceived Exertion (Exercise)  12    Symptoms  none    Duration  Continue with 30 min of aerobic exercise without signs/symptoms of physical distress.    Intensity  THRR unchanged      Progression   Progression  Continue to progress workloads to maintain intensity without signs/symptoms of physical distress.    Average METs  4.25      Resistance Training   Training Prescription  Yes    Weight  7 lbs    Reps  10-15      Interval Training   Interval Training  No      Treadmill   MPH  3.5    Grade  2    Minutes  15    METs  4.64  Recumbant Bike   Level  6    Watts  57    Minutes  15    METs  3.89      Elliptical   Level  1    Speed  3    Minutes  15      Home Exercise Plan   Plans to continue exercise at  [employer] (comment) [employer]    Frequency  Add 3 additional days to program exercise sessions.    Initial Home Exercises Provided  09/09/17       Nutrition:  Target Goals: Understanding of nutrition guidelines, daily intake of sodium <1590m, cholesterol <2036m calories 30% from fat and 7% or less from saturated fats, daily to have 5 or more servings of fruits and vegetables.  Biometrics: Pre Biometrics - 08/26/17 1425      Pre Biometrics   Height  5' 7.5" (1.715 m)    Weight  223 lb 14.4 oz (101.6 kg)    Waist Circumference  44 inches    Hip Circumference  45 inches    Waist to Hip Ratio  0.98 %    BMI (Calculated)  34.53    Single Leg Stand  30 seconds        Nutrition Therapy Plan and Nutrition Goals: Nutrition Therapy & Goals - 09/08/17 1535      Nutrition Therapy   Diet  TLC    Protein (specify units)  1-4oz each meal    Saturated Fats  15 max. grams    Fruits and Vegetables  5 servings/day    Sodium  1500 grams      Personal Nutrition Goals   Personal Goal #2  Continue to work on gradual changes to control food portions and make heart healthy (low saturated  fat, high fiber/veg/fruit) food choices.     Additional Goals?  No    Comments  Mr. Everly feels he is doing well with diet, other than some struggle to lose weight. He has reduced food portions and plans to continue working to control caloric intake.        Nutrition Assessments: Nutrition Assessments - 08/26/17 1248      MEDFICTS Scores   Pre Score  40       Nutrition Goals Re-Evaluation: Nutrition Goals Re-Evaluation    Row Name 09/07/17 0911 09/30/17 0908           Goals   Nutrition Goal  Meet with nurtitionist  Heart healty choices, more fruits and vegetables, less fried foods, continue with portion control      Comment  made appointment to meet with dietician tomorrow afternoon.  JeSonia Sides dong better with portion control.  He is eating more fruits and vegetables.  He is still struggling with fried foods as he enjoys eating.        Expected Outcome  Short: Meet with nutritionist  Long: Follow recommendations.  Short: Cut back on fried foods.  Long: Continue to follow recommendations and heart healthy diet.          Nutrition Goals Discharge (Final Nutrition Goals Re-Evaluation): Nutrition Goals Re-Evaluation - 09/30/17 0908      Goals   Nutrition Goal  Heart healty choices, more fruits and vegetables, less fried foods, continue with portion control    Comment  JeSonia Sides dong better with portion control.  He is eating more fruits and vegetables.  He is still struggling with fried foods as he enjoys eating.      Expected  Outcome  Short: Cut back on fried foods.  Long: Continue to follow recommendations and heart healthy diet.        Psychosocial: Target Goals: Acknowledge presence or absence of significant depression and/or stress, maximize coping skills, provide positive support system. Participant is able to verbalize types and ability to use techniques and skills needed for reducing stress and depression.   Initial Review & Psychosocial Screening: Initial Psych Review  & Screening - 08/26/17 1242      Initial Review   Current issues with  Current Anxiety/Panic;Current Sleep Concerns;Current Stress Concerns    Source of Stress Concerns  Occupation;Unable to perform yard/household activities    Comments  Due to age and chronic illness unable to do all he wants to do for activities      [place]?  Yes spouse      Barriers   Psychosocial barriers to participate in program  There are no identifiable barriers or psychosocial needs.;The patient should benefit from training in stress management and relaxation.      Screening Interventions   Interventions  Encouraged to exercise;Provide feedback about the scores to participant;To provide support and resources with identified psychosocial needs;Yes    Expected Outcomes  Short Term goal: Utilizing psychosocial counselor, staff and physician to assist with identification of specific Stressors or current issues interfering with healing process. Setting desired goal for each stressor or current issue identified.;Long Term Goal: Stressors or current issues are controlled or eliminated.;Short Term goal: Identification and review with participant of any Quality of Life or Depression concerns found by scoring the questionnaire.;Long Term goal: The participant improves quality of Life and PHQ9 Scores as seen by post scores and/or verbalization of changes       Quality of Life Scores:  Quality of Life - 08/26/17 1247      Quality of Life Scores   Health/Function Pre  16.27 %    Socioeconomic Pre  20.38 %    Psych/Spiritual Pre  17.64 %    Family Pre  24 %    GLOBAL Pre  18.59 %      Scores of 19 and below usually indicate a poorer quality of life in these areas.  A difference of  2-3 points is a clinically meaningful difference.  A difference of 2-3 points in the total score of the Quality of Life Index has been associated with significant improvement in overall quality of life, self-image,  physical symptoms, and general health in studies assessing change in quality of life.  PHQ-9: Recent Review Flowsheet Data    Depression screen [facility] 2/9 08/26/2017 06/03/2017 11/18/2016 05/19/2016 05/09/2015   Decreased Interest 1 0 0 0 0   Down, Depressed, Hopeless 1 0 0 0 0   PHQ - 2 Score 2 0 0 0 0   Altered sleeping 2 - - - -   Tired, decreased energy 1 - - - -   Change in appetite 0 - - - -   Feeling bad or failure about yourself  0 - - - -   Trouble concentrating 0 - - - -   Moving slowly or fidgety/restless 0 - - - -   Suicidal thoughts 0 - - - -   PHQ-9 Score 5 - - - -   Difficult doing work/chores Not difficult at all - - - -     Interpretation of Total Score  Total Score Depression Severity:  1-4 = Minimal depression, 5-9 = Mild depression,  10-14 = Moderate depression, 15-19 = Moderately severe depression, 20-27 = Severe depression   Psychosocial Evaluation and Intervention: Psychosocial Evaluation - 08/31/17 0930      Psychosocial Evaluation & Interventions   Interventions  Encouraged to exercise with the program and follow exercise prescription;Relaxation education;Stress management education    Comments  Counselor met with Mr. Witz Ronald Pippins) today for initial psychosocial evaluation.  He is a 72 year old who had a heart cath and (3) stents inserted recently for blockages.  He has a strong support system with a spouse of 59 years; a host of nieces and nephews and a sister who lives locally.  Ronald Pippins is also actively involved in his local church community.  He reports sleeping "okay" although he has sleep apnea and indicated the CPAP "did not work" for him.  His appetite is "too good."  He denies a history of depression or anxiety or any current symptoms.  Ronald Pippins states his mood is typically positive most of the time.  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  He has goals to "feel better about exercising" since his procedure; and to  get a baseline to know how to exercise on his own with confidence.  Staff will be following with Ronald Pippins throughout the course of this program.      Expected Outcomes  Ronald Pippins will benefit from consistent exercise to achieve his stated goals.  The educational and psychoeducational components of this program will be helpful in understanding his condition and learning positive ways to cope with the stress in his life.      Continue Psychosocial Services   Follow up required by staff       Psychosocial Re-Evaluation: Psychosocial Re-Evaluation    [place] Name 09/30/17 0910             Psychosocial Re-Evaluation   Current issues with  Current Stress Concerns       Comments  [name] is going to visit his grandson next week in [place] for his second birthday.  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  He is looking forward to having time off next week.  They can't even go out to eat without people coming up to visit with him.  His wife is very supportive of him.         Expected Outcomes  Short: Enjoy trip to [place]: Continue to cope well with stressors.        Interventions  Encouraged to attend Cardiac Rehabilitation for the exercise;Stress management education       Continue Psychosocial Services   Follow up required by staff         Initial Review   Source of Stress Concerns  Occupation;Unable to perform yard/household activities          Psychosocial Discharge (Final Psychosocial Re-Evaluation): Psychosocial Re-Evaluation - 09/30/17 0910      Psychosocial Re-Evaluation   Current issues with  Current Stress Concerns    Comments  [name] is going to visit his grandson next week in [place] for his second birthday.  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  He is looking forward to having time off next week.  They can't even go out to eat without people coming up to visit with him.  His wife is very supportive of him.      Expected Outcomes  Short: Enjoy trip  to [place]: Continue  to cope well with stressors.     Interventions  Encouraged to attend Cardiac Rehabilitation for the exercise;Stress management education    Continue Psychosocial Services   Follow up required by staff      Initial Review   Source of Stress Concerns  Occupation;Unable to perform yard/household activities       Vocational Rehabilitation: Provide vocational rehab assistance to qualifying candidates.   Vocational Rehab Evaluation & Intervention: Vocational Rehab - 08/26/17 1244      Initial Vocational Rehab Evaluation & Intervention   Assessment shows need for Vocational Rehabilitation  No       Education: Education Goals: Education classes will be provided on a variety of topics geared toward better understanding of heart health and risk factor modification. Participant will state understanding/return demonstration of topics presented as noted by education test scores.  Learning Barriers/Preferences: Learning Barriers/Preferences - 08/26/17 1244      Learning Barriers/Preferences   Learning Barriers  None    Learning Preferences  Individual Instruction       Education Topics:  AED/CPR: - Group verbal and written instruction with the use of models to demonstrate the basic use of the AED with the basic ABC's of resuscitation.   General Nutrition Guidelines/Fats and Fiber: -Group instruction provided by verbal, written material, models and posters to present the general guidelines for heart healthy nutrition. Gives an explanation and review of dietary fats and fiber.   Controlling Sodium/Reading Food Labels: -Group verbal and written material supporting the discussion of sodium use in heart healthy nutrition. Review and explanation with models, verbal and written materials for utilization of the food label.   Cardiac Rehab from 09/30/2017 in [facility] Cardiac and Pulmonary Rehab  Date  09/21/17  Educator  CR  Instruction Review Code  1- Verbalizes  Understanding      Exercise Physiology & General Exercise Guidelines: - Group verbal and written instruction with models to review the exercise physiology of the cardiovascular system and associated critical values. Provides general exercise guidelines with specific guidelines to those with heart or lung disease.    Aerobic Exercise & Resistance Training: - Gives group verbal and written instruction on the various components of exercise. Focuses on aerobic and resistive training programs and the benefits of this training and how to safely progress through these programs..   Flexibility, Balance, Mind/Body Relaxation: Provides group verbal/written instruction on the benefits of flexibility and balance training, including mind/body exercise modes such as yoga, pilates and tai chi.  Demonstration and skill practice provided.   Stress and Anxiety: - Provides group verbal and written instruction about the health risks of elevated stress and causes of high stress.  Discuss the correlation between heart/lung disease and anxiety and treatment options. Review healthy ways to manage with stress and anxiety.   Cardiac Rehab from 09/30/2017 in [facility] Cardiac and Pulmonary Rehab  Date  09/07/17  Educator  [facility]  Instruction Review Code  1- Verbalizes Understanding      Depression: - Provides group verbal and written instruction on the correlation between heart/lung disease and depressed mood, treatment options, and the stigmas associated with seeking treatment.   Anatomy & Physiology of the Heart: - Group verbal and written instruction and models provide basic cardiac anatomy and physiology, with the coronary electrical and arterial systems. Review of Valvular disease and Heart Failure   Cardiac Rehab from 09/30/2017 in [facility] Cardiac and Pulmonary Rehab  Date  09/09/17  Educator  CE  Instruction Review Code  1- [employer]  Understanding      Cardiac Procedures: - Group verbal and written instruction  to review commonly prescribed medications for heart disease. Reviews the medication, class of the drug, and side effects. Includes the steps to properly store meds and maintain the prescription regimen. (beta blockers and nitrates)   Cardiac Rehab from 09/30/2017 in [facility] Cardiac and Pulmonary Rehab  Date  09/23/17  Educator  CE  Instruction Review Code  1- Verbalizes Understanding      Cardiac Medications I: - Group verbal and written instruction to review commonly prescribed medications for heart disease. Reviews the medication, class of the drug, and side effects. Includes the steps to properly store meds and maintain the prescription regimen.   Cardiac Rehab from 09/30/2017 in [facility] Cardiac and Pulmonary Rehab  Date  08/31/17  Educator  SB  Instruction Review Code  1- Verbalizes Understanding      Cardiac Medications II: -Group verbal and written instruction to review commonly prescribed medications for heart disease. Reviews the medication, class of the drug, and side effects. (all other drug classes)    Go Sex-Intimacy & Heart Disease, Get SMART - Goal Setting: - Group verbal and written instruction through game format to discuss heart disease and the return to sexual intimacy. Provides group verbal and written material to discuss and apply goal setting through the application of the S.M.A.R.T. Method.   Cardiac Rehab from 09/30/2017 in [facility] Cardiac and Pulmonary Rehab  Date  09/23/17  Educator  CE  Instruction Review Code  1- Verbalizes Understanding      Other Matters of the Heart: - Provides group verbal, written materials and models to describe Stable Angina and Peripheral Artery. Includes description of the disease process and treatment options available to the cardiac patient.   Cardiac Rehab from 09/30/2017 in [facility] Cardiac and Pulmonary Rehab  Date  09/09/17  Educator  CE  Instruction Review Code  1- Verbalizes Understanding      Exercise & Equipment Safety: -  Individual verbal instruction and demonstration of equipment use and safety with use of the equipment.   Cardiac Rehab from 09/30/2017 in [facility] Cardiac and Pulmonary Rehab  Date  08/26/17  Educator  Sb  Instruction Review Code  1- Verbalizes Understanding      Infection Prevention: - Provides verbal and written material to individual with discussion of infection control including proper hand washing and proper equipment cleaning during exercise session.   Cardiac Rehab from 09/30/2017 in [facility] Cardiac and Pulmonary Rehab  Date  08/26/17  Educator  Sb  Instruction Review Code  1- Verbalizes Understanding      Falls Prevention: - Provides verbal and written material to individual with discussion of falls prevention and safety.   Cardiac Rehab from 09/30/2017 in [facility] Cardiac and Pulmonary Rehab  Date  08/26/17  Educator  Sb  Instruction Review Code  1- Verbalizes Understanding      Diabetes: - Individual verbal and written instruction to review signs/symptoms of diabetes, desired ranges of glucose level fasting, after meals and with exercise. Acknowledge that pre and post exercise glucose checks will be done for 3 sessions at entry of program.   Know Your Numbers and Risk Factors: -Group verbal and written instruction about important numbers in your health.  Discussion of what are risk factors and how they play a role in the disease process.  Review of Cholesterol, Blood Pressure, Diabetes, and BMI and the role they play in your overall health.   Sleep Hygiene: -Provides group verbal  and written instruction about how sleep can affect your health.  Define sleep hygiene, discuss sleep cycles and impact of sleep habits. Review good sleep hygiene tips.    Other: -Provides group and verbal instruction on various topics (see comments)   Knowledge Questionnaire Score: Knowledge Questionnaire Score - 08/26/17 1244      Knowledge Questionnaire Score   Pre Score  24/28       Core  Components/Risk Factors/Patient Goals at Admission:   Core Components/Risk Factors/Patient Goals Review:  Goals and Risk Factor Review    Row Name 09/30/17 0904             Core Components/Risk Factors/Patient Goals Review   Personal Goals Review  Weight Management/Obesity;Hypertension;Lipids       Review  [name] blood pressures have been good.  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  His weight is up higher than  he wants as he is still not eating perfectly. He has still been eating some fried foods but trying to get better.  We talked about adding intervals to help boosting weight loss.   Medications seem to working for now.  He has follow up appointment with cards but they did not do blood work.        Expected Outcomes  Short: Add in intervals to help with weight loss.  Long: Continue to work on risk factor modifications.           Core Components/Risk Factors/Patient Goals at Discharge (Final Review):  Goals and Risk Factor Review - 09/30/17 0904      Core Components/Risk Factors/Patient Goals Review   Personal Goals Review  Weight Management/Obesity;Hypertension;Lipids    Review  [name] blood pressures have been good.  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  His weight is up higher than  he wants as he is still not eating perfectly. He has still been eating some fried foods but trying to get better.  We talked about adding intervals to help boosting weight loss.   Medications seem to working for now.  He has follow up appointment with cards but they did not do blood work.     Expected Outcomes  Short: Add in intervals to help with weight loss.  Long: Continue to work on risk factor modifications.        ITP Comments: ITP Comments    Row Name 08/26/17 1250 09/15/17 0620 09/23/17 1035 10/13/17 0627     ITP Comments  Medical review comlpeted  ITP sent to Dr Elta Guadeloupe Sabra Heck for review, changes as needed and signature.  Diagnosis documentatincan be found  in [facility] Visit 05/27/2017  30 Day review. Continue with ITP unless directed changes per Medical Director review.   New to program  Multiple PVC started when he was using small hand weights in Cardiac Rehab. Dayshon said he didn't feel PVCs but did feel a little light headed. Blood pressure was 104/58 given 360 cc of water. Blood pressure came up to 118/70. Will fax PVC's to MD.   30 day review. Continue with ITP unless directed changes per Medical Director review. only 3 visits this month       Comments:

## 2017-10-14 ENCOUNTER — Encounter: Payer: Medicare Other | Admitting: *Deleted

## 2017-10-14 DIAGNOSIS — Z955 Presence of coronary angioplasty implant and graft: Secondary | ICD-10-CM | POA: Diagnosis not present

## 2017-10-14 NOTE — Progress Notes (Signed)
Daily Session Note  Patient Details  Name: Darrell [name] MRN: [id number] Date of Birth: 1945-09-29 Referring Provider:     Cardiac Rehab from 08/26/2017 in [facility] Cardiac and Pulmonary Rehab  Referring Provider  [name]      Encounter Date: 10/14/2017  Check In: Session Check In - 10/14/17 0830      Check-In   Location  [facility]-Cardiac & Pulmonary Rehab    Staff Present  Alberteen Sam, MA, RCEP, CCRP, Exercise Physiologist;Amanda Oletta Darter, BA, ACSM CEP, Exercise Physiologist;Carroll Enterkin, RN, BSN    Supervising physician immediately available to respond to emergencies  See telemetry face sheet for immediately available ER MD    Medication changes reported      No    Fall or balance concerns reported     No    Warm-up and Cool-down  Performed on first and last piece of equipment    Resistance Training Performed  Yes    VAD Patient?  No      Pain Assessment   Currently in Pain?  No/denies          Social History   Tobacco Use  Smoking Status Former Smoker  . Types: Cigarettes  Smokeless Tobacco Never Used  Tobacco Comment   quit 30 years ago     Goals Met:  Independence with exercise equipment Exercise tolerated well No report of cardiac concerns or symptoms Strength training completed today  Goals Unmet:  Not Applicable  Comments: Pt able to follow exercise prescription today without complaint.  Will continue to monitor for progression.    Dr. Emily Filbert is Medical Director for [place] and [facility].

## 2017-10-18 ENCOUNTER — Other Ambulatory Visit: Payer: Self-pay | Admitting: Family Medicine

## 2017-10-18 DIAGNOSIS — C61 Malignant neoplasm of prostate: Secondary | ICD-10-CM | POA: Diagnosis not present

## 2017-10-18 DIAGNOSIS — D400 Neoplasm of uncertain behavior of prostate: Secondary | ICD-10-CM | POA: Diagnosis not present

## 2017-10-18 DIAGNOSIS — I10 Essential (primary) hypertension: Secondary | ICD-10-CM

## 2017-10-19 ENCOUNTER — Encounter: Payer: Medicare Other | Attending: Internal Medicine | Admitting: *Deleted

## 2017-10-19 DIAGNOSIS — Z955 Presence of coronary angioplasty implant and graft: Secondary | ICD-10-CM | POA: Diagnosis not present

## 2017-10-19 NOTE — Progress Notes (Signed)
Daily Session Note  Patient Details  Name: Darrell [name] MRN: [id number] Date of Birth: [birth year] Referring Provider:     Cardiac Rehab from 08/26/2017 in [facility] Cardiac and Pulmonary Rehab  Referring Provider  [name]      Encounter Date: 10/19/2017  Check In: Session Check In - 10/19/17 0910      Check-In   Location  [facility]-Cardiac & Pulmonary Rehab    Staff Present  Alberteen Sam, MA, RCEP, CCRP, Exercise Physiologist;Amanda Oletta Darter, BA, ACSM CEP, Exercise Physiologist;Susanne Bice, RN, BSN, CCRP    Supervising physician immediately available to respond to emergencies  See telemetry face sheet for immediately available ER MD    Medication changes reported      No    Fall or balance concerns reported     No    Warm-up and Cool-down  Performed on first and last piece of equipment    Resistance Training Performed  Yes    VAD Patient?  No      Pain Assessment   Currently in Pain?  No/denies          Social History   Tobacco Use  Smoking Status Former Smoker  . Types: Cigarettes  Smokeless Tobacco Never Used  Tobacco Comment   quit 30 years ago     Goals Met:  Independence with exercise equipment Exercise tolerated well No report of cardiac concerns or symptoms Strength training completed today  Goals Unmet:  Not Applicable  Comments: Pt able to follow exercise prescription today without complaint.  Will continue to monitor for progression.    Dr. Emily Filbert is Medical Director for [place] and [facility].

## 2017-10-21 DIAGNOSIS — Z955 Presence of coronary angioplasty implant and graft: Secondary | ICD-10-CM | POA: Diagnosis not present

## 2017-10-21 NOTE — Progress Notes (Signed)
Daily Session Note  Patient Details  Name: Darrell [name] MRN: [id number] Date of Birth: 01/29/1946 Referring Provider:     Cardiac Rehab from 08/26/2017 in [facility] Cardiac and Pulmonary Rehab  Referring Provider  Kowalski      Encounter Date: 10/21/2017  Check In: Session Check In - 10/21/17 0838      Check-In   Location  [facility]-Cardiac & Pulmonary Rehab    Staff Present  Jessica Hawkins, MA, RCEP, CCRP, Exercise Physiologist; , BA, ACSM CEP, Exercise Physiologist;Carroll Enterkin, RN, BSN    Supervising physician immediately available to respond to emergencies  See telemetry face sheet for immediately available ER MD    Medication changes reported      No    Fall or balance concerns reported     No    Warm-up and Cool-down  Performed on first and last piece of equipment    Resistance Training Performed  Yes    VAD Patient?  No      Pain Assessment   Currently in Pain?  No/denies    Multiple Pain Sites  No          Social History   Tobacco Use  Smoking Status Former Smoker  . Types: Cigarettes  Smokeless Tobacco Never Used  Tobacco Comment   quit 30 years ago     Goals Met:  Independence with exercise equipment Exercise tolerated well No report of cardiac concerns or symptoms Strength training completed today  Goals Unmet:  Not Applicable  Comments: Pt able to follow exercise prescription today without complaint.  Will continue to monitor for progression.    Dr. Mark Miller is Medical Director for [facility] and [facility]. 

## 2017-11-02 ENCOUNTER — Encounter: Payer: Medicare Other | Admitting: *Deleted

## 2017-11-02 DIAGNOSIS — Z955 Presence of coronary angioplasty implant and graft: Secondary | ICD-10-CM | POA: Diagnosis not present

## 2017-11-02 NOTE — Progress Notes (Signed)
Daily Session Note  Patient Details  Name: Darrell [name] MRN: [id number] Date of Birth: Jan 06, 1946 Referring Provider:     Cardiac Rehab from 08/26/2017 in [facility] Cardiac and Pulmonary Rehab  Referring Provider  [name]      Encounter Date: 11/02/2017  Check In: Session Check In - 11/02/17 0910      Check-In   Location  [facility]-Cardiac & Pulmonary Rehab    Staff Present  Alberteen Sam, MA, RCEP, CCRP, Exercise Physiologist;Amanda Oletta Darter, BA, ACSM CEP, Exercise Physiologist;Susanne Bice, RN, BSN, CCRP    Supervising physician immediately available to respond to emergencies  See telemetry face sheet for immediately available ER MD    Medication changes reported      No    Fall or balance concerns reported     No    Warm-up and Cool-down  Performed on first and last piece of equipment    Resistance Training Performed  Yes    VAD Patient?  No      Pain Assessment   Currently in Pain?  No/denies          Social History   Tobacco Use  Smoking Status Former Smoker  . Types: Cigarettes  Smokeless Tobacco Never Used  Tobacco Comment   quit 30 years ago     Goals Met:  Independence with exercise equipment Exercise tolerated well Personal goals reviewed No report of cardiac concerns or symptoms Strength training completed today  Goals Unmet:  Not Applicable  Comments: Pt able to follow exercise prescription today without complaint.  Will continue to monitor for progression. See ITP for goal review   Dr. Emily Filbert is Medical Director for [place] and [facility].

## 2017-11-04 DIAGNOSIS — Z955 Presence of coronary angioplasty implant and graft: Secondary | ICD-10-CM

## 2017-11-04 NOTE — Progress Notes (Signed)
Daily Session Note  Patient Details  Name: Darrell [name] MRN: [id number] Date of Birth: [birth year] Referring Provider:     Cardiac Rehab from 08/26/2017 in [facility] Cardiac and Pulmonary Rehab  Referring Provider  [name]      Encounter Date: 11/04/2017  Check In: Session Check In - 11/04/17 1586      Check-In   Location  [facility]-Cardiac & Pulmonary Rehab    Staff Present  Alberteen Sam, MA, RCEP, CCRP, Exercise Physiologist;Marq Rebello Oletta Darter, BA, ACSM CEP, Exercise Physiologist;Carroll Enterkin, RN, BSN    Supervising physician immediately available to respond to emergencies  See telemetry face sheet for immediately available ER MD    Medication changes reported      No    Fall or balance concerns reported     No    Warm-up and Cool-down  Performed on first and last piece of equipment    Resistance Training Performed  Yes    VAD Patient?  No      Pain Assessment   Currently in Pain?  No/denies    Multiple Pain Sites  No          Social History   Tobacco Use  Smoking Status Former Smoker  . Types: Cigarettes  Smokeless Tobacco Never Used  Tobacco Comment   quit 30 years ago     Goals Met:  Independence with exercise equipment Exercise tolerated well No report of cardiac concerns or symptoms Strength training completed today  Goals Unmet:  Not Applicable  Comments: Pt able to follow exercise prescription today without complaint.  Will continue to monitor for progression.    Dr. Emily Filbert is Medical Director for [place] and [facility].

## 2017-11-09 DIAGNOSIS — Z955 Presence of coronary angioplasty implant and graft: Secondary | ICD-10-CM | POA: Diagnosis not present

## 2017-11-09 NOTE — Progress Notes (Signed)
Daily Session Note  Patient Details  Name: TALIN FEISTER MRN: [id number] Date of Birth: 1946-03-17 Referring Provider:     Cardiac Rehab from 08/26/2017 in [facility] Cardiac and Pulmonary Rehab  Referring Provider  [name]      Encounter Date: 11/09/2017  Check In: Session Check In - 11/09/17 0832      Check-In   Location  [facility]-Cardiac & Pulmonary Rehab    Staff Present  Nada Maclachlan, BA, ACSM CEP, Exercise Physiologist;Susanne Bice, RN, BSN, CCRP;Joseph Flavia Shipper    Supervising physician immediately available to respond to emergencies  See telemetry face sheet for immediately available ER MD    Medication changes reported      No    Fall or balance concerns reported     No    Warm-up and Cool-down  Performed on first and last piece of equipment    Resistance Training Performed  Yes    VAD Patient?  No      Pain Assessment   Currently in Pain?  No/denies    Multiple Pain Sites  No          Social History   Tobacco Use  Smoking Status Former Smoker  . Types: Cigarettes  Smokeless Tobacco Never Used  Tobacco Comment   quit 30 years ago     Goals Met:  Independence with exercise equipment Exercise tolerated well No report of cardiac concerns or symptoms Strength training completed today  Goals Unmet:  Not Applicable  Comments: Pt able to follow exercise prescription today without complaint.  Will continue to monitor for progression.    Dr. Emily Filbert is Medical Director for [place] and [facility].

## 2017-11-10 ENCOUNTER — Encounter: Payer: Self-pay | Admitting: *Deleted

## 2017-11-10 DIAGNOSIS — Z955 Presence of coronary angioplasty implant and graft: Secondary | ICD-10-CM

## 2017-11-10 NOTE — Progress Notes (Signed)
Cardiac Individual Treatment Plan  Patient Details  Name: Darrell [name] MRN: [id number] Date of Birth: [birth year] Referring Provider:     [name] from 08/26/2017 in [facility] Cardiac and Pulmonary [name]  Referring Provider  Darrell [name]      Initial Encounter Date:    [name] from 08/26/2017 in [facility] Cardiac and Pulmonary [name]  Date  08/26/17  Referring Provider  Darrell [name]      Visit Diagnosis: Status post coronary artery stent placement  Patient's Home Medications on Admission:  Current Outpatient Medications:  .  allopurinol (ZYLOPRIM) 300 MG tablet, Take 0.5 tablets (150 mg total) daily by mouth., Disp: 90 tablet, Rfl: 0 .  aspirin EC 81 MG EC tablet, Take 1 tablet (81 mg total) by mouth daily., Disp: , Rfl:  .  atorvastatin (LIPITOR) 80 MG tablet, Take 80 mg by mouth daily., Disp: , Rfl:  .  clopidogrel (PLAVIX) 75 MG tablet, Take 75 mg by mouth daily., Disp: , Rfl:  .  hydrALAZINE (APRESOLINE) 25 MG tablet, TAKE 1 TABLET(25 MG) BY MOUTH THREE TIMES DAILY, Disp: 270 tablet, Rfl: 0 .  hydrochlorothiazide (HYDRODIURIL) 25 MG tablet, Take 25 mg by mouth daily., Disp: , Rfl:  .  hydrochlorothiazide (HYDRODIURIL) 25 MG tablet, TAKE 1 TABLET(25 MG) BY MOUTH DAILY, Disp: 90 tablet, Rfl: 0 .  metoprolol succinate (TOPROL-XL) 25 MG 24 hr tablet, Take 25 mg by mouth daily. , Disp: , Rfl:  .  metoprolol succinate (TOPROL-XL) 25 MG 24 hr tablet, TAKE 1 TABLET BY MOUTH EVERY DAY, Disp: 90 tablet, Rfl: 0 .  naproxen (NAPROSYN) 500 MG tablet, Take 1 tablet (500 mg total) 2 (two) times daily with a meal by mouth., Disp: 10 tablet, Rfl: 0 .  nitroGLYCERIN (NITROSTAT) 0.4 MG SL tablet, Place under the tongue., Disp: , Rfl:  .  telmisartan (MICARDIS) 40 MG tablet, Take 40 mg by mouth daily., Disp: , Rfl:   Past Medical History: Past Medical History:  Diagnosis Date  . CAD (coronary artery disease)   . Cancer [facility])    prostate  . Carotid artery stenosis   . Hyperlipidemia   .  Hypertension   . Psoriasis     Tobacco Use: Social History   Tobacco Use  Smoking Status Former Smoker  . Types: Cigarettes  Smokeless Tobacco Never Used  Tobacco Comment   quit 30 years ago     Labs: Recent Review Flowsheet Data    Labs for ITP Cardiac and Pulmonary [name] Latest Ref Rng & Units 05/19/2016 11/18/2016 12/17/2016 05/20/2017 06/09/2017   Cholestrol 100 - 199 mg/dL 118 112 104 - 106   LDLCALC 0 - 99 mg/dL 44 - 39 - 42   HDL >39 mg/dL 40 - 41 - 41   Trlycerides 0 - 149 mg/dL 171(H) 264(H) 122 - 114   Hemoglobin A1c 4.8 - 5.6 % - - - 5.6 -       Exercise Target Goals:    Exercise Program Goal: Individual exercise prescription set using results from initial 6 min walk test and THRR while considering  patient's activity barriers and safety.   Exercise Prescription Goal: Initial exercise prescription builds to 30-45 minutes a day of aerobic activity, 2-3 days per week.  Home exercise guidelines will be given to patient during program as part of exercise prescription that the participant will acknowledge.  Activity Barriers & Risk Stratification:   6 Minute Walk: 6 Minute Walk    Row Name 08/26/17 1427  6 Minute Walk   Distance  1600 feet     Walk Time  6 minutes     # of Rest Breaks  0     MPH  3.03     METS  2.96     RPE  8     Perceived Dyspnea   0     VO2 Peak  10.37     Symptoms  No     Resting HR  88 bpm     Resting BP  110/64     Resting Oxygen Saturation   95 %     Max Ex. HR  103 bpm     Max Ex. BP  116/68     2 Minute Post BP  110/62        Oxygen Initial Assessment:   Oxygen Re-Evaluation:   Oxygen Discharge (Final Oxygen Re-Evaluation):   Initial Exercise Prescription: Initial Exercise Prescription - 08/26/17 1400      Date of Initial Exercise RX and Referring Provider   Date  08/26/17    Referring Provider  Darrell [name]      Treadmill   MPH  2.8    Grade  0.5    Minutes  15    METs  3.34      Recumbant Bike    Level  3    RPM  60    Watts  35    Minutes  15    METs  3      Elliptical   Level  1    Speed  3    Minutes  15      T5 Nustep   Level  2    SPM  80    Minutes  15    METs  3      Prescription Details   Frequency (times per week)  3    Duration  Progress to 30 minutes of continuous aerobic without signs/symptoms of physical distress      Intensity   THRR 40-80% of Max Heartrate  112-137    Ratings of Perceived Exertion  11-13    Perceived Dyspnea  0-4      Resistance Training   Training Prescription  Yes    Weight  5 lb    Reps  10-15       Perform Capillary Blood Glucose checks as needed.  Exercise Prescription Changes: Exercise Prescription Changes    Row Name 08/26/17 1400 09/06/17 1700 09/09/17 1000 09/22/17 1500 10/05/17 1600     Response to Exercise   Blood Pressure (Admit)  110/64  128/80  -  116/62  134/68   Blood Pressure (Exercise)  116/68  138/64  -  128/70  124/70   Blood Pressure (Exit)  110/62  114/68  -  116/64  124/60   Heart Rate (Admit)  88 bpm  56 bpm  -  88 bpm  83 bpm   Heart Rate (Exercise)  103 bpm  130 bpm  -  136 bpm  110 bpm   Heart Rate (Exit)  84 bpm  82 bpm  -  87 bpm  93 bpm   Oxygen Saturation (Admit)  95 %  -  -  -  -   Rating of Perceived Exertion (Exercise)  8  12  -  12  12   Symptoms  -  none  -  none  none   Comments  -  first full day of exercise  -  -  -  Duration  -  Continue with 30 min of aerobic exercise without signs/symptoms of physical distress.  -  Continue with 30 min of aerobic exercise without signs/symptoms of physical distress.  Continue with 30 min of aerobic exercise without signs/symptoms of physical distress.   Intensity  -  THRR unchanged  -  THRR unchanged  THRR unchanged     Progression   Progression  -  Continue to progress workloads to maintain intensity without signs/symptoms of physical distress.  -  Continue to progress workloads to maintain intensity without signs/symptoms of physical distress.   Continue to progress workloads to maintain intensity without signs/symptoms of physical distress.   Average METs  -  3.34  -  4.64  4.25     Resistance Training   Training Prescription  -  Yes  -  Yes  Yes   Weight  -  5 lb  -  7 lbs  7 lbs   Reps  -  10-15  -  10-15  10-15     Interval Training   Interval Training  -  No  -  No  No     Treadmill   MPH  -  2.8  -  3.5  3.5   Grade  -  0.5  -  2  2   Minutes  -  15  -  15  15   METs  -  3.34  -  4.64  4.64     Recumbant Bike   Level  -  -  -  -  6   Watts  -  -  -  -  57   Minutes  -  -  -  -  15   METs  -  -  -  -  3.89     Elliptical   Level  -  1  -  1  1   Speed  -  3  -  3  3   Minutes  -  15  -  15  15     Home Exercise Plan   Plans to continue exercise at  -  -  [employer] (comment) [employer] (comment) [employer] (comment) [employer]   Frequency  -  -  Add 3 additional days to program exercise sessions.  Add 3 additional days to program exercise sessions.  Add 3 additional days to program exercise sessions.   Initial Home Exercises Provided  -  -  09/09/17  09/09/17  09/09/17   Row Name 10/19/17 1600 11/02/17 1500           Response to Exercise   Blood Pressure (Admit)  134/66  130/80      Blood Pressure (Exercise)  156/74  122/70      Blood Pressure (Exit)  124/72  126/74      Heart Rate (Admit)  69 bpm  90 bpm      Heart Rate (Exercise)  110 bpm  127 bpm      Heart Rate (Exit)  64 bpm  74 bpm      Rating of Perceived Exertion (Exercise)  13  12      Symptoms  none  none      Duration  Continue with 30 min of aerobic exercise without signs/symptoms of physical distress.  Continue with 30 min of aerobic exercise without signs/symptoms of physical distress.      Intensity  THRR  unchanged  THRR unchanged        Progression   Progression  Continue to progress workloads to maintain intensity without signs/symptoms of physical distress.  Continue to  progress workloads to maintain intensity without signs/symptoms of physical distress.      Average METs  4.27  4.64        Resistance Training   Training Prescription  Yes  Yes      Weight  7 lbs  7 lbs      Reps  10-15  10-15        Interval Training   Interval Training  No  No        Treadmill   MPH  3.5  3.5      Grade  2  2      Minutes  15  15      METs  4.64  4.64        Recumbant Bike   Level  6  -      Watts  51  -      Minutes  15  -      METs  3.89  -        Elliptical   Level  -  1      Speed  -  2.5      Minutes  -  15        Home Exercise Plan   Plans to continue exercise at  [employer] (comment) [employer] (comment) [employer]      Frequency  Add 3 additional days to program exercise sessions.  Add 3 additional days to program exercise sessions.      Initial Home Exercises Provided  09/09/17  09/09/17         Exercise Comments: Exercise Comments    Row Name 08/31/17 0846           Exercise Comments   First full day of exercise!  Patient was oriented to gym and equipment including functions, settings, policies, and procedures.  Patient's individual exercise prescription and treatment plan were reviewed.  All starting workloads were established based on the results of the 6 minute walk test done at initial orientation visit.  The plan for exercise progression was also introduced and progression will be customized based on patient's performance and goals.          Exercise Goals and Review: Exercise Goals    Row Name 08/26/17 1426             Exercise Goals   Increase Physical Activity  Yes       Intervention  Provide advice, education, support and counseling about physical activity/exercise needs.;Develop an individualized exercise prescription for aerobic and resistive training based on initial evaluation findings, risk stratification, comorbidities and participant's personal goals.       Expected Outcomes   Achievement of increased cardiorespiratory fitness and enhanced flexibility, muscular endurance and strength shown through measurements of functional capacity and personal statement of participant.       Increase Strength and Stamina  Yes       Intervention  Provide advice, education, support and counseling about physical activity/exercise needs.;Develop an individualized exercise prescription for aerobic and resistive training based on initial evaluation findings, risk stratification, comorbidities and participant's personal goals.       Expected Outcomes  Achievement of increased cardiorespiratory fitness and enhanced flexibility, muscular endurance and strength shown through measurements of functional capacity and personal statement  of participant.       Able to understand and use rate of perceived exertion (RPE) scale  Yes       Intervention  Provide education and explanation on how to use RPE scale       Expected Outcomes  Short Term: Able to use RPE daily in [name] to express subjective intensity level;Long Term:  Able to use RPE to guide intensity level when exercising independently       Able to understand and use Dyspnea scale  Yes       Intervention  Provide education and explanation on how to use Dyspnea scale       Expected Outcomes  Short Term: Able to use Dyspnea scale daily in [name] to express subjective sense of shortness of breath during exertion;Long Term: Able to use Dyspnea scale to guide intensity level when exercising independently       Knowledge and understanding of Target Heart Rate Range (THRR)  Yes       Intervention  Provide education and explanation of THRR including how the numbers were predicted and where they are located for reference       Expected Outcomes  Short Term: Able to state/look up THRR;Long Term: Able to use THRR to govern intensity when exercising independently;Short Term: Able to use daily as guideline for intensity in [name]       Able to check pulse  independently  Yes       Intervention  Provide education and demonstration on how to check pulse in carotid and radial arteries.;Review the importance of being able to check your own pulse for safety during independent exercise       Expected Outcomes  Short Term: Able to explain why pulse checking is important during independent exercise;Long Term: Able to check pulse independently and accurately       Understanding of Exercise Prescription  Yes       Intervention  Provide education, explanation, and written materials on patient's individual exercise prescription       Expected Outcomes  Short Term: Able to explain program exercise prescription;Long Term: Able to explain home exercise prescription to exercise independently          Exercise Goals Re-Evaluation : Exercise Goals Re-Evaluation    Row Name 08/31/17 0846 09/06/17 1704 09/09/17 1050 09/22/17 1527 09/30/17 0903     Exercise Goal Re-Evaluation   Exercise Goals Review  Increase Physical Activity;Increase Strength and Stamina;Able to understand and use rate of perceived exertion (RPE) scale;Knowledge and understanding of Target Heart Rate Range (THRR);Understanding of Exercise Prescription  Increase Physical Activity;Increase Strength and Stamina;Understanding of Exercise Prescription  Understanding of Exercise Prescription;Knowledge and understanding of Target Heart Rate Range (THRR);Able to understand and use rate of perceived exertion (RPE) scale;Able to check pulse independently  Increase Physical Activity;Understanding of Exercise Prescription;Increase Strength and Stamina  Increase Physical Activity;Understanding of Exercise Prescription;Increase Strength and Stamina   Comments  Reviewed RPE scale, THR and program prescription with pt today.  Pt voiced understanding and was given a copy of goals to take home.   Darrell [name] is off to a good start in [name].  He has completed his first full day of exercise.  We will continue to monitor his  progression.   Reviewed home exercise with pt today.  Pt plans to walk and go to [employer] for exercise.  Reviewed THR, pulse, RPE, sign and symptoms, NTG use, and when to call 911 or MD.  Also discussed weather considerations and  indoor options.  Pt voiced understanding.  Darrell [name] has been doing well in [name].  He is going to [employer] and is already up to 3.5 mph on the treadmill.  We will continue to monitor his progression.   Darrell [name] has been going to [employer] 4-5 days a week and does weights and cardio.  He is not warming up first.  He is feeling stronger and has more stamina.    Expected Outcomes  Short: Use RPE daily to regulate intensity.  Long: Follow program prescription in THR.  Short: Attend [name] regularly.  Long: Continue to follow program prescription.   Short: Return to [employer] Long: Exericse regularly.   Short: Continue to build workload on ellipitical.  Long: Continue to exericse more on off days.   Short: Add warm-up to routine.  Long: Continue to exercise on off days.    [place] Name 10/05/17 1621 10/19/17 1607 11/02/17 0910         Exercise Goal Re-Evaluation   Exercise Goals Review  Increase Physical Activity;Understanding of Exercise Prescription;Increase Strength and Stamina  Increase Physical Activity;Understanding of Exercise Prescription;Increase Strength and Stamina  Increase Physical Activity;Understanding of Exercise Prescription;Increase Strength and Stamina     Comments  Darrell [name].  This week he is visiting his granddaughter in [place].  He is now up to level 6 on the bike.  We will continue to monitor his progression.   Darrell [name] has been doing well in [name].  He is now up to 51 watts on the recumbent bike.  We will continue to monitor his progression.   Darrell [name].  He tries to get to the gym, but last week his schedule was full and he did not go.  He knows he needs to exercise and it helps him feel better. Overall  he is feelilng good.      Expected Outcomes  Short: Add warm up into routine at home.  Long: Continue to exercise daily for stress relief.  Short: Continue to exercise more at home on off days.  Long: Continue to use exercise for stress relief.   Short: Make time to go to gym!  Long: Continue to exercise for stress relief        Discharge Exercise Prescription (Final Exercise Prescription Changes): Exercise Prescription Changes - 11/02/17 1500      Response to Exercise   Blood Pressure (Admit)  130/80    Blood Pressure (Exercise)  122/70    Blood Pressure (Exit)  126/74    Heart Rate (Admit)  90 bpm    Heart Rate (Exercise)  127 bpm    Heart Rate (Exit)  74 bpm    Rating of Perceived Exertion (Exercise)  12    Symptoms  none    Duration  Continue with 30 min of aerobic exercise without signs/symptoms of physical distress.    Intensity  THRR unchanged      Progression   Progression  Continue to progress workloads to maintain intensity without signs/symptoms of physical distress.    Average METs  4.64      Resistance Training   Training Prescription  Yes    Weight  7 lbs    Reps  10-15      Interval Training   Interval Training  No      Treadmill   MPH  3.5    Grade  2    Minutes  15    METs  4.64      Elliptical   Level  1    Speed  2.5    Minutes  15      Home Exercise Plan   Plans to continue exercise at  [employer] (comment) [employer]    Frequency  Add 3 additional days to program exercise sessions.    Initial Home Exercises Provided  09/09/17       Nutrition:  Target Goals: Understanding of nutrition guidelines, daily intake of sodium <1564m, cholesterol <2080m calories 30% from fat and 7% or less from saturated fats, daily to have 5 or more servings of fruits and vegetables.  Biometrics: Pre Biometrics - 08/26/17 1425      Pre Biometrics   Height  5' 7.5" (1.715 m)    Weight  223 lb 14.4 oz (101.6 kg)    Waist Circumference  44 inches     Hip Circumference  45 inches    Waist to Hip Ratio  0.98 %    BMI (Calculated)  34.53    Single Leg Stand  30 seconds        Nutrition Therapy Plan and Nutrition Goals: Nutrition Therapy & Goals - 09/08/17 1535      Nutrition Therapy   Diet  TLC    Protein (specify units)  1-4oz each meal    Saturated Fats  15 max. grams    Fruits and Vegetables  5 servings/day    Sodium  1500 grams      Personal Nutrition Goals   Personal Goal #2  Continue to work on gradual changes to control food portions and make heart healthy (low saturated fat, high fiber/veg/fruit) food choices.     Additional Goals?  No    Comments  Darrell [name] feels he is doing well with diet, other than some struggle to lose weight. He has reduced food portions and plans to continue working to control caloric intake.        Nutrition Assessments: Nutrition Assessments - 08/26/17 1248      MEDFICTS Scores   Pre Score  40       Nutrition Goals Re-Evaluation: Nutrition Goals Re-Evaluation    Row Name 09/07/17 0911 09/30/17 0908 11/02/17 0916         Goals   Nutrition Goal  Meet with nurtitionist  Heart healty choices, more fruits and vegetables, less fried foods, continue with portion control  Heart healty choices, more fruits and vegetables, less fried foods, continue with portion control, portion control     Comment  made appointment to meet with dietician tomorrow afternoon.  Darrell Sides dong better with portion control.  He is eating more fruits and vegetables.  He is still struggling with fried foods as he enjoys eating.    Darrell Sides doing better with his nutrition.  He went to [employer] last week and actually got the baked chicken!!  He also avoided the bread, potatoes, and desserts!  He was quite proud of himself.    He is trying to stay away from the fired foods.  He has also continued to work on his portion control!     Expected Outcome  Short: Meet with nutritionist  Long: Follow recommendations.  Short: Cut  back on fried foods.  Long: Continue to follow recommendations and heart healthy diet.   Short: Continue to trade out fried foods.  Long: Continue to work on heart healthy diet.         Nutrition Goals Discharge (Final Nutrition Goals  Re-Evaluation): Nutrition Goals Re-Evaluation - 11/02/17 0916      Goals   Nutrition Goal  Heart healty choices, more fruits and vegetables, less fried foods, continue with portion control, portion control    Comment  Darrell [name] is doing better with his nutrition.  He went to [employer] last week and actually got the baked chicken!!  He also avoided the bread, potatoes, and desserts!  He was quite proud of himself.    He is trying to stay away from the fired foods.  He has also continued to work on his portion control!    Expected Outcome  Short: Continue to trade out fried foods.  Long: Continue to work on heart healthy diet.        Psychosocial: Target Goals: Acknowledge presence or absence of significant depression and/or stress, maximize coping skills, provide positive support system. Participant is able to verbalize types and ability to use techniques and skills needed for reducing stress and depression.   Initial Review & Psychosocial Screening: Initial Psych Review & Screening - 08/26/17 1242      Initial Review   Current issues with  Current Anxiety/Panic;Current Sleep Concerns;Current Stress Concerns    Source of Stress Concerns  Occupation;Unable to perform yard/household activities    Comments  Due to age and chronic illness unable to do all he wants to do for activities      [place]?  Yes spouse      Barriers   Psychosocial barriers to participate in program  There are no identifiable barriers or psychosocial needs.;The patient should benefit from training in stress management and relaxation.      Screening Interventions   Interventions  Encouraged to exercise;Provide feedback about the scores to participant;To  provide support and resources with identified psychosocial needs;Yes    Expected Outcomes  Short Term goal: Utilizing psychosocial counselor, staff and physician to assist with identification of specific Stressors or current issues interfering with healing process. Setting desired goal for each stressor or current issue identified.;Long Term Goal: Stressors or current issues are controlled or eliminated.;Short Term goal: Identification and review with participant of any Quality of Life or Depression concerns found by scoring the questionnaire.;Long Term goal: The participant improves quality of Life and PHQ9 Scores as seen by post scores and/or verbalization of changes       Quality of Life Scores:  Quality of Life - 08/26/17 1247      Quality of Life Scores   Health/Function Pre  16.27 %    Socioeconomic Pre  20.38 %    Psych/Spiritual Pre  17.64 %    Family Pre  24 %    GLOBAL Pre  18.59 %      Scores of 19 and below usually indicate a poorer quality of life in these areas.  A difference of  2-3 points is a clinically meaningful difference.  A difference of 2-3 points in the total score of the Quality of Life Index has been associated with significant improvement in overall quality of life, self-image, physical symptoms, and general health in studies assessing change in quality of life.  PHQ-9: Recent Review Flowsheet Data    Depression screen [facility] 2/9 08/26/2017 06/03/2017 11/18/2016 05/19/2016 05/09/2015   Decreased Interest 1 0 0 0 0   Down, Depressed, Hopeless 1 0 0 0 0   PHQ - 2 Score 2 0 0 0 0   Altered sleeping 2 - - - -   Tired, decreased energy  1 - - - -   Change in appetite 0 - - - -   Feeling bad or failure about yourself  0 - - - -   Trouble concentrating 0 - - - -   Moving slowly or fidgety/restless 0 - - - -   Suicidal thoughts 0 - - - -   PHQ-9 Score 5 - - - -   Difficult doing work/chores Not difficult at all - - - -     Interpretation of Total Score  Total Score  Depression Severity:  1-4 = Minimal depression, 5-9 = Mild depression, 10-14 = Moderate depression, 15-19 = Moderately severe depression, 20-27 = Severe depression   Psychosocial Evaluation and Intervention: Psychosocial Evaluation - 08/31/17 0930      Psychosocial Evaluation & Interventions   Interventions  Encouraged to exercise with the program and follow exercise prescription;Relaxation education;Stress management education    Comments  Counselor met with Mr. Van Darrell [name]) today for initial psychosocial evaluation.  He is a 72 year old who had a heart cath and (3) stents inserted recently for blockages.  He has a strong support system with a spouse of 23 years; a host of nieces and nephews and a sister who lives locally.  Darrell [name] is also actively involved in his local church community.  He reports sleeping "okay" although he has sleep apnea and indicated the CPAP "did not work" for him.  His appetite is "too good."  He denies a history of depression or anxiety or any current symptoms.  Darrell [name] states his mood is typically positive most of the time.  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  He has goals to "feel better about exercising" since his procedure; and to get a baseline to know how to exercise on his own with confidence.  Staff will be following with Darrell [name] throughout the course of this program.      Expected Outcomes  Darrell [name] will benefit from consistent exercise to achieve his stated goals.  The educational and psychoeducational components of this program will be helpful in understanding his condition and learning positive ways to cope with the stress in his life.      Continue Psychosocial Services   Follow up required by staff       Psychosocial Re-Evaluation: Psychosocial Re-Evaluation    [place] Name 09/30/17 0910 11/02/17 0932           Psychosocial Re-Evaluation   Current issues with  Current Stress Concerns  Current Stress  Concerns      Comments  Darrell [name] is going to visit his grandson next week in [place] for his second birthday.  Darrell [name] continues to be his biggest stressor.  He is looking forward to having time off next week.  They can't even go out to eat without people coming up to visit with him.  His wife is very supportive of him.    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  However, he continues to sleep well and is coping well overall. He knows going to the gym helps him regulate his stress levels.        Expected Outcomes  Short: Enjoy trip to [place]: Continue to cope well with stressors.   Short: Continue to exercise to reduce stress.  Long: Continue to cope with [name].       Interventions  Encouraged to  attend Cardiac Rehabilitation for the exercise;Stress management education  Encouraged to attend Cardiac Rehabilitation for the exercise;Stress management education      Continue Psychosocial Services   Follow up required by staff  Follow up required by staff        Initial Review   Source of Stress Concerns  Occupation;Unable to perform yard/household activities  Occupation;Unable to perform yard/household activities         Psychosocial Discharge (Final Psychosocial Re-Evaluation): Psychosocial Re-Evaluation - 11/02/17 0918      Psychosocial Re-Evaluation   Current issues with  Current Stress Concerns    Comments  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  However, he continues to sleep well and is coping well overall. He knows going to the gym helps him regulate his stress levels.      Expected Outcomes  Short: Continue to exercise to reduce stress.  Long: Continue to cope with [name].     Interventions  Encouraged to attend Cardiac Rehabilitation for the exercise;Stress management education    Continue Psychosocial Services   Follow up required by staff      Initial Review   Source of Stress Concerns   Occupation;Unable to perform yard/household activities       Vocational Rehabilitation: Provide vocational [name] assistance to qualifying candidates.   Vocational [name] Evaluation & Intervention: Vocational [name] - 08/26/17 1244      Initial Vocational [name] Evaluation & Intervention   Assessment shows need for Vocational Rehabilitation  No       Education: Education Goals: Education classes will be provided on a variety of topics geared toward better understanding of heart health and risk factor modification. Participant will state understanding/return demonstration of topics presented as noted by education test scores.  Learning Barriers/Preferences: Learning Barriers/Preferences - 08/26/17 1244      Learning Barriers/Preferences   Learning Barriers  None    Learning Preferences  Individual Instruction       Education Topics:  AED/CPR: - Group verbal and written instruction with the use of models to demonstrate the basic use of the AED with the basic ABC's of resuscitation.   General Nutrition Guidelines/Fats and Fiber: -Group instruction provided by verbal, written material, models and posters to present the general guidelines for heart healthy nutrition. Gives an explanation and review of dietary fats and fiber.   [name] from 11/09/2017 in [facility] Cardiac and Pulmonary [name]  Date  11/09/17  Educator  PI  Instruction Review Code  1- Verbalizes Understanding      Controlling Sodium/Reading Food Labels: -Group verbal and written material supporting the discussion of sodium use in heart healthy nutrition. Review and explanation with models, verbal and written materials for utilization of the food label.   [name] from 11/09/2017 in [facility] Cardiac and Pulmonary [name]  Date  09/21/17  Educator  CR  Instruction Review Code  1- Verbalizes Understanding      Exercise Physiology & General Exercise Guidelines: - Group verbal and written instruction with models to  review the exercise physiology of the cardiovascular system and associated critical values. Provides general exercise guidelines with specific guidelines to those with heart or lung disease.    Aerobic Exercise & Resistance Training: - Gives group verbal and written instruction on the various components of exercise. Focuses on aerobic and resistive training programs and the benefits of this training and how to safely progress through these programs..   Flexibility, Balance, Mind/Body Relaxation: Provides  group verbal/written instruction on the benefits of flexibility and balance training, including mind/body exercise modes such as yoga, pilates and tai chi.  Demonstration and skill practice provided.   [name] from 11/09/2017 in [facility] Cardiac and Pulmonary [name]  Date  10/14/17  Educator  AS  Instruction Review Code  1- Verbalizes Understanding      Stress and Anxiety: - Provides group verbal and written instruction about the health risks of elevated stress and causes of high stress.  Discuss the correlation between heart/lung disease and anxiety and treatment options. Review healthy ways to manage with stress and anxiety.   [name] from 11/09/2017 in [facility] Cardiac and Pulmonary [name]  Date  10/19/17  Educator  [facility]  Instruction Review Code  1- Verbalizes Understanding      Depression: - Provides group verbal and written instruction on the correlation between heart/lung disease and depressed mood, treatment options, and the stigmas associated with seeking treatment.   Anatomy & Physiology of the Heart: - Group verbal and written instruction and models provide basic cardiac anatomy and physiology, with the coronary electrical and arterial systems. Review of Valvular disease and Heart Failure   [name] from 11/09/2017 in [facility] Cardiac and Pulmonary [name]  Date  11/04/17  Educator  CE  Instruction Review Code  1- Verbalizes Understanding      Cardiac Procedures: - Group  verbal and written instruction to review commonly prescribed medications for heart disease. Reviews the medication, class of the drug, and [name] effects. Includes the steps to properly store meds and maintain the prescription regimen. (beta blockers and nitrates)   [name] from 11/09/2017 in [facility] Cardiac and Pulmonary [name]  Date  09/23/17  Educator  CE  Instruction Review Code  1- Verbalizes Understanding      Cardiac Medications I: - Group verbal and written instruction to review commonly prescribed medications for heart disease. Reviews the medication, class of the drug, and [name] effects. Includes the steps to properly store meds and maintain the prescription regimen.   [name] from 11/09/2017 in [facility] Cardiac and Pulmonary [name]  Date  08/31/17  Educator  SB  Instruction Review Code  1- Verbalizes Understanding      Cardiac Medications II: -Group verbal and written instruction to review commonly prescribed medications for heart disease. Reviews the medication, class of the drug, and [name] effects. (all other drug classes)   [name] from 11/09/2017 in [facility] Cardiac and Pulmonary [name]  Date  10/21/17  Educator  [facility]  Instruction Review Code  1- Verbalizes Understanding       Go Sex-Intimacy & Heart Disease, Get SMART - Goal Setting: - Group verbal and written instruction through game format to discuss heart disease and the return to sexual intimacy. Provides group verbal and written material to discuss and apply goal setting through the application of the S.M.A.R.T. Method.   [name] from 11/09/2017 in [facility] Cardiac and Pulmonary [name]  Date  09/23/17  Educator  CE  Instruction Review Code  1- Verbalizes Understanding      Other Matters of the Heart: - Provides group verbal, written materials and models to describe Stable Angina and Peripheral Artery. Includes description of the disease process and treatment options available to the cardiac patient.   Cardiac  [name] from 11/09/2017 in [facility] Cardiac and Pulmonary [name]  Date  09/09/17  Educator  CE  Instruction Review Code  1- Verbalizes Understanding      Exercise & Equipment Safety: - Individual verbal  instruction and demonstration of equipment use and safety with use of the equipment.   [name] from 11/09/2017 in [name] Cardiac and Pulmonary [name]  Date  08/26/17  Educator  Sb  Instruction Review Code  1- Verbalizes Understanding      Infection Prevention: - Provides verbal and written material to individual with discussion of infection control including proper hand washing and proper equipment cleaning during exercise session.   [name] from 11/09/2017 in [facility] Cardiac and Pulmonary [name]  Date  08/26/17  Educator  Sb  Instruction Review Code  1- Verbalizes Understanding      Falls Prevention: - Provides verbal and written material to individual with discussion of falls prevention and safety.   [name] from 11/09/2017 in [facility] Cardiac and Pulmonary [name]  Date  08/26/17  Educator  Sb  Instruction Review Code  1- Verbalizes Understanding      Diabetes: - Individual verbal and written instruction to review signs/symptoms of diabetes, desired ranges of glucose level fasting, after meals and with exercise. Acknowledge that pre and post exercise glucose checks will be done for 3 sessions at entry of program.   Know Your Numbers and Risk Factors: -Group verbal and written instruction about important numbers in your health.  Discussion of what are risk factors and how they play a role in the disease process.  Review of Cholesterol, Blood Pressure, Diabetes, and BMI and the role they play in your overall health.   [name] from 11/09/2017 in [facility] Cardiac and Pulmonary [name]  Date  10/21/17  Educator  [facility]  Instruction Review Code  1- Verbalizes Understanding      Sleep Hygiene: -Provides group verbal and written instruction about how sleep can affect your health.   Define sleep hygiene, discuss sleep cycles and impact of sleep habits. Review good sleep hygiene tips.    [name] from 11/09/2017 in [facility] Cardiac and Pulmonary [name]  Date  11/02/17  Educator  [facility]  Instruction Review Code  1- Verbalizes Understanding      Other: -Provides group and verbal instruction on various topics (see comments)   Knowledge Questionnaire Score: Knowledge Questionnaire Score - 08/26/17 1244      Knowledge Questionnaire Score   Pre Score  24/28       Core Components/Risk Factors/Patient Goals at Admission:   Core Components/Risk Factors/Patient Goals Review:  Goals and Risk Factor Review    Row Name 09/30/17 0904 11/02/17 0913           Core Components/Risk Factors/Patient Goals Review   Personal Goals Review  Weight Management/Obesity;Hypertension;Lipids  Weight Management/Obesity;Hypertension;Lipids      Review  Darrell [name] have been good.  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  His weight is up higher than  he wants as he is still not eating perfectly. He has still been eating some fried foods but trying to get better.  We talked about adding intervals to help boosting weight loss.   Medications seem to working for now.  He has follow up appointment with cards but they did not do blood work.   Darrell [name].  His weight stays between 227-230 lbs.  He would like to lose more, but continues to eat some and has not been at the gym as much. He is going to try to get to gym and continue to eat better.  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  Lipids are doing well and his meds are working for him.       Expected Outcomes  Short: Add in intervals to help with weight loss.  Long: Continue to work on risk factor modifications.   Short: Get back to gym to work on weight loss.  Long: Continue to monitor risk factors.          Core Components/Risk Factors/Patient Goals at  Discharge (Final Review):  Goals and Risk Factor Review - 11/02/17 0913      Core Components/Risk Factors/Patient Goals Review   Personal Goals Review  Weight Management/Obesity;Hypertension;Lipids    Review  Darrell [name].  His weight stays between 227-230 lbs.  He would like to lose more, but continues to eat some and has not been at the gym as much. He is going to try to get to gym and continue to eat better.  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  Lipids are doing well and his meds are working for him.     Expected Outcomes  Short: Get back to gym to work on weight loss.  Long: Continue to monitor risk factors.        ITP Comments: ITP Comments    Row Name 08/26/17 1250 09/15/17 0620 09/23/17 1035 10/13/17 0627 11/10/17 0630   ITP Comments  Medical review comlpeted  ITP sent to Dr Elta Guadeloupe Sabra Heck for review, changes as needed and signature.  Diagnosis documentatincan be found in [facility] Visit 05/27/2017  30 Day review. Continue with ITP unless directed changes per Medical Director review.   New to program  Multiple PVC started when he was using small hand weights in [name]. Rosie said he didn't feel PVCs but did feel a little light headed. Blood pressure was 104/58 given 360 cc of water. Blood pressure came up to 118/70. Will fax PVC's to MD.   30 day review. Continue with ITP unless directed changes per Medical Director review. only 3 visits this month  30 Day review. Continue with ITP unless directed changes per Medical Director review.        Comments:

## 2017-11-11 DIAGNOSIS — Z955 Presence of coronary angioplasty implant and graft: Secondary | ICD-10-CM

## 2017-11-11 NOTE — Progress Notes (Signed)
Daily Session Note  Patient Details  Name: Darrell [name] MRN: [id number] Date of Birth: May 31, 1946 Referring Provider:     Cardiac Rehab from 08/26/2017 in [facility] Cardiac and Pulmonary Rehab  Referring Provider  [name]      Encounter Date: 11/11/2017  Check In: Session Check In - 11/11/17 0842      Check-In   Location  [facility]-Cardiac & Pulmonary Rehab    Staff Present  Alberteen Sam, MA, RCEP, CCRP, Exercise Physiologist;Amanda Oletta Darter, BA, ACSM CEP, Exercise Physiologist;Carroll Enterkin, RN, BSN    Supervising physician immediately available to respond to emergencies  See telemetry face sheet for immediately available ER MD    Medication changes reported      No    Fall or balance concerns reported     No    Warm-up and Cool-down  Performed on first and last piece of equipment    Resistance Training Performed  Yes    VAD Patient?  No      Pain Assessment   Currently in Pain?  No/denies    Multiple Pain Sites  No          Social History   Tobacco Use  Smoking Status Former Smoker  . Types: Cigarettes  Smokeless Tobacco Never Used  Tobacco Comment   quit 30 years ago     Goals Met:  Independence with exercise equipment Exercise tolerated well No report of cardiac concerns or symptoms Strength training completed today  Goals Unmet:  Not Applicable  Comments: Pt able to follow exercise prescription today without complaint.  Will continue to monitor for progression.    Dr. Emily Filbert is Medical Director for [place] and [facility].

## 2017-11-12 ENCOUNTER — Inpatient Hospital Stay: Payer: Medicare Other | Attending: Oncology

## 2017-11-12 VITALS — BP 112/72 | HR 85 | Temp 97.7°F | Resp 20

## 2017-11-12 DIAGNOSIS — L4050 Arthropathic psoriasis, unspecified: Secondary | ICD-10-CM | POA: Diagnosis not present

## 2017-11-12 DIAGNOSIS — Z79899 Other long term (current) drug therapy: Secondary | ICD-10-CM | POA: Insufficient documentation

## 2017-11-12 MED ORDER — TILDRAKIZUMAB-ASMN 100 MG/ML ~~LOC~~ SOSY
100.0000 mg | PREFILLED_SYRINGE | Freq: Once | SUBCUTANEOUS | Status: AC
  Administered 2017-11-12: 100 mg via SUBCUTANEOUS
  Filled 2017-11-12: qty 1

## 2017-11-14 ENCOUNTER — Other Ambulatory Visit: Payer: Self-pay | Admitting: Unknown Physician Specialty

## 2017-11-14 DIAGNOSIS — I10 Essential (primary) hypertension: Secondary | ICD-10-CM

## 2017-11-16 ENCOUNTER — Encounter: Payer: Medicare Other | Attending: Internal Medicine | Admitting: *Deleted

## 2017-11-16 ENCOUNTER — Telehealth: Payer: Self-pay | Admitting: Family Medicine

## 2017-11-16 DIAGNOSIS — Z955 Presence of coronary angioplasty implant and graft: Secondary | ICD-10-CM | POA: Diagnosis not present

## 2017-11-16 NOTE — Telephone Encounter (Signed)
Needs an appointment. Has not been seen in a year

## 2017-11-16 NOTE — Telephone Encounter (Signed)
Copied from [place] [phone]. Topic: Quick Communication - Rx Refill/Question >> Nov 16, 2017 10:39 AM Cecelia Byars, NT wrote: Medication:  hydrochlorothiazide (HYDRODIURIL) 25 MG tablet  Has the patient contacted their pharmacy? {yes  (Agent: If no, request that the patient contact the pharmacy for the refill Preferred Pharmacy (with phone number or street name):[facility], [address] [phone] (Phone) [phone] (Fax Agent: Please be advised that RX refills may take up to 3 business days. We ask that you follow-up with your pharmacy. The pharmacy told the patient they need prior authorization for the above medication sent to them before filling the prescription

## 2017-11-16 NOTE — Telephone Encounter (Signed)
Pharmacy request for prior authorization.

## 2017-11-16 NOTE — Progress Notes (Signed)
Daily Session Note  Patient Details  Name: Darrell [name] MRN: [id number] Date of Birth: [birth year] Referring Provider:     Cardiac Rehab from 08/26/2017 in [facility] Cardiac and Pulmonary Rehab  Referring Provider  [name]      Encounter Date: 11/16/2017  Check In: Session Check In - 11/16/17 0838      Check-In   Location  [facility]-Cardiac & Pulmonary Rehab    Staff Present  Heath Lark, RN, BSN, CCRP;Mathilda Maguire [place], MA, RCEP, CCRP, Exercise Physiologist;Amanda Oletta Darter, [place], ACSM CEP, Exercise Physiologist    Supervising physician immediately available to respond to emergencies  See telemetry face sheet for immediately available ER MD    Medication changes reported      No    Fall or balance concerns reported     No    Warm-up and Cool-down  Performed on first and last piece of equipment    Resistance Training Performed  Yes    VAD Patient?  No      Pain Assessment   Currently in Pain?  No/denies          Social History   Tobacco Use  Smoking Status Former Smoker  . Types: Cigarettes  Smokeless Tobacco Never Used  Tobacco Comment   quit 30 years ago     Goals Met:  Independence with exercise equipment Exercise tolerated well No report of cardiac concerns or symptoms Strength training completed today  Goals Unmet:  Not Applicable  Comments: Pt able to follow exercise prescription today without complaint.  Will continue to monitor for progression.    Dr. Emily Filbert is Medical Director for [place] and [facility].

## 2017-11-18 NOTE — Telephone Encounter (Signed)
LVM to call back and schedule an appt

## 2017-11-22 ENCOUNTER — Ambulatory Visit (INDEPENDENT_AMBULATORY_CARE_PROVIDER_SITE_OTHER): Payer: Medicare Other | Admitting: Family Medicine

## 2017-11-22 ENCOUNTER — Encounter: Payer: Self-pay | Admitting: Family Medicine

## 2017-11-22 VITALS — BP 163/79 | HR 61 | Temp 98.3°F | Wt 235.5 lb

## 2017-11-22 DIAGNOSIS — I25119 Atherosclerotic heart disease of native coronary artery with unspecified angina pectoris: Secondary | ICD-10-CM

## 2017-11-22 DIAGNOSIS — I10 Essential (primary) hypertension: Secondary | ICD-10-CM | POA: Diagnosis not present

## 2017-11-22 DIAGNOSIS — I25118 Atherosclerotic heart disease of native coronary artery with other forms of angina pectoris: Secondary | ICD-10-CM | POA: Diagnosis not present

## 2017-11-22 MED ORDER — HYDRALAZINE HCL 25 MG PO TABS: 25.0000 mg | ORAL_TABLET | Freq: Two times a day (BID) | ORAL | 1 refills | Status: DC

## 2017-11-22 MED ORDER — HYDROCHLOROTHIAZIDE 25 MG PO TABS: 25.0000 mg | ORAL_TABLET | Freq: Every day | ORAL | 1 refills | Status: DC

## 2017-11-22 NOTE — Assessment & Plan Note (Signed)
Certain about progressive coronary artery disease will refer back to [facility]

## 2017-11-22 NOTE — Assessment & Plan Note (Addendum)
Discuss hypertension poor control will restart hydralazine 25 mg twice a day and observe control issues.

## 2017-11-22 NOTE — Progress Notes (Signed)
BP (!) 163/79 (BP Location: Left Arm, Patient Position: Sitting, Cuff Size: Large)   Pulse 61   Temp 98.3 F (36.8 C)   Wt 235 lb 8 oz (106.8 kg)   SpO2 97%   BMI 36.34 kg/m    Subjective:    Patient ID: Darrell [name], male    DOB: 21-Apr-1946, 72 y.o.   MRN: [id number]  HPI: Darrell [name] is a 72 y.o. male  Chief Complaint  Patient presents with  . Hypertension   Patient recheck hypertension has been out of hydralazine a couple days has cut from 3 a day to 2 a day as blood pressure was getting too low with cardiac rehab and patient was feeling low blood pressure symptoms.  Now out of hydralazine blood pressures too high today.  Of concern patient's developed worsening shortness of breath with minimal exertion.  This is similar to previous coronary artery disease symptoms he felt previously. Reviewed cardiology notes from [place] and showing 3 stents placed for extensive coronary artery disease with intention of high dose statin. Patient's been able to comply with this had cardiology appointment in January with follow-up in May.  Relevant past medical, surgical, family and social history reviewed and updated as indicated. Interim medical history since our last visit reviewed. Allergies and medications reviewed and updated.  Review of Systems  Constitutional: Negative.   Respiratory: Positive for chest tightness and shortness of breath.   Cardiovascular: Negative.     Per HPI unless specifically indicated above     Objective:    BP (!) 163/79 (BP Location: Left Arm, Patient Position: Sitting, Cuff Size: Large)   Pulse 61   Temp 98.3 F (36.8 C)   Wt 235 lb 8 oz (106.8 kg)   SpO2 97%   BMI 36.34 kg/m   Wt Readings from Last 3 Encounters:  11/22/17 235 lb 8 oz (106.8 kg)  09/23/17 228 lb (103.4 kg)  08/26/17 223 lb 14.4 oz (101.6 kg)    Physical Exam  Constitutional: He is oriented to person, place, and time. He appears well-developed and well-nourished.    HENT:  Head: Normocephalic and atraumatic.  Eyes: Conjunctivae and EOM are normal.  Neck: Normal range of motion.  Cardiovascular: Normal rate, regular rhythm and normal heart sounds.  Pulmonary/Chest: Effort normal and breath sounds normal.  Musculoskeletal: Normal range of motion.  Neurological: He is alert and oriented to person, place, and time.  Skin: No erythema.  Psychiatric: He has a normal mood and affect. His behavior is normal. Judgment and thought content normal.    Results for orders placed or performed in visit on 06/09/17  Microscopic Examination  Result Value Ref Range   WBC, UA 0-5 0 - 5 /hpf   RBC, UA 0-2 0 - 2 /hpf   Epithelial Cells (non renal) CANCELED    Bacteria, UA None seen None seen/Few  CBC with Differential/Platelet  Result Value Ref Range   WBC 6.1 3.4 - 10.8 x10E3/uL   RBC 3.95 (L) 4.14 - 5.80 x10E6/uL   Hemoglobin 12.8 (L) 13.0 - 17.7 g/dL   Hematocrit 38.8 37.5 - 51.0 %   MCV 98 (H) 79 - 97 fL   MCH 32.4 26.6 - 33.0 pg   MCHC 33.0 31.5 - 35.7 g/dL   RDW 14.1 12.3 - 15.4 %   Platelets 170 150 - 379 x10E3/uL   Neutrophils 63 Not Estab. %   Lymphs 23 Not Estab. %   Monocytes 10 Not Estab. %  Eos 4 Not Estab. %   Basos 0 Not Estab. %   Neutrophils Absolute 3.9 1.4 - 7.0 x10E3/uL   Lymphocytes Absolute 1.4 0.7 - 3.1 x10E3/uL   Monocytes Absolute 0.6 0.1 - 0.9 x10E3/uL   EOS (ABSOLUTE) 0.2 0.0 - 0.4 x10E3/uL   Basophils Absolute 0.0 0.0 - 0.2 x10E3/uL   Immature Granulocytes 0 Not Estab. %   Immature Grans (Abs) 0.0 0.0 - 0.1 x10E3/uL  Comprehensive metabolic panel  Result Value Ref Range   Glucose 101 (H) 65 - 99 mg/dL   BUN 17 8 - 27 mg/dL   Creatinine, Ser 1.14 0.76 - 1.27 mg/dL   GFR calc non Af Amer 65 >59 mL/min/1.73   GFR calc Af Amer 75 >59 mL/min/1.73   BUN/Creatinine Ratio 15 10 - 24   Sodium 139 134 - 144 mmol/L   Potassium 3.7 3.5 - 5.2 mmol/L   Chloride 101 96 - 106 mmol/L   CO2 25 20 - 29 mmol/L   Calcium 9.3 8.6 - 10.2  mg/dL   Total Protein 6.2 6.0 - 8.5 g/dL   Albumin 4.0 3.6 - 4.8 g/dL   Globulin, Total 2.2 1.5 - 4.5 g/dL   Albumin/Globulin Ratio 1.8 1.2 - 2.2   Bilirubin Total 0.8 0.0 - 1.2 mg/dL   Alkaline Phosphatase 122 (H) 39 - 117 IU/L   AST 21 0 - 40 IU/L   ALT 19 0 - 44 IU/L  Lipid panel  Result Value Ref Range   Cholesterol, Total 106 100 - 199 mg/dL   Triglycerides 114 0 - 149 mg/dL   HDL 41 >39 mg/dL   VLDL Cholesterol Cal 23 5 - 40 mg/dL   LDL Calculated 42 0 - 99 mg/dL   Chol/HDL Ratio 2.6 0.0 - 5.0 ratio  TSH  Result Value Ref Range   TSH 2.760 0.450 - 4.500 uIU/mL  Urinalysis, Routine w reflex microscopic  Result Value Ref Range   Specific Gravity, UA 1.010 1.005 - 1.030   pH, UA 5.5 5.0 - 7.5   Color, UA Yellow Yellow   Appearance Ur Clear Clear   Leukocytes, UA Negative Negative   Protein, UA Negative Negative/Trace   Glucose, UA Negative Negative   Ketones, UA Negative Negative   RBC, UA Negative Negative   Bilirubin, UA Negative Negative   Urobilinogen, Ur 0.2 0.2 - 1.0 mg/dL   Nitrite, UA Negative Negative   Microscopic Examination See below:       Assessment & Plan:   Problem List Items Addressed This Visit      Cardiovascular and Mediastinum   Hypertension    Discuss hypertension poor control will restart hydralazine 25 mg twice a day and observe control issues.       Relevant Medications   hydrALAZINE (APRESOLINE) 25 MG tablet   hydrochlorothiazide (HYDRODIURIL) 25 MG tablet   CAD (coronary artery disease)    Certain about progressive coronary artery disease will refer back to [facility]      Relevant Medications   hydrALAZINE (APRESOLINE) 25 MG tablet   hydrochlorothiazide (HYDRODIURIL) 25 MG tablet   Other Relevant Orders   Ambulatory referral to Cardiology   Coronary artery disease - Primary   Relevant Medications   hydrALAZINE (APRESOLINE) 25 MG tablet   hydrochlorothiazide (HYDRODIURIL) 25 MG tablet   Other Relevant Orders   Ambulatory  referral to Cardiology       Follow up plan: Return in about 1 month (around 12/20/2017) for Physical Exam.

## 2017-11-25 VITALS — Ht 68.0 in | Wt 230.0 lb

## 2017-11-25 DIAGNOSIS — Z955 Presence of coronary angioplasty implant and graft: Secondary | ICD-10-CM

## 2017-11-25 NOTE — Progress Notes (Signed)
Daily Session Note  Patient Details  Name: Darrell [name] MRN: [id number] Date of Birth: April 24, 1946 Referring Provider:     Cardiac Rehab from 08/26/2017 in [facility]  Referring Provider  [name]      Encounter Date: 11/25/2017  Check In: Session Check In - 11/25/17 0851      Check-In   Location  [facility]-Cardiac [facility] Pulmonary Rehab    Staff Present  Alberteen Sam, MA, RCEP, CCRP, Exercise Physiologist;Amanda Oletta Darter, BA, ACSM CEP, Exercise Physiologist;Carroll Enterkin, RN, BSN    Supervising physician immediately available to respond to emergencies  See telemetry face sheet for immediately available ER MD    Medication changes reported      No    Fall or balance concerns reported     No    Warm-up and Cool-down  Performed on first and last piece of equipment    Resistance Training Performed  Yes    VAD Patient?  No      Pain Assessment   Currently in Pain?  No/denies    Multiple Pain Sites  No          Social History   Tobacco Use  Smoking Status Former Smoker  . Types: Cigarettes  Smokeless Tobacco Never Used  Tobacco Comment   quit 30 years ago     Goals Met:  Independence with exercise equipment Exercise tolerated well Personal goals reviewed No report of cardiac concerns or symptoms Strength training completed today  Goals Unmet:  Not Applicable  Comments: Pt able to follow exercise prescription today without complaint.  Will continue to monitor for progression. See ITP for goal review 6 Minute Walk    Row Name 08/26/17 1427 11/25/17 1148       6 Minute Walk   Phase  -  Discharge    Distance  1600 feet  1760 feet    Distance % Change  -  10 %    Distance Feet Change  -  1600 ft    Walk Time  6 minutes  6 minutes    # of Rest Breaks  0  0    MPH  3.03  3.33    METS  2.96  3.27    RPE  8  12    Perceived Dyspnea   0  -    VO2 Peak  10.37  11.44    Symptoms  No  No    Resting HR  88 bpm  65 bpm    Resting BP  110/64   136/70    Resting Oxygen Saturation   95 %  -    Max Ex. HR  103 bpm  102 bpm    Max Ex. BP  116/68  156/70    2 Minute Post BP  110/62  -        Dr. Emily Filbert is Medical Director for [facility].

## 2017-11-26 ENCOUNTER — Encounter (INDEPENDENT_AMBULATORY_CARE_PROVIDER_SITE_OTHER): Payer: Medicare Other

## 2017-11-26 ENCOUNTER — Ambulatory Visit (INDEPENDENT_AMBULATORY_CARE_PROVIDER_SITE_OTHER): Payer: Medicare Other | Admitting: Vascular Surgery

## 2017-12-06 ENCOUNTER — Encounter: Payer: Self-pay | Admitting: Medical Oncology

## 2017-12-06 ENCOUNTER — Emergency Department: Payer: Medicare Other

## 2017-12-06 ENCOUNTER — Emergency Department
Admission: EM | Admit: 2017-12-06 | Discharge: 2017-12-07 | Disposition: A | Discharge status: Other Acute Inpatient Hospital | Payer: Medicare Other | Attending: Family Medicine | Admitting: Family Medicine

## 2017-12-06 ENCOUNTER — Other Ambulatory Visit: Payer: Self-pay

## 2017-12-06 DIAGNOSIS — I10 Essential (primary) hypertension: Secondary | ICD-10-CM | POA: Diagnosis not present

## 2017-12-06 DIAGNOSIS — Z955 Presence of coronary angioplasty implant and graft: Secondary | ICD-10-CM | POA: Insufficient documentation

## 2017-12-06 DIAGNOSIS — I259 Chronic ischemic heart disease, unspecified: Secondary | ICD-10-CM | POA: Diagnosis not present

## 2017-12-06 DIAGNOSIS — Z7982 Long term (current) use of aspirin: Secondary | ICD-10-CM | POA: Insufficient documentation

## 2017-12-06 DIAGNOSIS — R0602 Shortness of breath: Secondary | ICD-10-CM | POA: Diagnosis not present

## 2017-12-06 DIAGNOSIS — Z7902 Long term (current) use of antithrombotics/antiplatelets: Secondary | ICD-10-CM | POA: Insufficient documentation

## 2017-12-06 DIAGNOSIS — R001 Bradycardia, unspecified: Secondary | ICD-10-CM | POA: Diagnosis not present

## 2017-12-06 DIAGNOSIS — Z79899 Other long term (current) drug therapy: Secondary | ICD-10-CM | POA: Diagnosis not present

## 2017-12-06 DIAGNOSIS — I200 Unstable angina: Secondary | ICD-10-CM | POA: Diagnosis not present

## 2017-12-06 DIAGNOSIS — Z87891 Personal history of nicotine dependence: Secondary | ICD-10-CM | POA: Insufficient documentation

## 2017-12-06 DIAGNOSIS — I2510 Atherosclerotic heart disease of native coronary artery without angina pectoris: Secondary | ICD-10-CM | POA: Diagnosis not present

## 2017-12-06 DIAGNOSIS — R079 Chest pain, unspecified: Secondary | ICD-10-CM | POA: Diagnosis not present

## 2017-12-06 LAB — CBC
HCT: 39.3 % — ABNORMAL LOW (ref 40.0–52.0)
Hemoglobin: 13.5 g/dL (ref 13.0–18.0)
MCH: 32.8 pg (ref 26.0–34.0)
MCHC: 34.5 g/dL (ref 32.0–36.0)
MCV: 95 fL (ref 80.0–100.0)
PLATELETS: 142 10*3/uL — AB (ref 150–440)
RBC: 4.13 MIL/uL — AB (ref 4.40–5.90)
RDW: 13.2 % (ref 11.5–14.5)
WBC: 6.1 10*3/uL (ref 3.8–10.6)

## 2017-12-06 LAB — TROPONIN I: Troponin I: 0.03 ng/mL (ref <0.03)

## 2017-12-06 LAB — BASIC METABOLIC PANEL
Anion gap: 5 (ref 5–15)
BUN: 28 mg/dL — ABNORMAL HIGH (ref 6–20)
CALCIUM: 9.1 mg/dL (ref 8.9–10.3)
CO2: 25 mmol/L (ref 22–32)
CREATININE: 1.16 mg/dL (ref 0.61–1.24)
Chloride: 110 mmol/L (ref 101–111)
GFR calc non Af Amer: >60 mL/min (ref >60)
GLUCOSE: 132 mg/dL — AB (ref 65–99)
Potassium: 3.4 mmol/L — ABNORMAL LOW (ref 3.5–5.1)
Sodium: 140 mmol/L (ref 135–145)

## 2017-12-06 MED ORDER — ASPIRIN 81 MG PO CHEW
324.0000 mg | CHEWABLE_TABLET | Freq: Once | ORAL | Status: AC
  Administered 2017-12-06: 324 mg via ORAL
  Filled 2017-12-06: qty 4

## 2017-12-06 NOTE — ED Notes (Signed)
Repeat EKG

## 2017-12-06 NOTE — ED Triage Notes (Signed)
Pt reports he has been having a tightness feeling in his throat and feeling sob with exertion for a couple of weeks. Pt reports this worsened today after he got home from a 4 hr flight. Pt in NAD at this time, sitting in the wheelchair pt reports he feels fine.

## 2017-12-06 NOTE — ED Provider Notes (Addendum)
[facility] Emergency Department Provider Note  ____________________________________________  Time seen: Approximately 8:29 PM  I have reviewed the triage vital signs and the nursing notes.   HISTORY  Chief Complaint Chest Pain   HPI Darrell [name] is a 72 y.o. male with a history of three-vessel CAD on Plavix, hypertension, hyperlipidemia who presents for evaluation of DOE and throat tightning.  Patient reports for the last few weeks he has been having progressively worsening dyspnea on exertion and a tightness sensation in his throat.  These are similar symptoms to patient's prior heart attack.  Patient has 2 stents and endorses compliance with Plavix.  He reports since yesterday his dyspnea on exertion became more severe and also associated with more severe tightness in his throat.  The symptoms resolve at rest.  He has no pain at this time.  No diaphoresis, nausea, vomiting, palpitations, or dizziness.  Patient denies any personal or family history of blood clots, leg pain or swelling, hemoptysis, exogenous hormones.   Past Medical History:  Diagnosis Date  . CAD (coronary artery disease)   . Cancer [facility])    prostate  . Carotid artery stenosis   . Hyperlipidemia   . Hypertension   . Psoriasis     Patient Active Problem List   Diagnosis Date Noted  . Coronary artery disease 08/26/2017  . Psoriatic arthritis ([place]) 07/23/2017  . OSA (obstructive sleep apnea) 05/27/2017  . Gout 12/17/2016  . Prediabetes 12/17/2016  . Carotid stenosis 06/12/2016  . Psoriasis 05/09/2015  . CAD (coronary artery disease) 05/09/2015  . Sleep apnea 05/09/2015  . Hypertension   . Hyperlipidemia   . Paroxysmal A-fib ([place]) 07/31/2014    Past Surgical History:  Procedure Laterality Date  . CHOLECYSTECTOMY    . CORONARY ANGIOPLASTY WITH STENT PLACEMENT    . LEFT HEART CATH AND CORONARY ANGIOGRAPHY N/A 05/21/2017   Procedure: LEFT HEART CATH AND CORONARY ANGIOGRAPHY;   Surgeon: Teodoro Spray, MD;  Location: [place] CV [facility];  Service: Cardiovascular;  Laterality: N/A;  . LIVER BIOPSY  1999  . SHOULDER SURGERY    . stents      Prior to Admission medications   Medication Sig Start Date End Date Taking? Authorizing Provider  allopurinol (ZYLOPRIM) 300 MG tablet Take 0.5 tablets (150 mg total) daily by mouth. 06/24/17   Plonk, Gwyndolyn Saxon, MD  aspirin EC 81 MG EC tablet Take 1 tablet (81 mg total) by mouth daily. 05/21/17   Bettey Costa, MD  atorvastatin (LIPITOR) 80 MG tablet Take 80 mg by mouth daily.    [provider]  clopidogrel (PLAVIX) 75 MG tablet Take 75 mg by mouth daily.    [provider]  hydrALAZINE (APRESOLINE) 25 MG tablet Take 1 tablet (25 mg total) by mouth 2 (two) times daily. 11/22/17   Guadalupe Maple, MD  hydrochlorothiazide (HYDRODIURIL) 25 MG tablet Take 1 tablet (25 mg total) by mouth daily. 11/22/17   Guadalupe Maple, MD  metoprolol succinate (TOPROL-XL) 25 MG 24 hr tablet TAKE 1 TABLET BY MOUTH EVERY DAY 09/06/17   Guadalupe Maple, MD  naproxen (NAPROSYN) 500 MG tablet Take 1 tablet (500 mg total) 2 (two) times daily with a meal by mouth. 06/24/17   Plonk, Gwyndolyn Saxon, MD  nitroGLYCERIN (NITROSTAT) 0.4 MG SL tablet Place under the tongue. 05/28/17 05/28/18  [provider]  telmisartan (MICARDIS) 40 MG tablet Take 40 mg by mouth daily.    [provider]    Allergies  Patient has no known allergies.  Family History  Problem Relation Age of Onset  . Heart attack Mother   . Diabetes Mother   . Stroke Mother   . Heart disease Mother   . Diabetes Brother   . Heart Problems Brother   . Diabetes Sister   . Lung cancer Maternal Aunt   . Esophageal cancer Sister     Social History Social History   Tobacco Use  . Smoking status: Former Smoker    Types: Cigarettes  . Smokeless tobacco: Never Used  . Tobacco comment: quit 30 years ago   Substance Use Topics  . Alcohol use: Yes    Alcohol/week:  0.6 oz    Types: 1 Cans of beer per week    Comment: occasioanlly   . Drug use: No    Review of Systems  Constitutional: Negative for fever. Eyes: Negative for visual changes. ENT: Negative for sore throat. + throat tightness Neck: No neck pain  Cardiovascular: Negative for chest pain. Respiratory: Negative for shortness of breath. + DOE Gastrointestinal: Negative for abdominal pain, vomiting or diarrhea. Genitourinary: Negative for dysuria. Musculoskeletal: Negative for back pain. Skin: Negative for rash. Neurological: Negative for headaches, weakness or numbness. Psych: No SI or HI  ____________________________________________   PHYSICAL EXAM:  VITAL SIGNS: ED Triage Vitals [12/06/17 1441]  Enc Vitals Group     BP (!) 159/74     Pulse Rate 78     Resp 20     Temp 98 F (36.7 C)     Temp Source Oral     SpO2 97 %     Weight 230 lb (104.3 kg)     Height 5\' 8"  (1.727 m)     Head Circumference      Peak Flow      Pain Score 0     Pain Loc      Pain Edu?      Excl. in [place]?     Constitutional: Alert and oriented. Well appearing and in no apparent distress. HEENT:      Head: Normocephalic and atraumatic.         Eyes: Conjunctivae are normal. Sclera is non-icteric.       Mouth/Throat: Mucous membranes are moist.       Neck: Supple with no signs of meningismus. Cardiovascular: Regular rate and rhythm. No murmurs, gallops, or rubs. 2+ symmetrical distal pulses are present in all extremities. No JVD. Respiratory: Normal respiratory effort. Lungs are clear to auscultation bilaterally. No wheezes, crackles, or rhonchi.  Gastrointestinal: Soft, non tender, and non distended with positive bowel sounds. No rebound or guarding. Musculoskeletal: Nontender with normal range of motion in all extremities. No edema, cyanosis, or erythema of extremities. Neurologic: Normal speech and language. Face is symmetric. Moving all extremities. No gross focal neurologic deficits are  appreciated. Skin: Skin is warm, dry and intact. No rash noted. Psychiatric: Mood and affect are normal. Speech and behavior are normal.  ____________________________________________   LABS (all labs ordered are listed, but only abnormal results are displayed)  Labs Reviewed  BASIC METABOLIC PANEL - Abnormal; Notable for the following components:      Result Value   Potassium 3.4 (*)    Glucose, Bld 132 (*)    BUN 28 (*)    All other components within normal limits  CBC - Abnormal; Notable for the following components:   RBC 4.13 (*)    HCT 39.3 (*)    Platelets 142 (*)  All other components within normal limits  TROPONIN I  TROPONIN I   ____________________________________________  EKG  ED ECG REPORT I, Rudene Re, the attending physician, personally viewed and interpreted this ECG.  Sinus tachycardia with PACs, rate of 69, normal intervals, normal axis, diffuse T wave flattening, no ST elevations or depressions.  Unchanged from prior from 10/18 ____________________________________________  RADIOLOGY  I have personally reviewed the images performed during this visit and I agree with the Radiologist's read.   Interpretation by Radiologist:  Dg Chest 2 View  Result Date: 12/06/2017 CLINICAL DATA:  Shortness of breath and chest pain EXAM: CHEST - 2 VIEW COMPARISON:  05/20/2017 FINDINGS: No acute pulmonary infiltrate or effusion. Cardiomediastinal silhouette within normal limits. Aortic atherosclerosis. No pneumothorax. Degenerative changes of the spine. Surgical clips in the right upper quadrant. IMPRESSION: No active cardiopulmonary disease. Electronically Signed   By: Donavan Foil M.D.   On: 12/06/2017 15:07   ____________________________________________   PROCEDURES  Procedure(s) performed: None Procedures Critical Care performed:  None ____________________________________________   INITIAL IMPRESSION / ASSESSMENT AND PLAN / ED COURSE  72 y.o. male  with a history of three-vessel CAD on Plavix, hypertension, hyperlipidemia who presents for evaluation of progressively worsening dyspnea on exertion and tightening of his throat consistent with prior anginal symptoms.  Patient last left heart catheterization was in October 2018 showing severe three-vessel disease.  He is on Plavix.  His EKG shows no acute ischemic changes.  Her source troponin is negative.  His presentation is concerning for high risk chest pain and unstable angina.  He is asymptomatic at rest.  We will give a full dose of aspirin, and admit patient for further evaluation    _________________________ 9:42 PM on 12/06/2017 -----------------------------------------  Patient evaluated by Dr. Jerelyn Charles, Hospitalist who consulted cardiologist and they felt patient was too complex to be admitted here and had to be transferred to [facility] since on his last admission in 05/2017 his lesions were too complex and patient had to be transferred to [facility] for further management. I discussed with Dr. Janese Banks, cardiology fellow at [facility] who accepted transfer under Dr. Mathis Bud as an attending. He agrees with current management and did not recommended any further interventions. Patient will continue to be monitored on telemetry until transport is available.   As part of my medical decision making, I reviewed the following data within the [place] notes reviewed and incorporated, Labs reviewed , EKG interpreted , Old EKG reviewed, Old chart reviewed, Radiograph reviewed , Discussed with admitting physician , Notes from prior ED visits and [place] Controlled Substance Database    Pertinent labs & imaging results that were available during my care of the patient were reviewed by me and considered in my medical decision making (see chart for details).    ____________________________________________   FINAL CLINICAL IMPRESSION(S) / ED DIAGNOSES  Final diagnoses:  Unstable angina ([place])       NEW MEDICATIONS STARTED DURING THIS VISIT:  ED Discharge Orders    None       Note:  This document was prepared using Dragon voice recognition software and may include unintentional dictation errors.    Rudene Re, MD 12/06/17 [place], [place], MD 12/06/17 [phone]

## 2017-12-06 NOTE — ED Notes (Signed)
Report to [facility] transport

## 2017-12-06 NOTE — H&P (Signed)
[facility] [facility] [place] NAME: Darrell [name]    MR#:  [id number]  DATE OF BIRTH:  08-Oct-1945  DATE OF ADMISSION:  12/06/2017  PRIMARY CARE PHYSICIAN: Guadalupe Maple, MD   REQUESTING/REFERRING PHYSICIAN:   CHIEF COMPLAINT:   Chief Complaint  Patient presents with  . Chest Pain    HISTORY OF PRESENT ILLNESS: [name]  is a 72 y.o. male with a known history per below, noted history of extensive severe coronary artery disease, attempted heart catheterization here in October 2018-unsuccessful, patient subsequently transferred by our cardiology team to [facility] where patient underwent PCI of the mid right coronary artery/placed on aspirin and Plavix for 6 months, returns to our hospital with one-week history of exertional shortness of breath associated with throat tightness similar to previous episodes of heart attacks, patient stated that he would rest and his symptomatology would improve, patient stated that he would stop prior to developing sweating events, in the emergency room patient was found to be bradycardic with heart rate in the 40s and 50s intermittently, hypertensive, potassium was 3.4, chest x-ray was negative, hospitalist asked to evaluate/treat, patient seen in emergency room with wife present, given his extensive coronary artery disease for which patient was transferred to tertiary facility, Dr. Callwood/cardiology was contacted with all questions answered-per cardiology, it was recommended that the patient be transferred back to do hospital for probable need for repeat heart catheterization given the complexity of his coronary artery disease, this information was relayed to the ED attending.  PAST MEDICAL HISTORY:   Past Medical History:  Diagnosis Date  . CAD (coronary artery disease)   . Cancer [facility])    prostate  . Carotid artery stenosis   . Hyperlipidemia   . Hypertension   . Psoriasis     PAST SURGICAL  HISTORY:  Past Surgical History:  Procedure Laterality Date  . CHOLECYSTECTOMY    . CORONARY ANGIOPLASTY WITH STENT PLACEMENT    . LEFT HEART CATH AND CORONARY ANGIOGRAPHY N/A 05/21/2017   Procedure: LEFT HEART CATH AND CORONARY ANGIOGRAPHY;  Surgeon: Teodoro Spray, MD;  Location: [place] [facility];  Service: Cardiovascular;  Laterality: N/A;  . LIVER BIOPSY  1999  . SHOULDER SURGERY    . stents      SOCIAL HISTORY:  Social History   Tobacco Use  . Smoking status: Former Smoker    Types: Cigarettes  . Smokeless tobacco: Never Used  . Tobacco comment: quit 30 years ago   Substance Use Topics  . Alcohol use: Yes    Alcohol/week: 0.6 oz    Types: 1 Cans of beer per week    Comment: occasioanlly     FAMILY HISTORY:  Family History  Problem Relation Age of Onset  . Heart attack Mother   . Diabetes Mother   . Stroke Mother   . Heart disease Mother   . Diabetes Brother   . Heart Problems Brother   . Diabetes Sister   . Lung cancer Maternal Aunt   . Esophageal cancer Sister     DRUG ALLERGIES: No Known Allergies  REVIEW OF SYSTEMS:   CONSTITUTIONAL: No fever, +fatigue, weakness.  EYES: No blurred or double vision.  EARS, NOSE, AND THROAT: No tinnitus or ear pain. + Throat tightness similar to previous episodes of heart attacks RESPIRATORY: No cough, + shortness of breath, no wheezing or hemoptysis.  CARDIOVASCULAR: No chest pain, orthopnea, edema.  GASTROINTESTINAL: No nausea, vomiting, diarrhea or abdominal  pain.  GENITOURINARY: No dysuria, hematuria.  ENDOCRINE: No polyuria, nocturia,  HEMATOLOGY: No anemia, easy bruising or bleeding SKIN: No rash or lesion. MUSCULOSKELETAL: No joint pain or arthritis.   NEUROLOGIC: No tingling, numbness, weakness.  PSYCHIATRY: No anxiety or depression.   MEDICATIONS AT HOME:  Prior to Admission medications   Medication Sig Start Date End Date Taking? Authorizing Provider  allopurinol (ZYLOPRIM) 300 MG tablet Take 0.5  tablets (150 mg total) daily by mouth. 06/24/17  Yes Plonk, Gwyndolyn Saxon, MD  aspirin EC 81 MG EC tablet Take 1 tablet (81 mg total) by mouth daily. 05/21/17  Yes Mody, Ulice Bold, MD  atorvastatin (LIPITOR) 80 MG tablet Take 80 mg by mouth daily.   Yes [provider]  clopidogrel (PLAVIX) 75 MG tablet Take 75 mg by mouth daily.   Yes [provider]  hydrALAZINE (APRESOLINE) 25 MG tablet Take 1 tablet (25 mg total) by mouth 2 (two) times daily. 11/22/17  Yes Crissman, Jeannette How, MD  hydrochlorothiazide (HYDRODIURIL) 25 MG tablet Take 1 tablet (25 mg total) by mouth daily. 11/22/17  Yes Crissman, Jeannette How, MD  metoprolol succinate (TOPROL-XL) 25 MG 24 hr tablet TAKE 1 TABLET BY MOUTH EVERY DAY 09/06/17  Yes Crissman, Jeannette How, MD  nitroGLYCERIN (NITROSTAT) 0.4 MG SL tablet Place under the tongue. 05/28/17 05/28/18 Yes [provider]  telmisartan (MICARDIS) 40 MG tablet Take 40 mg by mouth daily.   Yes [provider]  naproxen (NAPROSYN) 500 MG tablet Take 1 tablet (500 mg total) 2 (two) times daily with a meal by mouth. Patient not taking: Reported on 12/06/2017 06/24/17   Adline Potter, MD      PHYSICAL EXAMINATION:   VITAL SIGNS: Blood pressure (!) 174/86, pulse (!) 48, temperature 98 F (36.7 C), temperature source Oral, resp. rate 15, height 5\' 8"  (1.727 m), weight 104.3 kg (230 lb), SpO2 98 %.  GENERAL:  72 y.o.-year-old patient lying in the bed with no acute distress.  Overweight EYES: Pupils equal, round, reactive to light and accommodation. No scleral icterus. Extraocular muscles intact.  HEENT: Head atraumatic, normocephalic. Oropharynx and nasopharynx clear.  NECK:  Supple, no jugular venous distention. No thyroid enlargement, no tenderness.  LUNGS: Normal breath sounds bilaterally, no wheezing, rales,rhonchi or crepitation. No use of accessory muscles of respiration.  CARDIOVASCULAR: S1, S2 normal. No murmurs, rubs, or gallops.  ABDOMEN: Soft, nontender,  nondistended. Bowel sounds present. No organomegaly or mass.  EXTREMITIES: No pedal edema, cyanosis, or clubbing.  NEUROLOGIC: Cranial nerves II through XII are intact. MAES. Gait not checked.  PSYCHIATRIC: The patient is alert and oriented x 3.  SKIN: No obvious rash, lesion, or ulcer.   LABORATORY PANEL:   CBC Recent Labs  Lab 12/06/17 1447  WBC 6.1  HGB 13.5  HCT 39.3*  PLT 142*  MCV 95.0  MCH 32.8  MCHC 34.5  RDW 13.2   ------------------------------------------------------------------------------------------------------------------  Chemistries  Recent Labs  Lab 12/06/17 1447  NA 140  K 3.4*  CL 110  CO2 25  GLUCOSE 132*  BUN 28*  CREATININE 1.16  CALCIUM 9.1   ------------------------------------------------------------------------------------------------------------------ estimated creatinine clearance is 69.4 mL/min (by C-G formula based on SCr of 1.16 mg/dL). ------------------------------------------------------------------------------------------------------------------ No results for input(s): TSH, T4TOTAL, T3FREE, THYROIDAB in the last 72 hours.  Invalid input(s): FREET3   Coagulation profile No results for input(s): INR, PROTIME in the last 168 hours. ------------------------------------------------------------------------------------------------------------------- No results for input(s): DDIMER in the last 72 hours. -------------------------------------------------------------------------------------------------------------------  Cardiac Enzymes Recent Labs  Lab  12/06/17 1447 12/06/17 2038  TROPONINI <0.03 0.03*   ------------------------------------------------------------------------------------------------------------------ Invalid input(s): POCBNP  ---------------------------------------------------------------------------------------------------------------  Urinalysis    Component Value Date/Time   APPEARANCEUR Clear 06/09/2017  0910   GLUCOSEU Negative 06/09/2017 0910   BILIRUBINUR Negative 06/09/2017 0910   PROTEINUR Negative 06/09/2017 0910   NITRITE Negative 06/09/2017 0910   LEUKOCYTESUR Negative 06/09/2017 0910     RADIOLOGY: Dg Chest 2 View  Result Date: 12/06/2017 CLINICAL DATA:  Shortness of breath and chest pain EXAM: CHEST - 2 VIEW COMPARISON:  05/20/2017 FINDINGS: No acute pulmonary infiltrate or effusion. Cardiomediastinal silhouette within normal limits. Aortic atherosclerosis. No pneumothorax. Degenerative changes of the spine. Surgical clips in the right upper quadrant. IMPRESSION: No active cardiopulmonary disease. Electronically Signed   By: Donavan Foil M.D.   On: 12/06/2017 15:07    EKG: Orders placed or performed during the hospital encounter of 12/06/17  . ED EKG within 10 minutes  . ED EKG within 10 minutes  . ED EKG  . ED EKG    IMPRESSION AND PLAN: *Acute recurrent angina/dyspnea on exertion Compounded by sinus bradycardia In discussion with cardiology/Dr. Clayborn Bigness -it was recommended that patient be transferred back to do given complicated coronary artery disease for further evaluation/care, information was relayed to ED attending with all questions answered    All the records are reviewed and case discussed with ED provider. Management plans discussed with the patient, family and they are in agreement.  CODE STATUS: Code Status History    Date Active Date Inactive Code Status Order ID Comments User Context   05/21/2017 1054 05/21/2017 1524 Full Code [id number]  Teodoro Spray, MD Inpatient   05/20/2017 0502 05/21/2017 1054 Full Code [id number]  Harrie Foreman, MD Inpatient       TOTAL TIME TAKING CARE OF THIS PATIENT: 60 minutes of time was utilized to discuss case with subspecialty staff-ED attending on 2 occasions, cardiology, the patient and the patient's wife with all questions answered.    Avel Peace Salary M.D on 12/06/2017   Between 7am to 6pm - Pager -  [phone]  After 6pm go to [email or web address] - password [email or web address] [facility]  Office  [phone]  CC: Primary care physician; Guadalupe Maple, MD   Note: This dictation was prepared with Dragon dictation along with smaller phrase technology. Any transcriptional errors that result from this process are unintentional.

## 2017-12-07 DIAGNOSIS — G4733 Obstructive sleep apnea (adult) (pediatric): Secondary | ICD-10-CM | POA: Diagnosis not present

## 2017-12-07 DIAGNOSIS — I209 Angina pectoris, unspecified: Secondary | ICD-10-CM | POA: Diagnosis not present

## 2017-12-07 DIAGNOSIS — M109 Gout, unspecified: Secondary | ICD-10-CM | POA: Diagnosis present

## 2017-12-07 DIAGNOSIS — E785 Hyperlipidemia, unspecified: Secondary | ICD-10-CM | POA: Diagnosis not present

## 2017-12-07 DIAGNOSIS — I25118 Atherosclerotic heart disease of native coronary artery with other forms of angina pectoris: Secondary | ICD-10-CM | POA: Diagnosis present

## 2017-12-07 DIAGNOSIS — I480 Paroxysmal atrial fibrillation: Secondary | ICD-10-CM | POA: Diagnosis not present

## 2017-12-07 DIAGNOSIS — Z955 Presence of coronary angioplasty implant and graft: Secondary | ICD-10-CM | POA: Diagnosis not present

## 2017-12-07 DIAGNOSIS — E782 Mixed hyperlipidemia: Secondary | ICD-10-CM | POA: Diagnosis present

## 2017-12-07 DIAGNOSIS — I255 Ischemic cardiomyopathy: Secondary | ICD-10-CM | POA: Diagnosis not present

## 2017-12-07 DIAGNOSIS — I10 Essential (primary) hypertension: Secondary | ICD-10-CM | POA: Diagnosis not present

## 2017-12-07 DIAGNOSIS — I440 Atrioventricular block, first degree: Secondary | ICD-10-CM | POA: Diagnosis present

## 2017-12-07 DIAGNOSIS — I2510 Atherosclerotic heart disease of native coronary artery without angina pectoris: Secondary | ICD-10-CM | POA: Diagnosis not present

## 2017-12-07 DIAGNOSIS — Z7982 Long term (current) use of aspirin: Secondary | ICD-10-CM | POA: Diagnosis not present

## 2017-12-07 DIAGNOSIS — Z7902 Long term (current) use of antithrombotics/antiplatelets: Secondary | ICD-10-CM | POA: Diagnosis not present

## 2017-12-07 DIAGNOSIS — Z9861 Coronary angioplasty status: Secondary | ICD-10-CM | POA: Diagnosis not present

## 2017-12-07 DIAGNOSIS — I200 Unstable angina: Secondary | ICD-10-CM | POA: Diagnosis not present

## 2017-12-07 DIAGNOSIS — Z7401 Bed confinement status: Secondary | ICD-10-CM | POA: Diagnosis not present

## 2017-12-07 DIAGNOSIS — R001 Bradycardia, unspecified: Secondary | ICD-10-CM | POA: Diagnosis present

## 2017-12-07 DIAGNOSIS — Z87891 Personal history of nicotine dependence: Secondary | ICD-10-CM | POA: Diagnosis not present

## 2017-12-07 NOTE — ED Notes (Signed)
EMTALA checked for completion  

## 2017-12-08 ENCOUNTER — Encounter: Payer: Self-pay | Admitting: *Deleted

## 2017-12-08 DIAGNOSIS — Z955 Presence of coronary angioplasty implant and graft: Secondary | ICD-10-CM

## 2017-12-08 NOTE — Progress Notes (Signed)
Cardiac Individual Treatment Plan  Patient Details  Name: Darrell [name] MRN: [id number] Date of Birth: November 03, 1945 Referring Provider:     Cardiac Rehab from 08/26/2017 in [facility] Cardiac and Pulmonary Rehab  Referring Provider  Darrell [name]      Initial Encounter Date:    Cardiac Rehab from 08/26/2017 in [facility] Cardiac and Pulmonary Rehab  Date  08/26/17  Referring Provider  Darrell [name]      Visit Diagnosis: Status post coronary artery stent placement  Patient's Home Medications on Admission:  Current Outpatient Medications:  .  allopurinol (ZYLOPRIM) 300 MG tablet, Take 0.5 tablets (150 mg total) daily by mouth., Disp: 90 tablet, Rfl: 0 .  aspirin EC 81 MG EC tablet, Take 1 tablet (81 mg total) by mouth daily., Disp: , Rfl:  .  atorvastatin (LIPITOR) 80 MG tablet, Take 80 mg by mouth daily., Disp: , Rfl:  .  clopidogrel (PLAVIX) 75 MG tablet, Take 75 mg by mouth daily., Disp: , Rfl:  .  hydrALAZINE (APRESOLINE) 25 MG tablet, Take 1 tablet (25 mg total) by mouth 2 (two) times daily., Disp: 180 tablet, Rfl: 1 .  hydrochlorothiazide (HYDRODIURIL) 25 MG tablet, Take 1 tablet (25 mg total) by mouth daily., Disp: 90 tablet, Rfl: 1 .  metoprolol succinate (TOPROL-XL) 25 MG 24 hr tablet, TAKE 1 TABLET BY MOUTH EVERY DAY, Disp: 90 tablet, Rfl: 0 .  naproxen (NAPROSYN) 500 MG tablet, Take 1 tablet (500 mg total) 2 (two) times daily with a meal by mouth. (Patient not taking: Reported on 12/06/2017), Disp: 10 tablet, Rfl: 0 .  nitroGLYCERIN (NITROSTAT) 0.4 MG SL tablet, Place under the tongue., Disp: , Rfl:  .  telmisartan (MICARDIS) 40 MG tablet, Take 40 mg by mouth daily., Disp: , Rfl:   Past Medical History: Past Medical History:  Diagnosis Date  . CAD (coronary artery disease)   . Cancer [facility])    prostate  . Carotid artery stenosis   . Hyperlipidemia   . Hypertension   . Psoriasis     Tobacco Use: Social History   Tobacco Use  Smoking Status Former Smoker  . Types: Cigarettes   Smokeless Tobacco Never Used  Tobacco Comment   quit 30 years ago     Labs: Recent Review Flowsheet Data    Labs for ITP Cardiac and Pulmonary Rehab Latest Ref Rng & Units 05/19/2016 11/18/2016 12/17/2016 05/20/2017 06/09/2017   Cholestrol 100 - 199 mg/dL 118 112 104 - 106   LDLCALC 0 - 99 mg/dL 44 - 39 - 42   HDL >39 mg/dL 40 - 41 - 41   Trlycerides 0 - 149 mg/dL 171(H) 264(H) 122 - 114   Hemoglobin A1c 4.8 - 5.6 % - - - 5.6 -       Exercise Target Goals:    Exercise Program Goal: Individual exercise prescription set using results from initial 6 min walk test and THRR while considering  patient's activity barriers and safety.   Exercise Prescription Goal: Initial exercise prescription builds to 30-45 minutes a day of aerobic activity, 2-3 days per week.  Home exercise guidelines will be given to patient during program as part of exercise prescription that the participant will acknowledge.  Activity Barriers & Risk Stratification:   6 Minute Walk: 6 Minute Walk    Row Name 08/26/17 1427 11/25/17 1148       6 Minute Walk   Phase  -  Discharge    Distance  1600 feet  1760 feet    Distance %  Change  -  10 %    Distance Feet Change  -  1600 ft    Walk Time  6 minutes  6 minutes    # of Rest Breaks  0  0    MPH  3.03  3.33    METS  2.96  3.27    RPE  8  12    Perceived Dyspnea   0  -    VO2 Peak  10.37  11.44    Symptoms  No  No    Resting HR  88 bpm  65 bpm    Resting BP  110/64  136/70    Resting Oxygen Saturation   95 %  -    Max Ex. HR  103 bpm  102 bpm    Max Ex. BP  116/68  156/70    2 Minute Post BP  110/62  -       Oxygen Initial Assessment:   Oxygen Re-Evaluation:   Oxygen Discharge (Final Oxygen Re-Evaluation):   Initial Exercise Prescription: Initial Exercise Prescription - 08/26/17 1400      Date of Initial Exercise RX and Referring Provider   Date  08/26/17    Referring Provider  Darrell [name]      Treadmill   MPH  2.8    Grade  0.5    Minutes   15    METs  3.34      Recumbant Bike   Level  3    RPM  60    Watts  35    Minutes  15    METs  3      Elliptical   Level  1    Speed  3    Minutes  15      T5 Nustep   Level  2    SPM  80    Minutes  15    METs  3      Prescription Details   Frequency (times per week)  3    Duration  Progress to 30 minutes of continuous aerobic without signs/symptoms of physical distress      Intensity   THRR 40-80% of Max Heartrate  112-137    Ratings of Perceived Exertion  11-13    Perceived Dyspnea  0-4      Resistance Training   Training Prescription  Yes    Weight  5 lb    Reps  10-15       Perform Capillary Blood Glucose checks as needed.  Exercise Prescription Changes: Exercise Prescription Changes    Row Name 08/26/17 1400 09/06/17 1700 09/09/17 1000 09/22/17 1500 10/05/17 1600     Response to Exercise   Blood Pressure (Admit)  110/64  128/80  -  116/62  134/68   Blood Pressure (Exercise)  116/68  138/64  -  128/70  124/70   Blood Pressure (Exit)  110/62  114/68  -  116/64  124/60   Heart Rate (Admit)  88 bpm  56 bpm  -  88 bpm  83 bpm   Heart Rate (Exercise)  103 bpm  130 bpm  -  136 bpm  110 bpm   Heart Rate (Exit)  84 bpm  82 bpm  -  87 bpm  93 bpm   Oxygen Saturation (Admit)  95 %  -  -  -  -   Rating of Perceived Exertion (Exercise)  8  12  -  12  12   Symptoms  -  none  -  none  none   Comments  -  first full day of exercise  -  -  -   Duration  -  Continue with 30 min of aerobic exercise without signs/symptoms of physical distress.  -  Continue with 30 min of aerobic exercise without signs/symptoms of physical distress.  Continue with 30 min of aerobic exercise without signs/symptoms of physical distress.   Intensity  -  THRR unchanged  -  THRR unchanged  THRR unchanged     Progression   Progression  -  Continue to progress workloads to maintain intensity without signs/symptoms of physical distress.  -  Continue to progress workloads to maintain intensity  without signs/symptoms of physical distress.  Continue to progress workloads to maintain intensity without signs/symptoms of physical distress.   Average METs  -  3.34  -  4.64  4.25     Resistance Training   Training Prescription  -  Yes  -  Yes  Yes   Weight  -  5 lb  -  7 lbs  7 lbs   Reps  -  10-15  -  10-15  10-15     Interval Training   Interval Training  -  No  -  No  No     Treadmill   MPH  -  2.8  -  3.5  3.5   Grade  -  0.5  -  2  2   Minutes  -  15  -  15  15   METs  -  3.34  -  4.64  4.64     Recumbant Bike   Level  -  -  -  -  6   Watts  -  -  -  -  57   Minutes  -  -  -  -  15   METs  -  -  -  -  3.89     Elliptical   Level  -  1  -  1  1   Speed  -  3  -  3  3   Minutes  -  15  -  15  15     Home Exercise Plan   Plans to continue exercise at  -  -  [employer] (comment) [employer] (comment) [employer] (comment) [employer]   Frequency  -  -  Add 3 additional days to program exercise sessions.  Add 3 additional days to program exercise sessions.  Add 3 additional days to program exercise sessions.   Initial Home Exercises Provided  -  -  09/09/17  09/09/17  09/09/17   Row Name 10/19/17 1600 11/02/17 1500 11/17/17 1400 11/30/17 1500       Response to Exercise   Blood Pressure (Admit)  134/66  130/80  134/74  136/70    Blood Pressure (Exercise)  156/74  122/70  142/74  156/70    Blood Pressure (Exit)  124/72  126/74  108/66  110/58    Heart Rate (Admit)  69 bpm  90 bpm  66 bpm  65 bpm    Heart Rate (Exercise)  110 bpm  127 bpm  122 bpm  110 bpm    Heart Rate (Exit)  64 bpm  74 bpm  96 bpm  88 bpm    Rating of Perceived Exertion (Exercise)  _0 12  Symptoms  none  none  none  none    Duration  Continue with 30 min of aerobic exercise without signs/symptoms of physical distress.  Continue with 30 min of aerobic exercise without signs/symptoms of physical distress.  Continue with 30 min of aerobic  exercise without signs/symptoms of physical distress.  Continue with 30 min of aerobic exercise without signs/symptoms of physical distress.    Intensity  THRR unchanged  THRR unchanged  THRR unchanged  THRR unchanged      Progression   Progression  Continue to progress workloads to maintain intensity without signs/symptoms of physical distress.  Continue to progress workloads to maintain intensity without signs/symptoms of physical distress.  Continue to progress workloads to maintain intensity without signs/symptoms of physical distress.  Continue to progress workloads to maintain intensity without signs/symptoms of physical distress.    Average METs  4.27  4.64  4  4      Resistance Training   Training Prescription  Yes  Yes  Yes  Yes    Weight  7 lbs  7 lbs  7 lbs  7 lbs    Reps  10-15  10-15  10-15  10-15      Interval Training   Interval Training  No  No  No  No      Treadmill   MPH  3.5  3.5  3.5  3.5    Grade  _0 Minutes  _1 METs  4.64  4.64  4.64  4.64      Recumbant Bike   Level  6  -  6  6    Watts  51  -  43  43    Minutes  15  -  15  15    METs  3.89  -  3.35  3.35      Elliptical   Level  -  _2 Speed  -  2.5  2.5  3.2    Minutes  -  _3 Home Exercise Plan   Plans to continue exercise at  [employer] (comment) [employer] (comment) [employer] (comment) [employer] (comment) [employer]    Frequency  Add 3 additional days to program exercise sessions.  Add 3 additional days to program exercise sessions.  Add 3 additional days to program exercise sessions.  Add 3 additional days to program exercise sessions.    Initial Home Exercises Provided  09/09/17  09/09/17  09/09/17  09/09/17       Exercise Comments: Exercise Comments    Row Name 08/31/17 0846           Exercise Comments   First full day of exercise!  Patient was oriented to gym  and equipment including functions, settings, policies, and procedures.  Patient's individual exercise prescription and treatment plan were reviewed.  All starting workloads were established based on the results of the 6 minute walk test done at initial orientation visit.  The plan for exercise progression was also introduced and progression will be customized based on patient's performance and goals.          Exercise Goals and Review: Exercise Goals    Row Name 08/26/17 1426  Exercise Goals   Increase Physical Activity  Yes       Intervention  Provide advice, education, support and counseling about physical activity/exercise needs.;Develop an individualized exercise prescription for aerobic and resistive training based on initial evaluation findings, risk stratification, comorbidities and participant's personal goals.       Expected Outcomes  Achievement of increased cardiorespiratory fitness and enhanced flexibility, muscular endurance and strength shown through measurements of functional capacity and personal statement of participant.       Increase Strength and Stamina  Yes       Intervention  Provide advice, education, support and counseling about physical activity/exercise needs.;Develop an individualized exercise prescription for aerobic and resistive training based on initial evaluation findings, risk stratification, comorbidities and participant's personal goals.       Expected Outcomes  Achievement of increased cardiorespiratory fitness and enhanced flexibility, muscular endurance and strength shown through measurements of functional capacity and personal statement of participant.       Able to understand and use rate of perceived exertion (RPE) scale  Yes       Intervention  Provide education and explanation on how to use RPE scale       Expected Outcomes  Short Term: Able to use RPE daily in rehab to express subjective intensity level;Long Term:  Able to use RPE to guide  intensity level when exercising independently       Able to understand and use Dyspnea scale  Yes       Intervention  Provide education and explanation on how to use Dyspnea scale       Expected Outcomes  Short Term: Able to use Dyspnea scale daily in rehab to express subjective sense of shortness of breath during exertion;Long Term: Able to use Dyspnea scale to guide intensity level when exercising independently       Knowledge and understanding of Target Heart Rate Range (THRR)  Yes       Intervention  Provide education and explanation of THRR including how the numbers were predicted and where they are located for reference       Expected Outcomes  Short Term: Able to state/look up THRR;Long Term: Able to use THRR to govern intensity when exercising independently;Short Term: Able to use daily as guideline for intensity in rehab       Able to check pulse independently  Yes       Intervention  Provide education and demonstration on how to check pulse in carotid and radial arteries.;Review the importance of being able to check your own pulse for safety during independent exercise       Expected Outcomes  Short Term: Able to explain why pulse checking is important during independent exercise;Long Term: Able to check pulse independently and accurately       Understanding of Exercise Prescription  Yes       Intervention  Provide education, explanation, and written materials on patient's individual exercise prescription       Expected Outcomes  Short Term: Able to explain program exercise prescription;Long Term: Able to explain home exercise prescription to exercise independently          Exercise Goals Re-Evaluation : Exercise Goals Re-Evaluation    Row Name 08/31/17 0846 09/06/17 1704 09/09/17 1050 09/22/17 1527 09/30/17 0903     Exercise Goal Re-Evaluation   Exercise Goals Review  Increase Physical Activity;Increase Strength and Stamina;Able to understand and use rate of perceived exertion (RPE)  scale;Knowledge and understanding of Target Heart Rate Range (  THRR);Understanding of Exercise Prescription  Increase Physical Activity;Increase Strength and Stamina;Understanding of Exercise Prescription  Understanding of Exercise Prescription;Knowledge and understanding of Target Heart Rate Range (THRR);Able to understand and use rate of perceived exertion (RPE) scale;Able to check pulse independently  Increase Physical Activity;Understanding of Exercise Prescription;Increase Strength and Stamina  Increase Physical Activity;Understanding of Exercise Prescription;Increase Strength and Stamina   Comments  Reviewed RPE scale, THR and program prescription with pt today.  Pt voiced understanding and was given a copy of goals to take home.   Darrell [name] is off to a good start in rehab.  He has completed his first full day of exercise.  We will continue to monitor his progression.   Reviewed home exercise with pt today.  Pt plans to walk and go to [employer] for exercise.  Reviewed THR, pulse, RPE, sign and symptoms, NTG use, and when to call 911 or MD.  Also discussed weather considerations and indoor options.  Pt voiced understanding.  Darrell [name] has been doing well in rehab.  He is going to [employer] and is already up to 3.5 mph on the treadmill.  We will continue to monitor his progression.   Darrell [name] has been going to [employer] 4-5 days a week and does weights and cardio.  He is not warming up first.  He is feeling stronger and has more stamina.    Expected Outcomes  Short: Use RPE daily to regulate intensity.  Long: Follow program prescription in THR.  Short: Attend rehab regularly.  Long: Continue to follow program prescription.   Short: Return to [employer] Long: Exericse regularly.   Short: Continue to build workload on ellipitical.  Long: Continue to exericse more on off days.   Short: Add warm-up to routine.  Long: Continue to exercise on off days.    [place] Name 10/05/17 1621 10/19/17 1607 11/02/17 0910  11/17/17 1428 11/25/17 0929     Exercise Goal Re-Evaluation   Exercise Goals Review  Increase Physical Activity;Understanding of Exercise Prescription;Increase Strength and Stamina  Increase Physical Activity;Understanding of Exercise Prescription;Increase Strength and Stamina  Increase Physical Activity;Understanding of Exercise Prescription;Increase Strength and Stamina  Increase Physical Activity;Understanding of Exercise Prescription;Increase Strength and Stamina  Increase Physical Activity;Understanding of Exercise Prescription;Increase Strength and Stamina   Comments  Darrell [name] continues to do well in rehab.  This week he is visiting his granddaughter in [place].  He is now up to level 6 on the bike.  We will continue to monitor his progression.   Darrell [name] has been doing well in rehab.  He is now up to 51 watts on the recumbent bike.  We will continue to monitor his progression.   Darrell [name] continues to do well in rehab.  He tries to get to the gym, but last week his schedule was full and he did not go.  He knows he needs to exercise and it helps him feel better. Overall he is feelilng good.   Darrell [name] has continued to do well in rehab. He is getting to the gym when he can.  He is doing well on elliptical.  We will continue to monitor his progression.   Darrell [name] has been doing well in rehab.  He is getting to [employer] 4 nights a week.  His wife is walking some with him, but has had get away from gym.  He is feeling a little weaker since starting and has moved up his cardiology visit.  When he is working hard, he wears out a lot faster.  He is worried that it may be his other blockage.  He has been able to snap back like he did before.    Expected Outcomes  Short: Add warm up into routine at home.  Long: Continue to exercise daily for stress relief.  Short: Continue to exercise more at home on off days.  Long: Continue to use exercise for stress relief.   Short: Make time to go to gym!  Long: Continue to exercise for  stress relief  Short: Continue to make time to go to gym.  Long: Continue to exercise indepedently  Short: Exercise/walk while out in [place].  Long: Continue to build strength and stamina.       Discharge Exercise Prescription (Final Exercise Prescription Changes): Exercise Prescription Changes - 11/30/17 1500      Response to Exercise   Blood Pressure (Admit)  136/70    Blood Pressure (Exercise)  156/70    Blood Pressure (Exit)  110/58    Heart Rate (Admit)  65 bpm    Heart Rate (Exercise)  110 bpm    Heart Rate (Exit)  88 bpm    Rating of Perceived Exertion (Exercise)  12    Symptoms  none    Duration  Continue with 30 min of aerobic exercise without signs/symptoms of physical distress.    Intensity  THRR unchanged      Progression   Progression  Continue to progress workloads to maintain intensity without signs/symptoms of physical distress.    Average METs  4      Resistance Training   Training Prescription  Yes    Weight  7 lbs    Reps  10-15      Interval Training   Interval Training  No      Treadmill   MPH  3.5    Grade  2    Minutes  15    METs  4.64      Recumbant Bike   Level  6    Watts  43    Minutes  15    METs  3.35      Elliptical   Level  1    Speed  3.2    Minutes  15      Home Exercise Plan   Plans to continue exercise at  [employer] (comment) [employer]    Frequency  Add 3 additional days to program exercise sessions.    Initial Home Exercises Provided  09/09/17       Nutrition:  Target Goals: Understanding of nutrition guidelines, daily intake of sodium <153m, cholesterol <202m calories 30% from fat and 7% or less from saturated fats, daily to have 5 or more servings of fruits and vegetables.  Biometrics: Pre Biometrics - 08/26/17 1425      Pre Biometrics   Height  5' 7.5" (1.715 m)    Weight  223 lb 14.4 oz (101.6 kg)    Waist Circumference  44 inches    Hip Circumference  45 inches    Waist to Hip Ratio  0.98 %     BMI (Calculated)  34.53    Single Leg Stand  30 seconds      Post Biometrics - 11/25/17 1149       Post  Biometrics   Height  _0  (1.727 m)    Weight  230 lb (104.3 kg)    Waist Circumference  44 inches    Hip Circumference  43 inches    Waist to  Hip Ratio  1.02 %    BMI (Calculated)  34.98    Single Leg Stand  30 seconds       Nutrition Therapy Plan and Nutrition Goals: Nutrition Therapy & Goals - 09/08/17 1535      Nutrition Therapy   Diet  TLC    Protein (specify units)  1-4oz each meal    Saturated Fats  15 max. grams    Fruits and Vegetables  5 servings/day    Sodium  1500 grams      Personal Nutrition Goals   Personal Goal #2  Continue to work on gradual changes to control food portions and make heart healthy (low saturated fat, high fiber/veg/fruit) food choices.     Additional Goals?  No    Comments  Mr. Sparlin feels he is doing well with diet, other than some struggle to lose weight. He has reduced food portions and plans to continue working to control caloric intake.        Nutrition Assessments: Nutrition Assessments - 08/26/17 1248      MEDFICTS Scores   Pre Score  40       Nutrition Goals Re-Evaluation: Nutrition Goals Re-Evaluation    Row Name 09/07/17 0911 09/30/17 0908 11/02/17 0916 11/25/17 0934       Goals   Nutrition Goal  Meet with nurtitionist  Heart healty choices, more fruits and vegetables, less fried foods, continue with portion control  Heart healty choices, more fruits and vegetables, less fried foods, continue with portion control, portion control  Heart healty choices, more fruits and vegetables, less fried foods, continue with portion control, portion control    Comment  made appointment to meet with dietician tomorrow afternoon.  Darrell [name] is dong better with portion control.  He is eating more fruits and vegetables.  He is still struggling with fried foods as he enjoys eating.    Darrell [name] is doing better with his nutrition.  He went to  [employer] last week and actually got the baked chicken!!  He also avoided the bread, potatoes, and desserts!  He was quite proud of himself.    He is trying to stay away from the fired foods.  He has also continued to work on his portion control!  Darrell [name] really has worked on his portion control, but he is not watching what he is eating as much.  He know that he needs to eat better overall. He is willing to wok on it.    Expected Outcome  Short: Meet with nutritionist  Long: Follow recommendations.  Short: Cut back on fried foods.  Long: Continue to follow recommendations and heart healthy diet.   Short: Continue to trade out fried foods.  Long: Continue to work on heart healthy diet.   Short: Continue to avoid more fried foods.  Long: Continue to watch portions.        Nutrition Goals Discharge (Final Nutrition Goals Re-Evaluation): Nutrition Goals Re-Evaluation - 11/25/17 0934      Goals   Nutrition Goal  Heart healty choices, more fruits and vegetables, less fried foods, continue with portion control, portion control    Comment  Darrell [name] really has worked on his portion control, but he is not watching what he is eating as much.  He know that he needs to eat better overall. He is willing to wok on it.    Expected Outcome  Short: Continue to avoid more fried foods.  Long: Continue to watch portions.  Psychosocial: Target Goals: Acknowledge presence or absence of significant depression and/or stress, maximize coping skills, provide positive support system. Participant is able to verbalize types and ability to use techniques and skills needed for reducing stress and depression.   Initial Review & Psychosocial Screening: Initial Psych Review & Screening - 08/26/17 1242      Initial Review   Current issues with  Current Anxiety/Panic;Current Sleep Concerns;Current Stress Concerns    Source of Stress Concerns  Occupation;Unable to perform yard/household activities    Comments  Due to age and  chronic illness unable to do all he wants to do for activities      [place]?  Yes spouse      Barriers   Psychosocial barriers to participate in program  There are no identifiable barriers or psychosocial needs.;The patient should benefit from training in stress management and relaxation.      Screening Interventions   Interventions  Encouraged to exercise;Provide feedback about the scores to participant;To provide support and resources with identified psychosocial needs;Yes    Expected Outcomes  Short Term goal: Utilizing psychosocial counselor, staff and physician to assist with identification of specific Stressors or current issues interfering with healing process. Setting desired goal for each stressor or current issue identified.;Long Term Goal: Stressors or current issues are controlled or eliminated.;Short Term goal: Identification and review with participant of any Quality of Life or Depression concerns found by scoring the questionnaire.;Long Term goal: The participant improves quality of Life and PHQ9 Scores as seen by post scores and/or verbalization of changes       Quality of Life Scores:  Quality of Life - 08/26/17 1247      Quality of Life Scores   Health/Function Pre  16.27 %    Socioeconomic Pre  20.38 %    Psych/Spiritual Pre  17.64 %    Family Pre  24 %    GLOBAL Pre  18.59 %      Scores of 19 and below usually indicate a poorer quality of life in these areas.  A difference of  2-3 points is a clinically meaningful difference.  A difference of 2-3 points in the total score of the Quality of Life Index has been associated with significant improvement in overall quality of life, self-image, physical symptoms, and general health in studies assessing change in quality of life.  PHQ-9: Recent Review Flowsheet Data    Depression screen [facility] 2/9 08/26/2017 06/03/2017 11/18/2016 05/19/2016 05/09/2015   Decreased Interest 1 0 0 0 0   Down, Depressed,  Hopeless 1 0 0 0 0   PHQ - 2 Score 2 0 0 0 0   Altered sleeping 2 - - - -   Tired, decreased energy 1 - - - -   Change in appetite 0 - - - -   Feeling bad or failure about yourself  0 - - - -   Trouble concentrating 0 - - - -   Moving slowly or fidgety/restless 0 - - - -   Suicidal thoughts 0 - - - -   PHQ-9 Score 5 - - - -   Difficult doing work/chores Not difficult at all - - - -     Interpretation of Total Score  Total Score Depression Severity:  1-4 = Minimal depression, 5-9 = Mild depression, 10-14 = Moderate depression, 15-19 = Moderately severe depression, 20-27 = Severe depression   Psychosocial Evaluation and Intervention: Psychosocial Evaluation - 08/31/17 0930  Psychosocial Evaluation & Interventions   Interventions  Encouraged to exercise with the program and follow exercise prescription;Relaxation education;Stress management education    Comments  Counselor met with Darrell [name] Darrell [name]) today for initial psychosocial evaluation.  He is a 72 year old who had a heart cath and (3) stents inserted recently for blockages.  He has a strong support system with a spouse of 74 years; a host of nieces and nephews and a sister who lives locally.  Darrell [name] is also actively involved in his local church community.  He reports sleeping "okay" although he has sleep apnea and indicated the CPAP "did not work" for him.  His appetite is "too good."  He denies a history of depression or anxiety or any current symptoms.  Darrell [name] states his mood is typically positive most of the time.  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  He has goals to "feel better about exercising" since his procedure; and to get a baseline to know how to exercise on his own with confidence.  Staff will be following with Darrell [name] throughout the course of this program.      Expected Outcomes  Darrell [name] will benefit from consistent exercise to achieve his stated goals.  The educational  and psychoeducational components of this program will be helpful in understanding his condition and learning positive ways to cope with the stress in his life.      Continue Psychosocial Services   Follow up required by staff       Psychosocial Re-Evaluation: Psychosocial Re-Evaluation    [place] Name 09/30/17 0910 11/02/17 0918 11/25/17 0935         Psychosocial Re-Evaluation   Current issues with  Current Stress Concerns  Current Stress Concerns  Current Stress Concerns;Current Sleep Concerns     Comments  Darrell [name] is going to visit his grandson next week in [place] for his second birthday.  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  He is looking forward to having time off next week.  They can't even go out to eat without people coming up to visit with him.  His wife is very supportive of him.    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  It keeps him very busy.  However, he continues to sleep well and is coping well overall. He knows going to the gym helps him regulate his stress levels.    Darrell [name] is doing well mentally.  He is doing his best to manage his job stress.  He has found exercise to helpful.  He is not sleeping now.  Only getting 4-5 hrs a night. He doesn't go to bed until 1130pm.  He is going to try to get to bed a little earlier.      Expected Outcomes  Short: Enjoy trip to [place]: Continue to cope well with stressors.   Short: Continue to exercise to reduce stress.  Long: Continue to cope with job.   Short: Try to get to bed earlier.  Long: Continue to cope with job stress.      Interventions  Encouraged to attend Cardiac Rehabilitation for the exercise;Stress management education  Encouraged to attend Cardiac Rehabilitation for the exercise;Stress management education  Encouraged to attend Cardiac Rehabilitation for the exercise;Stress management education     Continue Psychosocial Services   Follow up required by staff  Follow up required by staff   Follow up  required by staff       Initial Review   Source of Stress Concerns  Occupation;Unable to perform yard/household activities  Occupation;Unable to perform yard/household activities  -        Psychosocial Discharge (Final Psychosocial Re-Evaluation): Psychosocial Re-Evaluation - 11/25/17 0935      Psychosocial Re-Evaluation   Current issues with  Current Stress Concerns;Current Sleep Concerns    Comments  Darrell [name] is doing well mentally.  He is doing his best to manage his job stress.  He has found exercise to helpful.  He is not sleeping now.  Only getting 4-5 hrs a night. He doesn't go to bed until 1130pm.  He is going to try to get to bed a little earlier.     Expected Outcomes  Short: Try to get to bed earlier.  Long: Continue to cope with job stress.     Interventions  Encouraged to attend Cardiac Rehabilitation for the exercise;Stress management education    Continue Psychosocial Services   Follow up required by staff       Vocational Rehabilitation: Provide vocational rehab assistance to qualifying candidates.   Vocational Rehab Evaluation & Intervention: Vocational Rehab - 08/26/17 1244      Initial Vocational Rehab Evaluation & Intervention   Assessment shows need for Vocational Rehabilitation  No       Education: Education Goals: Education classes will be provided on a variety of topics geared toward better understanding of heart health and risk factor modification. Participant will state understanding/return demonstration of topics presented as noted by education test scores.  Learning Barriers/Preferences: Learning Barriers/Preferences - 08/26/17 1244      Learning Barriers/Preferences   Learning Barriers  None    Learning Preferences  Individual Instruction       Education Topics:  AED/CPR: - Group verbal and written instruction with the use of models to demonstrate the basic use of the AED with the basic ABC's of resuscitation.   General Nutrition  Guidelines/Fats and Fiber: -Group instruction provided by verbal, written material, models and posters to present the general guidelines for heart healthy nutrition. Gives an explanation and review of dietary fats and fiber.   Cardiac Rehab from 11/25/2017 in [facility] Cardiac and Pulmonary Rehab  Date  11/09/17  Educator  PI  Instruction Review Code  1- Verbalizes Understanding      Controlling Sodium/Reading Food Labels: -Group verbal and written material supporting the discussion of sodium use in heart healthy nutrition. Review and explanation with models, verbal and written materials for utilization of the food label.   Cardiac Rehab from 11/25/2017 in [facility] Cardiac and Pulmonary Rehab  Date  11/16/17  Educator  PI  Instruction Review Code  1- Verbalizes Understanding      Exercise Physiology & General Exercise Guidelines: - Group verbal and written instruction with models to review the exercise physiology of the cardiovascular system and associated critical values. Provides general exercise guidelines with specific guidelines to those with heart or lung disease.    Cardiac Rehab from 11/25/2017 in [facility] Cardiac and Pulmonary Rehab  Date  11/25/17  Educator  [facility]  Instruction Review Code  1- Verbalizes Understanding      Aerobic Exercise & Resistance Training: - Gives group verbal and written instruction on the various components of exercise. Focuses on aerobic and resistive training programs and the benefits of this training and how to safely progress through these programs..   Flexibility, Balance, Mind/Body Relaxation: Provides group verbal/written instruction on the benefits of flexibility  and balance training, including mind/body exercise modes such as yoga, pilates and tai chi.  Demonstration and skill practice provided.   Cardiac Rehab from 11/25/2017 in [facility] Cardiac and Pulmonary Rehab  Date  10/14/17  Educator  AS  Instruction Review Code  1- Verbalizes Understanding       Stress and Anxiety: - Provides group verbal and written instruction about the health risks of elevated stress and causes of high stress.  Discuss the correlation between heart/lung disease and anxiety and treatment options. Review healthy ways to manage with stress and anxiety.   Cardiac Rehab from 11/25/2017 in [facility] Cardiac and Pulmonary Rehab  Date  10/19/17  Educator  [facility]  Instruction Review Code  1- Verbalizes Understanding      Depression: - Provides group verbal and written instruction on the correlation between heart/lung disease and depressed mood, treatment options, and the stigmas associated with seeking treatment.   Anatomy & Physiology of the Heart: - Group verbal and written instruction and models provide basic cardiac anatomy and physiology, with the coronary electrical and arterial systems. Review of Valvular disease and Heart Failure   Cardiac Rehab from 11/25/2017 in [facility] Cardiac and Pulmonary Rehab  Date  11/04/17  Educator  CE  Instruction Review Code  1- Verbalizes Understanding      Cardiac Procedures: - Group verbal and written instruction to review commonly prescribed medications for heart disease. Reviews the medication, class of the drug, and [name] effects. Includes the steps to properly store meds and maintain the prescription regimen. (beta blockers and nitrates)   Cardiac Rehab from 11/25/2017 in [facility] Cardiac and Pulmonary Rehab  Date  11/11/17  Educator  CE  Instruction Review Code  1- Verbalizes Understanding      Cardiac Medications I: - Group verbal and written instruction to review commonly prescribed medications for heart disease. Reviews the medication, class of the drug, and [name] effects. Includes the steps to properly store meds and maintain the prescription regimen.   Cardiac Rehab from 11/25/2017 in [facility] Cardiac and Pulmonary Rehab  Date  08/31/17  Educator  SB  Instruction Review Code  1- Verbalizes Understanding      Cardiac Medications  II: -Group verbal and written instruction to review commonly prescribed medications for heart disease. Reviews the medication, class of the drug, and [name] effects. (all other drug classes)   Cardiac Rehab from 11/25/2017 in [facility] Cardiac and Pulmonary Rehab  Date  10/21/17  Educator  [facility]  Instruction Review Code  1- Verbalizes Understanding       Go Sex-Intimacy & Heart Disease, Get SMART - Goal Setting: - Group verbal and written instruction through game format to discuss heart disease and the return to sexual intimacy. Provides group verbal and written material to discuss and apply goal setting through the application of the S.M.A.R.T. Method.   Cardiac Rehab from 11/25/2017 in [facility] Cardiac and Pulmonary Rehab  Date  11/11/17  Educator  CE  Instruction Review Code  1- Verbalizes Understanding      Other Matters of the Heart: - Provides group verbal, written materials and models to describe Stable Angina and Peripheral Artery. Includes description of the disease process and treatment options available to the cardiac patient.   Cardiac Rehab from 11/25/2017 in [facility] Cardiac and Pulmonary Rehab  Date  09/09/17  Educator  CE  Instruction Review Code  1- Verbalizes Understanding      Exercise & Equipment Safety: - Individual verbal instruction and demonstration of equipment use and safety  with use of the equipment.   Cardiac Rehab from 11/25/2017 in [facility] Cardiac and Pulmonary Rehab  Date  08/26/17  Educator  Sb  Instruction Review Code  1- Verbalizes Understanding      Infection Prevention: - Provides verbal and written material to individual with discussion of infection control including proper hand washing and proper equipment cleaning during exercise session.   Cardiac Rehab from 11/25/2017 in [facility] Cardiac and Pulmonary Rehab  Date  08/26/17  Educator  Sb  Instruction Review Code  1- Verbalizes Understanding      Falls Prevention: - Provides verbal and written material to  individual with discussion of falls prevention and safety.   Cardiac Rehab from 11/25/2017 in [facility] Cardiac and Pulmonary Rehab  Date  08/26/17  Educator  Sb  Instruction Review Code  1- Verbalizes Understanding      Diabetes: - Individual verbal and written instruction to review signs/symptoms of diabetes, desired ranges of glucose level fasting, after meals and with exercise. Acknowledge that pre and post exercise glucose checks will be done for 3 sessions at entry of program.   Know Your Numbers and Risk Factors: -Group verbal and written instruction about important numbers in your health.  Discussion of what are risk factors and how they play a role in the disease process.  Review of Cholesterol, Blood Pressure, Diabetes, and BMI and the role they play in your overall health.   Cardiac Rehab from 11/25/2017 in [facility] Cardiac and Pulmonary Rehab  Date  10/21/17  Educator  [facility]  Instruction Review Code  1- Verbalizes Understanding      Sleep Hygiene: -Provides group verbal and written instruction about how sleep can affect your health.  Define sleep hygiene, discuss sleep cycles and impact of sleep habits. Review good sleep hygiene tips.    Cardiac Rehab from 11/25/2017 in [facility] Cardiac and Pulmonary Rehab  Date  11/02/17  Educator  [facility]  Instruction Review Code  1- Verbalizes Understanding      Other: -Provides group and verbal instruction on various topics (see comments)   Knowledge Questionnaire Score: Knowledge Questionnaire Score - 08/26/17 1244      Knowledge Questionnaire Score   Pre Score  24/28       Core Components/Risk Factors/Patient Goals at Admission:   Core Components/Risk Factors/Patient Goals Review:  Goals and Risk Factor Review    Row Name 09/30/17 0904 11/02/17 0913 11/25/17 0932         Core Components/Risk Factors/Patient Goals Review   Personal Goals Review  Weight Management/Obesity;Hypertension;Lipids  Weight Management/Obesity;Hypertension;Lipids   Weight Management/Obesity;Hypertension;Lipids     Review  [name] blood pressures have been good.  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  His weight is up higher than  he wants as he is still not eating perfectly. He has still been eating some fried foods but trying to get better.  We talked about adding intervals to help boosting weight loss.   Medications seem to working for now.  He has follow up appointment with cards but they did not do blood work.   Darrell [name] continues to do well in rehab.  His weight stays between 227-230 lbs.  He would like to lose more, but continues to eat some and has not been at the gym as much. He is going to try to get to gym and continue to eat better.  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  Lipids are doing well  and his meds are working for him.   Darrell [name] continues to do well in rehab.  His weight has stayed about the same.  He has cut back on portions, but not really altered what he is eating.  He continues to get his pressure checked regularly.  He has decreased his hydralzine to twice a day versus three time a day since we were getting low numbers. He has an annual physical for blood work at the end of the month.      Expected Outcomes  Short: Add in intervals to help with weight loss.  Long: Continue to work on risk factor modifications.   Short: Get back to gym to work on weight loss.  Long: Continue to monitor risk factors.   Short: Get blood work done to check lipids.  Long: Continue to work on weight loss.         Core Components/Risk Factors/Patient Goals at Discharge (Final Review):  Goals and Risk Factor Review - 11/25/17 0932      Core Components/Risk Factors/Patient Goals Review   Personal Goals Review  Weight Management/Obesity;Hypertension;Lipids    Review  Darrell [name] continues to do well in rehab.  His weight has stayed about the same.  He has cut back on portions, but not really altered what he is eating.  He  continues to get his pressure checked regularly.  He has decreased his hydralzine to twice a day versus three time a day since we were getting low numbers. He has an annual physical for blood work at the end of the month.     Expected Outcomes  Short: Get blood work done to check lipids.  Long: Continue to work on weight loss.        ITP Comments: ITP Comments    Row Name 08/26/17 1250 09/15/17 0620 09/23/17 1035 10/13/17 0627 11/10/17 0630   ITP Comments  Medical review comlpeted  ITP sent to Dr Elta Guadeloupe Sabra Heck for review, changes as needed and signature.  Diagnosis documentatincan be found in [facility] Visit 05/27/2017  30 Day review. Continue with ITP unless directed changes per Medical Director review.   New to program  Multiple PVC started when he was using small hand weights in Cardiac Rehab. Koltan said he didn't feel PVCs but did feel a little light headed. Blood pressure was 104/58 given 360 cc of water. Blood pressure came up to 118/70. Will fax PVC's to MD.   30 day review. Continue with ITP unless directed changes per Medical Director review. only 3 visits this month  30 Day review. Continue with ITP unless directed changes per Medical Director review.     Row Name 11/30/17 1552 12/08/17 0545         ITP Comments  Darrell [name] is out this week visiting his grandchildren in [place].  30 day review. Continue with ITP unless directed changes per Medical Director         Comments:

## 2017-12-13 DIAGNOSIS — I10 Essential (primary) hypertension: Secondary | ICD-10-CM | POA: Diagnosis not present

## 2017-12-13 DIAGNOSIS — I2510 Atherosclerotic heart disease of native coronary artery without angina pectoris: Secondary | ICD-10-CM | POA: Diagnosis not present

## 2017-12-13 DIAGNOSIS — G4733 Obstructive sleep apnea (adult) (pediatric): Secondary | ICD-10-CM | POA: Diagnosis not present

## 2017-12-14 ENCOUNTER — Ambulatory Visit (INDEPENDENT_AMBULATORY_CARE_PROVIDER_SITE_OTHER): Payer: Medicare Other | Admitting: Vascular Surgery

## 2017-12-14 ENCOUNTER — Encounter: Payer: Medicare Other | Admitting: *Deleted

## 2017-12-14 ENCOUNTER — Ambulatory Visit (INDEPENDENT_AMBULATORY_CARE_PROVIDER_SITE_OTHER): Payer: Medicare Other

## 2017-12-14 ENCOUNTER — Encounter (INDEPENDENT_AMBULATORY_CARE_PROVIDER_SITE_OTHER): Payer: Self-pay | Admitting: Vascular Surgery

## 2017-12-14 VITALS — BP 117/65 | HR 68 | Resp 16 | Ht 69.0 in | Wt 228.0 lb

## 2017-12-14 DIAGNOSIS — E785 Hyperlipidemia, unspecified: Secondary | ICD-10-CM

## 2017-12-14 DIAGNOSIS — I10 Essential (primary) hypertension: Secondary | ICD-10-CM | POA: Diagnosis not present

## 2017-12-14 DIAGNOSIS — I200 Unstable angina: Secondary | ICD-10-CM

## 2017-12-14 DIAGNOSIS — Z955 Presence of coronary angioplasty implant and graft: Secondary | ICD-10-CM

## 2017-12-14 DIAGNOSIS — I6523 Occlusion and stenosis of bilateral carotid arteries: Secondary | ICD-10-CM | POA: Diagnosis not present

## 2017-12-14 NOTE — Progress Notes (Signed)
Daily Session Note  Patient Details  Name: Darrell [name] MRN: [id number] Date of Birth: Dec 31, 1945 Referring Provider:     Cardiac Rehab from 08/26/2017 in [facility] Cardiac and Pulmonary Rehab  Referring Provider  [name]      Encounter Date: 12/14/2017  Check In: Session Check In - 12/14/17 0902      Check-In   Location  [facility]-Cardiac & Pulmonary Rehab    Staff Present  Heath Lark, RN, BSN, CCRP;Aycen Porreca [place], MA, RCEP, CCRP, Exercise Physiologist;Amanda Oletta Darter, [place], ACSM CEP, Exercise Physiologist    Supervising physician immediately available to respond to emergencies  See telemetry face sheet for immediately available ER MD    Medication changes reported      No    Fall or balance concerns reported     No    Warm-up and Cool-down  Performed on first and last piece of equipment    Resistance Training Performed  Yes    VAD Patient?  No      Pain Assessment   Currently in Pain?  No/denies          Social History   Tobacco Use  Smoking Status Former Smoker  . Types: Cigarettes  Smokeless Tobacco Never Used  Tobacco Comment   quit 30 years ago     Goals Met:  Independence with exercise equipment Exercise tolerated well No report of cardiac concerns or symptoms Strength training completed today  Goals Unmet:  Not Applicable  Comments: Pt able to follow exercise prescription today without complaint.  Will continue to monitor for progression.    Dr. Emily Filbert is Medical Director for [place] and [facility].

## 2017-12-14 NOTE — Progress Notes (Signed)
MRN : [id number]  Darrell [name] is a 72 y.o. (11-30-1945) male who presents with chief complaint of  Chief Complaint  Patient presents with  . Carotid    1 year carotid f/u  .  History of Present Illness: Patient returns in follow-up of his carotid disease.  He is doing well today without specific complaints.  We talked about the city of [place] and the multiple interesting projects that are going on downtown.  He denies any focal neurologic symptoms. Specifically, the patient denies amaurosis fugax, speech or swallowing difficulties, or arm or leg weakness or numbness.  His carotid duplex today shows better velocities than in his previous study with velocities in the 1 to 39% range bilaterally.  Current Outpatient Medications  Medication Sig Dispense Refill  . allopurinol (ZYLOPRIM) 300 MG tablet Take 0.5 tablets (150 mg total) daily by mouth. 90 tablet 0  . apixaban (ELIQUIS) 5 MG TABS tablet Take by mouth.    Marland Kitchen atorvastatin (LIPITOR) 80 MG tablet Take 80 mg by mouth daily.    . carvedilol (COREG) 3.125 MG tablet   0  . hydrochlorothiazide (HYDRODIURIL) 25 MG tablet Take 1 tablet (25 mg total) by mouth daily. 90 tablet 1  . isosorbide mononitrate (IMDUR) 30 MG 24 hr tablet Take by mouth.    . metoprolol succinate (TOPROL-XL) 25 MG 24 hr tablet TAKE 1 TABLET BY MOUTH EVERY DAY 90 tablet 0  . naproxen (NAPROSYN) 500 MG tablet Take 1 tablet (500 mg total) 2 (two) times daily with a meal by mouth. 10 tablet 0  . nitroGLYCERIN (NITROSTAT) 0.4 MG SL tablet Place under the tongue.    Marland Kitchen aspirin EC 81 MG EC tablet Take 1 tablet (81 mg total) by mouth daily. (Patient not taking: Reported on 12/14/2017)    . clopidogrel (PLAVIX) 75 MG tablet Take 75 mg by mouth daily.    . hydrALAZINE (APRESOLINE) 25 MG tablet Take 1 tablet (25 mg total) by mouth 2 (two) times daily. (Patient not taking: Reported on 12/14/2017) 180 tablet 1  . telmisartan (MICARDIS) 40 MG tablet Take 40 mg by mouth daily.      No current facility-administered medications for this visit.     Past Medical History:  Diagnosis Date  . CAD (coronary artery disease)   . Cancer [facility])    prostate  . Carotid artery stenosis   . Hyperlipidemia   . Hypertension   . Psoriasis     Past Surgical History:  Procedure Laterality Date  . CHOLECYSTECTOMY    . CORONARY ANGIOPLASTY WITH STENT PLACEMENT    . LEFT HEART CATH AND CORONARY ANGIOGRAPHY N/A 05/21/2017   Procedure: LEFT HEART CATH AND CORONARY ANGIOGRAPHY;  Surgeon: Teodoro Spray, MD;  Location: [place] [facility];  Service: Cardiovascular;  Laterality: N/A;  . LIVER BIOPSY  1999  . SHOULDER SURGERY    . stents      Social History       Social History  Substance Use Topics  . Smoking status: Former Smoker    Types: Cigarettes  . Smokeless tobacco: Never Used  . Alcohol use 0.0 oz/week       Family History      Family History  Problem Relation Age of Onset  . Heart attack Mother   . Diabetes Mother   . Stroke Mother   . Heart disease Mother   . Diabetes Brother     No Known Allergies   REVIEW OF SYSTEMS (Negative unless  checked)  Constitutional: [] Weight loss  [] Fever  [] Chills Cardiac: [] Chest pain   [] Chest pressure   [] Palpitations   [] Shortness of breath when laying flat   [] Shortness of breath at rest   [] Shortness of breath with exertion. Vascular:  [] Pain in legs with walking   [] Pain in legs at rest   [] Pain in legs when laying flat   [] Claudication   [] Pain in feet when walking  [] Pain in feet at rest  [] Pain in feet when laying flat   [] History of DVT   [] Phlebitis   [] Swelling in legs   [] Varicose veins   [] Non-healing ulcers Pulmonary:   [] Uses home oxygen   [] Productive cough   [] Hemoptysis   [] Wheeze  [] COPD   [] Asthma Neurologic:  [] Dizziness  [] Blackouts   [] Seizures   [] History of stroke   [] History of TIA  [] Aphasia   [] Temporary blindness   [] Dysphagia   [] Weakness or numbness in arms   [] Weakness or  numbness in legs Musculoskeletal:  [] Arthritis   [] Joint swelling   [] Joint pain   [] Low back pain Hematologic:  [] Easy bruising  [] Easy bleeding   [] Hypercoagulable state   [] Anemic  [] Hepatitis Gastrointestinal:  [] Blood in stool   [] Vomiting blood  [] Gastroesophageal reflux/heartburn   [] Difficulty swallowing. Genitourinary:  [] Chronic kidney disease   [] Difficult urination  [] Frequent urination  [] Burning with urination   [] Blood in urine Skin:  [] Rashes   [] Ulcers   [] Wounds Psychological:  [] History of anxiety   []  History of major depression.   Physical Examination  Vitals:   12/14/17 1506 12/14/17 1507  BP: 102/63 117/65  Pulse: 77 68  Resp: 16   Weight: 103.4 kg (228 lb)   Height: 5\' 9"  (1.753 m)    Body mass index is 33.67 kg/m. Gen:  WD/WN, NAD. Appears younger than stated age. Head: [place]/AT, No temporalis wasting. Ear/Nose/Throat: Hearing grossly intact, nares w/o erythema or drainage, trachea midline Eyes: Conjunctiva clear. Sclera non-icteric Neck: Supple.  No bruit  Pulmonary:  Good air movement, equal and clear to auscultation bilaterally.  Cardiac: RRR, No JVD Vascular:  Vessel Right Left  Radial Palpable Palpable                                    Musculoskeletal: M/S 5/5 throughout.  No deformity or atrophy. No edema. Neurologic: CN 2-12 intact. Sensation grossly intact in extremities.  Symmetrical.  Speech is fluent. Motor exam as listed above. Psychiatric: Judgment intact, Mood & affect appropriate for pt's clinical situation. Dermatologic: No rashes or ulcers noted.  No cellulitis or open wounds.      CBC Lab Results  Component Value Date   WBC 6.1 12/06/2017   HGB 13.5 12/06/2017   HCT 39.3 (L) 12/06/2017   MCV 95.0 12/06/2017   PLT 142 (L) 12/06/2017    BMET    Component Value Date/Time   NA 140 12/06/2017 1447   NA 139 06/09/2017 0915   NA 135 (L) 09/05/2014 0504   K 3.4 (L) 12/06/2017 1447   K 3.8 09/05/2014 0504   CL 110  12/06/2017 1447   CL 98 09/05/2014 0504   CO2 25 12/06/2017 1447   CO2 32 09/05/2014 0504   GLUCOSE 132 (H) 12/06/2017 1447   GLUCOSE 108 (H) 09/05/2014 0504   BUN 28 (H) 12/06/2017 1447   BUN 17 06/09/2017 0915   BUN 26 (H) 09/05/2014 0504   CREATININE 1.16  12/06/2017 1447   CREATININE 1.36 (H) 09/05/2014 0504   CALCIUM 9.1 12/06/2017 1447   CALCIUM 7.8 (L) 09/05/2014 0504   GFRNONAA >60 12/06/2017 1447   GFRNONAA 56 (L) 09/05/2014 0504   GFRNONAA 57 (L) 07/28/2012 0409   GFRAA >60 12/06/2017 1447   GFRAA >60 09/05/2014 0504   GFRAA >60 07/28/2012 0409   Estimated Creatinine Clearance: 70.2 mL/min (by C-G formula based on SCr of 1.16 mg/dL).  COAG Lab Results  Component Value Date   INR 0.9 09/02/2014    Radiology Dg Chest 2 View  Result Date: 12/06/2017 CLINICAL DATA:  Shortness of breath and chest pain EXAM: CHEST - 2 VIEW COMPARISON:  05/20/2017 FINDINGS: No acute pulmonary infiltrate or effusion. Cardiomediastinal silhouette within normal limits. Aortic atherosclerosis. No pneumothorax. Degenerative changes of the spine. Surgical clips in the right upper quadrant. IMPRESSION: No active cardiopulmonary disease. Electronically Signed   By: Donavan Foil M.D.   On: 12/06/2017 15:07      Assessment/Plan Hyperlipidemia lipid control important in reducing the progression of atherosclerotic disease. Continue statin therapy   Hypertension blood pressure control important in reducing the progression of atherosclerotic disease. On appropriate oral medications.    Carotid stenosis His carotid duplex today shows better velocities than in his previous study with velocities in the 1 to 39% range bilaterally. No intervention necessary at this time.  Continue current medical regimen.  Recheck in 1 year.    Leotis Pain, MD  12/15/2017 9:50 AM    This note was created with Dragon medical transcription system.  Any errors from dictation are purely unintentional

## 2017-12-15 NOTE — Assessment & Plan Note (Signed)
His carotid duplex today shows better velocities than in his previous study with velocities in the 1 to 39% range bilaterally. No intervention necessary at this time.  Continue current medical regimen.  Recheck in 1 year.

## 2017-12-16 ENCOUNTER — Encounter: Payer: Medicare Other | Attending: Internal Medicine

## 2017-12-16 DIAGNOSIS — Z955 Presence of coronary angioplasty implant and graft: Secondary | ICD-10-CM | POA: Diagnosis not present

## 2017-12-16 NOTE — Progress Notes (Signed)
Daily Session Note  Patient Details  Name: Darrell [name] MRN: [id number] Date of Birth: [birth year] Referring Provider:     Cardiac Rehab from 08/26/2017 in [facility]  Referring Provider  [name]      Encounter Date: 12/16/2017  Check In: Session Check In - 12/16/17 0831      Check-In   Location  [facility]-Cardiac & Pulmonary Rehab    Staff Present  Alberteen Sam, MA, RCEP, CCRP, Exercise Physiologist;Amanda Oletta Darter, BA, ACSM CEP, Exercise Physiologist;Carroll Enterkin, RN, BSN    Supervising physician immediately available to respond to emergencies  See telemetry face sheet for immediately available ER MD    Medication changes reported      No    Fall or balance concerns reported     No    Warm-up and Cool-down  Performed on first and last piece of equipment    Resistance Training Performed  Yes    VAD Patient?  No      Pain Assessment   Currently in Pain?  No/denies          Social History   Tobacco Use  Smoking Status Former Smoker  . Types: Cigarettes  Smokeless Tobacco Never Used  Tobacco Comment   quit 30 years ago     Goals Met:  Independence with exercise equipment Exercise tolerated well Personal goals reviewed No report of cardiac concerns or symptoms Strength training completed today  Goals Unmet:  Not Applicable  Comments: Pt able to follow exercise prescription today without complaint.  Will continue to monitor for progression.    Dr. Emily Filbert is Medical Director for [place] and [facility].

## 2017-12-20 ENCOUNTER — Encounter: Payer: Self-pay | Admitting: Family Medicine

## 2017-12-20 ENCOUNTER — Ambulatory Visit (INDEPENDENT_AMBULATORY_CARE_PROVIDER_SITE_OTHER): Payer: Medicare Other | Admitting: Family Medicine

## 2017-12-20 VITALS — BP 114/70 | HR 68 | Ht 68.0 in | Wt 227.0 lb

## 2017-12-20 DIAGNOSIS — I6523 Occlusion and stenosis of bilateral carotid arteries: Secondary | ICD-10-CM

## 2017-12-20 DIAGNOSIS — I2510 Atherosclerotic heart disease of native coronary artery without angina pectoris: Secondary | ICD-10-CM

## 2017-12-20 DIAGNOSIS — R7303 Prediabetes: Secondary | ICD-10-CM | POA: Diagnosis not present

## 2017-12-20 DIAGNOSIS — I10 Essential (primary) hypertension: Secondary | ICD-10-CM

## 2017-12-20 DIAGNOSIS — I200 Unstable angina: Secondary | ICD-10-CM | POA: Diagnosis not present

## 2017-12-20 DIAGNOSIS — E782 Mixed hyperlipidemia: Secondary | ICD-10-CM | POA: Diagnosis not present

## 2017-12-20 DIAGNOSIS — Z1329 Encounter for screening for other suspected endocrine disorder: Secondary | ICD-10-CM

## 2017-12-20 DIAGNOSIS — K921 Melena: Secondary | ICD-10-CM

## 2017-12-20 DIAGNOSIS — Z125 Encounter for screening for malignant neoplasm of prostate: Secondary | ICD-10-CM

## 2017-12-20 DIAGNOSIS — Z7189 Other specified counseling: Secondary | ICD-10-CM | POA: Insufficient documentation

## 2017-12-20 DIAGNOSIS — Z0001 Encounter for general adult medical examination with abnormal findings: Secondary | ICD-10-CM

## 2017-12-20 LAB — URINALYSIS, ROUTINE W REFLEX MICROSCOPIC
BILIRUBIN UA: NEGATIVE
GLUCOSE, UA: NEGATIVE
KETONES UA: NEGATIVE
Leukocytes, UA: NEGATIVE
NITRITE UA: NEGATIVE
Protein, UA: NEGATIVE
RBC, UA: NEGATIVE
Specific Gravity, UA: <1.005 — ABNORMAL LOW (ref 1.005–1.030)
UUROB: 0.2 mg/dL (ref 0.2–1.0)
pH, UA: 5 (ref 5.0–7.5)

## 2017-12-20 MED ORDER — HYDROCHLOROTHIAZIDE 25 MG PO TABS: 25.0000 mg | ORAL_TABLET | Freq: Every day | ORAL | 4 refills | Status: DC

## 2017-12-20 MED ORDER — ALLOPURINOL 300 MG PO TABS: 150.0000 mg | ORAL_TABLET | Freq: Every day | ORAL | 4 refills | Status: DC

## 2017-12-20 NOTE — Assessment & Plan Note (Signed)
Continue adjustments and changes as recommended by cardiology to observe response

## 2017-12-20 NOTE — Progress Notes (Signed)
BP 114/70   Pulse 68   Ht 5\' 8"  (1.727 m)   Wt 227 lb (103 kg)   SpO2 99%   BMI 34.52 kg/m    Subjective:    Patient ID: Darrell [name], male    DOB: 31-Oct-1945, 72 y.o.   MRN: [id number]  HPI: Darrell [name] is a 72 y.o. male  Annual Exam  Patient follow-up complicated history with coronary artery disease followed by cardiology at [facility] and here.  Adjusting blood pressure medicines and observing angina response. Patient's blood pressures come down nicely but may be down to much is has some dizzy lightheaded spells helped with drinking lots of water. Good reports from urology. Updated medication list and stable otherwise. GI consult recommended because of history of GI bleeding on.  Patient now taking Eliquis and Plavix no GI bleeding but concerned about possibilities will go ahead with referral. Relevant past medical, surgical, family and social history reviewed and updated as indicated. Interim medical history since our last visit reviewed. Allergies and medications reviewed and updated.  Review of Systems  Constitutional: Negative.   HENT: Negative.   Eyes: Negative.   Respiratory: Negative.   Cardiovascular: Negative.   Gastrointestinal: Negative.   Endocrine: Negative.   Genitourinary: Negative.   Musculoskeletal: Negative.   Skin: Negative.   Allergic/Immunologic: Negative.   Neurological: Negative.   Hematological: Negative.   Psychiatric/Behavioral: Negative.     Per HPI unless specifically indicated above     Objective:    BP 114/70   Pulse 68   Ht 5\' 8"  (1.727 m)   Wt 227 lb (103 kg)   SpO2 99%   BMI 34.52 kg/m   Wt Readings from Last 3 Encounters:  12/20/17 227 lb (103 kg)  12/14/17 228 lb (103.4 kg)  12/06/17 230 lb (104.3 kg)    Physical Exam  Constitutional: He is oriented to person, place, and time. He appears well-developed and well-nourished.  HENT:  Head: Normocephalic.  Right Ear: External ear normal.  Left Ear:  External ear normal.  Nose: Nose normal.  Eyes: Pupils are equal, round, and reactive to light. Conjunctivae and EOM are normal.  Neck: Normal range of motion. Neck supple. No thyromegaly present.  Cardiovascular: Normal rate, regular rhythm, normal heart sounds and intact distal pulses.  Pulmonary/Chest: Effort normal and breath sounds normal.  Abdominal: Soft. Bowel sounds are normal. There is no splenomegaly or hepatomegaly.  Genitourinary: Penis normal.  Genitourinary Comments: Done at urology  Musculoskeletal: Normal range of motion.  Lymphadenopathy:    He has no cervical adenopathy.  Neurological: He is alert and oriented to person, place, and time. He has normal reflexes.  Skin: Skin is warm and dry.  Psychiatric: He has a normal mood and affect. His behavior is normal. Judgment and thought content normal.    Results for orders placed or performed in visit on 12/20/17  Urinalysis, Routine w reflex microscopic  Result Value Ref Range   Specific Gravity, UA <1.005 (L) 1.005 - 1.030   pH, UA 5.0 5.0 - 7.5   Color, UA Yellow Yellow   Appearance Ur Clear Clear   Leukocytes, UA Negative Negative   Protein, UA Negative Negative/Trace   Glucose, UA Negative Negative   Ketones, UA Negative Negative   RBC, UA Negative Negative   Bilirubin, UA Negative Negative   Urobilinogen, Ur 0.2 0.2 - 1.0 mg/dL   Nitrite, UA Negative Negative      Assessment & Plan:   Problem  List Items Addressed This Visit      Cardiovascular and Mediastinum   Hypertension - Primary    Hypertension possibly getting too low resulting in patient's lightheaded dizzy spells will decrease hydrochlorothiazide from 25 mg to 12.5 mg observe blood pressure recheck in 1 month or so.      Relevant Medications   hydrochlorothiazide (HYDRODIURIL) 25 MG tablet   Other Relevant Orders   Comprehensive metabolic panel   Carotid stenosis   Relevant Medications   hydrochlorothiazide (HYDRODIURIL) 25 MG tablet    Coronary artery disease    Continue adjustments and changes as recommended by cardiology to observe response      Relevant Medications   hydrochlorothiazide (HYDRODIURIL) 25 MG tablet     Other   Hyperlipidemia    The current medical regimen is effective;  continue present plan and medications.       Relevant Medications   hydrochlorothiazide (HYDRODIURIL) 25 MG tablet   Other Relevant Orders   Comprehensive metabolic panel   Prediabetes   Relevant Orders   Comprehensive metabolic panel   Advanced care planning/counseling discussion    A voluntary discussion about advance care planning including the explanation and discussion of advance directives was extensively discussed  with the patient.  Explanation about the health care proxy and Living will was reviewed and packet with forms with explanation of how to fill them out was given. .  Time spent:16+ min        Individuals present:Pt        Other Visit Diagnoses    Thyroid disorder screen       Prostate cancer screening       Gastrointestinal hemorrhage with melena       Relevant Orders   Ambulatory referral to Gastroenterology       Follow up plan: Return in about 2 months (around 02/19/2018).

## 2017-12-20 NOTE — Assessment & Plan Note (Signed)
Hypertension possibly getting too low resulting in patient's lightheaded dizzy spells will decrease hydrochlorothiazide from 25 mg to 12.5 mg observe blood pressure recheck in 1 month or so.

## 2017-12-20 NOTE — Assessment & Plan Note (Signed)
A voluntary discussion about advance care planning including the explanation and discussion of advance directives was extensively discussed  with the patient.  Explanation about the health care proxy and Living will was reviewed and packet with forms with explanation of how to fill them out was given. .  Time spent:16+ min        Individuals present:Pt

## 2017-12-20 NOTE — Assessment & Plan Note (Signed)
The current medical regimen is effective;  continue present plan and medications.  

## 2017-12-21 ENCOUNTER — Telehealth: Payer: Self-pay | Admitting: Family Medicine

## 2017-12-21 DIAGNOSIS — Z955 Presence of coronary angioplasty implant and graft: Secondary | ICD-10-CM

## 2017-12-21 LAB — LIPID PANEL
CHOLESTEROL TOTAL: 134 mg/dL (ref 100–199)
Chol/HDL Ratio: 3.4 ratio (ref 0.0–5.0)
HDL: 39 mg/dL — ABNORMAL LOW (ref >39)
LDL Calculated: 44 mg/dL (ref 0–99)
Triglycerides: 257 mg/dL — ABNORMAL HIGH (ref 0–149)
VLDL CHOLESTEROL CAL: 51 mg/dL — AB (ref 5–40)

## 2017-12-21 LAB — COMPREHENSIVE METABOLIC PANEL
A/G RATIO: 1.8 (ref 1.2–2.2)
ALBUMIN: 4.2 g/dL (ref 3.5–4.8)
ALK PHOS: 123 IU/L — AB (ref 39–117)
ALT: 24 IU/L (ref 0–44)
AST: 24 IU/L (ref 0–40)
BILIRUBIN TOTAL: 0.9 mg/dL (ref 0.0–1.2)
BUN / CREAT RATIO: 19 (ref 10–24)
BUN: 28 mg/dL — AB (ref 8–27)
CHLORIDE: 98 mmol/L (ref 96–106)
CO2: 23 mmol/L (ref 20–29)
Calcium: 9.7 mg/dL (ref 8.6–10.2)
Creatinine, Ser: 1.48 mg/dL — ABNORMAL HIGH (ref 0.76–1.27)
GFR calc Af Amer: 55 mL/min/{1.73_m2} — ABNORMAL LOW (ref >59)
GFR calc non Af Amer: 47 mL/min/{1.73_m2} — ABNORMAL LOW (ref >59)
Globulin, Total: 2.3 g/dL (ref 1.5–4.5)
Glucose: 88 mg/dL (ref 65–99)
POTASSIUM: 4.3 mmol/L (ref 3.5–5.2)
Sodium: 137 mmol/L (ref 134–144)
Total Protein: 6.5 g/dL (ref 6.0–8.5)

## 2017-12-21 LAB — CBC WITH DIFFERENTIAL/PLATELET
BASOS ABS: 0 10*3/uL (ref 0.0–0.2)
BASOS: 0 %
EOS (ABSOLUTE): 0.2 10*3/uL (ref 0.0–0.4)
Eos: 3 %
HEMOGLOBIN: 13.2 g/dL (ref 13.0–17.7)
Hematocrit: 38.8 % (ref 37.5–51.0)
IMMATURE GRANS (ABS): 0 10*3/uL (ref 0.0–0.1)
Immature Granulocytes: 0 %
LYMPHS: 29 %
Lymphocytes Absolute: 2.1 10*3/uL (ref 0.7–3.1)
MCH: 32.3 pg (ref 26.6–33.0)
MCHC: 34 g/dL (ref 31.5–35.7)
MCV: 95 fL (ref 79–97)
MONOCYTES: 8 %
Monocytes Absolute: 0.5 10*3/uL (ref 0.1–0.9)
NEUTROS ABS: 4.2 10*3/uL (ref 1.4–7.0)
NEUTROS PCT: 60 %
PLATELETS: 185 10*3/uL (ref 150–379)
RBC: 4.09 x10E6/uL — ABNORMAL LOW (ref 4.14–5.80)
RDW: 13.7 % (ref 12.3–15.4)
WBC: 7 10*3/uL (ref 3.4–10.8)

## 2017-12-21 LAB — PSA: Prostate Specific Ag, Serum: 3.6 ng/mL (ref 0.0–4.0)

## 2017-12-21 LAB — TSH: TSH: 2.54 u[IU]/mL (ref 0.450–4.500)

## 2017-12-21 NOTE — Progress Notes (Signed)
Daily Session Note  Patient Details  Name: OCTAVIANO MUKAI MRN: [id number] Date of Birth: 30-Nov-1945 Referring Provider:     Cardiac Rehab from 08/26/2017 in [facility]  Referring Provider  [name]      Encounter Date: 12/21/2017  Check In: Session Check In - 12/21/17 0827      Check-In   Location  [facility]-Cardiac & Pulmonary Re[facility]    Staff Present  Alberteen Sam, MA, RCEP, CCRP, Exercise Physiologist;Amanda Oletta Darter, BA, ACSM CEP, Exercise Physiologist;Susanne Bice, RN, BSN, CCRP    Supervising physician immediately available to respond to emergencies  See telemetry face sheet for immediately available ER MD    Medication changes reported      No    Fall or balance concerns reported     No    Warm-up and Cool-down  Performed on first and last piece of equipment    Resistance Training Performed  Yes    VAD Patient?  No      Pain Assessment   Currently in Pain?  No/denies    Multiple Pain Sites  No          Social History   Tobacco Use  Smoking Status Former Smoker  . Types: Cigarettes  Smokeless Tobacco Never Used  Tobacco Comment   quit 30 years ago     Goals Met:  Independence with exercise equipment Exercise tolerated well No report of cardiac concerns or symptoms Strength training completed today  Goals Unmet:  Not Applicable  Comments: Pt able to follow exercise prescription today without complaint.  Will continue to monitor for progression.    Dr. Emily Filbert is Medical Director for [place] and [facility].

## 2017-12-21 NOTE — Telephone Encounter (Signed)
Phone call Discuss normal blood work except for slight decline in renal function will recheck next office visit in 1 month.

## 2017-12-21 NOTE — Patient Instructions (Signed)
Discharge Patient Instructions  Patient Details  Name: Darrell [name] MRN: [id number] Date of Birth: [birth year] Referring Provider:  Corey Skains, MD   Number of Visits: 36/36  Reason for Discharge:  Patient reached a stable level of exercise. Patient independent in their exercise. Patient has met program and personal goals.  Smoking History:  Social History   Tobacco Use  Smoking Status Former Smoker  . Types: Cigarettes  Smokeless Tobacco Never Used  Tobacco Comment   quit 30 years ago     Diagnosis:  Status post coronary artery stent placement  Initial Exercise Prescription: Initial Exercise Prescription - 08/26/17 1400      Date of Initial Exercise RX and Referring Provider   Date  08/26/17    Referring Provider  [name]      Treadmill   MPH  2.8    Grade  0.5    Minutes  15    METs  3.34      Recumbant Bike   Level  3    RPM  60    Watts  35    Minutes  15    METs  3      Elliptical   Level  1    Speed  3    Minutes  15      T5 Nustep   Level  2    SPM  80    Minutes  15    METs  3      Prescription Details   Frequency (times per week)  3    Duration  Progress to 30 minutes of continuous aerobic without signs/symptoms of physical distress      Intensity   THRR 40-80% of Max Heartrate  112-137    Ratings of Perceived Exertion  11-13    Perceived Dyspnea  0-4      Resistance Training   Training Prescription  Yes    Weight  5 lb    Reps  10-15       Discharge Exercise Prescription (Final Exercise Prescription Changes): Exercise Prescription Changes - 12/14/17 1500      Response to Exercise   Blood Pressure (Admit)  130/60    Blood Pressure (Exercise)  134/74    Blood Pressure (Exit)  112/58    Heart Rate (Admit)  78 bpm    Heart Rate (Exercise)  117 bpm    Heart Rate (Exit)  76 bpm    Rating of Perceived Exertion (Exercise)  12    Symptoms  none    Duration  Continue with 30 min of aerobic exercise without  signs/symptoms of physical distress.    Intensity  THRR unchanged      Progression   Progression  Continue to progress workloads to maintain intensity without signs/symptoms of physical distress.    Average METs  4.19      Resistance Training   Training Prescription  Yes    Weight  7 lbs    Reps  10-15      Interval Training   Interval Training  No      Treadmill   MPH  3.5    Grade  2    Minutes  15    METs  4.64      Recumbant Bike   Level  6    Watts  54    Minutes  15    METs  3.73      Home Exercise Plan   Plans to continue exercise  at  [employer] (comment) [employer]    Frequency  Add 3 additional days to program exercise sessions.    Initial Home Exercises Provided  09/09/17       Functional Capacity: 6 Minute Walk    Row Name 08/26/17 1427 11/25/17 1148       6 Minute Walk   Phase  -  Discharge    Distance  1600 feet  1760 feet    Distance % Change  -  10 %    Distance Feet Change  -  1600 ft    Walk Time  6 minutes  6 minutes    # of Rest Breaks  0  0    MPH  3.03  3.33    METS  2.96  3.27    RPE  8  12    Perceived Dyspnea   0  -    VO2 Peak  10.37  11.44    Symptoms  No  No    Resting HR  88 bpm  65 bpm    Resting BP  110/64  136/70    Resting Oxygen Saturation   95 %  -    Max Ex. HR  103 bpm  102 bpm    Max Ex. BP  116/68  156/70    2 Minute Post BP  110/62  -       Quality of Life: Quality of Life - 12/16/17 0833      Quality of Life Scores   Health/Function Pre  16.27 %    Health/Function Post  9.6 %    Health/Function % Change  -41 %    Socioeconomic Pre  20.38 %    Socioeconomic Post  21.21 %    Socioeconomic % Change   4.07 %    Psych/Spiritual Pre  17.64 %    Psych/Spiritual Post  14.57 %    Psych/Spiritual % Change  -17.4 %    Family Pre  24 %    Family Post  25.2 %    Family % Change  5 %    GLOBAL Pre  18.59 %    GLOBAL Post  15.31 %    GLOBAL % Change  -17.64 %       Personal Goals: Goals  established at orientation with interventions provided to work toward goal.    Personal Goals Discharge: Goals and Risk Factor Review - 12/16/17 0931      Core Components/Risk Factors/Patient Goals Review   Personal Goals Review  Weight Management/Obesity;Hypertension;Lipids    Review  Darrell [name] will be graduating next week.  He wants to continue to focus on losing weight and plans to continue to monitor his blood pressures regularly.     Expected Outcomes  Short: Graduate.  Long: Continue to work on weight loss and monitor pressures.        Exercise Goals and Review: Exercise Goals    Row Name 08/26/17 1426             Exercise Goals   Increase Physical Activity  Yes       Intervention  Provide advice, education, support and counseling about physical activity/exercise needs.;Develop an individualized exercise prescription for aerobic and resistive training based on initial evaluation findings, risk stratification, comorbidities and participant's personal goals.       Expected Outcomes  Achievement of increased cardiorespiratory fitness and enhanced flexibility, muscular endurance and strength shown through measurements of functional capacity and personal statement of participant.  Increase Strength and Stamina  Yes       Intervention  Provide advice, education, support and counseling about physical activity/exercise needs.;Develop an individualized exercise prescription for aerobic and resistive training based on initial evaluation findings, risk stratification, comorbidities and participant's personal goals.       Expected Outcomes  Achievement of increased cardiorespiratory fitness and enhanced flexibility, muscular endurance and strength shown through measurements of functional capacity and personal statement of participant.       Able to understand and use rate of perceived exertion (RPE) scale  Yes       Intervention  Provide education and explanation on how to use RPE scale        Expected Outcomes  Short Term: Able to use RPE daily in rehab to express subjective intensity level;Long Term:  Able to use RPE to guide intensity level when exercising independently       Able to understand and use Dyspnea scale  Yes       Intervention  Provide education and explanation on how to use Dyspnea scale       Expected Outcomes  Short Term: Able to use Dyspnea scale daily in rehab to express subjective sense of shortness of breath during exertion;Long Term: Able to use Dyspnea scale to guide intensity level when exercising independently       Knowledge and understanding of Target Heart Rate Range (THRR)  Yes       Intervention  Provide education and explanation of THRR including how the numbers were predicted and where they are located for reference       Expected Outcomes  Short Term: Able to state/look up THRR;Long Term: Able to use THRR to govern intensity when exercising independently;Short Term: Able to use daily as guideline for intensity in rehab       Able to check pulse independently  Yes       Intervention  Provide education and demonstration on how to check pulse in carotid and radial arteries.;Review the importance of being able to check your own pulse for safety during independent exercise       Expected Outcomes  Short Term: Able to explain why pulse checking is important during independent exercise;Long Term: Able to check pulse independently and accurately       Understanding of Exercise Prescription  Yes       Intervention  Provide education, explanation, and written materials on patient's individual exercise prescription       Expected Outcomes  Short Term: Able to explain program exercise prescription;Long Term: Able to explain home exercise prescription to exercise independently          Nutrition & Weight - Outcomes: Pre Biometrics - 08/26/17 1425      Pre Biometrics   Height  5' 7.5" (1.715 m)    Weight  223 lb 14.4 oz (101.6 kg)    Waist Circumference  44  inches    Hip Circumference  45 inches    Waist to Hip Ratio  0.98 %    BMI (Calculated)  34.53    Single Leg Stand  30 seconds      Post Biometrics - 11/25/17 1149       Post  Biometrics   Height  5' 8"  (1.727 m)    Weight  230 lb (104.3 kg)    Waist Circumference  44 inches    Hip Circumference  43 inches    Waist to Hip Ratio  1.02 %    BMI (  Calculated)  34.98    Single Leg Stand  30 seconds       Nutrition: Nutrition Therapy & Goals - 09/08/17 1535      Nutrition Therapy   Diet  TLC    Protein (specify units)  1-4oz each meal    Saturated Fats  15 max. grams    Fruits and Vegetables  5 servings/day    Sodium  1500 grams      Personal Nutrition Goals   Personal Goal #2  Continue to work on gradual changes to control food portions and make heart healthy (low saturated fat, high fiber/veg/fruit) food choices.     Additional Goals?  No    Comments  Darrell [name] feels he is doing well with diet, other than some struggle to lose weight. He has reduced food portions and plans to continue working to control caloric intake.        Nutrition Discharge: Nutrition Assessments - 12/16/17 0832      MEDFICTS Scores   Pre Score  40    Post Score  19    Score Difference  -21       Education Questionnaire Score: Knowledge Questionnaire Score - 12/16/17 0832      Knowledge Questionnaire Score   Pre Score  24/28    Post Score  27/28       Goals reviewed with patient; copy given to patient.

## 2017-12-21 NOTE — Telephone Encounter (Signed)
-----   Message from Amada Kingfisher, [place] sent at 12/21/2017 11:59 AM EDT ----- Patient was transferred to provider for telephone conversation.

## 2017-12-24 ENCOUNTER — Telehealth: Payer: Self-pay | Admitting: Family Medicine

## 2017-12-24 ENCOUNTER — Encounter: Payer: Self-pay | Admitting: Gastroenterology

## 2017-12-24 NOTE — Telephone Encounter (Signed)
Dr Allen Norris can change the reason he is having the colonoscopy, but if he's having bleeding- that is the reason that we have to put as the reason he is going to see Dr. Allen Norris.

## 2017-12-24 NOTE — Telephone Encounter (Signed)
Copied from [place] [id number]. Topic: Referral - Request >> Dec 24, 2017  3:55 PM Robina Ade, Helene Kelp D wrote: Reason for CRM: Patient called and said that he needs Dr. Jeananne Rama to change the reason of his colonoscopy. He wants it to be changed because he is due for one not because of any bleeding. If any questions feel free to call patient back, thanks.

## 2017-12-24 NOTE — Telephone Encounter (Signed)
Message relayed to patient. Verbalized understanding and denied questions.   

## 2017-12-27 NOTE — Telephone Encounter (Signed)
Wife states pt is not having bleeding, just hx of bleeding. Per chart (do not see report) pt had colonoscopy in 2015. Advised wife I was still unsure DX can be changed based to Dr. Rance Muir notes. Please advise.

## 2017-12-27 NOTE — Telephone Encounter (Signed)
Colonoscopy referral updated to include both diagnosis.

## 2017-12-27 NOTE — Telephone Encounter (Signed)
Patient and husband made aware. They are going to attempt to call and schedule colonoscopy. They stated they were unable to last week due to the way it was ordered.

## 2017-12-27 NOTE — Telephone Encounter (Signed)
OK to add screening colonoscopy to the referral

## 2017-12-27 NOTE — Telephone Encounter (Signed)
Pt wife is calling she would like a new referral for colonoscopy for  routine colonoscopy. Pt is not having any problems

## 2018-01-04 ENCOUNTER — Encounter: Payer: Medicare Other | Admitting: *Deleted

## 2018-01-04 DIAGNOSIS — Z955 Presence of coronary angioplasty implant and graft: Secondary | ICD-10-CM

## 2018-01-04 NOTE — Progress Notes (Signed)
Cardiac Individual Treatment Plan  Patient Details  Name: Darrell [name] MRN: [id number] Date of Birth: Apr 22, 1946 Referring Provider:     Cardiac Rehab from 08/26/2017 in [facility] Cardiac and Pulmonary Rehab  Referring Provider  [name]      Initial Encounter Date:    Cardiac Rehab from 08/26/2017 in [facility] Cardiac and Pulmonary Rehab  Date  08/26/17  Referring Provider  [name]      Visit Diagnosis: Status post coronary artery stent placement  Patient's Home Medications on Admission:  Current Outpatient Medications:  .  allopurinol (ZYLOPRIM) 300 MG tablet, Take 0.5 tablets (150 mg total) by mouth daily., Disp: 90 tablet, Rfl: 4 .  apixaban (ELIQUIS) 5 MG TABS tablet, Take by mouth., Disp: , Rfl:  .  aspirin EC 81 MG EC tablet, Take 1 tablet (81 mg total) by mouth daily. (Patient not taking: Reported on 12/14/2017), Disp: , Rfl:  .  atorvastatin (LIPITOR) 80 MG tablet, Take 80 mg by mouth daily., Disp: , Rfl:  .  carvedilol (COREG) 3.125 MG tablet, , Disp: , Rfl: 0 .  clopidogrel (PLAVIX) 75 MG tablet, Take 75 mg by mouth daily., Disp: , Rfl:  .  hydrochlorothiazide (HYDRODIURIL) 25 MG tablet, Take 1 tablet (25 mg total) by mouth daily., Disp: 90 tablet, Rfl: 4 .  isosorbide mononitrate (IMDUR) 30 MG 24 hr tablet, Take by mouth., Disp: , Rfl:  .  naproxen (NAPROSYN) 500 MG tablet, Take 1 tablet (500 mg total) 2 (two) times daily with a meal by mouth., Disp: 10 tablet, Rfl: 0 .  nitroGLYCERIN (NITROSTAT) 0.4 MG SL tablet, Place under the tongue., Disp: , Rfl:  .  telmisartan (MICARDIS) 40 MG tablet, Take 40 mg by mouth daily., Disp: , Rfl:   Past Medical History: Past Medical History:  Diagnosis Date  . CAD (coronary artery disease)   . Cancer [facility])    prostate  . Carotid artery stenosis   . Hyperlipidemia   . Hypertension   . Psoriasis     Tobacco Use: Social History   Tobacco Use  Smoking Status Former Smoker  . Types: Cigarettes  Smokeless Tobacco Never Used   Tobacco Comment   quit 30 years ago     Labs: Recent Review Flowsheet Data    Labs for ITP Cardiac and Pulmonary Rehab Latest Ref Rng & Units 11/18/2016 12/17/2016 05/20/2017 06/09/2017 12/20/2017   Cholestrol 100 - 199 mg/dL 112 104 - 106 134   LDLCALC 0 - 99 mg/dL - 39 - 42 44   HDL >39 mg/dL - 41 - 41 39(L)   Trlycerides 0 - 149 mg/dL 264(H) 122 - 114 257(H)   Hemoglobin A1c 4.8 - 5.6 % - - 5.6 - -       Exercise Target Goals:    Exercise Program Goal: Individual exercise prescription set using results from initial 6 min walk test and THRR while considering  patient's activity barriers and safety.   Exercise Prescription Goal: Initial exercise prescription builds to 30-45 minutes a day of aerobic activity, 2-3 days per week.  Home exercise guidelines will be given to patient during program as part of exercise prescription that the participant will acknowledge.  Activity Barriers & Risk Stratification:   6 Minute Walk: 6 Minute Walk    Row Name 08/26/17 1427 11/25/17 1148       6 Minute Walk   Phase  -  Discharge    Distance  1600 feet  1760 feet    Distance % Change  -  10 %    Distance Feet Change  -  1600 ft    Walk Time  6 minutes  6 minutes    # of Rest Breaks  0  0    MPH  3.03  3.33    METS  2.96  3.27    RPE  8  12    Perceived Dyspnea   0  -    VO2 Peak  10.37  11.44    Symptoms  No  No    Resting HR  88 bpm  65 bpm    Resting BP  110/64  136/70    Resting Oxygen Saturation   95 %  -    Max Ex. HR  103 bpm  102 bpm    Max Ex. BP  116/68  156/70    2 Minute Post BP  110/62  -       Oxygen Initial Assessment:   Oxygen Re-Evaluation:   Oxygen Discharge (Final Oxygen Re-Evaluation):   Initial Exercise Prescription: Initial Exercise Prescription - 08/26/17 1400      Date of Initial Exercise RX and Referring Provider   Date  08/26/17    Referring Provider  [name]      Treadmill   MPH  2.8    Grade  0.5    Minutes  15    METs  3.34       Recumbant Bike   Level  3    RPM  60    Watts  35    Minutes  15    METs  3      Elliptical   Level  1    Speed  3    Minutes  15      T5 Nustep   Level  2    SPM  80    Minutes  15    METs  3      Prescription Details   Frequency (times per week)  3    Duration  Progress to 30 minutes of continuous aerobic without signs/symptoms of physical distress      Intensity   THRR 40-80% of Max Heartrate  112-137    Ratings of Perceived Exertion  11-13    Perceived Dyspnea  0-4      Resistance Training   Training Prescription  Yes    Weight  5 lb    Reps  10-15       Perform Capillary Blood Glucose checks as needed.  Exercise Prescription Changes: Exercise Prescription Changes    Row Name 08/26/17 1400 09/06/17 1700 09/09/17 1000 09/22/17 1500 10/05/17 1600     Response to Exercise   Blood Pressure (Admit)  110/64  128/80  -  116/62  134/68   Blood Pressure (Exercise)  116/68  138/64  -  128/70  124/70   Blood Pressure (Exit)  110/62  114/68  -  116/64  124/60   Heart Rate (Admit)  88 bpm  56 bpm  -  88 bpm  83 bpm   Heart Rate (Exercise)  103 bpm  130 bpm  -  136 bpm  110 bpm   Heart Rate (Exit)  84 bpm  82 bpm  -  87 bpm  93 bpm   Oxygen Saturation (Admit)  95 %  -  -  -  -   Rating of Perceived Exertion (Exercise)  8  12  -  12  12   Symptoms  -  none  -  none  none   Comments  -  first full day of exercise  -  -  -   Duration  -  Continue with 30 min of aerobic exercise without signs/symptoms of physical distress.  -  Continue with 30 min of aerobic exercise without signs/symptoms of physical distress.  Continue with 30 min of aerobic exercise without signs/symptoms of physical distress.   Intensity  -  THRR unchanged  -  THRR unchanged  THRR unchanged     Progression   Progression  -  Continue to progress workloads to maintain intensity without signs/symptoms of physical distress.  -  Continue to progress workloads to maintain intensity without signs/symptoms of  physical distress.  Continue to progress workloads to maintain intensity without signs/symptoms of physical distress.   Average METs  -  3.34  -  4.64  4.25     Resistance Training   Training Prescription  -  Yes  -  Yes  Yes   Weight  -  5 lb  -  7 lbs  7 lbs   Reps  -  10-15  -  10-15  10-15     Interval Training   Interval Training  -  No  -  No  No     Treadmill   MPH  -  2.8  -  3.5  3.5   Grade  -  0.5  -  2  2   Minutes  -  15  -  15  15   METs  -  3.34  -  4.64  4.64     Recumbant Bike   Level  -  -  -  -  6   Watts  -  -  -  -  57   Minutes  -  -  -  -  15   METs  -  -  -  -  3.89     Elliptical   Level  -  1  -  1  1   Speed  -  3  -  3  3   Minutes  -  15  -  15  15     Home Exercise Plan   Plans to continue exercise at  -  -  [employer] (comment) [employer] (comment) [employer] (comment) [employer]   Frequency  -  -  Add 3 additional days to program exercise sessions.  Add 3 additional days to program exercise sessions.  Add 3 additional days to program exercise sessions.   Initial Home Exercises Provided  -  -  09/09/17  09/09/17  09/09/17   Row Name 10/19/17 1600 11/02/17 1500 11/17/17 1400 11/30/17 1500 12/14/17 1500     Response to Exercise   Blood Pressure (Admit)  134/66  130/80  134/74  136/70  130/60   Blood Pressure (Exercise)  156/74  122/70  142/74  156/70  134/74   Blood Pressure (Exit)  124/72  126/74  108/66  110/58  112/58   Heart Rate (Admit)  69 bpm  90 bpm  66 bpm  65 bpm  78 bpm   Heart Rate (Exercise)  110 bpm  127 bpm  122 bpm  110 bpm  117 bpm   Heart Rate (Exit)  64 bpm  74 bpm  96 bpm  88 bpm  76 bpm   Rating of Perceived Exertion (Exercise)  13  12  12  12  12    Symptoms  none  none  none  none  none   Duration  Continue with 30 min of aerobic exercise without signs/symptoms of physical distress.  Continue with 30 min of aerobic exercise without signs/symptoms of physical  distress.  Continue with 30 min of aerobic exercise without signs/symptoms of physical distress.  Continue with 30 min of aerobic exercise without signs/symptoms of physical distress.  Continue with 30 min of aerobic exercise without signs/symptoms of physical distress.   Intensity  THRR unchanged  THRR unchanged  THRR unchanged  THRR unchanged  THRR unchanged     Progression   Progression  Continue to progress workloads to maintain intensity without signs/symptoms of physical distress.  Continue to progress workloads to maintain intensity without signs/symptoms of physical distress.  Continue to progress workloads to maintain intensity without signs/symptoms of physical distress.  Continue to progress workloads to maintain intensity without signs/symptoms of physical distress.  Continue to progress workloads to maintain intensity without signs/symptoms of physical distress.   Average METs  4.27  4.64  4  4  4.19     Resistance Training   Training Prescription  Yes  Yes  Yes  Yes  Yes   Weight  7 lbs  7 lbs  7 lbs  7 lbs  7 lbs   Reps  10-15  10-15  10-15  10-15  10-15     Interval Training   Interval Training  No  No  No  No  No     Treadmill   MPH  3.5  3.5  3.5  3.5  3.5   Grade  2  2  2  2  2    Minutes  15  15  15  15  15    METs  4.64  4.64  4.64  4.64  4.64     Recumbant Bike   Level  6  -  6  6  6    Watts  51  -  43  43  54   Minutes  15  -  15  15  15    METs  3.89  -  3.35  3.35  3.73     Elliptical   Level  -  1  1  1   -   Speed  -  2.5  2.5  3.2  -   Minutes  -  15  15  15   -     Home Exercise Plan   Plans to continue exercise at  [employer] (comment) [employer] (comment) [employer] (comment) [employer] (comment) [employer] (comment) [employer]   Frequency  Add 3 additional days to program exercise sessions.  Add 3 additional days to program exercise sessions.  Add 3  additional days to program exercise sessions.  Add 3 additional days to program exercise sessions.  Add 3 additional days to program exercise sessions.   Initial Home Exercises Provided  09/09/17  09/09/17  09/09/17  09/09/17  09/09/17      Exercise Comments: Exercise Comments    Row Name 08/31/17 0846 01/04/18 0836         Exercise Comments   First full day of exercise!  Patient was oriented to gym and equipment including functions, settings, policies, and procedures.  Patient's individual exercise prescription and treatment plan were reviewed.  All starting workloads were established based on the results  of the 6 minute walk test done at initial orientation visit.  The plan for exercise progression was also introduced and progression will be customized based on patient's performance and goals.   [name] graduated today from  rehab with 36 sessions completed.  Details of the patient's exercise prescription and what He needs to do in order to continue the prescription and progress were discussed with patient.  Patient was given a copy of prescription and goals.  Patient verbalized understanding.  [name] plans to continue to exercise by [employer] and possibly [employer].         Exercise Goals and Review: Exercise Goals    Row Name 08/26/17 1426             Exercise Goals   Increase Physical Activity  Yes       Intervention  Provide advice, education, support and counseling about physical activity/exercise needs.;Develop an individualized exercise prescription for aerobic and resistive training based on initial evaluation findings, risk stratification, comorbidities and participant's personal goals.       Expected Outcomes  Achievement of increased cardiorespiratory fitness and enhanced flexibility, muscular endurance and strength shown through measurements of functional capacity and personal statement of participant.       Increase Strength and Stamina  Yes       Intervention  Provide  advice, education, support and counseling about physical activity/exercise needs.;Develop an individualized exercise prescription for aerobic and resistive training based on initial evaluation findings, risk stratification, comorbidities and participant's personal goals.       Expected Outcomes  Achievement of increased cardiorespiratory fitness and enhanced flexibility, muscular endurance and strength shown through measurements of functional capacity and personal statement of participant.       Able to understand and use rate of perceived exertion (RPE) scale  Yes       Intervention  Provide education and explanation on how to use RPE scale       Expected Outcomes  Short Term: Able to use RPE daily in rehab to express subjective intensity level;Long Term:  Able to use RPE to guide intensity level when exercising independently       Able to understand and use Dyspnea scale  Yes       Intervention  Provide education and explanation on how to use Dyspnea scale       Expected Outcomes  Short Term: Able to use Dyspnea scale daily in rehab to express subjective sense of shortness of breath during exertion;Long Term: Able to use Dyspnea scale to guide intensity level when exercising independently       Knowledge and understanding of Target Heart Rate Range (THRR)  Yes       Intervention  Provide education and explanation of THRR including how the numbers were predicted and where they are located for reference       Expected Outcomes  Short Term: Able to state/look up THRR;Long Term: Able to use THRR to govern intensity when exercising independently;Short Term: Able to use daily as guideline for intensity in rehab       Able to check pulse independently  Yes       Intervention  Provide education and demonstration on how to check pulse in carotid and radial arteries.;Review the importance of being able to check your own pulse for safety during independent exercise       Expected Outcomes  Short Term: Able to  explain why pulse checking is important during independent exercise;Long Term: Able to check pulse  independently and accurately       Understanding of Exercise Prescription  Yes       Intervention  Provide education, explanation, and written materials on patient's individual exercise prescription       Expected Outcomes  Short Term: Able to explain program exercise prescription;Long Term: Able to explain home exercise prescription to exercise independently          Exercise Goals Re-Evaluation : Exercise Goals Re-Evaluation    Row Name 08/31/17 0846 09/06/17 1704 09/09/17 1050 09/22/17 1527 09/30/17 0903     Exercise Goal Re-Evaluation   Exercise Goals Review  Increase Physical Activity;Increase Strength and Stamina;Able to understand and use rate of perceived exertion (RPE) scale;Knowledge and understanding of Target Heart Rate Range (THRR);Understanding of Exercise Prescription  Increase Physical Activity;Increase Strength and Stamina;Understanding of Exercise Prescription  Understanding of Exercise Prescription;Knowledge and understanding of Target Heart Rate Range (THRR);Able to understand and use rate of perceived exertion (RPE) scale;Able to check pulse independently  Increase Physical Activity;Understanding of Exercise Prescription;Increase Strength and Stamina  Increase Physical Activity;Understanding of Exercise Prescription;Increase Strength and Stamina   Comments  Reviewed RPE scale, THR and program prescription with pt today.  Pt voiced understanding and was given a copy of goals to take home.   Ronald Pippins is off to a good start in rehab.  He has completed his first full day of exercise.  We will continue to monitor his progression.   Reviewed home exercise with pt today.  Pt plans to walk and go to [employer] for exercise.  Reviewed THR, pulse, RPE, sign and symptoms, NTG use, and when to call 911 or MD.  Also discussed weather considerations and indoor options.  Pt voiced understanding.   [name] has been doing well in rehab.  He is going to [employer] and is already up to 3.5 mph on the treadmill.  We will continue to monitor his progression.   [name] has been going to [employer] 4-5 days a week and does weights and cardio.  He is not warming up first.  He is feeling stronger and has more stamina.    Expected Outcomes  Short: Use RPE daily to regulate intensity.  Long: Follow program prescription in THR.  Short: Attend rehab regularly.  Long: Continue to follow program prescription.   Short: Return to [employer] Long: Exericse regularly.   Short: Continue to build workload on ellipitical.  Long: Continue to exericse more on off days.   Short: Add warm-up to routine.  Long: Continue to exercise on off days.    [place] Name 10/05/17 1621 10/19/17 1607 11/02/17 0910 11/17/17 1428 11/25/17 0929     Exercise Goal Re-Evaluation   Exercise Goals Review  Increase Physical Activity;Understanding of Exercise Prescription;Increase Strength and Stamina  Increase Physical Activity;Understanding of Exercise Prescription;Increase Strength and Stamina  Increase Physical Activity;Understanding of Exercise Prescription;Increase Strength and Stamina  Increase Physical Activity;Understanding of Exercise Prescription;Increase Strength and Stamina  Increase Physical Activity;Understanding of Exercise Prescription;Increase Strength and Stamina   Comments  [name] continues to do well in rehab.  This week he is visiting his granddaughter in [place].  He is now up to level 6 on the bike.  We will continue to monitor his progression.   [name] has been doing well in rehab.  He is now up to 51 watts on the recumbent bike.  We will continue to monitor his progression.   [name] continues to do well in rehab.  He tries to get to the  gym, but last week his schedule was full and he did not go.  He knows he needs to exercise and it helps him feel better. Overall he is feelilng good.   [name] has continued to do well in rehab.  He is getting to the gym when he can.  He is doing well on elliptical.  We will continue to monitor his progression.   [name] has been doing well in rehab.  He is getting to [employer] 4 nights a week.  His wife is walking some with him, but has had get away from gym.  He is feeling a little weaker since starting and has moved up his cardiology visit.  When he is working hard, he wears out a lot faster.  He is worried that it may be his other blockage.  He has been able to snap back like he did before.    Expected Outcomes  Short: Add warm up into routine at home.  Long: Continue to exercise daily for stress relief.  Short: Continue to exercise more at home on off days.  Long: Continue to use exercise for stress relief.   Short: Make time to go to gym!  Long: Continue to exercise for stress relief  Short: Continue to make time to go to gym.  Long: Continue to exercise indepedently  Short: Exercise/walk while out in [place].  Long: Continue to build strength and stamina.    [place] Name 12/14/17 1535 12/16/17 0928           Exercise Goal Re-Evaluation   Exercise Goals Review  Increase Physical Activity;Understanding of Exercise Prescription;Increase Strength and Stamina  Increase Physical Activity;Understanding of Exercise Prescription;Increase Strength and Stamina      Comments  [name] returned today after being out for two weeks on vacation.  He was able to return to current workloads without a problem.  We will continue to monitor his progressin.   [name] will be graduating next week!!  He has not been going to [employer] consistently but plans to get back into it.  We talked about the importance of stick with it.       Expected Outcomes  Short: Continue to attend regularly until graduation!  Long: Continue to exercise independently.   Short: Graduation!!  Long: Continue to go to [employer] regularly.          Discharge Exercise Prescription (Final Exercise Prescription Changes): Exercise  Prescription Changes - 12/14/17 1500      Response to Exercise   Blood Pressure (Admit)  130/60    Blood Pressure (Exercise)  134/74    Blood Pressure (Exit)  112/58    Heart Rate (Admit)  78 bpm    Heart Rate (Exercise)  117 bpm    Heart Rate (Exit)  76 bpm    Rating of Perceived Exertion (Exercise)  12    Symptoms  none    Duration  Continue with 30 min of aerobic exercise without signs/symptoms of physical distress.    Intensity  THRR unchanged      Progression   Progression  Continue to progress workloads to maintain intensity without signs/symptoms of physical distress.    Average METs  4.19      Resistance Training   Training Prescription  Yes    Weight  7 lbs    Reps  10-15      Interval Training   Interval Training  No      Treadmill   MPH  3.5  Grade  2    Minutes  15    METs  4.64      Recumbant Bike   Level  6    Watts  54    Minutes  15    METs  3.73      Home Exercise Plan   Plans to continue exercise at  [employer] (comment) [employer]    Frequency  Add 3 additional days to program exercise sessions.    Initial Home Exercises Provided  09/09/17       Nutrition:  Target Goals: Understanding of nutrition guidelines, daily intake of sodium <1573m, cholesterol <2033m calories 30% from fat and 7% or less from saturated fats, daily to have 5 or more servings of fruits and vegetables.  Biometrics: Pre Biometrics - 08/26/17 1425      Pre Biometrics   Height  5' 7.5" (1.715 m)    Weight  223 lb 14.4 oz (101.6 kg)    Waist Circumference  44 inches    Hip Circumference  45 inches    Waist to Hip Ratio  0.98 %    BMI (Calculated)  34.53    Single Leg Stand  30 seconds      Post Biometrics - 11/25/17 1149       Post  Biometrics   Height  5' 8"  (1.727 m)    Weight  230 lb (104.3 kg)    Waist Circumference  44 inches    Hip Circumference  43 inches    Waist to Hip Ratio  1.02 %    BMI (Calculated)  34.98    Single Leg Stand  30  seconds       Nutrition Therapy Plan and Nutrition Goals: Nutrition Therapy & Goals - 09/08/17 1535      Nutrition Therapy   Diet  TLC    Protein (specify units)  1-4oz each meal    Saturated Fats  15 max. grams    Fruits and Vegetables  5 servings/day    Sodium  1500 grams      Personal Nutrition Goals   Personal Goal #2  Continue to work on gradual changes to control food portions and make heart healthy (low saturated fat, high fiber/veg/fruit) food choices.     Additional Goals?  No    Comments  Mr. Pennypacker feels he is doing well with diet, other than some struggle to lose weight. He has reduced food portions and plans to continue working to control caloric intake.        Nutrition Assessments: Nutrition Assessments - 12/16/17 0832      MEDFICTS Scores   Pre Score  40    Post Score  19    Score Difference  -21       Nutrition Goals Re-Evaluation: Nutrition Goals Re-Evaluation    Row Name 09/07/17 0911 09/30/17 0908 11/02/17 0916 11/25/17 0934 12/16/17 0932     Goals   Nutrition Goal  Meet with nurtitionist  Heart healty choices, more fruits and vegetables, less fried foods, continue with portion control  Heart healty choices, more fruits and vegetables, less fried foods, continue with portion control, portion control  Heart healty choices, more fruits and vegetables, less fried foods, continue with portion control, portion control  Heart healty choices, more fruits and vegetables, less fried foods, continue with portion control, portion control   Comment  made appointment to meet with dietician tomorrow afternoon.  JeSonia Sides dong better with portion control.  He is eating  more fruits and vegetables.  He is still struggling with fried foods as he enjoys eating.    [name] is doing better with his nutrition.  He went to [employer] last week and actually got the baked chicken!!  He also avoided the bread, potatoes, and desserts!  He was quite proud of himself.    He is trying to  stay away from the fired foods.  He has also continued to work on his portion control!  [name] really has worked on his portion control, but he is not watching what he is eating as much.  He know that he needs to eat better overall. He is willing to wok on it.  [name] is going to continue to work on portions and staying away from fried foods.    Expected Outcome  Short: Meet with nutritionist  Long: Follow recommendations.  Short: Cut back on fried foods.  Long: Continue to follow recommendations and heart healthy diet.   Short: Continue to trade out fried foods.  Long: Continue to work on heart healthy diet.   Short: Continue to avoid more fried foods.  Long: Continue to watch portions.   Short: Continue to avoid more fried foods.  Long: Continue to watch portions.       Nutrition Goals Discharge (Final Nutrition Goals Re-Evaluation): Nutrition Goals Re-Evaluation - 12/16/17 0932      Goals   Nutrition Goal  Heart healty choices, more fruits and vegetables, less fried foods, continue with portion control, portion control    Comment  [name] is going to continue to work on portions and staying away from fried foods.     Expected Outcome  Short: Continue to avoid more fried foods.  Long: Continue to watch portions.        Psychosocial: Target Goals: Acknowledge presence or absence of significant depression and/or stress, maximize coping skills, provide positive support system. Participant is able to verbalize types and ability to use techniques and skills needed for reducing stress and depression.   Initial Review & Psychosocial Screening: Initial Psych Review & Screening - 08/26/17 1242      Initial Review   Current issues with  Current Anxiety/Panic;Current Sleep Concerns;Current Stress Concerns    Source of Stress Concerns  Occupation;Unable to perform yard/household activities    Comments  Due to age and chronic illness unable to do all he wants to do for activities      [place]?  Yes spouse      Barriers   Psychosocial barriers to participate in program  There are no identifiable barriers or psychosocial needs.;The patient should benefit from training in stress management and relaxation.      Screening Interventions   Interventions  Encouraged to exercise;Provide feedback about the scores to participant;To provide support and resources with identified psychosocial needs;Yes    Expected Outcomes  Short Term goal: Utilizing psychosocial counselor, staff and physician to assist with identification of specific Stressors or current issues interfering with healing process. Setting desired goal for each stressor or current issue identified.;Long Term Goal: Stressors or current issues are controlled or eliminated.;Short Term goal: Identification and review with participant of any Quality of Life or Depression concerns found by scoring the questionnaire.;Long Term goal: The participant improves quality of Life and PHQ9 Scores as seen by post scores and/or verbalization of changes       Quality of Life Scores:  Quality of Life - 12/16/17 0833      Quality  of Life Scores   Health/Function Pre  16.27 %    Health/Function Post  9.6 %    Health/Function % Change  -41 %    Socioeconomic Pre  20.38 %    Socioeconomic Post  21.21 %    Socioeconomic % Change   4.07 %    Psych/Spiritual Pre  17.64 %    Psych/Spiritual Post  14.57 %    Psych/Spiritual % Change  -17.4 %    Family Pre  24 %    Family Post  25.2 %    Family % Change  5 %    GLOBAL Pre  18.59 %    GLOBAL Post  15.31 %    GLOBAL % Change  -17.64 %      Scores of 19 and below usually indicate a poorer quality of life in these areas.  A difference of  2-3 points is a clinically meaningful difference.  A difference of 2-3 points in the total score of the Quality of Life Index has been associated with significant improvement in overall quality of life, self-image, physical symptoms, and general  health in studies assessing change in quality of life.  PHQ-9: Recent Review Flowsheet Data    Depression screen [facility] 2/9 12/20/2017 12/16/2017 08/26/2017 06/03/2017 11/18/2016   Decreased Interest 2 2 1  0 0   Down, Depressed, Hopeless 3 3 1  0 0   PHQ - 2 Score 5 5 2  0 0   Altered sleeping 3 3 2  - -   Tired, decreased energy 3 3 1  - -   Change in appetite 0 0 0 - -   Feeling bad or failure about yourself  1 1 0 - -   Trouble concentrating 0 0 0 - -   Moving slowly or fidgety/restless 0 0 0 - -   Suicidal thoughts 0 0 0 - -   PHQ-9 Score 12 12 5  - -   Difficult doing work/chores - Not difficult at all Not difficult at all - -     Interpretation of Total Score  Total Score Depression Severity:  1-4 = Minimal depression, 5-9 = Mild depression, 10-14 = Moderate depression, 15-19 = Moderately severe depression, 20-27 = Severe depression   Psychosocial Evaluation and Intervention: Psychosocial Evaluation - 08/31/17 0930      Psychosocial Evaluation & Interventions   Interventions  Encouraged to exercise with the program and follow exercise prescription;Relaxation education;Stress management education    Comments  Counselor met with Mr. Arocho Ronald Pippins) today for initial psychosocial evaluation.  He is a 72 year old who had a heart cath and (3) stents inserted recently for blockages.  He has a strong support system with a spouse of 46 years; a host of nieces and nephews and a sister who lives locally.  Ronald Pippins is also actively involved in his local church community.  He reports sleeping "okay" although he has sleep apnea and indicated the CPAP "did not work" for him.  His appetite is "too good."  He denies a history of depression or anxiety or any current symptoms.  Ronald Pippins states his mood is typically positive most of the time.  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  He has goals to "feel better about exercising" since his procedure; and to get a  baseline to know how to exercise on his own with confidence.  Staff will be following with Ronald Pippins throughout the course of this  program.      Expected Outcomes  Ronald Pippins will benefit from consistent exercise to achieve his stated goals.  The educational and psychoeducational components of this program will be helpful in understanding his condition and learning positive ways to cope with the stress in his life.      Continue Psychosocial Services   Follow up required by staff       Psychosocial Re-Evaluation: Psychosocial Re-Evaluation    [place]isco Name 09/30/17 0910 11/02/17 0918 11/25/17 0935 12/16/17 0934       Psychosocial Re-Evaluation   Current issues with  Current Stress Concerns  Current Stress Concerns  Current Stress Concerns;Current Sleep Concerns  Current Stress Concerns;Current Sleep Concerns    Comments  [name] is going to visit his grandson next week in [place] for his second birthday.  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  He is looking forward to having time off next week.  They can't even go out to eat without people coming up to visit with him.  His wife is very supportive of him.    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  It keeps him very busy.  However, he continues to sleep well and is coping well overall. He knows going to the gym helps him regulate his stress levels.    [name] is doing well mentally.  He is doing his best to manage his job stress.  He has found exercise to helpful.  He is not sleeping now.  Only getting 4-5 hrs a night. He doesn't go to bed until 1130pm.  He is going to try to get to bed a little earlier.   Reviewed Jerry's homework today.  His QOL and PHQ9 scores have all gotten worse.  Upon talking with him about it, he said that he is feeling differently on how it is affecting him.  He was agreeable to have Juliann Pulse meet with him next Thursday before he graduates.     Expected Outcomes  Short: Enjoy trip to [place]: Continue to  cope well with stressors.   Short: Continue to exercise to reduce stress.  Long: Continue to cope with job.   Short: Try to get to bed earlier.  Long: Continue to cope with job stress.   Short: Talk with Juliann Pulse.  Long: Continue to cope with job stress postively and exercise.     Interventions  Encouraged to attend Cardiac Rehabilitation for the exercise;Stress management education  Encouraged to attend Cardiac Rehabilitation for the exercise;Stress management education  Encouraged to attend Cardiac Rehabilitation for the exercise;Stress management education  Encouraged to attend Cardiac Rehabilitation for the exercise;Stress management education    Continue Psychosocial Services   Follow up required by staff  Follow up required by staff  Follow up required by staff  Follow up required by counselor      Initial Review   Source of Stress Concerns  Occupation;Unable to perform yard/household activities  Occupation;Unable to perform yard/household activities  -  Occupation;Unable to perform yard/household activities       Psychosocial Discharge (Final Psychosocial Re-Evaluation): Psychosocial Re-Evaluation - 12/16/17 0934      Psychosocial Re-Evaluation   Current issues with  Current Stress Concerns;Current Sleep Concerns    Comments  Reviewed Jerry's homework today.  His QOL and PHQ9 scores have all gotten worse.  Upon talking with him about it, he said that he is feeling differently on how it is affecting him.  He was agreeable to have  Juliann Pulse meet with him next Thursday before he graduates.     Expected Outcomes  Short: Talk with Juliann Pulse.  Long: Continue to cope with job stress postively and exercise.     Interventions  Encouraged to attend Cardiac Rehabilitation for the exercise;Stress management education    Continue Psychosocial Services   Follow up required by counselor      Initial Review   Source of Stress Concerns  Occupation;Unable to perform yard/household activities       Vocational  Rehabilitation: Provide vocational rehab assistance to qualifying candidates.   Vocational Rehab Evaluation & Intervention: Vocational Rehab - 08/26/17 1244      Initial Vocational Rehab Evaluation & Intervention   Assessment shows need for Vocational Rehabilitation  No       Education: Education Goals: Education classes will be provided on a variety of topics geared toward better understanding of heart health and risk factor modification. Participant will state understanding/return demonstration of topics presented as noted by education test scores.  Learning Barriers/Preferences: Learning Barriers/Preferences - 08/26/17 1244      Learning Barriers/Preferences   Learning Barriers  None    Learning Preferences  Individual Instruction       Education Topics:  AED/CPR: - Group verbal and written instruction with the use of models to demonstrate the basic use of the AED with the basic ABC's of resuscitation.   General Nutrition Guidelines/Fats and Fiber: -Group instruction provided by verbal, written material, models and posters to present the general guidelines for heart healthy nutrition. Gives an explanation and review of dietary fats and fiber.   Cardiac Rehab from 01/04/2018 in [facility] Cardiac and Pulmonary Rehab  Date  11/09/17  Educator  PI  Instruction Review Code  1- Verbalizes Understanding      Controlling Sodium/Reading Food Labels: -Group verbal and written material supporting the discussion of sodium use in heart healthy nutrition. Review and explanation with models, verbal and written materials for utilization of the food label.   Cardiac Rehab from 01/04/2018 in [facility] Cardiac and Pulmonary Rehab  Date  01/04/18  Educator  PI  Instruction Review Code  1- Verbalizes Understanding      Exercise Physiology & General Exercise Guidelines: - Group verbal and written instruction with models to review the exercise physiology of the cardiovascular system and associated  critical values. Provides general exercise guidelines with specific guidelines to those with heart or lung disease.    Cardiac Rehab from 01/04/2018 in [facility] Cardiac and Pulmonary Rehab  Date  11/25/17  Educator  [facility]  Instruction Review Code  1- Verbalizes Understanding      Aerobic Exercise & Resistance Training: - Gives group verbal and written instruction on the various components of exercise. Focuses on aerobic and resistive training programs and the benefits of this training and how to safely progress through these programs..   Flexibility, Balance, Mind/Body Relaxation: Provides group verbal/written instruction on the benefits of flexibility and balance training, including mind/body exercise modes such as yoga, pilates and tai chi.  Demonstration and skill practice provided.   Cardiac Rehab from 01/04/2018 in [facility] Cardiac and Pulmonary Rehab  Date  10/14/17  Educator  AS  Instruction Review Code  1- Verbalizes Understanding      Stress and Anxiety: - Provides group verbal and written instruction about the health risks of elevated stress and causes of high stress.  Discuss the correlation between heart/lung disease and anxiety and treatment options. Review healthy ways to manage with stress and anxiety.  Cardiac Rehab from 01/04/2018 in [facility] Cardiac and Pulmonary Rehab  Date  12/14/17  Educator  [facility]  Instruction Review Code  1- Verbalizes Understanding      Depression: - Provides group verbal and written instruction on the correlation between heart/lung disease and depressed mood, treatment options, and the stigmas associated with seeking treatment.   Anatomy & Physiology of the Heart: - Group verbal and written instruction and models provide basic cardiac anatomy and physiology, with the coronary electrical and arterial systems. Review of Valvular disease and Heart Failure   Cardiac Rehab from 01/04/2018 in [facility] Cardiac and Pulmonary Rehab  Date  12/16/17  Educator  CE   Instruction Review Code  1- Verbalizes Understanding      Cardiac Procedures: - Group verbal and written instruction to review commonly prescribed medications for heart disease. Reviews the medication, class of the drug, and side effects. Includes the steps to properly store meds and maintain the prescription regimen. (beta blockers and nitrates)   Cardiac Rehab from 01/04/2018 in [facility] Cardiac and Pulmonary Rehab  Date  11/11/17  Educator  CE  Instruction Review Code  1- Verbalizes Understanding      Cardiac Medications I: - Group verbal and written instruction to review commonly prescribed medications for heart disease. Reviews the medication, class of the drug, and side effects. Includes the steps to properly store meds and maintain the prescription regimen.   Cardiac Rehab from 01/04/2018 in [facility] Cardiac and Pulmonary Rehab  Date  12/21/17  Educator  SB  Instruction Review Code  1- Verbalizes Understanding      Cardiac Medications II: -Group verbal and written instruction to review commonly prescribed medications for heart disease. Reviews the medication, class of the drug, and side effects. (all other drug classes)   Cardiac Rehab from 01/04/2018 in [facility] Cardiac and Pulmonary Rehab  Date  10/21/17  Educator  [facility]  Instruction Review Code  1- Verbalizes Understanding       Go Sex-Intimacy & Heart Disease, Get SMART - Goal Setting: - Group verbal and written instruction through game format to discuss heart disease and the return to sexual intimacy. Provides group verbal and written material to discuss and apply goal setting through the application of the S.M.A.R.T. Method.   Cardiac Rehab from 01/04/2018 in [facility] Cardiac and Pulmonary Rehab  Date  11/11/17  Educator  CE  Instruction Review Code  1- Verbalizes Understanding      Other Matters of the Heart: - Provides group verbal, written materials and models to describe Stable Angina and Peripheral Artery. Includes description  of the disease process and treatment options available to the cardiac patient.   Cardiac Rehab from 01/04/2018 in [facility] Cardiac and Pulmonary Rehab  Date  12/16/17  Educator  CE  Instruction Review Code  1- Verbalizes Understanding      Exercise & Equipment Safety: - Individual verbal instruction and demonstration of equipment use and safety with use of the equipment.   Cardiac Rehab from 01/04/2018 in [facility] Cardiac and Pulmonary Rehab  Date  08/26/17  Educator  Sb  Instruction Review Code  1- Verbalizes Understanding      Infection Prevention: - Provides verbal and written material to individual with discussion of infection control including proper hand washing and proper equipment cleaning during exercise session.   Cardiac Rehab from 01/04/2018 in [facility] Cardiac and Pulmonary Rehab  Date  08/26/17  Educator  Sb  Instruction Review Code  1- Verbalizes Understanding      Falls  Prevention: - Provides verbal and written material to individual with discussion of falls prevention and safety.   Cardiac Rehab from 01/04/2018 in [facility] Cardiac and Pulmonary Rehab  Date  08/26/17  Educator  Sb  Instruction Review Code  1- Verbalizes Understanding      Diabetes: - Individual verbal and written instruction to review signs/symptoms of diabetes, desired ranges of glucose level fasting, after meals and with exercise. Acknowledge that pre and post exercise glucose checks will be done for 3 sessions at entry of program.   Know Your Numbers and Risk Factors: -Group verbal and written instruction about important numbers in your health.  Discussion of what are risk factors and how they play a role in the disease process.  Review of Cholesterol, Blood Pressure, Diabetes, and BMI and the role they play in your overall health.   Cardiac Rehab from 01/04/2018 in [facility] Cardiac and Pulmonary Rehab  Date  10/21/17  Educator  [facility]  Instruction Review Code  1- Verbalizes Understanding      Sleep  Hygiene: -Provides group verbal and written instruction about how sleep can affect your health.  Define sleep hygiene, discuss sleep cycles and impact of sleep habits. Review good sleep hygiene tips.    Cardiac Rehab from 01/04/2018 in [facility] Cardiac and Pulmonary Rehab  Date  11/02/17  Educator  [facility]  Instruction Review Code  1- Verbalizes Understanding      Other: -Provides group and verbal instruction on various topics (see comments)   Knowledge Questionnaire Score: Knowledge Questionnaire Score - 12/16/17 0832      Knowledge Questionnaire Score   Pre Score  24/28    Post Score  27/28       Core Components/Risk Factors/Patient Goals at Admission:   Core Components/Risk Factors/Patient Goals Review:  Goals and Risk Factor Review    Row Name 09/30/17 0904 11/02/17 0913 11/25/17 0932 12/16/17 0931       Core Components/Risk Factors/Patient Goals Review   Personal Goals Review  Weight Management/Obesity;Hypertension;Lipids  Weight Management/Obesity;Hypertension;Lipids  Weight Management/Obesity;Hypertension;Lipids  Weight Management/Obesity;Hypertension;Lipids    Review  [name] blood pressures have been good.  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  His weight is up higher than  he wants as he is still not eating perfectly. He has still been eating some fried foods but trying to get better.  We talked about adding intervals to help boosting weight loss.   Medications seem to working for now.  He has follow up appointment with cards but they did not do blood work.   [name] continues to do well in rehab.  His weight stays between 227-230 lbs.  He would like to lose more, but continues to eat some and has not been at the gym as much. He is going to try to get to gym and continue to eat better.  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  Lipids are doing well and his meds are working for him.   [name] continues to do well in rehab.  His  weight has stayed about the same.  He has cut back on portions, but not really altered what he is eating.  He continues to get his pressure checked regularly.  He has decreased his hydralzine to twice a day versus three time a day since we were getting low numbers. He has an annual physical for blood work at the end of the month.   [name] will be graduating  next week.  He wants to continue to focus on losing weight and plans to continue to monitor his blood pressures regularly.     Expected Outcomes  Short: Add in intervals to help with weight loss.  Long: Continue to work on risk factor modifications.   Short: Get back to gym to work on weight loss.  Long: Continue to monitor risk factors.   Short: Get blood work done to check lipids.  Long: Continue to work on weight loss.   Short: Graduate.  Long: Continue to work on weight loss and monitor pressures.        Core Components/Risk Factors/Patient Goals at Discharge (Final Review):  Goals and Risk Factor Review - 12/16/17 0931      Core Components/Risk Factors/Patient Goals Review   Personal Goals Review  Weight Management/Obesity;Hypertension;Lipids    Review  [name] will be graduating next week.  He wants to continue to focus on losing weight and plans to continue to monitor his blood pressures regularly.     Expected Outcomes  Short: Graduate.  Long: Continue to work on weight loss and monitor pressures.        ITP Comments: ITP Comments    Row Name 08/26/17 1250 09/15/17 0620 09/23/17 1035 10/13/17 0627 11/10/17 0630   ITP Comments  Medical review comlpeted  ITP sent to Dr Elta Guadeloupe Sabra Heck for review, changes as needed and signature.  Diagnosis documentatincan be found in [facility] Visit 05/27/2017  30 Day review. Continue with ITP unless directed changes per Medical Director review.   New to program  Multiple PVC started when he was using small hand weights in Cardiac Rehab. Kingslee said he didn't feel PVCs but did feel a little light headed. Blood pressure  was 104/58 given 360 cc of water. Blood pressure came up to 118/70. Will fax PVC's to MD.   30 day review. Continue with ITP unless directed changes per Medical Director review. only 3 visits this month  30 Day review. Continue with ITP unless directed changes per Medical Director review.     Row Name 11/30/17 1552 12/08/17 0545 01/04/18 0837       ITP Comments  [name] is out this week visiting his grandchildren in [place].  30 day review. Continue with ITP unless directed changes per Medical Director  Discharge ITP sent and signed by Dr. Sabra Heck.  Discharge Summary routed to PCP and cardiologist.        Comments: Discharge ITP

## 2018-01-04 NOTE — Progress Notes (Signed)
Daily Session Note  Patient Details  Name: STANFORD STRAUCH MRN: [id number] Date of Birth: 08/03/1946 Referring Provider:     Cardiac Rehab from 08/26/2017 in [facility] Cardiac and Pulmonary Rehab  Referring Provider  [name]      Encounter Date: 01/04/2018  Check In: Session Check In - 01/04/18 0835      Check-In   Location  [facility]-Cardiac & Pulmonary Rehab    Staff Present  Heath Lark, RN, BSN, CCRP;Keghan Mcfarren [place], MA, RCEP, CCRP, Exercise Physiologist;Amanda Oletta Darter, [place], ACSM CEP, Exercise Physiologist    Supervising physician immediately available to respond to emergencies  See telemetry face sheet for immediately available ER MD    Medication changes reported      Yes    Comments  added imdur    Fall or balance concerns reported     No    Warm-up and Cool-down  Performed on first and last piece of equipment    Resistance Training Performed  Yes    VAD Patient?  No      Pain Assessment   Currently in Pain?  No/denies          Social History   Tobacco Use  Smoking Status Former Smoker  . Types: Cigarettes  Smokeless Tobacco Never Used  Tobacco Comment   quit 30 years ago     Goals Met:  Independence with exercise equipment Exercise tolerated well No report of cardiac concerns or symptoms Strength training completed today  Goals Unmet:  Not Applicable  Comments:  [name] graduated today from  rehab with 36 sessions completed.  Details of the patient's exercise prescription and what He needs to do in order to continue the prescription and progress were discussed with patient.  Patient was given a copy of prescription and goals.  Patient verbalized understanding.  [name] plans to continue to exercise by [employer] and possibly [employer].    Dr. Emily Filbert is Medical Director for [facility].

## 2018-01-04 NOTE — Progress Notes (Signed)
Discharge Progress Report  Patient Details  Name: Darrell [name] MRN: [id number] Date of Birth: 17-Feb-1946 Referring Provider:     Cardiac Rehab from 08/26/2017 in [facility]  Referring Provider  Darrell [name]       Number of Visits: 30  Reason for Discharge:  Patient reached a stable level of exercise. Patient independent in their exercise. Patient has met program and personal goals.  Smoking History:  Social History   Tobacco Use  Smoking Status Former Smoker  . Types: Cigarettes  Smokeless Tobacco Never Used  Tobacco Comment   quit 30 years ago     Diagnosis:  Status post coronary artery stent placement  ADL [facility]SD:   Initial Exercise Prescription: Initial Exercise Prescription - 08/26/17 1400      Date of Initial Exercise RX and Referring Provider   Date  08/26/17    Referring Provider  Darrell [name]      Treadmill   MPH  2.8    Grade  0.5    Minutes  15    METs  3.34      Recumbant Bike   Level  3    RPM  60    Watts  35    Minutes  15    METs  3      Elliptical   Level  1    Speed  3    Minutes  15      T5 Nustep   Level  2    SPM  80    Minutes  15    METs  3      Prescription Details   Frequency (times per week)  3    Duration  Progress to 30 minutes of continuous aerobic without signs/symptoms of physical distress      Intensity   THRR 40-80% of Max Heartrate  112-137    Ratings of Perceived Exertion  11-13    Perceived Dyspnea  0-4      Resistance Training   Training Prescription  Yes    Weight  5 lb    Reps  10-15       Discharge Exercise Prescription (Final Exercise Prescription Changes): Exercise Prescription Changes - 12/14/17 1500      Response to Exercise   Blood Pressure (Admit)  130/60    Blood Pressure (Exercise)  134/74    Blood Pressure (Exit)  112/58    Heart Rate (Admit)  78 bpm    Heart Rate (Exercise)  117 bpm    Heart Rate (Exit)  76 bpm    Rating of Perceived Exertion (Exercise)  12     Symptoms  none    Duration  Continue with 30 min of aerobic exercise without signs/symptoms of physical distress.    Intensity  THRR unchanged      Progression   Progression  Continue to progress workloads to maintain intensity without signs/symptoms of physical distress.    Average METs  4.19      Resistance Training   Training Prescription  Yes    Weight  7 lbs    Reps  10-15      Interval Training   Interval Training  No      Treadmill   MPH  3.5    Grade  2    Minutes  15    METs  4.64      Recumbant Bike   Level  6    Watts  54    Minutes  15    METs  3.73      Home Exercise Plan   Plans to continue exercise at  [facility] (comment) [employer]    Frequency  Add 3 additional days to program exercise sessions.    Initial Home Exercises Provided  09/09/17       Functional Capacity: 6 Minute Walk    Row Name 08/26/17 1427 11/25/17 1148       6 Minute Walk   Phase  -  Discharge    Distance  1600 feet  1760 feet    Distance % Change  -  10 %    Distance Feet Change  -  1600 ft    Walk Time  6 minutes  6 minutes    # of Rest Breaks  0  0    MPH  3.03  3.33    METS  2.96  3.27    RPE  8  12    Perceived Dyspnea   0  -    VO2 Peak  10.37  11.44    Symptoms  No  No    Resting HR  88 bpm  65 bpm    Resting BP  110/64  136/70    Resting Oxygen Saturation   95 %  -    Max Ex. HR  103 bpm  102 bpm    Max Ex. BP  116/68  156/70    2 Minute Post BP  110/62  -       Psychological, QOL, Others - Outcomes: PHQ 2/9: Depression screen [facility] 2/9 12/20/2017 12/16/2017 08/26/2017 06/03/2017 11/18/2016  Decreased Interest _0 0 0  Down, Depressed, Hopeless _1 0 0  PHQ - 2 Score _2 0 0  Altered sleeping _3 - -  Tired, decreased energy _4 - -  Change in appetite 0 0 0 - -  Feeling bad or failure about yourself  1 1 0 - -  Trouble concentrating 0 0 0 - -  Moving slowly or fidgety/restless 0 0 0 - -  Suicidal thoughts 0 0 0 - -  PHQ-9 Score _5 - -  Difficult doing work/chores - Not difficult at all Not difficult at all - -    Quality of Life: Quality of Life - 12/16/17 0833      Quality of Life Scores   Health/Function Pre  16.27 %    Health/Function Post  9.6 %    Health/Function % Change  -41 %    Socioeconomic Pre  20.38 %    Socioeconomic Post  21.21 %    Socioeconomic % Change   4.07 %    Psych/Spiritual Pre  17.64 %    Psych/Spiritual Post  14.57 %    Psych/Spiritual % Change  -17.4 %    Family Pre  24 %    Family Post  25.2 %    Family % Change  5 %    GLOBAL Pre  18.59 %    GLOBAL Post  15.31 %    GLOBAL % Change  -17.64 %       Personal Goals: Goals established at orientation with interventions provided to work toward goal.    Personal Goals Discharge: Goals and Risk Factor Review    Row Name 09/30/17 0904 11/02/17 0913 11/25/17 0932 12/16/17 0931       Core Components/Risk Factors/Patient Goals Review   Personal Goals Review  Weight Management/Obesity;Hypertension;Lipids  Weight Management/Obesity;Hypertension;Lipids  Weight Management/Obesity;Hypertension;Lipids  Weight Management/Obesity;Hypertension;Lipids    Review  Darrell [name] blood pressures have been good.  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  His weight is up higher than  he wants as he is still not eating perfectly. He has still been eating some fried foods but trying to get better.  We talked about adding intervals to help boosting weight loss.   Medications seem to working for now.  He has follow up appointment with cards but they did not do blood work.   Darrell [name] continues to do well in rehab.  His weight stays between 227-230 lbs.  He would like to lose more, but continues to eat some and has not been at the gym as much. He is going to try to get to gym and continue to eat better.  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  Lipids are doing well and his meds are working for him.   Darrell [name] continues  to do well in rehab.  His weight has stayed about the same.  He has cut back on portions, but not really altered what he is eating.  He continues to get his pressure checked regularly.  He has decreased his hydralzine to twice a day versus three time a day since we were getting low numbers. He has an annual physical for blood work at the end of the month.   Darrell [name] will be graduating next week.  He wants to continue to focus on losing weight and plans to continue to monitor his blood pressures regularly.     Expected Outcomes  Short: Add in intervals to help with weight loss.  Long: Continue to work on risk factor modifications.   Short: Get back to gym to work on weight loss.  Long: Continue to monitor risk factors.   Short: Get blood work done to check lipids.  Long: Continue to work on weight loss.   Short: Graduate.  Long: Continue to work on weight loss and monitor pressures.        Exercise Goals and Review: Exercise Goals    Row Name 08/26/17 1426             Exercise Goals   Increase Physical Activity  Yes       Intervention  Provide advice, education, support and counseling about physical activity/exercise needs.;Develop an individualized exercise prescription for aerobic and resistive training based on initial evaluation findings, risk stratification, comorbidities and participant's personal goals.       Expected Outcomes  Achievement of increased cardiorespiratory fitness and enhanced flexibility, muscular endurance and strength shown through measurements of functional capacity and personal statement of participant.       Increase Strength and Stamina  Yes       Intervention  Provide advice, education, support and counseling about physical activity/exercise needs.;Develop an individualized exercise prescription for aerobic and resistive training based on initial evaluation findings, risk stratification, comorbidities and participant's personal goals.       Expected Outcomes  Achievement  of increased cardiorespiratory fitness and enhanced flexibility, muscular endurance and strength shown through measurements of functional capacity and personal statement of participant.       Able to understand and use rate of perceived exertion (RPE) scale  Yes       Intervention  Provide education and explanation on how to use RPE scale       Expected Outcomes  Short  Term: Able to use RPE daily in rehab to express subjective intensity level;Long Term:  Able to use RPE to guide intensity level when exercising independently       Able to understand and use Dyspnea scale  Yes       Intervention  Provide education and explanation on how to use Dyspnea scale       Expected Outcomes  Short Term: Able to use Dyspnea scale daily in rehab to express subjective sense of shortness of breath during exertion;Long Term: Able to use Dyspnea scale to guide intensity level when exercising independently       Knowledge and understanding of Target Heart Rate Range (THRR)  Yes       Intervention  Provide education and explanation of THRR including how the numbers were predicted and where they are located for reference       Expected Outcomes  Short Term: Able to state/look up THRR;Long Term: Able to use THRR to govern intensity when exercising independently;Short Term: Able to use daily as guideline for intensity in rehab       Able to check pulse independently  Yes       Intervention  Provide education and demonstration on how to check pulse in carotid and radial arteries.;Review the importance of being able to check your own pulse for safety during independent exercise       Expected Outcomes  Short Term: Able to explain why pulse checking is important during independent exercise;Long Term: Able to check pulse independently and accurately       Understanding of Exercise Prescription  Yes       Intervention  Provide education, explanation, and written materials on patient's individual exercise prescription        Expected Outcomes  Short Term: Able to explain program exercise prescription;Long Term: Able to explain home exercise prescription to exercise independently          Nutrition & Weight - Outcomes: Pre Biometrics - 08/26/17 1425      Pre Biometrics   Height  5' 7.5" (1.715 m)    Weight  223 lb 14.4 oz (101.6 kg)    Waist Circumference  44 inches    Hip Circumference  45 inches    Waist to Hip Ratio  0.98 %    BMI (Calculated)  34.53    Single Leg Stand  30 seconds      Post Biometrics - 11/25/17 1149       Post  Biometrics   Height  _0  (1.727 m)    Weight  230 lb (104.3 kg)    Waist Circumference  44 inches    Hip Circumference  43 inches    Waist to Hip Ratio  1.02 %    BMI (Calculated)  34.98    Single Leg Stand  30 seconds       Nutrition: Nutrition Therapy & Goals - 09/08/17 1535      Nutrition Therapy   Diet  TLC    Protein (specify units)  1-4oz each meal    Saturated Fats  15 max. grams    Fruits and Vegetables  5 servings/day    Sodium  1500 grams      Personal Nutrition Goals   Personal Goal #2  Continue to work on gradual changes to control food portions and make heart healthy (low saturated fat, high fiber/veg/fruit) food choices.     Additional Goals?  No    Comments  Mr. Litke feels he  is doing well with diet, other than some struggle to lose weight. He has reduced food portions and plans to continue working to control caloric intake.        Nutrition Discharge: Nutrition Assessments - 12/16/17 0832      MEDFICTS Scores   Pre Score  40    Post Score  19    Score Difference  -21       Education Questionnaire Score: Knowledge Questionnaire Score - 12/16/17 0832      Knowledge Questionnaire Score   Pre Score  24/28    Post Score  27/28       Goals reviewed with patient; copy given to patient.

## 2018-01-05 ENCOUNTER — Encounter: Payer: Self-pay | Admitting: *Deleted

## 2018-01-05 DIAGNOSIS — Z955 Presence of coronary angioplasty implant and graft: Secondary | ICD-10-CM

## 2018-01-05 NOTE — Progress Notes (Signed)
Cardiac Individual Treatment Plan  Patient Details  Name: Darrell [name] MRN: [id number] Date of Birth: [birth year] Referring Provider:     Cardiac Rehab from 08/26/2017 in [facility] Cardiac and Pulmonary Rehab  Referring Provider  [name]      Initial Encounter Date:    Cardiac Rehab from 08/26/2017 in [facility] Cardiac and Pulmonary Rehab  Date  08/26/17  Referring Provider  [name]      Visit Diagnosis: Status post coronary artery stent placement  Patient's Home Medications on Admission:  Current Outpatient Medications:  .  allopurinol (ZYLOPRIM) 300 MG tablet, Take 0.5 tablets (150 mg total) by mouth daily., Disp: 90 tablet, Rfl: 4 .  apixaban (ELIQUIS) 5 MG TABS tablet, Take by mouth., Disp: , Rfl:  .  aspirin EC 81 MG EC tablet, Take 1 tablet (81 mg total) by mouth daily. (Patient not taking: Reported on 12/14/2017), Disp: , Rfl:  .  atorvastatin (LIPITOR) 80 MG tablet, Take 80 mg by mouth daily., Disp: , Rfl:  .  carvedilol (COREG) 3.125 MG tablet, , Disp: , Rfl: 0 .  clopidogrel (PLAVIX) 75 MG tablet, Take 75 mg by mouth daily., Disp: , Rfl:  .  hydrochlorothiazide (HYDRODIURIL) 25 MG tablet, Take 1 tablet (25 mg total) by mouth daily., Disp: 90 tablet, Rfl: 4 .  isosorbide mononitrate (IMDUR) 30 MG 24 hr tablet, Take by mouth., Disp: , Rfl:  .  naproxen (NAPROSYN) 500 MG tablet, Take 1 tablet (500 mg total) 2 (two) times daily with a meal by mouth., Disp: 10 tablet, Rfl: 0 .  nitroGLYCERIN (NITROSTAT) 0.4 MG SL tablet, Place under the tongue., Disp: , Rfl:  .  telmisartan (MICARDIS) 40 MG tablet, Take 40 mg by mouth daily., Disp: , Rfl:   Past Medical History: Past Medical History:  Diagnosis Date  . CAD (coronary artery disease)   . Cancer [facility])    prostate  . Carotid artery stenosis   . Hyperlipidemia   . Hypertension   . Psoriasis     Tobacco Use: Social History   Tobacco Use  Smoking Status Former Smoker  . Types: Cigarettes  Smokeless Tobacco Never Used   Tobacco Comment   quit 30 years ago     Labs: Recent Review Flowsheet Data    Labs for ITP Cardiac and Pulmonary Rehab Latest Ref Rng & Units 11/18/2016 12/17/2016 05/20/2017 06/09/2017 12/20/2017   Cholestrol 100 - 199 mg/dL 112 104 - 106 134   LDLCALC 0 - 99 mg/dL - 39 - 42 44   HDL >39 mg/dL - 41 - 41 39(L)   Trlycerides 0 - 149 mg/dL 264(H) 122 - 114 257(H)   Hemoglobin A1c 4.8 - 5.6 % - - 5.6 - -       Exercise Target Goals:    Exercise Program Goal: Individual exercise prescription set using results from initial 6 min walk test and THRR while considering  patient's activity barriers and safety.   Exercise Prescription Goal: Initial exercise prescription builds to 30-45 minutes a day of aerobic activity, 2-3 days per week.  Home exercise guidelines will be given to patient during program as part of exercise prescription that the participant will acknowledge.  Activity Barriers & Risk Stratification:   6 Minute Walk: 6 Minute Walk    Row Name 11/25/17 1148         6 Minute Walk   Phase  Discharge     Distance  1760 feet     Distance % Change  10 %  Distance Feet Change  1600 ft     Walk Time  6 minutes     # of Rest Breaks  0     MPH  3.33     METS  3.27     RPE  12     VO2 Peak  11.44     Symptoms  No     Resting HR  65 bpm     Resting BP  136/70     Max Ex. HR  102 bpm     Max Ex. BP  156/70        Oxygen Initial Assessment:   Oxygen Re-Evaluation:   Oxygen Discharge (Final Oxygen Re-Evaluation):   Initial Exercise Prescription:   Perform Capillary Blood Glucose checks as needed.  Exercise Prescription Changes: Exercise Prescription Changes    Row Name 11/17/17 1400 11/30/17 1500 12/14/17 1500         Response to Exercise   Blood Pressure (Admit)  134/74  136/70  130/60     Blood Pressure (Exercise)  142/74  156/70  134/74     Blood Pressure (Exit)  108/66  110/58  112/58     Heart Rate (Admit)  66 bpm  65 bpm  78 bpm     Heart Rate  (Exercise)  122 bpm  110 bpm  117 bpm     Heart Rate (Exit)  96 bpm  88 bpm  76 bpm     Rating of Perceived Exertion (Exercise)  _0 Symptoms  none  none  none     Duration  Continue with 30 min of aerobic exercise without signs/symptoms of physical distress.  Continue with 30 min of aerobic exercise without signs/symptoms of physical distress.  Continue with 30 min of aerobic exercise without signs/symptoms of physical distress.     Intensity  THRR unchanged  THRR unchanged  THRR unchanged       Progression   Progression  Continue to progress workloads to maintain intensity without signs/symptoms of physical distress.  Continue to progress workloads to maintain intensity without signs/symptoms of physical distress.  Continue to progress workloads to maintain intensity without signs/symptoms of physical distress.     Average METs  4  4  4.19       Resistance Training   Training Prescription  Yes  Yes  Yes     Weight  7 lbs  7 lbs  7 lbs     Reps  10-15  10-15  10-15       Interval Training   Interval Training  No  No  No       Treadmill   MPH  3.5  3.5  3.5     Grade  _1 Minutes  _2 METs  4.64  4.64  4.64       Recumbant Bike   Level  _3 Watts  43  43  54     Minutes  _4 METs  3.35  3.35  3.73       Elliptical   Level  1  1  -     Speed  2.5  3.2  -     Minutes  15  15  -       Home Exercise Plan  Plans to continue exercise at  [employer] (comment) [employer], research (medical) (comment) [employer] research (medical) (comment) [employer]     Frequency  Add 3 additional days to program exercise sessions.  Add 3 additional days to program exercise sessions.  Add 3 additional days to program exercise sessions.     Initial Home Exercises Provided  09/09/17  09/09/17  09/09/17        Exercise Comments: Exercise Comments    Row Name 01/04/18 4388           Exercise Comments   Darrell [name] graduated today  from  rehab with 36 sessions completed.  Details of the patient's exercise prescription and what He needs to do in order to continue the prescription and progress were discussed with patient.  Patient was given a copy of prescription and goals.  Patient verbalized understanding.  Darrell [name] plans to continue to exercise by [employer] and possibly [employer].          Exercise Goals and Review:   Exercise Goals Re-Evaluation : Exercise Goals Re-Evaluation    Row Name 11/17/17 1428 11/25/17 0929 12/14/17 1535 12/16/17 0928       Exercise Goal Re-Evaluation   Exercise Goals Review  Increase Physical Activity;Understanding of Exercise Prescription;Increase Strength and Stamina  Increase Physical Activity;Understanding of Exercise Prescription;Increase Strength and Stamina  Increase Physical Activity;Understanding of Exercise Prescription;Increase Strength and Stamina  Increase Physical Activity;Understanding of Exercise Prescription;Increase Strength and Stamina    Comments  Darrell [name] has continued to do well in rehab. He is getting to the gym when he can.  He is doing well on elliptical.  We will continue to monitor his progression.   Darrell [name] has been doing well in rehab.  He is getting to [employer] 4 nights a week.  His wife is walking some with him, but has had get away from gym.  He is feeling a little weaker since starting and has moved up his cardiology visit.  When he is working hard, he wears out a lot faster.  He is worried that it may be his other blockage.  He has been able to snap back like he did before.   Darrell [name] returned today after being out for two weeks on vacation.  He was able to return to current workloads without a problem.  We will continue to monitor his progressin.   Darrell [name] will be graduating next week!!  He has not been going to [employer] consistently but plans to get back into it.  We talked about the importance of stick with it.     Expected Outcomes  Short: Continue to make  time to go to gym.  Long: Continue to exercise indepedently  Short: Exercise/walk while out in [place].  Long: Continue to build strength and stamina.   Short: Continue to attend regularly until graduation!  Long: Continue to exercise independently.   Short: Graduation!!  Long: Continue to go to [employer] regularly.        Discharge Exercise Prescription (Final Exercise Prescription Changes): Exercise Prescription Changes - 12/14/17 1500      Response to Exercise   Blood Pressure (Admit)  130/60    Blood Pressure (Exercise)  134/74    Blood Pressure (Exit)  112/58    Heart Rate (Admit)  78 bpm    Heart Rate (Exercise)  117 bpm    Heart Rate (Exit)  76 bpm    Rating of Perceived Exertion (Exercise)  12  Symptoms  none    Duration  Continue with 30 min of aerobic exercise without signs/symptoms of physical distress.    Intensity  THRR unchanged      Progression   Progression  Continue to progress workloads to maintain intensity without signs/symptoms of physical distress.    Average METs  4.19      Resistance Training   Training Prescription  Yes    Weight  7 lbs    Reps  10-15      Interval Training   Interval Training  No      Treadmill   MPH  3.5    Grade  2    Minutes  15    METs  4.64      Recumbant Bike   Level  6    Watts  54    Minutes  15    METs  3.73      Home Exercise Plan   Plans to continue exercise at  [employer] (comment) [employer]    Frequency  Add 3 additional days to program exercise sessions.    Initial Home Exercises Provided  09/09/17       Nutrition:  Target Goals: Understanding of nutrition guidelines, daily intake of sodium <1563m, cholesterol <2035m calories 30% from fat and 7% or less from saturated fats, daily to have 5 or more servings of fruits and vegetables.  Biometrics:  Post Biometrics - 11/25/17 1149       Post  Biometrics   Height  _0  (1.727 m)    Weight  230 lb (104.3 kg)    Waist Circumference  44  inches    Hip Circumference  43 inches    Waist to Hip Ratio  1.02 %    BMI (Calculated)  34.98    Single Leg Stand  30 seconds       Nutrition Therapy Plan and Nutrition Goals:   Nutrition Assessments: Nutrition Assessments - 12/16/17 0832      MEDFICTS Scores   Pre Score  40    Post Score  19    Score Difference  -21       Nutrition Goals Re-Evaluation: Nutrition Goals Re-Evaluation    Row Name 11/25/17 0934 12/16/17 0932           Goals   Nutrition Goal  Heart healty choices, more fruits and vegetables, less fried foods, continue with portion control, portion control  Heart healty choices, more fruits and vegetables, less fried foods, continue with portion control, portion control      Comment  JeSonia Sideeally has worked on his portion control, but he is not watching what he is eating as much.  He know that he needs to eat better overall. He is willing to wok on it.  JeSonia Sides going to continue to work on portions and staying away from fried foods.       Expected Outcome  Short: Continue to avoid more fried foods.  Long: Continue to watch portions.   Short: Continue to avoid more fried foods.  Long: Continue to watch portions.          Nutrition Goals Discharge (Final Nutrition Goals Re-Evaluation): Nutrition Goals Re-Evaluation - 12/16/17 0932      Goals   Nutrition Goal  Heart healty choices, more fruits and vegetables, less fried foods, continue with portion control, portion control    Comment  JeSonia Sides going to continue to work on portions and staying away from fried foods.  Expected Outcome  Short: Continue to avoid more fried foods.  Long: Continue to watch portions.        Psychosocial: Target Goals: Acknowledge presence or absence of significant depression and/or stress, maximize coping skills, provide positive support system. Participant is able to verbalize types and ability to use techniques and skills needed for reducing stress and depression.   Initial  Review & Psychosocial Screening:   Quality of Life Scores:  Quality of Life - 12/16/17 0833      Quality of Life Scores   Health/Function Pre  16.27 %    Health/Function Post  9.6 %    Health/Function % Change  -41 %    Socioeconomic Pre  20.38 %    Socioeconomic Post  21.21 %    Socioeconomic % Change   4.07 %    Psych/Spiritual Pre  17.64 %    Psych/Spiritual Post  14.57 %    Psych/Spiritual % Change  -17.4 %    Family Pre  24 %    Family Post  25.2 %    Family % Change  5 %    GLOBAL Pre  18.59 %    GLOBAL Post  15.31 %    GLOBAL % Change  -17.64 %      Scores of 19 and below usually indicate a poorer quality of life in these areas.  A difference of  2-3 points is a clinically meaningful difference.  A difference of 2-3 points in the total score of the Quality of Life Index has been associated with significant improvement in overall quality of life, self-image, physical symptoms, and general health in studies assessing change in quality of life.  PHQ-9: Recent Review Flowsheet Data    Depression screen [facility] 2/9 12/20/2017 12/16/2017 08/26/2017 06/03/2017 11/18/2016   Decreased Interest _0 0 0   Down, Depressed, Hopeless _1 0 0   PHQ - 2 Score _2 0 0   Altered sleeping _3 - -   Tired, decreased energy _4 - -   Change in appetite 0 0 0 - -   Feeling bad or failure about yourself  1 1 0 - -   Trouble concentrating 0 0 0 - -   Moving slowly or fidgety/restless 0 0 0 - -   Suicidal thoughts 0 0 0 - -   PHQ-9 Score _5 - -   Difficult doing work/chores - Not difficult at all Not difficult at all - -     Interpretation of Total Score  Total Score Depression Severity:  1-4 = Minimal depression, 5-9 = Mild depression, 10-14 = Moderate depression, 15-19 = Moderately severe depression, 20-27 = Severe depression   Psychosocial Evaluation and Intervention:   Psychosocial Re-Evaluation: Psychosocial Re-Evaluation    Row Name 11/25/17 0935 12/16/17 0934            Psychosocial Re-Evaluation   Current issues with  Current Stress Concerns;Current Sleep Concerns  Current Stress Concerns;Current Sleep Concerns      Comments  Darrell [name] is doing well mentally.  He is doing his best to manage his job stress.  He has found exercise to helpful.  He is not sleeping now.  Only getting 4-5 hrs a night. He doesn't go to bed until 1130pm.  He is going to try to get to bed a little earlier.   Reviewed Jerry's homework today.  His QOL and PHQ9 scores have all gotten worse.  Upon talking with him about it, he said that he is feeling differently on how it is affecting him.  He was agreeable to have Juliann Pulse meet with him next Thursday before he graduates.       Expected Outcomes  Short: Try to get to bed earlier.  Long: Continue to cope with job stress.   Short: Talk with Juliann Pulse.  Long: Continue to cope with job stress postively and exercise.       Interventions  Encouraged to attend Cardiac Rehabilitation for the exercise;Stress management education  Encouraged to attend Cardiac Rehabilitation for the exercise;Stress management education      Continue Psychosocial Services   Follow up required by staff  Follow up required by counselor        Initial Review   Source of Stress Concerns  -  Occupation;Unable to perform yard/household activities         Psychosocial Discharge (Final Psychosocial Re-Evaluation): Psychosocial Re-Evaluation - 12/16/17 0934      Psychosocial Re-Evaluation   Current issues with  Current Stress Concerns;Current Sleep Concerns    Comments  Reviewed Jerry's homework today.  His QOL and PHQ9 scores have all gotten worse.  Upon talking with him about it, he said that he is feeling differently on how it is affecting him.  He was agreeable to have Juliann Pulse meet with him next Thursday before he graduates.     Expected Outcomes  Short: Talk with Juliann Pulse.  Long: Continue to cope with job stress postively and exercise.     Interventions  Encouraged to attend Cardiac  Rehabilitation for the exercise;Stress management education    Continue Psychosocial Services   Follow up required by counselor      Initial Review   Source of Stress Concerns  Occupation;Unable to perform yard/household activities       Vocational Rehabilitation: Provide vocational rehab assistance to qualifying candidates.   Vocational Rehab Evaluation & Intervention:   Education: Education Goals: Education classes will be provided on a variety of topics geared toward better understanding of heart health and risk factor modification. Participant will state understanding/return demonstration of topics presented as noted by education test scores.  Learning Barriers/Preferences:   Education Topics:  AED/CPR: - Group verbal and written instruction with the use of models to demonstrate the basic use of the AED with the basic ABC's of resuscitation.   General Nutrition Guidelines/Fats and Fiber: -Group instruction provided by verbal, written material, models and posters to present the general guidelines for heart healthy nutrition. Gives an explanation and review of dietary fats and fiber.   Cardiac Rehab from 01/04/2018 in [facility] Cardiac and Pulmonary Rehab  Date  11/09/17  Educator  PI  Instruction Review Code  1- Verbalizes Understanding      Controlling Sodium/Reading Food Labels: -Group verbal and written material supporting the discussion of sodium use in heart healthy nutrition. Review and explanation with models, verbal and written materials for utilization of the food label.   Cardiac Rehab from 01/04/2018 in [facility] Cardiac and Pulmonary Rehab  Date  01/04/18  Educator  PI  Instruction Review Code  1- Verbalizes Understanding      Exercise Physiology & General Exercise Guidelines: - Group verbal and written instruction with models to review the exercise physiology of the cardiovascular system and associated critical values. Provides general exercise guidelines with  specific guidelines to those with heart or lung disease.    Cardiac Rehab from 01/04/2018 in [facility] Cardiac and Pulmonary Rehab  Date  11/25/17  Educator  [facility]  Instruction Review Code  1- Verbalizes Understanding      Aerobic Exercise & Resistance Training: - Gives group verbal and written instruction on the various components of exercise. Focuses on aerobic and resistive training programs and the benefits of this training and how to safely progress through these programs..   Flexibility, Balance, Mind/Body Relaxation: Provides group verbal/written instruction on the benefits of flexibility and balance training, including mind/body exercise modes such as yoga, pilates and tai chi.  Demonstration and skill practice provided.   Cardiac Rehab from 01/04/2018 in [facility] Cardiac and Pulmonary Rehab  Date  10/14/17  Educator  AS  Instruction Review Code  1- Verbalizes Understanding      Stress and Anxiety: - Provides group verbal and written instruction about the health risks of elevated stress and causes of high stress.  Discuss the correlation between heart/lung disease and anxiety and treatment options. Review healthy ways to manage with stress and anxiety.   Cardiac Rehab from 01/04/2018 in [facility] Cardiac and Pulmonary Rehab  Date  12/14/17  Educator  [facility]  Instruction Review Code  1- Verbalizes Understanding      Depression: - Provides group verbal and written instruction on the correlation between heart/lung disease and depressed mood, treatment options, and the stigmas associated with seeking treatment.   Anatomy & Physiology of the Heart: - Group verbal and written instruction and models provide basic cardiac anatomy and physiology, with the coronary electrical and arterial systems. Review of Valvular disease and Heart Failure   Cardiac Rehab from 01/04/2018 in [facility] Cardiac and Pulmonary Rehab  Date  12/16/17  Educator  CE  Instruction Review Code  1- Verbalizes Understanding       Cardiac Procedures: - Group verbal and written instruction to review commonly prescribed medications for heart disease. Reviews the medication, class of the drug, and [name] effects. Includes the steps to properly store meds and maintain the prescription regimen. (beta blockers and nitrates)   Cardiac Rehab from 01/04/2018 in [facility] Cardiac and Pulmonary Rehab  Date  11/11/17  Educator  CE  Instruction Review Code  1- Verbalizes Understanding      Cardiac Medications I: - Group verbal and written instruction to review commonly prescribed medications for heart disease. Reviews the medication, class of the drug, and [name] effects. Includes the steps to properly store meds and maintain the prescription regimen.   Cardiac Rehab from 01/04/2018 in [facility] Cardiac and Pulmonary Rehab  Date  12/21/17  Educator  SB  Instruction Review Code  1- Verbalizes Understanding      Cardiac Medications II: -Group verbal and written instruction to review commonly prescribed medications for heart disease. Reviews the medication, class of the drug, and [name] effects. (all other drug classes)   Cardiac Rehab from 01/04/2018 in [facility] Cardiac and Pulmonary Rehab  Date  10/21/17  Educator  [facility]  Instruction Review Code  1- Verbalizes Understanding       Go Sex-Intimacy & Heart Disease, Get SMART - Goal Setting: - Group verbal and written instruction through game format to discuss heart disease and the return to sexual intimacy. Provides group verbal and written material to discuss and apply goal setting through the application of the S.M.A.R.T. Method.   Cardiac Rehab from 01/04/2018 in [facility] Cardiac and Pulmonary Rehab  Date  11/11/17  Educator  CE  Instruction Review Code  1- Verbalizes Understanding      Other Matters of the Heart: - Provides group verbal, written materials and models  to describe Stable Angina and Peripheral Artery. Includes description of the disease process and treatment options available to  the cardiac patient.   Cardiac Rehab from 01/04/2018 in [facility] Cardiac and Pulmonary Rehab  Date  12/16/17  Educator  CE  Instruction Review Code  1- Verbalizes Understanding      Exercise & Equipment Safety: - Individual verbal instruction and demonstration of equipment use and safety with use of the equipment.   Cardiac Rehab from 01/04/2018 in [facility] Cardiac and Pulmonary Rehab  Date  08/26/17  Educator  Sb  Instruction Review Code  1- Verbalizes Understanding      Infection Prevention: - Provides verbal and written material to individual with discussion of infection control including proper hand washing and proper equipment cleaning during exercise session.   Cardiac Rehab from 01/04/2018 in [facility] Cardiac and Pulmonary Rehab  Date  08/26/17  Educator  Sb  Instruction Review Code  1- Verbalizes Understanding      Falls Prevention: - Provides verbal and written material to individual with discussion of falls prevention and safety.   Cardiac Rehab from 01/04/2018 in [facility] Cardiac and Pulmonary Rehab  Date  08/26/17  Educator  Sb  Instruction Review Code  1- Verbalizes Understanding      Diabetes: - Individual verbal and written instruction to review signs/symptoms of diabetes, desired ranges of glucose level fasting, after meals and with exercise. Acknowledge that pre and post exercise glucose checks will be done for 3 sessions at entry of program.   Know Your Numbers and Risk Factors: -Group verbal and written instruction about important numbers in your health.  Discussion of what are risk factors and how they play a role in the disease process.  Review of Cholesterol, Blood Pressure, Diabetes, and BMI and the role they play in your overall health.   Cardiac Rehab from 01/04/2018 in [facility] Cardiac and Pulmonary Rehab  Date  10/21/17  Educator  [facility]  Instruction Review Code  1- Verbalizes Understanding      Sleep Hygiene: -Provides group verbal and written instruction about how  sleep can affect your health.  Define sleep hygiene, discuss sleep cycles and impact of sleep habits. Review good sleep hygiene tips.    Cardiac Rehab from 01/04/2018 in [facility] Cardiac and Pulmonary Rehab  Date  11/02/17  Educator  [facility]  Instruction Review Code  1- Verbalizes Understanding      Other: -Provides group and verbal instruction on various topics (see comments)   Knowledge Questionnaire Score: Knowledge Questionnaire Score - 12/16/17 0832      Knowledge Questionnaire Score   Pre Score  24/28    Post Score  27/28       Core Components/Risk Factors/Patient Goals at Admission:   Core Components/Risk Factors/Patient Goals Review:  Goals and Risk Factor Review    Row Name 11/25/17 0932 12/16/17 0931           Core Components/Risk Factors/Patient Goals Review   Personal Goals Review  Weight Management/Obesity;Hypertension;Lipids  Weight Management/Obesity;Hypertension;Lipids      Review  Darrell [name] continues to do well in rehab.  His weight has stayed about the same.  He has cut back on portions, but not really altered what he is eating.  He continues to get his pressure checked regularly.  He has decreased his hydralzine to twice a day versus three time a day since we were getting low numbers. He has an annual physical for blood work at the end of the month.   Darrell [name] will  be graduating next week.  He wants to continue to focus on losing weight and plans to continue to monitor his blood pressures regularly.       Expected Outcomes  Short: Get blood work done to check lipids.  Long: Continue to work on weight loss.   Short: Graduate.  Long: Continue to work on weight loss and monitor pressures.          Core Components/Risk Factors/Patient Goals at Discharge (Final Review):  Goals and Risk Factor Review - 12/16/17 0931      Core Components/Risk Factors/Patient Goals Review   Personal Goals Review  Weight Management/Obesity;Hypertension;Lipids    Review  Darrell [name] will be graduating  next week.  He wants to continue to focus on losing weight and plans to continue to monitor his blood pressures regularly.     Expected Outcomes  Short: Graduate.  Long: Continue to work on weight loss and monitor pressures.        ITP Comments: ITP Comments    Row Name 11/10/17 0630 11/30/17 1552 12/08/17 0545 01/04/18 0837     ITP Comments  30 Day review. Continue with ITP unless directed changes per Medical Director review.    Darrell [name] is out this week visiting his grandchildren in [place].  30 day review. Continue with ITP unless directed changes per Medical Director  Discharge ITP sent and signed by Dr. Sabra Heck.  Discharge Summary routed to PCP and cardiologist.       Comments: discharged

## 2018-01-24 ENCOUNTER — Ambulatory Visit (INDEPENDENT_AMBULATORY_CARE_PROVIDER_SITE_OTHER): Payer: Medicare Other | Admitting: Family Medicine

## 2018-01-24 ENCOUNTER — Encounter: Payer: Self-pay | Admitting: Family Medicine

## 2018-01-24 VITALS — BP 135/72 | HR 68 | Ht 68.0 in | Wt 230.0 lb

## 2018-01-24 DIAGNOSIS — G4730 Sleep apnea, unspecified: Secondary | ICD-10-CM | POA: Diagnosis not present

## 2018-01-24 DIAGNOSIS — I25118 Atherosclerotic heart disease of native coronary artery with other forms of angina pectoris: Secondary | ICD-10-CM

## 2018-01-24 DIAGNOSIS — G4733 Obstructive sleep apnea (adult) (pediatric): Secondary | ICD-10-CM

## 2018-01-24 DIAGNOSIS — Z1211 Encounter for screening for malignant neoplasm of colon: Secondary | ICD-10-CM | POA: Diagnosis not present

## 2018-01-24 DIAGNOSIS — I200 Unstable angina: Secondary | ICD-10-CM | POA: Diagnosis not present

## 2018-01-24 DIAGNOSIS — I10 Essential (primary) hypertension: Secondary | ICD-10-CM | POA: Diagnosis not present

## 2018-01-24 MED ORDER — NITROGLYCERIN 0.4 MG SL SUBL: 0.4000 mg | SUBLINGUAL_TABLET | SUBLINGUAL | 1 refills | Status: DC | PRN

## 2018-01-24 NOTE — Assessment & Plan Note (Signed)
Discussed sleep apnea patient was unable to use CPAP machine has been recommended get back on this therapy will make an appointment with sleep specialist to evaluate.

## 2018-01-24 NOTE — Assessment & Plan Note (Signed)
Urinary artery disease angina exacerbation is been through multiple cardiac work-ups etiology appointment coming up in a few weeks.  Patient will try nitroglycerin isosorbide results cardiology to see if his exercise tolerance increases and is able to walk a little further without shortness of breath fatigue

## 2018-01-24 NOTE — Progress Notes (Signed)
BP 135/72   Pulse 68   Ht 5\' 8"  (1.727 m)   Wt 230 lb (104.3 kg)   SpO2 97%   BMI 34.97 kg/m    Subjective:    Patient ID: Darrell [name], male    DOB: 02-14-1946, 72 y.o.   MRN: [id number]  HPI: Darrell [name] is a 72 y.o. male  Chief Complaint  Patient presents with  . Follow-up  . Hypertension  Patient accompanied by his wife who assists with history. Blood pressure all in all doing well no complaints patient's biggest concern is ongoing shortness of breath with exertion.  This seems to be getting worse instead of better.  Has been through cardiac rehab and exercise tolerance seems to decline instead of increased. Patient has not tried nitroglycerin at sure if he is taking isosorbide mononitrate or not. No complaints from medications taking faithfully.  Relevant past medical, surgical, family and social history reviewed and updated as indicated. Interim medical history since our last visit reviewed. Allergies and medications reviewed and updated.  Review of Systems  Constitutional: Negative.   Respiratory: Negative.   Cardiovascular: Negative.     Per HPI unless specifically indicated above     Objective:    BP 135/72   Pulse 68   Ht 5\' 8"  (1.727 m)   Wt 230 lb (104.3 kg)   SpO2 97%   BMI 34.97 kg/m   Wt Readings from Last 3 Encounters:  01/24/18 230 lb (104.3 kg)  12/20/17 227 lb (103 kg)  12/14/17 228 lb (103.4 kg)    Physical Exam  Constitutional: He is oriented to person, place, and time. He appears well-developed and well-nourished.  HENT:  Head: Normocephalic and atraumatic.  Eyes: Conjunctivae and EOM are normal.  Neck: Normal range of motion.  Cardiovascular: Normal rate, regular rhythm and normal heart sounds.  Pulmonary/Chest: Effort normal and breath sounds normal.  Musculoskeletal: Normal range of motion.  Neurological: He is alert and oriented to person, place, and time.  Skin: No erythema.  Psychiatric: He has a normal mood and  affect. His behavior is normal. Judgment and thought content normal.    Results for orders placed or performed in visit on 12/20/17  Comprehensive metabolic panel  Result Value Ref Range   Glucose 88 65 - 99 mg/dL   BUN 28 (H) 8 - 27 mg/dL   Creatinine, Ser 1.48 (H) 0.76 - 1.27 mg/dL   GFR calc non Af Amer 47 (L) >59 mL/min/1.73   GFR calc Af Amer 55 (L) >59 mL/min/1.73   BUN/Creatinine Ratio 19 10 - 24   Sodium 137 134 - 144 mmol/L   Potassium 4.3 3.5 - 5.2 mmol/L   Chloride 98 96 - 106 mmol/L   CO2 23 20 - 29 mmol/L   Calcium 9.7 8.6 - 10.2 mg/dL   Total Protein 6.5 6.0 - 8.5 g/dL   Albumin 4.2 3.5 - 4.8 g/dL   Globulin, Total 2.3 1.5 - 4.5 g/dL   Albumin/Globulin Ratio 1.8 1.2 - 2.2   Bilirubin Total 0.9 0.0 - 1.2 mg/dL   Alkaline Phosphatase 123 (H) 39 - 117 IU/L   AST 24 0 - 40 IU/L   ALT 24 0 - 44 IU/L  Urinalysis, Routine w reflex microscopic  Result Value Ref Range   Specific Gravity, UA <1.005 (L) 1.005 - 1.030   pH, UA 5.0 5.0 - 7.5   Color, UA Yellow Yellow   Appearance Ur Clear Clear   Leukocytes, UA Negative  Negative   Protein, UA Negative Negative/Trace   Glucose, UA Negative Negative   Ketones, UA Negative Negative   RBC, UA Negative Negative   Bilirubin, UA Negative Negative   Urobilinogen, Ur 0.2 0.2 - 1.0 mg/dL   Nitrite, UA Negative Negative  CBC with Differential/Platelet  Result Value Ref Range   WBC 7.0 3.4 - 10.8 x10E3/uL   RBC 4.09 (L) 4.14 - 5.80 x10E6/uL   Hemoglobin 13.2 13.0 - 17.7 g/dL   Hematocrit 38.8 37.5 - 51.0 %   MCV 95 79 - 97 fL   MCH 32.3 26.6 - 33.0 pg   MCHC 34.0 31.5 - 35.7 g/dL   RDW 13.7 12.3 - 15.4 %   Platelets 185 150 - 379 x10E3/uL   Neutrophils 60 Not Estab. %   Lymphs 29 Not Estab. %   Monocytes 8 Not Estab. %   Eos 3 Not Estab. %   Basos 0 Not Estab. %   Neutrophils Absolute 4.2 1.4 - 7.0 x10E3/uL   Lymphocytes Absolute 2.1 0.7 - 3.1 x10E3/uL   Monocytes Absolute 0.5 0.1 - 0.9 x10E3/uL   EOS (ABSOLUTE) 0.2 0.0  - 0.4 x10E3/uL   Basophils Absolute 0.0 0.0 - 0.2 x10E3/uL   Immature Granulocytes 0 Not Estab. %   Immature Grans (Abs) 0.0 0.0 - 0.1 x10E3/uL  Lipid panel  Result Value Ref Range   Cholesterol, Total 134 100 - 199 mg/dL   Triglycerides 257 (H) 0 - 149 mg/dL   HDL 39 (L) >39 mg/dL   VLDL Cholesterol Cal 51 (H) 5 - 40 mg/dL   LDL Calculated 44 0 - 99 mg/dL   Chol/HDL Ratio 3.4 0.0 - 5.0 ratio  TSH  Result Value Ref Range   TSH 2.540 0.450 - 4.500 uIU/mL  PSA  Result Value Ref Range   Prostate Specific Ag, Serum 3.6 0.0 - 4.0 ng/mL      Assessment & Plan:   Problem List Items Addressed This Visit      Cardiovascular and Mediastinum   Hypertension    The current medical regimen is effective;  continue present plan and medications.       Relevant Medications   nitroGLYCERIN (NITROSTAT) 0.4 MG SL tablet   Coronary artery disease    Urinary artery disease angina exacerbation is been through multiple cardiac work-ups etiology appointment coming up in a few weeks.  Patient will try nitroglycerin isosorbide results cardiology to see if his exercise tolerance increases and is able to walk a little further without shortness of breath fatigue      Relevant Medications   nitroGLYCERIN (NITROSTAT) 0.4 MG SL tablet     Respiratory   Sleep apnea    Discussed sleep apnea patient was unable to use CPAP machine has been recommended get back on this therapy will make an appointment with sleep specialist to evaluate.      OSA (obstructive sleep apnea)   Relevant Orders   Ambulatory referral to Sleep Studies    Other Visit Diagnoses    Screening for colon cancer    -  Primary   Relevant Orders   Ambulatory referral to Gastroenterology       Follow up plan: Return if symptoms worsen or fail to improve, for As scheduled.

## 2018-01-24 NOTE — Assessment & Plan Note (Signed)
The current medical regimen is effective;  continue present plan and medications.  

## 2018-01-25 ENCOUNTER — Other Ambulatory Visit: Payer: Self-pay

## 2018-01-25 ENCOUNTER — Ambulatory Visit: Payer: Medicare Other | Admitting: Family Medicine

## 2018-01-25 DIAGNOSIS — Z1211 Encounter for screening for malignant neoplasm of colon: Secondary | ICD-10-CM

## 2018-01-26 ENCOUNTER — Other Ambulatory Visit: Payer: Self-pay

## 2018-02-01 ENCOUNTER — Other Ambulatory Visit: Payer: Self-pay | Admitting: Family Medicine

## 2018-02-03 NOTE — Telephone Encounter (Signed)
allopurinol refill Last Refill:12/20/17 #90 4 RF Last OV: 12/17/16 PCP: Dr Jeananne Rama Pharmacy:[facility] [address]

## 2018-02-04 ENCOUNTER — Inpatient Hospital Stay: Payer: Medicare Other

## 2018-02-04 ENCOUNTER — Inpatient Hospital Stay: Payer: Medicare Other | Admitting: Nurse Practitioner

## 2018-02-09 ENCOUNTER — Other Ambulatory Visit: Payer: Self-pay | Admitting: Family Medicine

## 2018-02-11 ENCOUNTER — Inpatient Hospital Stay: Payer: Medicare Other | Attending: Oncology | Admitting: Oncology

## 2018-02-11 ENCOUNTER — Inpatient Hospital Stay: Payer: Medicare Other

## 2018-02-11 VITALS — BP 115/63 | HR 69 | Temp 97.3°F | Resp 16 | Wt 224.9 lb

## 2018-02-11 DIAGNOSIS — I25118 Atherosclerotic heart disease of native coronary artery with other forms of angina pectoris: Secondary | ICD-10-CM | POA: Diagnosis not present

## 2018-02-11 DIAGNOSIS — I2510 Atherosclerotic heart disease of native coronary artery without angina pectoris: Secondary | ICD-10-CM

## 2018-02-11 DIAGNOSIS — L4050 Arthropathic psoriasis, unspecified: Secondary | ICD-10-CM | POA: Diagnosis not present

## 2018-02-11 DIAGNOSIS — I480 Paroxysmal atrial fibrillation: Secondary | ICD-10-CM | POA: Diagnosis not present

## 2018-02-11 DIAGNOSIS — I10 Essential (primary) hypertension: Secondary | ICD-10-CM | POA: Diagnosis not present

## 2018-02-11 DIAGNOSIS — Z79899 Other long term (current) drug therapy: Secondary | ICD-10-CM | POA: Diagnosis not present

## 2018-02-11 DIAGNOSIS — Z8546 Personal history of malignant neoplasm of prostate: Secondary | ICD-10-CM | POA: Diagnosis not present

## 2018-02-11 DIAGNOSIS — Z9049 Acquired absence of other specified parts of digestive tract: Secondary | ICD-10-CM | POA: Insufficient documentation

## 2018-02-11 DIAGNOSIS — Z87891 Personal history of nicotine dependence: Secondary | ICD-10-CM

## 2018-02-11 DIAGNOSIS — E785 Hyperlipidemia, unspecified: Secondary | ICD-10-CM | POA: Insufficient documentation

## 2018-02-11 DIAGNOSIS — I6529 Occlusion and stenosis of unspecified carotid artery: Secondary | ICD-10-CM | POA: Diagnosis not present

## 2018-02-11 DIAGNOSIS — E782 Mixed hyperlipidemia: Secondary | ICD-10-CM | POA: Diagnosis not present

## 2018-02-11 MED ORDER — TILDRAKIZUMAB-ASMN 100 MG/ML ~~LOC~~ SOSY
100.0000 mg | PREFILLED_SYRINGE | Freq: Once | SUBCUTANEOUS | Status: AC
  Administered 2018-02-11: 100 mg via SUBCUTANEOUS
  Filled 2018-02-11: qty 1

## 2018-02-11 NOTE — Progress Notes (Signed)
[place]  [phone]) [phone] [phone]) [phone]  ID: ALYUS MOFIELD OB: 1945-10-13  MR#: [id number]  RJJ#:[id number]  Patient Care Team: Guadalupe Maple, MD as PCP - General (Family Medicine) Lucky Cowboy, Erskine Squibb, MD as Referring Physician (Vascular Surgery) Royston Cowper, MD (Urology) Corey Skains, MD as Consulting Physician (Internal Medicine) Dasher, Rayvon Char, MD (Dermatology)  CHIEF COMPLAINT: Ilumya subcutaneous injection for psoriatic arthritis.  INTERVAL HISTORY: Patient presents today with long-standing history of psoriatic arthritis referred to [place] to receive subcutaneous injection IIumya. Currently he feels well and is asymptomatic.  He denies any recent fevers, illnesses, neurological complaints, chest pain or shortness of breath.  He has chronic joint pain.  He denies any nausea, vomiting, constipation or diarrhea.  He has no urinary complaints today.  REVIEW OF SYSTEMS:   Review of Systems  Constitutional: Negative.  Negative for chills, fever, malaise/fatigue and weight loss.  HENT: Negative for congestion and ear pain.   Eyes: Negative.  Negative for blurred vision and double vision.  Respiratory: Negative.  Negative for cough, sputum production and shortness of breath.   Cardiovascular: Negative.  Negative for chest pain, palpitations and leg swelling.  Gastrointestinal: Negative.  Negative for abdominal pain, constipation, diarrhea, nausea and vomiting.  Genitourinary: Negative for dysuria, frequency and urgency.  Musculoskeletal: Positive for joint pain (Chronic). Negative for back pain and falls.  Skin: Negative.  Negative for rash.  Neurological: Negative.  Negative for weakness and headaches.  Endo/Heme/Allergies: Negative.  Does not bruise/bleed easily.  Psychiatric/Behavioral: Negative.  Negative for depression. The patient is not nervous/anxious and does not have insomnia.     As per HPI. Otherwise, a complete review of systems is  negative.  PAST MEDICAL HISTORY: Past Medical History:  Diagnosis Date  . CAD (coronary artery disease)   . Cancer [facility])    prostate  . Carotid artery stenosis   . Hyperlipidemia   . Hypertension   . Psoriasis     PAST SURGICAL HISTORY: Past Surgical History:  Procedure Laterality Date  . CHOLECYSTECTOMY    . CORONARY ANGIOPLASTY WITH STENT PLACEMENT    . LEFT HEART CATH AND CORONARY ANGIOGRAPHY N/A 05/21/2017   Procedure: LEFT HEART CATH AND CORONARY ANGIOGRAPHY;  Surgeon: Teodoro Spray, MD;  Location: [facility];  Service: Cardiovascular;  Laterality: N/A;  . LIVER BIOPSY  1999  . SHOULDER SURGERY    . stents      FAMILY HISTORY: Family History  Problem Relation Age of Onset  . Heart attack Mother   . Diabetes Mother   . Stroke Mother   . Heart disease Mother   . Diabetes Brother   . Heart Problems Brother   . Diabetes Sister   . Lung cancer Maternal Aunt   . Esophageal cancer Sister     ADVANCED DIRECTIVES (Y/N):  N  HEALTH MAINTENANCE: Social History   Tobacco Use  . Smoking status: Former Smoker    Types: Cigarettes  . Smokeless tobacco: Never Used  . Tobacco comment: quit 30 years ago   Substance Use Topics  . Alcohol use: Yes    Alcohol/week: 0.6 oz    Types: 1 Cans of beer per week    Comment: occasioanlly   . Drug use: No     Colonoscopy:  PAP:  Bone density:  Lipid panel:  No Known Allergies  Current Outpatient Medications  Medication Sig Dispense Refill  . allopurinol (ZYLOPRIM) 300 MG tablet Take 0.5 tablets (150 mg total)  by mouth daily. 90 tablet 4  . apixaban (ELIQUIS) 5 MG TABS tablet Take by mouth.    Marland Kitchen atorvastatin (LIPITOR) 80 MG tablet Take 80 mg by mouth daily.    . carvedilol (COREG) 3.125 MG tablet   0  . clopidogrel (PLAVIX) 75 MG tablet Take 75 mg by mouth daily.    . hydrochlorothiazide (HYDRODIURIL) 25 MG tablet Take 1 tablet (25 mg total) by mouth daily. 90 tablet 4  . isosorbide mononitrate (IMDUR) 30 MG  24 hr tablet Take by mouth.    . naproxen (NAPROSYN) 500 MG tablet Take 1 tablet (500 mg total) 2 (two) times daily with a meal by mouth. 10 tablet 0  . nitroGLYCERIN (NITROSTAT) 0.4 MG SL tablet Place 1 tablet (0.4 mg total) under the tongue every 5 (five) minutes as needed for chest pain. 25 tablet 1  . telmisartan (MICARDIS) 40 MG tablet Take 40 mg by mouth daily.    Marland Kitchen allopurinol (ZYLOPRIM) 300 MG tablet TAKE 1 TABLET(300 MG) BY MOUTH DAILY 90 tablet 0   No current facility-administered medications for this visit.     OBJECTIVE: Vitals:   02/11/18 1338  BP: 115/63  Pulse: 69  Resp: 16  Temp: (!) 97.3 F (36.3 C)     Body mass index is 34.19 kg/m.    ECOG FS:0 - Asymptomatic  Physical Exam  Constitutional: He is oriented to person, place, and time and well-developed, well-nourished, and in no distress. Vital signs are normal.  HENT:  Head: Normocephalic and atraumatic.  Eyes: Pupils are equal, round, and reactive to light.  Neck: Normal range of motion.  Cardiovascular: Normal rate, regular rhythm and normal heart sounds.  No murmur heard. Pulmonary/Chest: Effort normal and breath sounds normal. He has no wheezes.  Abdominal: Soft. Normal appearance and bowel sounds are normal. He exhibits no distension. There is no tenderness.  Musculoskeletal: Normal range of motion. He exhibits no edema.  Neurological: He is alert and oriented to person, place, and time. Gait normal.  Skin: Skin is warm and dry. No rash noted.  Psychiatric: Mood, memory, affect and judgment normal.     LAB RESULTS:  Lab Results  Component Value Date   NA 137 12/20/2017   K 4.3 12/20/2017   CL 98 12/20/2017   CO2 23 12/20/2017   GLUCOSE 88 12/20/2017   BUN 28 (H) 12/20/2017   CREATININE 1.48 (H) 12/20/2017   CALCIUM 9.7 12/20/2017   PROT 6.5 12/20/2017   ALBUMIN 4.2 12/20/2017   AST 24 12/20/2017   ALT 24 12/20/2017   ALKPHOS 123 (H) 12/20/2017   BILITOT 0.9 12/20/2017   GFRNONAA 47 (L)  12/20/2017   GFRAA 55 (L) 12/20/2017    Lab Results  Component Value Date   WBC 7.0 12/20/2017   NEUTROABS 4.2 12/20/2017   HGB 13.2 12/20/2017   HCT 38.8 12/20/2017   MCV 95 12/20/2017   PLT 185 12/20/2017     STUDIES: No results found.  ASSESSMENT: Ilumya subcutaneous injection for psoriatic arthritis.  PLAN:    1. Ilumya subcutaneous injection for psoriatic arthritis: Proceed with injection today.  He was offered to give subcutaneous injections at home but would prefer coming to see [place] every 3 months for injections.  Any questions regarding treatment should be addressed by his physician Dr. Evorn Gong.   RTC in 80months with IIumya injections and in 6 months with injection and MD assessment.   Greater than 50% was spent in counseling and coordination of care with  this patient including but not limited to discussion of the relevant topics above (See A&P) including, but not limited to diagnosis and management of acute and chronic medical conditions.   Faythe Casa, NP 02/11/2018 1:56 PM

## 2018-02-11 NOTE — Telephone Encounter (Signed)
Interface refill request for ZYLOPRIM 300MG     LOV related medication?   Last refill:  02/03/18   PCP:  Golden Pop, MD  Pharmacy:  [facility]

## 2018-02-14 ENCOUNTER — Telehealth (INDEPENDENT_AMBULATORY_CARE_PROVIDER_SITE_OTHER): Payer: Self-pay

## 2018-02-14 NOTE — Telephone Encounter (Signed)
The patient called stating that he would like to get his records from where he has seen Dr. Lucky Cowboy in the past to the most recent.  I called the patient back to let him know that there is no charge, but he would have to sign a release of information to himself in order to pick these records up, I did not get an answer and I did not leave a message.

## 2018-02-16 ENCOUNTER — Encounter: Payer: Self-pay | Admitting: Family Medicine

## 2018-02-16 ENCOUNTER — Ambulatory Visit (INDEPENDENT_AMBULATORY_CARE_PROVIDER_SITE_OTHER): Payer: Medicare Other | Admitting: Family Medicine

## 2018-02-16 VITALS — BP 93/56 | HR 68 | Ht 67.32 in | Wt 227.0 lb

## 2018-02-16 DIAGNOSIS — I10 Essential (primary) hypertension: Secondary | ICD-10-CM

## 2018-02-16 DIAGNOSIS — I6523 Occlusion and stenosis of bilateral carotid arteries: Secondary | ICD-10-CM | POA: Diagnosis not present

## 2018-02-16 DIAGNOSIS — E785 Hyperlipidemia, unspecified: Secondary | ICD-10-CM

## 2018-02-16 DIAGNOSIS — L409 Psoriasis, unspecified: Secondary | ICD-10-CM

## 2018-02-16 DIAGNOSIS — Z7189 Other specified counseling: Secondary | ICD-10-CM | POA: Diagnosis not present

## 2018-02-16 DIAGNOSIS — I25118 Atherosclerotic heart disease of native coronary artery with other forms of angina pectoris: Secondary | ICD-10-CM | POA: Diagnosis not present

## 2018-02-16 MED ORDER — HYDROCHLOROTHIAZIDE 25 MG PO TABS: 25.0000 mg | ORAL_TABLET | Freq: Every day | ORAL | 4 refills | Status: AC

## 2018-02-16 MED ORDER — ALLOPURINOL 300 MG PO TABS: 150.0000 mg | ORAL_TABLET | Freq: Every day | ORAL | 4 refills | Status: DC

## 2018-02-16 NOTE — Assessment & Plan Note (Signed)
The current medical regimen is effective;  continue present plan and medications.  

## 2018-02-16 NOTE — Assessment & Plan Note (Signed)
W/u pending

## 2018-02-16 NOTE — Assessment & Plan Note (Signed)
A voluntary discussion about advanced care planning including explanation and discussion of advanced directives was extentively discussed with the patient.  Explained about the healthcare proxy and living will was reviewed and packet with forms with expiration of how to fill them out was given.  Time spent: Encounter 16+ min individuals present: Patient 

## 2018-02-16 NOTE — Assessment & Plan Note (Signed)
Discussed low blood pressure for now will increase hydration fluid status patient will start checking his blood pressure at home and observing and also will decrease Micardis from 40 mg to 20 mg and observe blood pressure response.

## 2018-02-16 NOTE — Progress Notes (Signed)
BP (!) 93/56   Pulse 68   Ht 5' 7.32" (1.71 m)   Wt 227 lb (103 kg)   SpO2 96%   BMI 35.21 kg/m    Subjective:    Patient ID: Darrell [name], male    DOB: 1946/05/03, 71 y.o.   MRN: [id number]  HPI: Darrell [name] is a 72 y.o. male  Chief Complaint  Patient presents with  . Annual Exam   Patient follow-up doing well all in all has stress test scheduled next week with cardiology otherwise been doing well no complaints.  Maybe was a little dizzy and got hot and worked outside yesterday in the heat. Blood pressure noted to be a little low today. Taking blood pressure medicines without problems Cholesterol also without problems along with other medications.  Did try nitroglycerin before activity and may be helped.  No gout symptoms. Relevant past medical, surgical, family and social history reviewed and updated as indicated. Interim medical history since our last visit reviewed. Allergies and medications reviewed and updated.  Review of Systems  Constitutional: Negative.   HENT: Negative.   Eyes: Negative.   Respiratory: Negative.   Cardiovascular: Negative.   Gastrointestinal: Negative.   Endocrine: Negative.   Genitourinary: Negative.   Musculoskeletal: Negative.   Skin: Negative.   Allergic/Immunologic: Negative.   Neurological: Negative.   Hematological: Negative.   Psychiatric/Behavioral: Negative.     Per HPI unless specifically indicated above     Objective:    BP (!) 93/56   Pulse 68   Ht 5' 7.32" (1.71 m)   Wt 227 lb (103 kg)   SpO2 96%   BMI 35.21 kg/m   Wt Readings from Last 3 Encounters:  02/16/18 227 lb (103 kg)  02/11/18 224 lb 13.9 oz (102 kg)  01/24/18 230 lb (104.3 kg)    Physical Exam  Constitutional: He is oriented to person, place, and time. He appears well-developed and well-nourished.  HENT:  Head: Normocephalic and atraumatic.  Right Ear: External ear normal.  Left Ear: External ear normal.  Eyes: Pupils are equal, round,  and reactive to light. Conjunctivae and EOM are normal.  Neck: Normal range of motion. Neck supple.  Cardiovascular: Normal rate, regular rhythm, normal heart sounds and intact distal pulses.  Pulmonary/Chest: Effort normal and breath sounds normal.  Abdominal: Soft. Bowel sounds are normal. There is no splenomegaly or hepatomegaly.  Genitourinary: Rectum normal, prostate normal and penis normal.  Musculoskeletal: Normal range of motion.  Neurological: He is alert and oriented to person, place, and time. He has normal reflexes.  Skin: No rash noted. No erythema.  Psychiatric: He has a normal mood and affect. His behavior is normal. Judgment and thought content normal.    Results for orders placed or performed in visit on 12/20/17  Comprehensive metabolic panel  Result Value Ref Range   Glucose 88 65 - 99 mg/dL   BUN 28 (H) 8 - 27 mg/dL   Creatinine, Ser 1.48 (H) 0.76 - 1.27 mg/dL   GFR calc non Af Amer 47 (L) >59 mL/min/1.73   GFR calc Af Amer 55 (L) >59 mL/min/1.73   BUN/Creatinine Ratio 19 10 - 24   Sodium 137 134 - 144 mmol/L   Potassium 4.3 3.5 - 5.2 mmol/L   Chloride 98 96 - 106 mmol/L   CO2 23 20 - 29 mmol/L   Calcium 9.7 8.6 - 10.2 mg/dL   Total Protein 6.5 6.0 - 8.5 g/dL   Albumin 4.2 3.5 -  4.8 g/dL   Globulin, Total 2.3 1.5 - 4.5 g/dL   Albumin/Globulin Ratio 1.8 1.2 - 2.2   Bilirubin Total 0.9 0.0 - 1.2 mg/dL   Alkaline Phosphatase 123 (H) 39 - 117 IU/L   AST 24 0 - 40 IU/L   ALT 24 0 - 44 IU/L  Urinalysis, Routine w reflex microscopic  Result Value Ref Range   Specific Gravity, UA <1.005 (L) 1.005 - 1.030   pH, UA 5.0 5.0 - 7.5   Color, UA Yellow Yellow   Appearance Ur Clear Clear   Leukocytes, UA Negative Negative   Protein, UA Negative Negative/Trace   Glucose, UA Negative Negative   Ketones, UA Negative Negative   RBC, UA Negative Negative   Bilirubin, UA Negative Negative   Urobilinogen, Ur 0.2 0.2 - 1.0 mg/dL   Nitrite, UA Negative Negative  CBC with  Differential/Platelet  Result Value Ref Range   WBC 7.0 3.4 - 10.8 x10E3/uL   RBC 4.09 (L) 4.14 - 5.80 x10E6/uL   Hemoglobin 13.2 13.0 - 17.7 g/dL   Hematocrit 38.8 37.5 - 51.0 %   MCV 95 79 - 97 fL   MCH 32.3 26.6 - 33.0 pg   MCHC 34.0 31.5 - 35.7 g/dL   RDW 13.7 12.3 - 15.4 %   Platelets 185 150 - 379 x10E3/uL   Neutrophils 60 Not Estab. %   Lymphs 29 Not Estab. %   Monocytes 8 Not Estab. %   Eos 3 Not Estab. %   Basos 0 Not Estab. %   Neutrophils Absolute 4.2 1.4 - 7.0 x10E3/uL   Lymphocytes Absolute 2.1 0.7 - 3.1 x10E3/uL   Monocytes Absolute 0.5 0.1 - 0.9 x10E3/uL   EOS (ABSOLUTE) 0.2 0.0 - 0.4 x10E3/uL   Basophils Absolute 0.0 0.0 - 0.2 x10E3/uL   Immature Granulocytes 0 Not Estab. %   Immature Grans (Abs) 0.0 0.0 - 0.1 x10E3/uL  Lipid panel  Result Value Ref Range   Cholesterol, Total 134 100 - 199 mg/dL   Triglycerides 257 (H) 0 - 149 mg/dL   HDL 39 (L) >39 mg/dL   VLDL Cholesterol Cal 51 (H) 5 - 40 mg/dL   LDL Calculated 44 0 - 99 mg/dL   Chol/HDL Ratio 3.4 0.0 - 5.0 ratio  TSH  Result Value Ref Range   TSH 2.540 0.450 - 4.500 uIU/mL  PSA  Result Value Ref Range   Prostate Specific Ag, Serum 3.6 0.0 - 4.0 ng/mL      Assessment & Plan:   Problem List Items Addressed This Visit      Cardiovascular and Mediastinum   Hypertension    Discussed low blood pressure for now will increase hydration fluid status patient will start checking his blood pressure at home and observing and also will decrease Micardis from 40 mg to 20 mg and observe blood pressure response.      Relevant Medications   hydrochlorothiazide (HYDRODIURIL) 25 MG tablet   CAD (coronary artery disease)    W/u pending      Relevant Medications   hydrochlorothiazide (HYDRODIURIL) 25 MG tablet   Carotid stenosis   Relevant Medications   allopurinol (ZYLOPRIM) 300 MG tablet   hydrochlorothiazide (HYDRODIURIL) 25 MG tablet     Musculoskeletal and Integument   Psoriasis    The current medical  regimen is effective;  continue present plan and medications.         Other   Hyperlipidemia    The current medical regimen is effective;  continue  present plan and medications.       Relevant Medications   hydrochlorothiazide (HYDRODIURIL) 25 MG tablet   Advanced care planning/counseling discussion - Primary    A voluntary discussion about advanced care planning including explanation and discussion of advanced directives was extentively discussed with the patient.  Explained about the healthcare proxy and living will was reviewed and packet with forms with expiration of how to fill them out was given.  Time spent: Encounter 16+ min individuals present: Patient          Follow up plan: Return in about 6 months (around 08/19/2018) for BMP,  Lipids, ALT, AST.

## 2018-02-24 DIAGNOSIS — I25118 Atherosclerotic heart disease of native coronary artery with other forms of angina pectoris: Secondary | ICD-10-CM | POA: Diagnosis not present

## 2018-03-09 DIAGNOSIS — I480 Paroxysmal atrial fibrillation: Secondary | ICD-10-CM | POA: Diagnosis not present

## 2018-03-09 DIAGNOSIS — E782 Mixed hyperlipidemia: Secondary | ICD-10-CM | POA: Diagnosis not present

## 2018-03-09 DIAGNOSIS — I10 Essential (primary) hypertension: Secondary | ICD-10-CM | POA: Diagnosis not present

## 2018-03-09 DIAGNOSIS — I25118 Atherosclerotic heart disease of native coronary artery with other forms of angina pectoris: Secondary | ICD-10-CM | POA: Diagnosis not present

## 2018-03-09 DIAGNOSIS — G4733 Obstructive sleep apnea (adult) (pediatric): Secondary | ICD-10-CM | POA: Diagnosis not present

## 2018-03-09 DIAGNOSIS — I255 Ischemic cardiomyopathy: Secondary | ICD-10-CM | POA: Diagnosis not present

## 2018-03-16 ENCOUNTER — Telehealth: Payer: Self-pay | Admitting: Gastroenterology

## 2018-03-16 ENCOUNTER — Other Ambulatory Visit: Payer: Self-pay

## 2018-03-16 ENCOUNTER — Telehealth: Payer: Self-pay

## 2018-03-16 NOTE — Telephone Encounter (Signed)
PT left vm he has not received his instructions on when he is supposed to stop rx and what time his procedure is please call pt

## 2018-03-16 NOTE — Telephone Encounter (Signed)
Patient contacted office to see when he should stop Plavix.  Blood Thinner request has been sent to Dr. Durenda Age office.  Thanks [employer]

## 2018-03-18 ENCOUNTER — Telehealth: Payer: Self-pay | Admitting: Gastroenterology

## 2018-03-18 ENCOUNTER — Telehealth: Payer: Self-pay

## 2018-03-18 NOTE — Telephone Encounter (Signed)
Pt is calling to ask Sharyn Lull a QUESTION ABOUT HIS BLOOD THINNER RX FOR HIS PROCEDURE 03/25/18

## 2018-03-18 NOTE — Telephone Encounter (Signed)
Patient and wife contacted office this afternoon because they were unsure of stop date for Plavix.  I advised patients wife the blood thinner request was sent to Dr. Rance Muir office electronically on 07/31 and I had not received it back yet.  I contacted Dr. Rance Muir office spoke with Rulon Abide who transferred me to a nurse and she saw the blood thinner request letter in epic, printed it  and said she would fax back to [place].  I did not receive it.  Contacted patients wife to let her know that I discussed with Dr. Rance Muir office who said they would get form to me and I've just not received it.  She said to send it to Dr. [name] because he prescribed it.  Blood thinner request was sent to Dr. Alveria Apley office shortly after 3.  Unable to contact Dr. Alveria Apley office because they close early on Fridays.    Patient is still on the schedule for his colonoscopy on 08/09 and I will contact both offices again on Monday morning.  Patient has been advised do not stop blood thinner until she hears back from our office staff to do so.

## 2018-03-21 ENCOUNTER — Telehealth: Payer: Self-pay

## 2018-03-21 DIAGNOSIS — Z85820 Personal history of malignant melanoma of skin: Secondary | ICD-10-CM | POA: Diagnosis not present

## 2018-03-21 DIAGNOSIS — D485 Neoplasm of uncertain behavior of skin: Secondary | ICD-10-CM | POA: Diagnosis not present

## 2018-03-21 DIAGNOSIS — Z08 Encounter for follow-up examination after completed treatment for malignant neoplasm: Secondary | ICD-10-CM | POA: Diagnosis not present

## 2018-03-21 DIAGNOSIS — X32XXXA Exposure to sunlight, initial encounter: Secondary | ICD-10-CM | POA: Diagnosis not present

## 2018-03-21 DIAGNOSIS — D0461 Carcinoma in situ of skin of right upper limb, including shoulder: Secondary | ICD-10-CM | POA: Diagnosis not present

## 2018-03-21 DIAGNOSIS — L570 Actinic keratosis: Secondary | ICD-10-CM | POA: Diagnosis not present

## 2018-03-21 DIAGNOSIS — Z85828 Personal history of other malignant neoplasm of skin: Secondary | ICD-10-CM | POA: Diagnosis not present

## 2018-03-21 DIAGNOSIS — L400 Psoriasis vulgaris: Secondary | ICD-10-CM | POA: Diagnosis not present

## 2018-03-21 NOTE — Telephone Encounter (Signed)
Blood thinner request was received this am from [facility].  Patients wife has been advised per Merrie Roof PA-C that her husband should stop Plavix 2 days prior to procedure and restart blood thinner 2 days after procedure.  Thank you,  Sharyn Lull

## 2018-03-25 ENCOUNTER — Ambulatory Visit: Payer: Medicare Other | Admitting: Anesthesiology

## 2018-03-25 ENCOUNTER — Other Ambulatory Visit: Payer: Self-pay

## 2018-03-25 ENCOUNTER — Encounter
Admission: RE | Disposition: A | Discharge status: Home / Self Care | Payer: Self-pay | Source: Ambulatory Visit | Attending: Gastroenterology

## 2018-03-25 ENCOUNTER — Ambulatory Visit
Admission: RE | Admit: 2018-03-25 | Discharge: 2018-03-25 | Disposition: A | Discharge status: Home / Self Care | Payer: Medicare Other | Source: Ambulatory Visit | Attending: Gastroenterology | Admitting: Gastroenterology

## 2018-03-25 ENCOUNTER — Encounter: Payer: Self-pay | Admitting: *Deleted

## 2018-03-25 DIAGNOSIS — Z1211 Encounter for screening for malignant neoplasm of colon: Secondary | ICD-10-CM | POA: Diagnosis not present

## 2018-03-25 DIAGNOSIS — Z7902 Long term (current) use of antithrombotics/antiplatelets: Secondary | ICD-10-CM | POA: Insufficient documentation

## 2018-03-25 DIAGNOSIS — K640 First degree hemorrhoids: Secondary | ICD-10-CM | POA: Insufficient documentation

## 2018-03-25 DIAGNOSIS — D123 Benign neoplasm of transverse colon: Secondary | ICD-10-CM | POA: Diagnosis not present

## 2018-03-25 DIAGNOSIS — E785 Hyperlipidemia, unspecified: Secondary | ICD-10-CM | POA: Insufficient documentation

## 2018-03-25 DIAGNOSIS — K648 Other hemorrhoids: Secondary | ICD-10-CM | POA: Diagnosis not present

## 2018-03-25 DIAGNOSIS — L409 Psoriasis, unspecified: Secondary | ICD-10-CM | POA: Insufficient documentation

## 2018-03-25 DIAGNOSIS — Z8546 Personal history of malignant neoplasm of prostate: Secondary | ICD-10-CM | POA: Diagnosis not present

## 2018-03-25 DIAGNOSIS — I10 Essential (primary) hypertension: Secondary | ICD-10-CM | POA: Insufficient documentation

## 2018-03-25 DIAGNOSIS — D125 Benign neoplasm of sigmoid colon: Secondary | ICD-10-CM | POA: Insufficient documentation

## 2018-03-25 DIAGNOSIS — G4733 Obstructive sleep apnea (adult) (pediatric): Secondary | ICD-10-CM | POA: Diagnosis not present

## 2018-03-25 DIAGNOSIS — Z87891 Personal history of nicotine dependence: Secondary | ICD-10-CM | POA: Diagnosis not present

## 2018-03-25 DIAGNOSIS — I2510 Atherosclerotic heart disease of native coronary artery without angina pectoris: Secondary | ICD-10-CM | POA: Insufficient documentation

## 2018-03-25 DIAGNOSIS — K5730 Diverticulosis of large intestine without perforation or abscess without bleeding: Secondary | ICD-10-CM | POA: Diagnosis not present

## 2018-03-25 DIAGNOSIS — D126 Benign neoplasm of colon, unspecified: Secondary | ICD-10-CM | POA: Diagnosis not present

## 2018-03-25 DIAGNOSIS — D120 Benign neoplasm of cecum: Secondary | ICD-10-CM | POA: Diagnosis not present

## 2018-03-25 DIAGNOSIS — K635 Polyp of colon: Secondary | ICD-10-CM | POA: Diagnosis not present

## 2018-03-25 DIAGNOSIS — K5790 Diverticulosis of intestine, part unspecified, without perforation or abscess without bleeding: Secondary | ICD-10-CM | POA: Diagnosis not present

## 2018-03-25 DIAGNOSIS — Z79899 Other long term (current) drug therapy: Secondary | ICD-10-CM | POA: Insufficient documentation

## 2018-03-25 HISTORY — PX: COLONOSCOPY WITH PROPOFOL: SHX5780

## 2018-03-25 SURGERY — COLONOSCOPY WITH PROPOFOL
Anesthesia: General

## 2018-03-25 MED ORDER — PROPOFOL 500 MG/50ML IV EMUL
INTRAVENOUS | Status: AC
  Filled 2018-03-25: qty 50

## 2018-03-25 MED ORDER — LIDOCAINE HCL (CARDIAC) PF 100 MG/5ML IV SOSY
PREFILLED_SYRINGE | INTRAVENOUS | Status: DC | PRN
  Administered 2018-03-25: 40 mg via INTRAVENOUS

## 2018-03-25 MED ORDER — SODIUM CHLORIDE 0.9 % IV SOLN
INTRAVENOUS | Status: DC
  Administered 2018-03-25: 09:00:00 via INTRAVENOUS

## 2018-03-25 MED ORDER — PHENYLEPHRINE HCL 10 MG/ML IJ SOLN
INTRAMUSCULAR | Status: DC | PRN
  Administered 2018-03-25 (×3): 100 ug via INTRAVENOUS

## 2018-03-25 MED ORDER — PROPOFOL 500 MG/50ML IV EMUL
INTRAVENOUS | Status: DC | PRN
  Administered 2018-03-25: 175 ug/kg/min via INTRAVENOUS

## 2018-03-25 MED ORDER — PROPOFOL 10 MG/ML IV BOLUS
INTRAVENOUS | Status: DC | PRN
  Administered 2018-03-25: 80 mg via INTRAVENOUS
  Administered 2018-03-25: 20 mg via INTRAVENOUS

## 2018-03-25 NOTE — H&P (Signed)
Jonathon Bellows, MD [address], [place], [place], [place], [address] [address], [place], [place], [place], [address] Phone: [phone]  Fax: [phone]  Primary Care Physician:  Guadalupe Maple, MD   Pre-Procedure History & Physical: HPI:  Darrell [name] is a 72 y.o. male is here for an colonoscopy.   Past Medical History:  Diagnosis Date  . CAD (coronary artery disease)   . Cancer [facility])    prostate  . Carotid artery stenosis   . Hyperlipidemia   . Hypertension   . Psoriasis     Past Surgical History:  Procedure Laterality Date  . CHOLECYSTECTOMY    . COLONOSCOPY    . CORONARY ANGIOPLASTY WITH STENT PLACEMENT    . LEFT HEART CATH AND CORONARY ANGIOGRAPHY N/A 05/21/2017   Procedure: LEFT HEART CATH AND CORONARY ANGIOGRAPHY;  Surgeon: Teodoro Spray, MD;  Location: [facility];  Service: Cardiovascular;  Laterality: N/A;  . LIVER BIOPSY  1999  . SHOULDER SURGERY    . stents      Prior to Admission medications   Medication Sig Start Date End Date Taking? Authorizing Provider  allopurinol (ZYLOPRIM) 300 MG tablet Take 0.5 tablets (150 mg total) by mouth daily. 02/16/18  Yes Guadalupe Maple, MD  apixaban (ELIQUIS) 5 MG TABS tablet Take by mouth. 12/07/17  Yes [provider]  atorvastatin (LIPITOR) 80 MG tablet Take 80 mg by mouth daily.   Yes [provider]  carvedilol (COREG) 3.125 MG tablet  12/08/17  Yes [provider]  hydrochlorothiazide (HYDRODIURIL) 25 MG tablet Take 1 tablet (25 mg total) by mouth daily. 02/16/18  Yes Guadalupe Maple, MD  isosorbide mononitrate (IMDUR) 30 MG 24 hr tablet Take by mouth. 12/09/17 12/09/18 Yes [provider]  nitroGLYCERIN (NITROSTAT) 0.4 MG SL tablet Place 1 tablet (0.4 mg total) under the tongue every 5 (five) minutes as needed for chest pain. 01/24/18  Yes Crissman, Jeannette How, MD  telmisartan (MICARDIS) 40 MG tablet Take 40 mg by mouth daily.   Yes [provider]    clopidogrel (PLAVIX) 75 MG tablet Take 75 mg by mouth daily.    [provider]    Allergies as of 01/25/2018  . (No Known Allergies)    Family History  Problem Relation Age of Onset  . Heart attack Mother   . Diabetes Mother   . Stroke Mother   . Heart disease Mother   . Diabetes Brother   . Heart Problems Brother   . Diabetes Sister   . Lung cancer Maternal Aunt   . Esophageal cancer Sister     Social History   Socioeconomic History  . Marital status: Married    Spouse name: Not on file  . Number of children: Not on file  . Years of education: Not on file  . Highest education level: Not on file  Occupational History  . Not on file  Social Needs  . Financial resource strain: Not on file  . Food insecurity:    Worry: Not on file    Inability: Not on file  . Transportation needs:    Medical: Not on file    Non-medical: Not on file  Tobacco Use  . Smoking status: Former Smoker    Types: Cigarettes  . Smokeless tobacco: Never Used  . Tobacco comment: quit 30 years ago   Substance and Sexual Activity  . Alcohol use: Yes    Alcohol/week: 1.0 standard drinks  Types: 1 Cans of beer per week    Comment: occasioanlly   . Drug use: No  . Sexual activity: Not on file  Lifestyle  . Physical activity:    Days per week: Not on file    Minutes per session: Not on file  . Stress: Not on file  Relationships  . Social connections:    Talks on phone: Not on file    Gets together: Not on file    Attends religious service: Not on file    Active member of club or organization: Not on file    Attends meetings of clubs or organizations: Not on file    Relationship status: Not on file  . Intimate partner violence:    Fear of current or ex partner: Not on file    Emotionally abused: Not on file    Physically abused: Not on file    Forced sexual activity: Not on file  Other Topics Concern  . Not on file  Social History Narrative  . Not on file    Review of  Systems: See HPI, otherwise negative ROS  Physical Exam: BP 101/66   Pulse 66   Temp (!) 97 F (36.1 C) (Tympanic)   Resp 16   Ht 5\' 9"  (1.753 m)   Wt 96.2 kg   SpO2 99%   BMI 31.31 kg/m  General:   Alert,  pleasant and cooperative in NAD Head:  Normocephalic and atraumatic. Neck:  Supple; no masses or thyromegaly. Lungs:  Clear throughout to auscultation, normal respiratory effort.    Heart:  +S1, +S2, Regular rate and rhythm, No edema. Abdomen:  Soft, nontender and nondistended. Normal bowel sounds, without guarding, and without rebound.   Neurologic:  Alert and  oriented x4;  grossly normal neurologically.  Impression/Plan: Darrell [name] is here for an colonoscopy to be performed for Screening colonoscopy average risk   Risks, benefits, limitations, and alternatives regarding  colonoscopy have been reviewed with the patient.  Questions have been answered.  All parties agreeable.   Jonathon Bellows, MD  03/25/2018, 8:58 AM

## 2018-03-25 NOTE — Anesthesia Procedure Notes (Signed)
Date/Time: 03/25/2018 9:04 AM Performed by: Johnna Acosta, CRNA Pre-anesthesia Checklist: Patient identified, Emergency Drugs available, Suction available, Patient being monitored and Timeout performed Patient Re-evaluated:Patient Re-evaluated prior to induction Oxygen Delivery Method: Nasal cannula Preoxygenation: Pre-oxygenation with 100% oxygen

## 2018-03-25 NOTE — Op Note (Signed)
[facility] Gastroenterology Patient Name: Darrell [name] Procedure Date: 03/25/2018 8:57 AM MRN: [id number] Account #: [id number] Date of Birth: 06/29/1946 Admit Type: Inpatient Age: 72 Room: [facility] ENDO ROOM 4 Gender: Male Note Status: Finalized Procedure:            Colonoscopy Indications:          Screening for colorectal malignant neoplasm Providers:            Jonathon Bellows MD, MD Referring MD:         Guadalupe Maple, MD (Referring MD) Medicines:            Monitored Anesthesia Care Complications:        No immediate complications. Procedure:            Pre-Anesthesia Assessment:                       - Prior to the procedure, a History and Physical was                        performed, and patient medications, allergies and                        sensitivities were reviewed. The patient's tolerance of                        previous anesthesia was reviewed.                       - The risks and benefits of the procedure and the                        sedation options and risks were discussed with the                        patient. All questions were answered and informed                        consent was obtained.                       - ASA Grade Assessment: II - A patient with mild                        systemic disease.                       After obtaining informed consent, the colonoscope was                        passed under direct vision. Throughout the procedure,                        the patient's blood pressure, pulse, and oxygen                        saturations were monitored continuously. The                        Colonoscope was introduced through the anus and  advanced to the the cecum, identified by the                        appendiceal orifice, IC valve and transillumination.                        The colonoscopy was performed with ease. The patient                        tolerated the procedure well. The  quality of the bowel                        preparation was good. Findings:      The perianal and digital rectal examinations were normal.      Two sessile polyps were found in the transverse colon and cecum. The       polyps were 3 to 4 mm in size. These polyps were removed with a cold       biopsy forceps. Resection and retrieval were complete.      Two sessile polyps were found in the transverse colon and cecum. The       polyps were 4 to 6 mm in size. These polyps were removed with a cold       snare. Resection and retrieval were complete.      Two sessile polyps were found in the sigmoid colon. The polyps were 4 to       5 mm in size. These polyps were removed with a cold snare. Resection and       retrieval were complete.      Multiple small-mouthed diverticula were found in the sigmoid colon.      Non-bleeding internal hemorrhoids were found during retroflexion. The       hemorrhoids were medium-sized and Grade I (internal hemorrhoids that do       not prolapse).      The exam was otherwise without abnormality on direct and retroflexion       views. Impression:           - Two 3 to 4 mm polyps in the transverse colon and in                        the cecum, removed with a cold biopsy forceps. Resected                        and retrieved.                       - Two 4 to 6 mm polyps in the transverse colon and in                        the cecum, removed with a cold snare. Resected and                        retrieved.                       - Two 4 to 5 mm polyps in the sigmoid colon, removed                        with a cold snare. Resected and retrieved.                       -  Diverticulosis in the sigmoid colon.                       - Non-bleeding internal hemorrhoids.                       - The examination was otherwise normal on direct and                        retroflexion views. Recommendation:       - Discharge patient to home (with escort).                       -  Resume previous diet.                       - Resume Eliquis (apixaban) at prior dose tomorrow.                       - Await pathology results.                       - Repeat colonoscopy in 3 - 5 years for surveillance                        based on pathology results. Procedure Code(s):    --- Professional ---                       [phone], Colonoscopy, flexible; with removal of tumor(s),                        polyp(s), or other lesion(s) by snare technique                       45380, 89, Colonoscopy, flexible; with biopsy, single                        or multiple Diagnosis Code(s):    --- Professional ---                       Z12.11, Encounter for screening for malignant neoplasm                        of colon                       D12.3, Benign neoplasm of transverse colon (hepatic                        flexure or splenic flexure)                       D12.0, Benign neoplasm of cecum                       D12.5, Benign neoplasm of sigmoid colon                       K64.0, First degree hemorrhoids                       K57.30, Diverticulosis of large intestine without  perforation or abscess without bleeding CPT copyright 2017 American Medical Association. All rights reserved. The codes documented in this report are preliminary and upon coder review may  be revised to meet current compliance requirements. Jonathon Bellows, MD Jonathon Bellows MD, MD 03/25/2018 9:41:52 AM This report has been signed electronically. Number of Addenda: 0 Note Initiated On: 03/25/2018 8:57 AM Scope Withdrawal Time: 0 hours 21 minutes 34 seconds  Total Procedure Duration: 0 hours 23 minutes 46 seconds       [facility]

## 2018-03-25 NOTE — Anesthesia Postprocedure Evaluation (Signed)
Anesthesia Post Note  Patient: Darrell [name]  Procedure(s) Performed: COLONOSCOPY WITH PROPOFOL (N/A )  Patient location during evaluation: Endoscopy Anesthesia Type: General Level of consciousness: awake and alert Pain management: pain level controlled Vital Signs Assessment: post-procedure vital signs reviewed and stable Respiratory status: spontaneous breathing, nonlabored ventilation, respiratory function stable and patient connected to nasal cannula oxygen Cardiovascular status: blood pressure returned to baseline and stable Postop Assessment: no apparent nausea or vomiting Anesthetic complications: no     Last Vitals:  Vitals:   03/25/18 0957 03/25/18 1007  BP: (!) 106/57 119/61  Pulse: 67 61  Resp: 15 16  Temp:    SpO2: 94% 95%    Last Pain:  Vitals:   03/25/18 1007  TempSrc:   PainSc: 0-No pain                 Kynzie Polgar S

## 2018-03-25 NOTE — Anesthesia Post-op Follow-up Note (Signed)
Anesthesia QCDR form completed.        

## 2018-03-25 NOTE — Transfer of Care (Signed)
Immediate Anesthesia Transfer of Care Note  Patient: Darrell [name]  Procedure(s) Performed: COLONOSCOPY WITH PROPOFOL (N/A )  Patient Location: PACU  Anesthesia Type:General  Level of Consciousness: sedated  Airway & Oxygen Therapy: Patient Spontanous Breathing and Patient connected to nasal cannula oxygen  Post-op Assessment: Report given to RN and Post -op Vital signs reviewed and stable  Post vital signs: Reviewed and stable  Last Vitals:  Vitals Value Taken Time  BP 87/50 03/25/2018  9:40 AM  Temp 36.4 C 03/25/2018  9:37 AM  Pulse 68 03/25/2018  9:40 AM  Resp 16 03/25/2018  9:40 AM  SpO2 98 % 03/25/2018  9:40 AM  Vitals shown include unvalidated device data.  Last Pain:  Vitals:   03/25/18 0937  TempSrc: Tympanic  PainSc: Asleep         Complications: No apparent anesthesia complications

## 2018-03-25 NOTE — Anesthesia Preprocedure Evaluation (Signed)
Anesthesia Evaluation  Patient identified by MRN, date of birth, ID band Patient awake    Reviewed: Allergy & Precautions, NPO status , Patient's Chart, lab work & pertinent test results, reviewed documented beta blocker date and time   Airway Mallampati: III  TM Distance: >3 FB     Dental  (+) Chipped   Pulmonary sleep apnea , former smoker,           Cardiovascular hypertension, Pt. on medications and Pt. on home beta blockers + CAD, + Cardiac Stents and + Peripheral Vascular Disease  + dysrhythmias Atrial Fibrillation      Neuro/Psych    GI/Hepatic   Endo/Other    Renal/GU      Musculoskeletal   Abdominal   Peds  Hematology   Anesthesia Other Findings Obese.gout.  Reproductive/Obstetrics                             Anesthesia Physical Anesthesia Plan  ASA: III  Anesthesia Plan: General   Post-op Pain Management:    Induction: Intravenous  PONV Risk Score and Plan:   Airway Management Planned:   Additional Equipment:   Intra-op Plan:   Post-operative Plan:   Informed Consent: I have reviewed the patients History and Physical, chart, labs and discussed the procedure including the risks, benefits and alternatives for the proposed anesthesia with the patient or authorized representative who has indicated his/her understanding and acceptance.     Plan Discussed with: CRNA  Anesthesia Plan Comments:         Anesthesia Quick Evaluation

## 2018-03-28 LAB — SURGICAL PATHOLOGY

## 2018-03-30 ENCOUNTER — Encounter: Payer: Self-pay | Admitting: Gastroenterology

## 2018-04-01 DIAGNOSIS — H2513 Age-related nuclear cataract, bilateral: Secondary | ICD-10-CM | POA: Diagnosis not present

## 2018-04-04 ENCOUNTER — Ambulatory Visit: Payer: Self-pay | Admitting: *Deleted

## 2018-04-04 NOTE — Telephone Encounter (Signed)
Pt called with having a b/p elevation this morning at 6 am. It was 234/122. It is elevated first thing in the morning per patient and he feels bad. He feels bad in the morning and "queasy weakness".  He finds that it has been up in the mornings since having his medications readjusted. He was having low b/p before the readjustments.  His b/p now at 9:35 is 168/100, HR 64.  No other symptoms.  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  Appointment scheduled per protocol. Care advice given.  Reason for Disposition . Systolic BP  >= 329 OR Diastolic >= 518  Answer Assessment - Initial Assessment Questions 1. BLOOD PRESSURE: "What is the blood pressure?" "Did you take at least two measurements 5 minutes apart?"     234/122  And hr 69 at 6 am this morning and now at 9:35 am 168/100 2. ONSET: "When did you take your blood pressure?"     6am and now 935 this morning 3. HOW: "How did you obtain the blood pressure?" (e.g., visiting nurse, automatic home BP monitor)     Automatic home BP monitor 4. HISTORY: "Do you have a history of high blood pressure?"     yes 5. MEDICATIONS: "Are you taking any medications for blood pressure?" "Have you missed any doses recently?"     no 6. OTHER SYMPTOMS: "Do you have any symptoms?" (e.g., headache, chest pain, blurred vision, difficulty breathing, weakness)     Feels bad in the morning but goes away, queasy weakness  Protocols used: HIGH BLOOD PRESSURE-A-AH

## 2018-04-04 NOTE — Telephone Encounter (Signed)
Patient scheduled for OV tomorrow. FYI.

## 2018-04-05 ENCOUNTER — Encounter: Payer: Self-pay | Admitting: Physician Assistant

## 2018-04-05 ENCOUNTER — Ambulatory Visit (INDEPENDENT_AMBULATORY_CARE_PROVIDER_SITE_OTHER): Payer: Medicare Other | Admitting: Physician Assistant

## 2018-04-05 ENCOUNTER — Other Ambulatory Visit: Payer: Self-pay

## 2018-04-05 VITALS — BP 175/95 | HR 74 | Temp 97.8°F | Ht 67.0 in | Wt 227.5 lb

## 2018-04-05 DIAGNOSIS — I10 Essential (primary) hypertension: Secondary | ICD-10-CM | POA: Diagnosis not present

## 2018-04-05 MED ORDER — TELMISARTAN 40 MG PO TABS: 60.0000 mg | ORAL_TABLET | Freq: Every day | ORAL | 0 refills | Status: DC

## 2018-04-05 NOTE — Progress Notes (Signed)
Subjective:    Patient ID: Darrell [name], male    DOB: Mar 27, 1946, 72 y.o.   MRN: [id number]  Darrell [name] is a 72 y.o. male presenting on 04/05/2018 for Hypertension (pt states BP high x 2days)   HPI  Patient presenting today for elevated blood pressure. He is currently on hydrochlorothiazide 25 mg, coreg 3.125 mg BID. Last visit his Micardis was decreased from 80 mg to 40 mg due to hypotension. Most recently has been having blood pressures 150's/80's. He denies headaches, chest pain, change in vision.   BP Readings from Last 3 Encounters:  04/05/18 (!) 175/95  03/25/18 119/61  02/16/18 (!) 93/56    Social History   Tobacco Use  . Smoking status: Former Smoker    Types: Cigarettes  . Smokeless tobacco: Never Used  . Tobacco comment: quit 30 years ago   Substance Use Topics  . Alcohol use: Yes    Alcohol/week: 1.0 standard drinks    Types: 1 Cans of beer per week    Comment: occasioanlly   . Drug use: No    Review of Systems Per HPI unless specifically indicated above     Objective:    BP (!) 175/95   Pulse 74   Temp 97.8 F (36.6 C) (Oral)   Ht 5\' 7"  (1.702 m)   Wt 227 lb 8 oz (103.2 kg)   SpO2 96%   BMI 35.63 kg/m   Wt Readings from Last 3 Encounters:  04/05/18 227 lb 8 oz (103.2 kg)  03/25/18 212 lb (96.2 kg)  02/16/18 227 lb (103 kg)    Physical Exam  Constitutional: He is oriented to person, place, and time. He appears well-developed and well-nourished.  Cardiovascular: Normal rate and regular rhythm.  Pulmonary/Chest: Effort normal and breath sounds normal.  Neurological: He is alert and oriented to person, place, and time.  Skin: Skin is warm and dry.  Psychiatric: He has a normal mood and affect. His behavior is normal.   Results for orders placed or performed during the hospital encounter of 03/25/18  Surgical pathology  Result Value Ref Range   SURGICAL PATHOLOGY      Surgical Pathology CASE: [id number] PATIENT: Sheral Flow Surgical Pathology Report     SPECIMEN SUBMITTED: A. Colon polyp x2, cecum;cold snare B. Colon polyp x2, transverse;cbx C. Colon polyp x2, sigmoid;cold snare  CLINICAL HISTORY: None provided  PRE-OPERATIVE DIAGNOSIS: Screening colonoscopy  POST-OPERATIVE DIAGNOSIS: Diverticulosis; Polyps;     DIAGNOSIS: A. COLON POLYP X2, CECUM; COLD SNARE: - TUBULAR ADENOMA (MULTIPLE FRAGMENTS). - NEGATIVE FOR HIGH-GRADE DYSPLASIA AND MALIGNANCY.  B.  COLON POLYP X2, TRANSVERSE; COLD BIOPSY: - TUBULAR ADENOMA (2). - NEGATIVE FOR HIGH-GRADE DYSPLASIA AND MALIGNANCY.  C.  COLON POLYP X2, SIGMOID; COLD SNARE: - TUBULAR ADENOMA. - HYPERPLASTIC POLYP. - NEGATIVE FOR HIGH-GRADE DYSPLASIA AND MALIGNANCY.   GROSS DESCRIPTION: A. Labeled: Cold snare/cbx cecum polyp x2 Received: In formalin Tissue fragment(s): 4 Size: 0.3-0.4 cm Description: Tan fragments Entirely submitted in one cassette.  BErmalene Postin d: Cold snare/cbx transverse colon polyp x2 Received: In formalin Tissue fragment(s): Multiple Size: 0.6 x 0.4 x 0.1 cm Description: Tan fragments and yellow fecal material Entirely submitted in one cassette.  C. Labeled: Cold snare sigmoid colon polyp x2 Received: In formalin Tissue fragment(s): Multiple Size: Aggregate, 1.5 x 1.1 x 0.1 cm Description: Tan fragments and yellow fecal material Entirely submitted in one cassette.   Final Diagnosis performed by Quay Burow, MD.   Electronically signed 03/28/2018 3:59:57PM  The electronic signature indicates that the named Attending Pathologist has evaluated the specimen  Technical component performed at [facility], [address], [place], [place] [address] Lab: [phone] Dir: Rush Farmer, MD, MMM  Professional component performed at [facility], [facility], [place], [place], [place] [address] Lab: [phone] Dir: Dellia Nims. Reuel Derby, MD       Assessment & Plan:  1. Essential  hypertension  Will increase to 60 mg daily along with other BP medication for recent elevated blood pressure.   - telmisartan (MICARDIS) 40 MG tablet; Take 1.5 tablets (60 mg total) by mouth daily.  Dispense: 135 tablet; Refill: 0    Follow up plan: Return in about 2 weeks (around 04/19/2018) for HTN.  Carles Collet, PA-C [facility] 04/05/2018, 8:15 AM

## 2018-04-07 DIAGNOSIS — G4733 Obstructive sleep apnea (adult) (pediatric): Secondary | ICD-10-CM | POA: Diagnosis not present

## 2018-04-07 DIAGNOSIS — E782 Mixed hyperlipidemia: Secondary | ICD-10-CM | POA: Diagnosis not present

## 2018-04-07 DIAGNOSIS — I25118 Atherosclerotic heart disease of native coronary artery with other forms of angina pectoris: Secondary | ICD-10-CM | POA: Diagnosis not present

## 2018-04-07 DIAGNOSIS — I255 Ischemic cardiomyopathy: Secondary | ICD-10-CM | POA: Diagnosis not present

## 2018-04-07 DIAGNOSIS — I10 Essential (primary) hypertension: Secondary | ICD-10-CM | POA: Diagnosis not present

## 2018-04-09 DIAGNOSIS — R079 Chest pain, unspecified: Secondary | ICD-10-CM | POA: Diagnosis not present

## 2018-04-10 ENCOUNTER — Emergency Department
Admission: EM | Admit: 2018-04-10 | Discharge: 2018-04-10 | Disposition: A | Discharge status: Home / Self Care | Payer: Medicare Other | Attending: Emergency Medicine | Admitting: Emergency Medicine

## 2018-04-10 ENCOUNTER — Emergency Department: Payer: Medicare Other

## 2018-04-10 DIAGNOSIS — I480 Paroxysmal atrial fibrillation: Secondary | ICD-10-CM | POA: Insufficient documentation

## 2018-04-10 DIAGNOSIS — Z79899 Other long term (current) drug therapy: Secondary | ICD-10-CM | POA: Diagnosis not present

## 2018-04-10 DIAGNOSIS — I2510 Atherosclerotic heart disease of native coronary artery without angina pectoris: Secondary | ICD-10-CM | POA: Insufficient documentation

## 2018-04-10 DIAGNOSIS — Z7902 Long term (current) use of antithrombotics/antiplatelets: Secondary | ICD-10-CM | POA: Diagnosis not present

## 2018-04-10 DIAGNOSIS — I160 Hypertensive urgency: Secondary | ICD-10-CM

## 2018-04-10 DIAGNOSIS — Z87891 Personal history of nicotine dependence: Secondary | ICD-10-CM | POA: Insufficient documentation

## 2018-04-10 DIAGNOSIS — Z7901 Long term (current) use of anticoagulants: Secondary | ICD-10-CM | POA: Diagnosis not present

## 2018-04-10 DIAGNOSIS — R079 Chest pain, unspecified: Secondary | ICD-10-CM

## 2018-04-10 LAB — BASIC METABOLIC PANEL
ANION GAP: 6 (ref 5–15)
BUN: 28 mg/dL — ABNORMAL HIGH (ref 8–23)
CO2: 26 mmol/L (ref 22–32)
Calcium: 8 mg/dL — ABNORMAL LOW (ref 8.9–10.3)
Chloride: 109 mmol/L (ref 98–111)
Creatinine, Ser: 1.38 mg/dL — ABNORMAL HIGH (ref 0.61–1.24)
GFR calc Af Amer: 58 mL/min — ABNORMAL LOW (ref >60)
GFR, EST NON AFRICAN AMERICAN: 50 mL/min — AB (ref >60)
Glucose, Bld: 131 mg/dL — ABNORMAL HIGH (ref 70–99)
POTASSIUM: 3.5 mmol/L (ref 3.5–5.1)
SODIUM: 141 mmol/L (ref 135–145)

## 2018-04-10 LAB — CBC
HCT: 37.6 % — ABNORMAL LOW (ref 40.0–52.0)
Hemoglobin: 13.4 g/dL (ref 13.0–18.0)
MCH: 33.5 pg (ref 26.0–34.0)
MCHC: 35.7 g/dL (ref 32.0–36.0)
MCV: 93.8 fL (ref 80.0–100.0)
PLATELETS: 127 10*3/uL — AB (ref 150–440)
RBC: 4.01 MIL/uL — ABNORMAL LOW (ref 4.40–5.90)
RDW: 13.5 % (ref 11.5–14.5)
WBC: 6 10*3/uL (ref 3.8–10.6)

## 2018-04-10 LAB — TROPONIN I: Troponin I: 0.04 ng/mL (ref <0.03)

## 2018-04-10 NOTE — ED Triage Notes (Signed)
Pt here via ems  Report of chest pain  Discomfort  , for some time now . Pt with hx of 4 stents / 3 caths   htn  2/10 pain at this time .

## 2018-04-10 NOTE — ED Provider Notes (Addendum)
[facility] Emergency Department Provider Note   ____________________________________________   First MD Initiated Contact with Patient 04/10/18 [phone]     (approximate)  I have reviewed the triage vital signs and the nursing notes.   HISTORY  Chief Complaint Chest Pain    HPI Darrell [name] is a 72 y.o. male who comes into the hospital today with some chest pain.  The patient states that he is taking his blood pressure 1:11 PM and it was 226 over the 100s.  He waited for a little while and went to the bathroom.  He states that he developed nausea and chest pressure that radiated into his neck.  This is the highest blood pressure he has ever had.  The patient states that when EMS arrived it was in the 190s.  The patient states that he has been taking his medications for his blood pressure.  The patient denies any sweats dizziness or lightheadedness.  He also did not have any vomiting.  He states that he had taken a nitroglycerin around 9 PM because he did have some pain at that time as well.  He states that he was walking to his car when the pain was getting worse.  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  The patient is here today for evaluation of the symptoms.  The patient states that his pain is improved at this time.   Past Medical History:  Diagnosis Date  . CAD (coronary artery disease)   . Cancer [facility])    prostate  . Carotid artery stenosis   . Hyperlipidemia   . Hypertension   . Psoriasis     Patient Active Problem List   Diagnosis Date Noted  . Mixed hyperlipidemia 12/20/2017  . Advanced care planning/counseling discussion 12/20/2017  . Coronary artery disease 08/26/2017  . Psoriatic arthritis ([place]) 07/23/2017  . OSA (obstructive sleep apnea) 05/27/2017  . Gout 12/17/2016  . Prediabetes 12/17/2016  . Carotid stenosis 06/12/2016  . Psoriasis 05/09/2015  . CAD (coronary artery disease) 05/09/2015  . Sleep apnea 05/09/2015  .  Hypertension   . Hyperlipidemia   . Benign essential hypertension 03/13/2015  . Paroxysmal A-fib ([place]) 07/31/2014  . Cardiomyopathy, ischemic 07/31/2014    Past Surgical History:  Procedure Laterality Date  . CHOLECYSTECTOMY    . COLONOSCOPY    . COLONOSCOPY WITH PROPOFOL N/A 03/25/2018   Procedure: COLONOSCOPY WITH PROPOFOL;  Surgeon: Jonathon Bellows, MD;  Location: [facility] ENDOSCOPY;  Service: Gastroenterology;  Laterality: N/A;  . CORONARY ANGIOPLASTY WITH STENT PLACEMENT    . LEFT HEART CATH AND CORONARY ANGIOGRAPHY N/A 05/21/2017   Procedure: LEFT HEART CATH AND CORONARY ANGIOGRAPHY;  Surgeon: Teodoro Spray, MD;  Location: [facility] CV LAB;  Service: Cardiovascular;  Laterality: N/A;  . LIVER BIOPSY  1999  . SHOULDER SURGERY    . stents      Prior to Admission medications   Medication Sig Start Date End Date Taking? Authorizing Provider  allopurinol (ZYLOPRIM) 300 MG tablet Take 0.5 tablets (150 mg total) by mouth daily. 02/16/18   Guadalupe Maple, MD  apixaban (ELIQUIS) 5 MG TABS tablet Take by mouth. 12/07/17   [provider]  atorvastatin (LIPITOR) 80 MG tablet Take 80 mg by mouth daily.    [provider]  carvedilol (COREG) 3.125 MG tablet  12/08/17   [provider]  clopidogrel (PLAVIX) 75 MG tablet Take 75 mg by mouth daily.    [provider]  hydrochlorothiazide (HYDRODIURIL) 25 MG tablet Take 1 tablet (25 mg total) by mouth daily. 02/16/18   Guadalupe Maple, MD  isosorbide mononitrate (IMDUR) 30 MG 24 hr tablet Take by mouth. 12/09/17 12/09/18  [provider]  nitroGLYCERIN (NITROSTAT) 0.4 MG SL tablet Place 1 tablet (0.4 mg total) under the tongue every 5 (five) minutes as needed for chest pain. 01/24/18   Guadalupe Maple, MD  telmisartan (MICARDIS) 40 MG tablet Take 1.5 tablets (60 mg total) by mouth daily. 04/05/18 07/04/18  Trinna Post, PA-C    Allergies Patient has no known allergies.  Family History  Problem  Relation Age of Onset  . Heart attack Mother   . Diabetes Mother   . Stroke Mother   . Heart disease Mother   . Diabetes Brother   . Heart Problems Brother   . Diabetes Sister   . Lung cancer Maternal Aunt   . Esophageal cancer Sister     Social History Social History   Tobacco Use  . Smoking status: Former Smoker    Types: Cigarettes  . Smokeless tobacco: Never Used  . Tobacco comment: quit 30 years ago   Substance Use Topics  . Alcohol use: Yes    Alcohol/week: 1.0 standard drinks    Types: 1 Cans of beer per week    Comment: occasioanlly   . Drug use: No    Review of Systems  Constitutional: No fever/chills Eyes: No visual changes. ENT: No sore throat. Cardiovascular:  chest pain. Respiratory:  shortness of breath. Gastrointestinal: Nausea with no abdominal pain. no vomiting.  No diarrhea.  No constipation. Genitourinary: Negative for dysuria. Musculoskeletal: Negative for back pain. Skin: Negative for rash. Neurological: Negative for headaches   ____________________________________________   PHYSICAL EXAM:  VITAL SIGNS: ED Triage Vitals  Enc Vitals Group     BP 04/10/18 0200 (!) 156/69     Pulse Rate 04/10/18 0149 65     Resp 04/10/18 0149 19     Temp 04/10/18 0149 98.1 F (36.7 C)     Temp Source 04/10/18 0149 Oral     SpO2 04/10/18 0148 96 %     Weight 04/10/18 0151 222 lb (100.7 kg)     Height 04/10/18 0151 5\' 9"  (1.753 m)     Head Circumference --      Peak Flow --      Pain Score 04/10/18 0150 2     Pain Loc --      Pain Edu? --      Excl. in [place]? --     Constitutional: Alert and oriented. Well appearing and in mild distress. Eyes: Conjunctivae are normal. PERRL. EOMI. Head: Atraumatic. Nose: No congestion/rhinnorhea. Mouth/Throat: Mucous membranes are moist.  Oropharynx non-erythematous. Cardiovascular: Normal rate, regular rhythm. Grossly normal heart sounds.  Good peripheral circulation. Respiratory: Normal respiratory effort.  No  retractions. Lungs CTAB. Gastrointestinal: Soft and nontender. No distention.  Positive bowel sounds Musculoskeletal: No lower extremity tenderness nor edema.   Neurologic:  Normal speech and language.  Skin:  Skin is warm, dry and intact.  Psychiatric: Mood and affect are normal.   ____________________________________________   LABS (all labs ordered are listed, but only abnormal results are displayed)  Labs Reviewed  CBC - Abnormal; Notable for the following components:      Result Value   RBC 4.01 (*)    HCT 37.6 (*)    Platelets 127 (*)    All other components within normal limits  BASIC METABOLIC PANEL - Abnormal; Notable for the following components:   Glucose, Bld 131 (*)    BUN 28 (*)    Creatinine, Ser 1.38 (*)    Calcium 8.0 (*)    GFR calc non Af Amer 50 (*)    GFR calc Af Amer 58 (*)    All other components within normal limits  TROPONIN I - Abnormal; Notable for the following components:   Troponin I 0.04 (*)    All other components within normal limits  TROPONIN I   ____________________________________________  EKG  ED ECG REPORT I, Loney Hering, the attending physician, personally viewed and interpreted this ECG.   Date: 04/10/2018  EKG Time: 141  Rate: 62  Rhythm: Sinus arrhythmia,  Axis: Normal  Intervals:none  ST&T Change: Flipped T waves in leads III, aVF, V5, V6 seen on EKG from April 2019  ____________________________________________  RADIOLOGY  ED MD interpretation: Chest x-ray: No acute pulmonary process identified, aortic atherosclerosis  Official radiology report(s): Dg Chest Portable 1 View  Result Date: 04/10/2018 CLINICAL DATA:  72 y/o  M; chest pain and discomfort. EXAM: PORTABLE CHEST 1 VIEW COMPARISON:  12/06/2017 chest radiograph FINDINGS: Stable cardiac silhouette within normal limits given projection and technique. Aortic atherosclerosis with calcification. Clear lungs. No pleural effusion or pneumothorax. No acute  osseous abnormality is evident. IMPRESSION: No acute pulmonary process identified.  Aortic atherosclerosis. Electronically Signed   By: Kristine Garbe M.D.   On: 04/10/2018 02:49    ____________________________________________   PROCEDURES  Procedure(s) performed: None  Procedures  Critical Care performed: No  ____________________________________________   INITIAL IMPRESSION / ASSESSMENT AND PLAN / ED COURSE  As part of my medical decision making, I reviewed the following data within the electronic MEDICAL RECORD NUMBER Notes from prior ED visits and [place] Controlled Substance Database   This is a 72 year old male who comes into the hospital today with some chest pain and elevated blood pressure.  The patient states that he has been taking his medications without any difficulties.  He reports though that he was concerned that his blood pressure was so high so he decided to come in and get checked out.  The patient is not having any pain at this time.  We did check a CBC, BMP and a troponin on the patient.  The patient's blood work is unremarkable.  His creatinine is elevated but it appears to be his baseline.  We will repeat the patient's troponin and then reassess if he continues to have any pain or if he continues to be pain-free.  The patient received a repeat troponin which was 0.04.  Looking back at the patient's notes he has had troponins that is hovered between 0.03 and 0.04.  The patient states that he feels well and he is ready to be discharged home.  The patient should follow-up with his cardiologist.  The patient states that he has a catheterization scheduled for this week to take a look around his heart.  The patient states that he feels well enough to be discharged home.      ____________________________________________   FINAL CLINICAL IMPRESSION(S) / ED DIAGNOSES  Final diagnoses:  Chest pain, unspecified type  Hypertensive urgency     ED Discharge Orders      None       Note:  This document was prepared using Dragon voice recognition software and may include unintentional dictation errors.    Loney Hering, MD 04/10/18 0730  Loney Hering, MD 04/10/18 [phone]

## 2018-04-10 NOTE — ED Notes (Signed)
Pt assisted to restroom, pt able to ambulate without assistance.

## 2018-04-10 NOTE — Discharge Instructions (Addendum)
Please follow up with your cardiologist prior to your catheterization

## 2018-04-10 NOTE — ED Notes (Signed)
Pt able to ambulate to rest room at this time , pt observed to have a steady gate , without any acute distress /SOB .

## 2018-04-11 DIAGNOSIS — I25118 Atherosclerotic heart disease of native coronary artery with other forms of angina pectoris: Secondary | ICD-10-CM | POA: Diagnosis not present

## 2018-04-14 DIAGNOSIS — I10 Essential (primary) hypertension: Secondary | ICD-10-CM | POA: Diagnosis not present

## 2018-04-14 DIAGNOSIS — I480 Paroxysmal atrial fibrillation: Secondary | ICD-10-CM | POA: Diagnosis not present

## 2018-04-14 DIAGNOSIS — Z7901 Long term (current) use of anticoagulants: Secondary | ICD-10-CM | POA: Diagnosis not present

## 2018-04-14 DIAGNOSIS — I2582 Chronic total occlusion of coronary artery: Secondary | ICD-10-CM | POA: Diagnosis not present

## 2018-04-14 DIAGNOSIS — Z79899 Other long term (current) drug therapy: Secondary | ICD-10-CM | POA: Diagnosis not present

## 2018-04-14 DIAGNOSIS — E782 Mixed hyperlipidemia: Secondary | ICD-10-CM | POA: Diagnosis not present

## 2018-04-14 DIAGNOSIS — Z87891 Personal history of nicotine dependence: Secondary | ICD-10-CM | POA: Diagnosis not present

## 2018-04-14 DIAGNOSIS — I25118 Atherosclerotic heart disease of native coronary artery with other forms of angina pectoris: Secondary | ICD-10-CM | POA: Diagnosis not present

## 2018-04-14 DIAGNOSIS — I255 Ischemic cardiomyopathy: Secondary | ICD-10-CM | POA: Diagnosis not present

## 2018-04-14 DIAGNOSIS — R072 Precordial pain: Secondary | ICD-10-CM | POA: Diagnosis not present

## 2018-04-14 DIAGNOSIS — G4733 Obstructive sleep apnea (adult) (pediatric): Secondary | ICD-10-CM | POA: Diagnosis not present

## 2018-04-19 ENCOUNTER — Ambulatory Visit: Payer: Medicare Other | Admitting: Physician Assistant

## 2018-04-19 DIAGNOSIS — I208 Other forms of angina pectoris: Secondary | ICD-10-CM | POA: Diagnosis not present

## 2018-04-19 DIAGNOSIS — I517 Cardiomegaly: Secondary | ICD-10-CM | POA: Diagnosis not present

## 2018-04-19 DIAGNOSIS — I240 Acute coronary thrombosis not resulting in myocardial infarction: Secondary | ICD-10-CM | POA: Diagnosis not present

## 2018-04-19 DIAGNOSIS — D696 Thrombocytopenia, unspecified: Secondary | ICD-10-CM | POA: Diagnosis present

## 2018-04-19 DIAGNOSIS — Z7901 Long term (current) use of anticoagulants: Secondary | ICD-10-CM | POA: Diagnosis not present

## 2018-04-19 DIAGNOSIS — E782 Mixed hyperlipidemia: Secondary | ICD-10-CM | POA: Diagnosis present

## 2018-04-19 DIAGNOSIS — E861 Hypovolemia: Secondary | ICD-10-CM | POA: Diagnosis not present

## 2018-04-19 DIAGNOSIS — D62 Acute posthemorrhagic anemia: Secondary | ICD-10-CM | POA: Diagnosis not present

## 2018-04-19 DIAGNOSIS — G4733 Obstructive sleep apnea (adult) (pediatric): Secondary | ICD-10-CM | POA: Diagnosis present

## 2018-04-19 DIAGNOSIS — I2510 Atherosclerotic heart disease of native coronary artery without angina pectoris: Secondary | ICD-10-CM | POA: Diagnosis present

## 2018-04-19 DIAGNOSIS — Z7902 Long term (current) use of antithrombotics/antiplatelets: Secondary | ICD-10-CM | POA: Diagnosis not present

## 2018-04-19 DIAGNOSIS — I440 Atrioventricular block, first degree: Secondary | ICD-10-CM | POA: Diagnosis not present

## 2018-04-19 DIAGNOSIS — I25118 Atherosclerotic heart disease of native coronary artery with other forms of angina pectoris: Secondary | ICD-10-CM | POA: Diagnosis not present

## 2018-04-19 DIAGNOSIS — Z955 Presence of coronary angioplasty implant and graft: Secondary | ICD-10-CM | POA: Diagnosis not present

## 2018-04-19 DIAGNOSIS — I10 Essential (primary) hypertension: Secondary | ICD-10-CM | POA: Diagnosis present

## 2018-04-19 DIAGNOSIS — Z7982 Long term (current) use of aspirin: Secondary | ICD-10-CM | POA: Diagnosis not present

## 2018-04-19 DIAGNOSIS — J9811 Atelectasis: Secondary | ICD-10-CM | POA: Diagnosis not present

## 2018-04-19 DIAGNOSIS — Z951 Presence of aortocoronary bypass graft: Secondary | ICD-10-CM | POA: Diagnosis not present

## 2018-04-19 DIAGNOSIS — J811 Chronic pulmonary edema: Secondary | ICD-10-CM | POA: Diagnosis not present

## 2018-04-19 DIAGNOSIS — R001 Bradycardia, unspecified: Secondary | ICD-10-CM | POA: Diagnosis not present

## 2018-04-19 DIAGNOSIS — N179 Acute kidney failure, unspecified: Secondary | ICD-10-CM | POA: Diagnosis not present

## 2018-04-19 DIAGNOSIS — R918 Other nonspecific abnormal finding of lung field: Secondary | ICD-10-CM | POA: Diagnosis not present

## 2018-04-19 DIAGNOSIS — J90 Pleural effusion, not elsewhere classified: Secondary | ICD-10-CM | POA: Diagnosis not present

## 2018-04-19 DIAGNOSIS — I4891 Unspecified atrial fibrillation: Secondary | ICD-10-CM | POA: Diagnosis not present

## 2018-04-19 DIAGNOSIS — Z8546 Personal history of malignant neoplasm of prostate: Secondary | ICD-10-CM | POA: Diagnosis not present

## 2018-04-19 DIAGNOSIS — I480 Paroxysmal atrial fibrillation: Secondary | ICD-10-CM | POA: Diagnosis present

## 2018-04-19 DIAGNOSIS — N320 Bladder-neck obstruction: Secondary | ICD-10-CM | POA: Diagnosis present

## 2018-04-19 DIAGNOSIS — N35919 Unspecified urethral stricture, male, unspecified site: Secondary | ICD-10-CM | POA: Diagnosis present

## 2018-04-19 DIAGNOSIS — I951 Orthostatic hypotension: Secondary | ICD-10-CM | POA: Diagnosis not present

## 2018-04-19 DIAGNOSIS — D72829 Elevated white blood cell count, unspecified: Secondary | ICD-10-CM | POA: Diagnosis not present

## 2018-04-20 DIAGNOSIS — Z7901 Long term (current) use of anticoagulants: Secondary | ICD-10-CM | POA: Insufficient documentation

## 2018-04-21 DIAGNOSIS — Z9889 Other specified postprocedural states: Secondary | ICD-10-CM | POA: Insufficient documentation

## 2018-04-21 DIAGNOSIS — N320 Bladder-neck obstruction: Secondary | ICD-10-CM | POA: Insufficient documentation

## 2018-04-21 DIAGNOSIS — Z951 Presence of aortocoronary bypass graft: Secondary | ICD-10-CM | POA: Insufficient documentation

## 2018-04-21 DIAGNOSIS — Z8546 Personal history of malignant neoplasm of prostate: Secondary | ICD-10-CM | POA: Insufficient documentation

## 2018-04-21 DIAGNOSIS — N35919 Unspecified urethral stricture, male, unspecified site: Secondary | ICD-10-CM | POA: Insufficient documentation

## 2018-04-21 HISTORY — PX: CORONARY ARTERY BYPASS GRAFT: SHX141

## 2018-04-23 DIAGNOSIS — I951 Orthostatic hypotension: Secondary | ICD-10-CM | POA: Insufficient documentation

## 2018-04-23 DIAGNOSIS — D62 Acute posthemorrhagic anemia: Secondary | ICD-10-CM | POA: Insufficient documentation

## 2018-04-25 DIAGNOSIS — I440 Atrioventricular block, first degree: Secondary | ICD-10-CM | POA: Insufficient documentation

## 2018-04-25 DIAGNOSIS — R531 Weakness: Secondary | ICD-10-CM | POA: Insufficient documentation

## 2018-05-06 ENCOUNTER — Ambulatory Visit: Payer: Medicare Other

## 2018-05-10 ENCOUNTER — Telehealth: Payer: Self-pay | Admitting: Family Medicine

## 2018-05-10 NOTE — Telephone Encounter (Signed)
Copied from [place] [phone]. Topic: General - Other >> May 10, 2018  2:25 PM Lennox Solders wrote: Reason for CRM: pt has called pharm. Pt needs refill on eliquis 5 mg. Pt has one pill left. [address] [place]

## 2018-05-10 NOTE — Telephone Encounter (Signed)
Contacted pt regarding refill for apixaban; chart show that prescription was written on 12/07/17 with source of [facility]; pt states that does not have the old bottle in front of him; he will contact his cardiologist regarding this refill; also spoke with [place] at [facility], and she verifies that this medication is per Dr [name] (cardiology); will also route to office for notification of this encounter.

## 2018-05-11 ENCOUNTER — Inpatient Hospital Stay: Payer: Medicare Other

## 2018-05-11 DIAGNOSIS — R9431 Abnormal electrocardiogram [ECG] [EKG]: Secondary | ICD-10-CM | POA: Diagnosis not present

## 2018-05-11 DIAGNOSIS — J90 Pleural effusion, not elsewhere classified: Secondary | ICD-10-CM | POA: Diagnosis not present

## 2018-05-11 DIAGNOSIS — I440 Atrioventricular block, first degree: Secondary | ICD-10-CM | POA: Diagnosis not present

## 2018-05-11 DIAGNOSIS — J9811 Atelectasis: Secondary | ICD-10-CM | POA: Diagnosis not present

## 2018-05-11 DIAGNOSIS — Z951 Presence of aortocoronary bypass graft: Secondary | ICD-10-CM | POA: Diagnosis not present

## 2018-05-11 DIAGNOSIS — Z48812 Encounter for surgical aftercare following surgery on the circulatory system: Secondary | ICD-10-CM | POA: Diagnosis not present

## 2018-05-16 ENCOUNTER — Other Ambulatory Visit: Payer: Self-pay | Admitting: Oncology

## 2018-05-17 DIAGNOSIS — I480 Paroxysmal atrial fibrillation: Secondary | ICD-10-CM | POA: Diagnosis not present

## 2018-05-17 DIAGNOSIS — I25810 Atherosclerosis of coronary artery bypass graft(s) without angina pectoris: Secondary | ICD-10-CM | POA: Diagnosis not present

## 2018-05-17 DIAGNOSIS — I10 Essential (primary) hypertension: Secondary | ICD-10-CM | POA: Diagnosis not present

## 2018-05-17 DIAGNOSIS — E782 Mixed hyperlipidemia: Secondary | ICD-10-CM | POA: Diagnosis not present

## 2018-05-18 ENCOUNTER — Inpatient Hospital Stay: Payer: Medicare Other | Attending: Oncology

## 2018-05-18 VITALS — BP 120/75 | HR 80 | Temp 97.0°F | Resp 18 | Wt 213.5 lb

## 2018-05-18 DIAGNOSIS — Z5111 Encounter for antineoplastic chemotherapy: Secondary | ICD-10-CM | POA: Diagnosis not present

## 2018-05-18 DIAGNOSIS — L4050 Arthropathic psoriasis, unspecified: Secondary | ICD-10-CM | POA: Diagnosis not present

## 2018-05-18 DIAGNOSIS — L400 Psoriasis vulgaris: Secondary | ICD-10-CM | POA: Insufficient documentation

## 2018-05-18 MED ORDER — TILDRAKIZUMAB-ASMN 100 MG/ML ~~LOC~~ SOSY
100.0000 mg | PREFILLED_SYRINGE | Freq: Once | SUBCUTANEOUS | Status: AC
  Administered 2018-05-18: 100 mg via SUBCUTANEOUS
  Filled 2018-05-18 (×2): qty 1

## 2018-05-30 DIAGNOSIS — C61 Malignant neoplasm of prostate: Secondary | ICD-10-CM | POA: Diagnosis not present

## 2018-05-30 DIAGNOSIS — D400 Neoplasm of uncertain behavior of prostate: Secondary | ICD-10-CM | POA: Diagnosis not present

## 2018-05-30 DIAGNOSIS — N5201 Erectile dysfunction due to arterial insufficiency: Secondary | ICD-10-CM | POA: Diagnosis not present

## 2018-06-04 DIAGNOSIS — Z23 Encounter for immunization: Secondary | ICD-10-CM | POA: Diagnosis not present

## 2018-06-06 ENCOUNTER — Encounter: Payer: Medicare Other | Attending: Internal Medicine | Admitting: *Deleted

## 2018-06-06 ENCOUNTER — Encounter: Payer: Self-pay | Admitting: *Deleted

## 2018-06-06 DIAGNOSIS — Z8546 Personal history of malignant neoplasm of prostate: Secondary | ICD-10-CM | POA: Insufficient documentation

## 2018-06-06 DIAGNOSIS — Z79899 Other long term (current) drug therapy: Secondary | ICD-10-CM | POA: Diagnosis not present

## 2018-06-06 DIAGNOSIS — E785 Hyperlipidemia, unspecified: Secondary | ICD-10-CM | POA: Insufficient documentation

## 2018-06-06 DIAGNOSIS — Z7901 Long term (current) use of anticoagulants: Secondary | ICD-10-CM | POA: Diagnosis not present

## 2018-06-06 DIAGNOSIS — I2510 Atherosclerotic heart disease of native coronary artery without angina pectoris: Secondary | ICD-10-CM | POA: Diagnosis not present

## 2018-06-06 DIAGNOSIS — Z7982 Long term (current) use of aspirin: Secondary | ICD-10-CM | POA: Diagnosis not present

## 2018-06-06 DIAGNOSIS — L409 Psoriasis, unspecified: Secondary | ICD-10-CM | POA: Insufficient documentation

## 2018-06-06 DIAGNOSIS — Z951 Presence of aortocoronary bypass graft: Secondary | ICD-10-CM | POA: Insufficient documentation

## 2018-06-06 DIAGNOSIS — I629 Nontraumatic intracranial hemorrhage, unspecified: Secondary | ICD-10-CM | POA: Diagnosis not present

## 2018-06-06 DIAGNOSIS — Z7902 Long term (current) use of antithrombotics/antiplatelets: Secondary | ICD-10-CM | POA: Insufficient documentation

## 2018-06-06 DIAGNOSIS — Z87891 Personal history of nicotine dependence: Secondary | ICD-10-CM | POA: Insufficient documentation

## 2018-06-06 DIAGNOSIS — I10 Essential (primary) hypertension: Secondary | ICD-10-CM | POA: Diagnosis not present

## 2018-06-06 NOTE — Progress Notes (Signed)
Cardiac Individual Treatment Plan  Patient Details  Name: Darrell [name] MRN: [id number] Date of Birth: 20-Mar-1946 Referring Provider:     Cardiac Rehab from 06/06/2018 in [facility] Cardiac and Pulmonary Rehab  Referring Provider  Serafina Royals MD      Initial Encounter Date:    Cardiac Rehab from 06/06/2018 in [facility] Cardiac and Pulmonary Rehab  Date  06/06/18      Visit Diagnosis: S/P CABG x 2  Patient's Home Medications on Admission:  Current Outpatient Medications:  .  allopurinol (ZYLOPRIM) 300 MG tablet, Take 0.5 tablets (150 mg total) by mouth daily., Disp: 90 tablet, Rfl: 4 .  apixaban (ELIQUIS) 5 MG TABS tablet, Take by mouth., Disp: , Rfl:  .  aspirin EC 81 MG tablet, Take by mouth., Disp: , Rfl:  .  atorvastatin (LIPITOR) 80 MG tablet, Take 80 mg by mouth daily., Disp: , Rfl:  .  carvedilol (COREG) 6.25 MG tablet, Take by mouth., Disp: , Rfl:  .  telmisartan (MICARDIS) 80 MG tablet, Take by mouth., Disp: , Rfl:  .  clopidogrel (PLAVIX) 75 MG tablet, Take 75 mg by mouth daily., Disp: , Rfl:  .  hydrochlorothiazide (HYDRODIURIL) 25 MG tablet, Take 1 tablet (25 mg total) by mouth daily. (Patient not taking: Reported on 06/06/2018), Disp: 90 tablet, Rfl: 4 .  isosorbide mononitrate (IMDUR) 30 MG 24 hr tablet, Take by mouth., Disp: , Rfl:  .  nitroGLYCERIN (NITROSTAT) 0.4 MG SL tablet, Place 1 tablet (0.4 mg total) under the tongue every 5 (five) minutes as needed for chest pain. (Patient not taking: Reported on 06/06/2018), Disp: 25 tablet, Rfl: 1  Past Medical History: Past Medical History:  Diagnosis Date  . CAD (coronary artery disease)   . Cancer [facility])    prostate  . Carotid artery stenosis   . Hyperlipidemia   . Hypertension   . Psoriasis     Tobacco Use: Social History   Tobacco Use  Smoking Status Former Smoker  . Types: Cigarettes  Smokeless Tobacco Never Used  Tobacco Comment   quit 30 years ago     Labs: Recent Review Flowsheet Data    Labs for  ITP Cardiac and Pulmonary Rehab Latest Ref Rng & Units 11/18/2016 12/17/2016 05/20/2017 06/09/2017 12/20/2017   Cholestrol 100 - 199 mg/dL 112 104 - 106 134   LDLCALC 0 - 99 mg/dL - 39 - 42 44   HDL >39 mg/dL - 41 - 41 39(L)   Trlycerides 0 - 149 mg/dL 264(H) 122 - 114 257(H)   Hemoglobin A1c 4.8 - 5.6 % - - 5.6 - -       Exercise Target Goals: Exercise Program Goal: Individual exercise prescription set using results from initial 6 min walk test and THRR while considering  patient's activity barriers and safety.   Exercise Prescription Goal: Initial exercise prescription builds to 30-45 minutes a day of aerobic activity, 2-3 days per week.  Home exercise guidelines will be given to patient during program as part of exercise prescription that the participant will acknowledge.  Activity Barriers & Risk Stratification: Activity Barriers & Cardiac Risk Stratification - 06/06/18 1252      Activity Barriers & Cardiac Risk Stratification   Activity Barriers  Deconditioning;Muscular Weakness    Cardiac Risk Stratification  High       6 Minute Walk: 6 Minute Walk    Row Name 06/06/18 1348         6 Minute Walk   Phase  Initial  Distance  1520 feet     Walk Time  6 minutes     # of Rest Breaks  0     MPH  2.88     METS  2.91     RPE  11     VO2 Peak  10.18     Symptoms  No     Resting HR  74 bpm     Resting BP  108/64     Resting Oxygen Saturation   97 %     Exercise Oxygen Saturation  during 6 min walk  98 %     Max Ex. HR  115 bpm     Max Ex. BP  174/76     2 Minute Post BP  126/62        Oxygen Initial Assessment:   Oxygen Re-Evaluation:   Oxygen Discharge (Final Oxygen Re-Evaluation):   Initial Exercise Prescription: Initial Exercise Prescription - 06/06/18 1300      Date of Initial Exercise RX and Referring Provider   Date  06/06/18    Referring Provider  Serafina Royals MD      Treadmill   MPH  2.3    Grade  0.5    Minutes  15    METs  2.93       Elliptical   Level  1    Speed  3    Minutes  15      T5 Nustep   Level  3    SPM  80    Minutes  15    METs  3      Prescription Details   Frequency (times per week)  2    Duration  Progress to 30 minutes of continuous aerobic without signs/symptoms of physical distress      Intensity   THRR 40-80% of Max Heartrate  104-135    Ratings of Perceived Exertion  11-13    Perceived Dyspnea  0-4      Progression   Progression  Continue to progress workloads to maintain intensity without signs/symptoms of physical distress.      Resistance Training   Training Prescription  Yes    Weight  4 lbs    Reps  10-15       Perform Capillary Blood Glucose checks as needed.  Exercise Prescription Changes: Exercise Prescription Changes    Row Name 06/06/18 1200             Response to Exercise   Blood Pressure (Admit)  108/64       Blood Pressure (Exercise)  174/76       Blood Pressure (Exit)  126/62       Heart Rate (Admit)  74 bpm       Heart Rate (Exercise)  115 bpm       Heart Rate (Exit)  85 bpm       Oxygen Saturation (Admit)  97 %       Oxygen Saturation (Exercise)  98 %       Rating of Perceived Exertion (Exercise)  11       Symptoms  none       Comments  walk test results          Exercise Comments:   Exercise Goals and Review: Exercise Goals    Row Name 06/06/18 1351             Exercise Goals   Increase Physical Activity  Yes  Intervention  Provide advice, education, support and counseling about physical activity/exercise needs.;Develop an individualized exercise prescription for aerobic and resistive training based on initial evaluation findings, risk stratification, comorbidities and participant's personal goals.       Expected Outcomes  Short Term: Attend rehab on a regular basis to increase amount of physical activity.;Long Term: Add in home exercise to make exercise part of routine and to increase amount of physical activity.;Long Term:  Exercising regularly at least 3-5 days a week.       Increase Strength and Stamina  Yes       Intervention  Provide advice, education, support and counseling about physical activity/exercise needs.;Develop an individualized exercise prescription for aerobic and resistive training based on initial evaluation findings, risk stratification, comorbidities and participant's personal goals.       Expected Outcomes  Short Term: Perform resistance training exercises routinely during rehab and add in resistance training at home;Long Term: Improve cardiorespiratory fitness, muscular endurance and strength as measured by increased METs and functional capacity (6MWT);Short Term: Increase workloads from initial exercise prescription for resistance, speed, and METs.       Able to understand and use rate of perceived exertion (RPE) scale  Yes       Intervention  Provide education and explanation on how to use RPE scale       Expected Outcomes  Short Term: Able to use RPE daily in rehab to express subjective intensity level;Long Term:  Able to use RPE to guide intensity level when exercising independently       Knowledge and understanding of Target Heart Rate Range (THRR)  Yes       Intervention  Provide education and explanation of THRR including how the numbers were predicted and where they are located for reference       Expected Outcomes  Short Term: Able to state/look up THRR;Long Term: Able to use THRR to govern intensity when exercising independently;Short Term: Able to use daily as guideline for intensity in rehab       Able to check pulse independently  Yes       Intervention  Provide education and demonstration on how to check pulse in carotid and radial arteries.;Review the importance of being able to check your own pulse for safety during independent exercise       Expected Outcomes  Short Term: Able to explain why pulse checking is important during independent exercise;Long Term: Able to check pulse  independently and accurately       Understanding of Exercise Prescription  Yes       Intervention  Provide education, explanation, and written materials on patient's individual exercise prescription       Expected Outcomes  Short Term: Able to explain program exercise prescription;Long Term: Able to explain home exercise prescription to exercise independently          Exercise Goals Re-Evaluation :   Discharge Exercise Prescription (Final Exercise Prescription Changes): Exercise Prescription Changes - 06/06/18 1200      Response to Exercise   Blood Pressure (Admit)  108/64    Blood Pressure (Exercise)  174/76    Blood Pressure (Exit)  126/62    Heart Rate (Admit)  74 bpm    Heart Rate (Exercise)  115 bpm    Heart Rate (Exit)  85 bpm    Oxygen Saturation (Admit)  97 %    Oxygen Saturation (Exercise)  98 %    Rating of Perceived Exertion (Exercise)  11  Symptoms  none    Comments  walk test results       Nutrition:  Target Goals: Understanding of nutrition guidelines, daily intake of sodium <1551m, cholesterol <2054m calories 30% from fat and 7% or less from saturated fats, daily to have 5 or more servings of fruits and vegetables.  Biometrics: Pre Biometrics - 06/06/18 1353      Pre Biometrics   Height  5' 8.1" (1.73 m)  (Pended)     Weight  222 lb 11.2 oz (101 kg)  (Pended)     Waist Circumference  42 inches  (Pended)     Hip Circumference  43 inches  (Pended)     Waist to Hip Ratio  0.98 %  (Pended)     BMI (Calculated)  33.75  (Pended)     Single Leg Stand  30 seconds  (Pended)         Nutrition Therapy Plan and Nutrition Goals: Nutrition Therapy & Goals - 06/06/18 1302      Intervention Plan   Expected Outcomes  Short Term Goal: Understand basic principles of dietary content, such as calories, fat, sodium, cholesterol and nutrients.;Short Term Goal: A plan has been developed with personal nutrition goals set during dietitian appointment.;Long Term Goal:  Adherence to prescribed nutrition plan.       Nutrition Assessments: Nutrition Assessments - 06/06/18 1302      MEDFICTS Scores   Pre Score  42       Nutrition Goals Re-Evaluation:   Nutrition Goals Discharge (Final Nutrition Goals Re-Evaluation):   Psychosocial: Target Goals: Acknowledge presence or absence of significant depression and/or stress, maximize coping skills, provide positive support system. Participant is able to verbalize types and ability to use techniques and skills needed for reducing stress and depression.   Initial Review & Psychosocial Screening: Initial Psych Review & Screening - 06/06/18 1306      Initial Review   Current issues with  Current Sleep Concerns;Current Stress Concerns    Source of Stress Concerns  Family;Occupation    Comments  [employer] in [employer]. Jerry's wife is staying with her Mom every night. 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 "That is hard"  Sleeps to about 3 AM, sometimes takes time to get back to sleep until up for the day at 545       Quality of Life Scores:  Quality of Life - 06/06/18 1255      Quality of Life   Select  Quality of Life      Quality of Life Scores   Health/Function Pre  6.1 %    Socioeconomic Pre  26.69 %    Psych/Spiritual Pre  14.29 %    Family Pre  21.6 %    GLOBAL Pre  14.66 %      Scores of 19 and below usually indicate a poorer quality of life in these areas.  A difference of  2-3 points is a clinically meaningful difference.  A difference of 2-3 points in the total score of the Quality of Life Index has been associated with significant improvement in overall quality of life, self-image, physical symptoms, and general health in studies assessing change in quality of life.  PHQ-9: Recent Review Flowsheet Data    [facility] 06/06/2018 12/20/2017 12/16/2017 08/26/2017 06/03/2017   Decreased Interest 1 2 2 1  0   Down, Depressed, Hopeless 1 3 3 1  0   PHQ - 2 Score 2 5 5   2  0   Altered sleeping 3 3 3 2  -   Tired, decreased energy 1 3 3 1  -   Change in appetite 0 0 0 0 -   Feeling bad or failure about yourself  2 1 1  0 -   Trouble concentrating 0 0 0 0 -   Moving slowly or fidgety/restless 0 0 0 0 -   Suicidal thoughts 0 0 0 0 -   PHQ-9 Score 8 12 12 5  -   Difficult doing work/chores Somewhat difficult - Not difficult at all Not difficult at all -     Interpretation of Total Score  Total Score Depression Severity:  1-4 = Minimal depression, 5-9 = Mild depression, 10-14 = Moderate depression, 15-19 = Moderately severe depression, 20-27 = Severe depression   Psychosocial Evaluation and Intervention:   Psychosocial Re-Evaluation:   Psychosocial Discharge (Final Psychosocial Re-Evaluation):   Vocational Rehabilitation: Provide vocational rehab assistance to qualifying candidates.   Vocational Rehab Evaluation & Intervention: Vocational Rehab - 06/06/18 1304      Initial Vocational Rehab Evaluation & Intervention   Assessment shows need for Vocational Rehabilitation  No       Education: Education Goals: Education classes will be provided on a variety of topics geared toward better understanding of heart health and risk factor modification. Participant will state understanding/return demonstration of topics presented as noted by education test scores.  Learning Barriers/Preferences: Learning Barriers/Preferences - 06/06/18 1303      Learning Barriers/Preferences   Learning Barriers  None    Learning Preferences  None       Education Topics:  AED/CPR: - Group verbal and written instruction with the use of models to demonstrate the basic use of the AED with the basic ABC's of resuscitation.   General Nutrition Guidelines/Fats and Fiber: -Group instruction provided by verbal, written material, models and posters to present the general guidelines for heart healthy nutrition. Gives an explanation and review of dietary fats and fiber.    Cardiac Rehab from 01/04/2018 in [facility] Cardiac and Pulmonary Rehab  Date  11/09/17  Educator  PI  Instruction Review Code  1- Verbalizes Understanding      Controlling Sodium/Reading Food Labels: -Group verbal and written material supporting the discussion of sodium use in heart healthy nutrition. Review and explanation with models, verbal and written materials for utilization of the food label.   Cardiac Rehab from 01/04/2018 in [facility] Cardiac and Pulmonary Rehab  Date  01/04/18  Educator  PI  Instruction Review Code  1- Verbalizes Understanding      Exercise Physiology & General Exercise Guidelines: - Group verbal and written instruction with models to review the exercise physiology of the cardiovascular system and associated critical values. Provides general exercise guidelines with specific guidelines to those with heart or lung disease.    Cardiac Rehab from 01/04/2018 in [facility] Cardiac and Pulmonary Rehab  Date  11/25/17  Educator  [facility]  Instruction Review Code  1- Verbalizes Understanding      Aerobic Exercise & Resistance Training: - Gives group verbal and written instruction on the various components of exercise. Focuses on aerobic and resistive training programs and the benefits of this training and how to safely progress through these programs..   Flexibility, Balance, Mind/Body Relaxation: Provides group verbal/written instruction on the benefits of flexibility and balance training, including mind/body exercise modes such as yoga, pilates and tai chi.  Demonstration and skill practice provided.   Cardiac Rehab from 01/04/2018 in [facility] Cardiac and Pulmonary Rehab  Date  10/14/17  Educator  AS  Instruction Review Code  1- Verbalizes Understanding      Stress and Anxiety: - Provides group verbal and written instruction about the health risks of elevated stress and causes of high stress.  Discuss the correlation between heart/lung disease and anxiety and treatment options. Review  healthy ways to manage with stress and anxiety.   Cardiac Rehab from 01/04/2018 in [facility] Cardiac and Pulmonary Rehab  Date  12/14/17  Educator  [facility]  Instruction Review Code  1- Verbalizes Understanding      Depression: - Provides group verbal and written instruction on the correlation between heart/lung disease and depressed mood, treatment options, and the stigmas associated with seeking treatment.   Anatomy & Physiology of the Heart: - Group verbal and written instruction and models provide basic cardiac anatomy and physiology, with the coronary electrical and arterial systems. Review of Valvular disease and Heart Failure   Cardiac Rehab from 01/04/2018 in [facility] Cardiac and Pulmonary Rehab  Date  12/16/17  Educator  CE  Instruction Review Code  1- Verbalizes Understanding      Cardiac Procedures: - Group verbal and written instruction to review commonly prescribed medications for heart disease. Reviews the medication, class of the drug, and side effects. Includes the steps to properly store meds and maintain the prescription regimen. (beta blockers and nitrates)   Cardiac Rehab from 01/04/2018 in [facility] Cardiac and Pulmonary Rehab  Date  11/11/17  Educator  CE  Instruction Review Code  1- Verbalizes Understanding      Cardiac Medications I: - Group verbal and written instruction to review commonly prescribed medications for heart disease. Reviews the medication, class of the drug, and side effects. Includes the steps to properly store meds and maintain the prescription regimen.   Cardiac Rehab from 01/04/2018 in [facility] Cardiac and Pulmonary Rehab  Date  12/21/17  Educator  SB  Instruction Review Code  1- Verbalizes Understanding      Cardiac Medications II: -Group verbal and written instruction to review commonly prescribed medications for heart disease. Reviews the medication, class of the drug, and side effects. (all other drug classes)   Cardiac Rehab from 01/04/2018 in [facility] Cardiac  and Pulmonary Rehab  Date  10/21/17  Educator  [facility]  Instruction Review Code  1- Verbalizes Understanding       Go Sex-Intimacy & Heart Disease, Get SMART - Goal Setting: - Group verbal and written instruction through game format to discuss heart disease and the return to sexual intimacy. Provides group verbal and written material to discuss and apply goal setting through the application of the S.M.A.R.T. Method.   Cardiac Rehab from 01/04/2018 in [facility] Cardiac and Pulmonary Rehab  Date  11/11/17  Educator  CE  Instruction Review Code  1- Verbalizes Understanding      Other Matters of the Heart: - Provides group verbal, written materials and models to describe Stable Angina and Peripheral Artery. Includes description of the disease process and treatment options available to the cardiac patient.   Cardiac Rehab from 01/04/2018 in [facility] Cardiac and Pulmonary Rehab  Date  12/16/17  Educator  CE  Instruction Review Code  1- Verbalizes Understanding      Exercise & Equipment Safety: - Individual verbal instruction and demonstration of equipment use and safety with use of the equipment.   Cardiac Rehab from 06/06/2018 in [facility] Cardiac and Pulmonary Rehab  Date  06/06/18  Educator  Sn  Instruction Review Code  1- Verbalizes Understanding  Infection Prevention: - Provides verbal and written material to individual with discussion of infection control including proper hand washing and proper equipment cleaning during exercise session.   Cardiac Rehab from 06/06/2018 in [facility] Cardiac and Pulmonary Rehab  Date  06/06/18  Educator  Sb  Instruction Review Code  1- Verbalizes Understanding      Falls Prevention: - Provides verbal and written material to individual with discussion of falls prevention and safety.   Cardiac Rehab from 06/06/2018 in [facility] Cardiac and Pulmonary Rehab  Date  06/06/18  Educator  Sb  Instruction Review Code  1- Verbalizes Understanding      Diabetes: -  Individual verbal and written instruction to review signs/symptoms of diabetes, desired ranges of glucose level fasting, after meals and with exercise. Acknowledge that pre and post exercise glucose checks will be done for 3 sessions at entry of program.   Know Your Numbers and Risk Factors: -Group verbal and written instruction about important numbers in your health.  Discussion of what are risk factors and how they play a role in the disease process.  Review of Cholesterol, Blood Pressure, Diabetes, and BMI and the role they play in your overall health.   Cardiac Rehab from 01/04/2018 in [facility] Cardiac and Pulmonary Rehab  Date  10/21/17  Educator  [facility]  Instruction Review Code  1- Verbalizes Understanding      Sleep Hygiene: -Provides group verbal and written instruction about how sleep can affect your health.  Define sleep hygiene, discuss sleep cycles and impact of sleep habits. Review good sleep hygiene tips.    Cardiac Rehab from 01/04/2018 in [facility] Cardiac and Pulmonary Rehab  Date  11/02/17  Educator  [facility]  Instruction Review Code  1- Verbalizes Understanding      Other: -Provides group and verbal instruction on various topics (see comments)   Knowledge Questionnaire Score: Knowledge Questionnaire Score - 06/06/18 1303      Knowledge Questionnaire Score   Pre Score  24/26   reviewd correct responses with [name] todauy. He verbalized understanding of the responses and had no further questions.       Core Components/Risk Factors/Patient Goals at Admission: Personal Goals and Risk Factors at Admission - 06/06/18 1305      Core Components/Risk Factors/Patient Goals on Admission    Weight Management  Yes;Obesity;Weight Loss    Admit Weight  214 lb (97.1 kg)    Goal Weight: Short Term  212 lb (96.2 kg)    Goal Weight: Long Term  190 lb (86.2 kg)    Expected Outcomes  Short Term: Continue to assess and modify interventions until short term weight is achieved;Long Term: Adherence to  nutrition and physical activity/exercise program aimed toward attainment of established weight goal;Weight Loss: Understanding of general recommendations for a balanced deficit meal plan, which promotes 1-2 lb weight loss per week and includes a negative energy balance of [phone] kcal/d    Hypertension  Yes    Intervention  Provide education on lifestyle modifcations including regular physical activity/exercise, weight management, moderate sodium restriction and increased consumption of fresh fruit, vegetables, and low fat dairy, alcohol moderation, and smoking cessation.;Monitor prescription use compliance.    Expected Outcomes  Short Term: Continued assessment and intervention until BP is < 140/18m HG in hypertensive participants. < 130/863mHG in hypertensive participants with diabetes, heart failure or chronic kidney disease.;Long Term: Maintenance of blood pressure at goal levels.    Lipids  Yes    Intervention  Provide education and support  for participant on nutrition & aerobic/resistive exercise along with prescribed medications to achieve LDL <45m, HDL >461m    Expected Outcomes  Short Term: Participant states understanding of desired cholesterol values and is compliant with medications prescribed. Participant is following exercise prescription and nutrition guidelines.;Long Term: Cholesterol controlled with medications as prescribed, with individualized exercise RX and with personalized nutrition plan. Value goals: LDL < 7024mHDL > 40 mg.       Core Components/Risk Factors/Patient Goals Review:    Core Components/Risk Factors/Patient Goals at Discharge (Final Review):    ITP Comments: ITP Comments    Row Name 06/06/18 1240           ITP Comments  Medical review completed today. New diagnosis new program start.  ITP sent to Dr MilSabra Heckr review, changes as needed and signature. [facility]          Comments: Initial ITP

## 2018-06-06 NOTE — Patient Instructions (Signed)
Patient Instructions  Patient Details  Name: Darrell [name] MRN: [id number] Date of Birth: Mar 30, 1946 Referring Provider:  Corey Skains, MD  Below are your personal goals for exercise, nutrition, and risk factors. Our goal is to help you stay on track towards obtaining and maintaining these goals. We will be discussing your progress on these goals with you throughout the program.  Initial Exercise Prescription: Initial Exercise Prescription - 06/06/18 1300      Date of Initial Exercise RX and Referring Provider   Date  06/06/18    Referring Provider  Serafina Royals MD      Treadmill   MPH  2.3    Grade  0.5    Minutes  15    METs  2.93      Elliptical   Level  1    Speed  3    Minutes  15      T5 Nustep   Level  3    SPM  80    Minutes  15    METs  3      Prescription Details   Frequency (times per week)  2    Duration  Progress to 30 minutes of continuous aerobic without signs/symptoms of physical distress      Intensity   THRR 40-80% of Max Heartrate  104-135    Ratings of Perceived Exertion  11-13    Perceived Dyspnea  0-4      Progression   Progression  Continue to progress workloads to maintain intensity without signs/symptoms of physical distress.      Resistance Training   Training Prescription  Yes    Weight  4 lbs    Reps  10-15       Exercise Goals: Frequency: Be able to perform aerobic exercise two to three times per week in program working toward 2-5 days per week of home exercise.  Intensity: Work with a perceived exertion of 11 (fairly light) - 15 (hard) while following your exercise prescription.  We will make changes to your prescription with you as you progress through the program.   Duration: Be able to do 30 to 45 minutes of continuous aerobic exercise in addition to a 5 minute warm-up and a 5 minute cool-down routine.   Nutrition Goals: Your personal nutrition goals will be established when you do your nutrition analysis with the  dietician.  The following are general nutrition guidelines to follow: Cholesterol < 200mg /day Sodium < 1500mg /day Fiber: Men over 50 yrs - 30 grams per day  Personal Goals: Personal Goals and Risk Factors at Admission - 06/06/18 1305      Core Components/Risk Factors/Patient Goals on Admission    Weight Management  Yes;Obesity;Weight Loss    Admit Weight  214 lb (97.1 kg)    Goal Weight: Short Term  212 lb (96.2 kg)    Goal Weight: Long Term  190 lb (86.2 kg)    Expected Outcomes  Short Term: Continue to assess and modify interventions until short term weight is achieved;Long Term: Adherence to nutrition and physical activity/exercise program aimed toward attainment of established weight goal;Weight Loss: Understanding of general recommendations for a balanced deficit meal plan, which promotes 1-2 lb weight loss per week and includes a negative energy balance of [phone] kcal/d    Hypertension  Yes    Intervention  Provide education on lifestyle modifcations including regular physical activity/exercise, weight management, moderate sodium restriction and increased consumption of fresh fruit, vegetables, and low fat dairy,  alcohol moderation, and smoking cessation.;Monitor prescription use compliance.    Expected Outcomes  Short Term: Continued assessment and intervention until BP is < 140/7mm HG in hypertensive participants. < 130/39mm HG in hypertensive participants with diabetes, heart failure or chronic kidney disease.;Long Term: Maintenance of blood pressure at goal levels.    Lipids  Yes    Intervention  Provide education and support for participant on nutrition & aerobic/resistive exercise along with prescribed medications to achieve LDL 70mg , HDL >40mg .    Expected Outcomes  Short Term: Participant states understanding of desired cholesterol values and is compliant with medications prescribed. Participant is following exercise prescription and nutrition guidelines.;Long Term: Cholesterol  controlled with medications as prescribed, with individualized exercise RX and with personalized nutrition plan. Value goals: LDL < 70mg , HDL > 40 mg.       Tobacco Use Initial Evaluation: Social History   Tobacco Use  Smoking Status Former Smoker  . Types: Cigarettes  Smokeless Tobacco Never Used  Tobacco Comment   quit 30 years ago     Exercise Goals and Review: Exercise Goals    Row Name 06/06/18 1351             Exercise Goals   Increase Physical Activity  Yes       Intervention  Provide advice, education, support and counseling about physical activity/exercise needs.;Develop an individualized exercise prescription for aerobic and resistive training based on initial evaluation findings, risk stratification, comorbidities and participant's personal goals.       Expected Outcomes  Short Term: Attend rehab on a regular basis to increase amount of physical activity.;Long Term: Add in home exercise to make exercise part of routine and to increase amount of physical activity.;Long Term: Exercising regularly at least 3-5 days a week.       Increase Strength and Stamina  Yes       Intervention  Provide advice, education, support and counseling about physical activity/exercise needs.;Develop an individualized exercise prescription for aerobic and resistive training based on initial evaluation findings, risk stratification, comorbidities and participant's personal goals.       Expected Outcomes  Short Term: Perform resistance training exercises routinely during rehab and add in resistance training at home;Long Term: Improve cardiorespiratory fitness, muscular endurance and strength as measured by increased METs and functional capacity (6MWT);Short Term: Increase workloads from initial exercise prescription for resistance, speed, and METs.       Able to understand and use rate of perceived exertion (RPE) scale  Yes       Intervention  Provide education and explanation on how to use RPE scale        Expected Outcomes  Short Term: Able to use RPE daily in rehab to express subjective intensity level;Long Term:  Able to use RPE to guide intensity level when exercising independently       Knowledge and understanding of Target Heart Rate Range (THRR)  Yes       Intervention  Provide education and explanation of THRR including how the numbers were predicted and where they are located for reference       Expected Outcomes  Short Term: Able to state/look up THRR;Long Term: Able to use THRR to govern intensity when exercising independently;Short Term: Able to use daily as guideline for intensity in rehab       Able to check pulse independently  Yes       Intervention  Provide education and demonstration on how to check pulse in carotid and radial arteries.;Review  the importance of being able to check your own pulse for safety during independent exercise       Expected Outcomes  Short Term: Able to explain why pulse checking is important during independent exercise;Long Term: Able to check pulse independently and accurately       Understanding of Exercise Prescription  Yes       Intervention  Provide education, explanation, and written materials on patient's individual exercise prescription       Expected Outcomes  Short Term: Able to explain program exercise prescription;Long Term: Able to explain home exercise prescription to exercise independently          Copy of goals given to participant.

## 2018-06-08 ENCOUNTER — Ambulatory Visit: Payer: Medicare Other

## 2018-06-09 ENCOUNTER — Ambulatory Visit (INDEPENDENT_AMBULATORY_CARE_PROVIDER_SITE_OTHER): Payer: Medicare Other

## 2018-06-09 VITALS — BP 140/78 | HR 62 | Temp 98.2°F | Resp 15 | Ht 67.5 in | Wt 225.0 lb

## 2018-06-09 DIAGNOSIS — Z0000 Encounter for general adult medical examination without abnormal findings: Secondary | ICD-10-CM

## 2018-06-09 NOTE — Progress Notes (Signed)
Subjective:   Darrell [name] is a 72 y.o. male who presents for Medicare Annual/Subsequent preventive examination.  Review of Systems:  Cardiac Risk Factors include: male gender;advanced age (>3men, >33 women);obesity (BMI >30kg/m2);hypertension;dyslipidemia     Objective:    Vitals: BP 140/78   Pulse 62   Temp 98.2 F (36.8 C)   Resp 15   Ht 5' 7.5" (1.715 m)   Wt 225 lb (102.1 kg)   BMI 34.72 kg/m   Body mass index is 34.72 kg/m.  Advanced Directives 06/09/2018 03/25/2018 08/26/2017 07/23/2017 06/03/2017 05/21/2017 05/20/2017  Does Patient Have a Medical Advance Directive? Yes Yes Yes - Yes No No  Type of Advance Directive [place];Living will Healthcare Power of Attorney Living will Living will;Healthcare Power of [place];Living will - -  Copy of [place] in Chart? Yes Yes - - No - copy requested - -  Would patient like information on creating a medical advance directive? - - - - - No - Patient declined No - Patient declined    Tobacco Social History   Tobacco Use  Smoking Status Former Smoker  . Types: Cigarettes  . Last attempt to quit: 1989  . Years since quitting: 30.8  Smokeless Tobacco Never Used  Tobacco Comment   quit 30 years ago      Counseling given: Not Answered Comment: quit 30 years ago    Clinical Intake:  Pre-visit preparation completed: Yes  Pain : 0-10 Pain Score: 2  Pain Type: Acute pain Pain Location: Rib cage Pain Orientation: Right Pain Descriptors / Indicators: Other (Comment)(post surgical pain heart bypass)     Nutritional Status: BMI > 30  Obese Nutritional Risks: None Diabetes: No  How often do you need to have someone help you when you read instructions, pamphlets, or other written materials from your doctor or pharmacy?: 1 - Never What is the last grade level you completed in school?: 13 years  Interpreter Needed?: No  Information entered by :: Clemetine Marker LPN  Past Medical History:  Diagnosis Date  . CAD (coronary artery disease)   . Cancer [facility])    prostate  . Carotid artery stenosis   . Hyperlipidemia   . Hypertension   . Psoriasis    Past Surgical History:  Procedure Laterality Date  . CHOLECYSTECTOMY    . COLONOSCOPY    . COLONOSCOPY WITH PROPOFOL N/A 03/25/2018   Procedure: COLONOSCOPY WITH PROPOFOL;  Surgeon: Jonathon Bellows, MD;  Location: [facility] ENDOSCOPY;  Service: Gastroenterology;  Laterality: N/A;  . CORONARY ANGIOPLASTY WITH STENT PLACEMENT    . CORONARY ARTERY BYPASS GRAFT Right 04/21/2018  . LEFT HEART CATH AND CORONARY ANGIOGRAPHY N/A 05/21/2017   Procedure: LEFT HEART CATH AND CORONARY ANGIOGRAPHY;  Surgeon: Teodoro Spray, MD;  Location: [facility];  Service: Cardiovascular;  Laterality: N/A;  . LIVER BIOPSY  1999  . SHOULDER SURGERY    . stents     Family History  Problem Relation Age of Onset  . Heart attack Mother   . Diabetes Mother   . Stroke Mother   . Heart disease Mother   . Diabetes Brother   . Heart Problems Brother   . Diabetes Sister   . Lung cancer Maternal Aunt   . Esophageal cancer Sister    Social History   Socioeconomic History  . Marital status: Married    Spouse name: Not on file  . Number of children: Not on  file  . Years of education: 73  . Highest education level: Some college, no degree  Occupational History  . Occupation: retired    Comment: semi-retired [employer]  Social Needs  . Financial resource strain: Not hard at all  . Food insecurity:    Worry: Never true    Inability: Never true  . Transportation needs:    Medical: No    Non-medical: No  Tobacco Use  . Smoking status: Former Smoker    Types: Cigarettes    Last attempt to quit: 1989    Years since quitting: 30.8  . Smokeless tobacco: Never Used  . Tobacco comment: quit 30 years ago   Substance and Sexual Activity  . Alcohol use: Yes    Alcohol/week: 1.0 standard drinks    Types: 1 Cans of  beer per week    Comment: occasioanlly   . Drug use: No  . Sexual activity: Not on file  Lifestyle  . Physical activity:    Days per week: 4 days    Minutes per session: 70 min  . Stress: Patient refused  Relationships  . Social connections:    Talks on phone: More than three times a week    Gets together: More than three times a week    Attends religious service: More than 4 times per year    Active member of club or organization: Yes    Attends meetings of clubs or organizations: More than 4 times per year    Relationship status: Married  Other Topics Concern  . Not on file  Social History Narrative  . Not on file    Outpatient Encounter Medications as of 06/09/2018  Medication Sig  . allopurinol (ZYLOPRIM) 300 MG tablet Take 0.5 tablets (150 mg total) by mouth daily.  Marland Kitchen apixaban (ELIQUIS) 5 MG TABS tablet Take by mouth.  Marland Kitchen aspirin EC 81 MG tablet Take by mouth.  Marland Kitchen atorvastatin (LIPITOR) 80 MG tablet Take 80 mg by mouth daily.  . carvedilol (COREG) 6.25 MG tablet Take by mouth.  . isosorbide mononitrate (IMDUR) 30 MG 24 hr tablet Take by mouth.  . telmisartan (MICARDIS) 80 MG tablet Take by mouth.  . clopidogrel (PLAVIX) 75 MG tablet Take 75 mg by mouth daily.  . hydrochlorothiazide (HYDRODIURIL) 25 MG tablet Take 1 tablet (25 mg total) by mouth daily. (Patient not taking: Reported on 06/06/2018)  . nitroGLYCERIN (NITROSTAT) 0.4 MG SL tablet Place 1 tablet (0.4 mg total) under the tongue every 5 (five) minutes as needed for chest pain. (Patient not taking: Reported on 06/06/2018)   No facility-administered encounter medications on file as of 06/09/2018.     Activities of Daily Living In your present state of health, do you have any difficulty performing the following activities: 06/09/2018 02/16/2018  Hearing? N N  Comment declines hearing aids -  Vision? N N  Comment wears glasses -  Difficulty concentrating or making decisions? N N  Walking or climbing stairs? N N    Dressing or bathing? N N  Doing errands, shopping? N N  Preparing Food and eating ? N -  Comment declines dentures -  Using the Toilet? N -  In the past six months, have you accidently leaked urine? N -  Do you have problems with loss of bowel control? N -  Managing your Medications? N -  Managing your Finances? N -  Housekeeping or managing your Housekeeping? N -  Some recent data might be hidden  Patient Care Team: Guadalupe Maple, MD as PCP - General (Family Medicine) Lucky Cowboy, Erskine Squibb, MD as Referring Physician (Vascular Surgery) Royston Cowper, MD (Urology) Corey Skains, MD as Consulting Physician (Internal Medicine) Dasher, Rayvon Char, MD (Dermatology)   Assessment:   This is a routine wellness examination for Shea.  Exercise Activities and Dietary recommendations Current Exercise Habits: Home exercise routine, Type of exercise: walking, Time (Minutes): > 60(75 minutes), Frequency (Times/Week): 4, Weekly Exercise (Minutes/Week): 0, Intensity: Moderate, Exercise limited by: cardiac condition(s);Other - see comments(told to avoid the gym for now due to bypass surgery recovery)  Goals    . Increase water intake     Recommend drinking at least 7-8 glasses of water a day       Fall Risk Fall Risk  06/09/2018 06/06/2018 02/16/2018 01/24/2018 12/20/2017  Falls in the past year? No No No No No   FALL RISK PREVENTION PERTAINING TO THE HOME:  Any stairs in or around the home WITH handrails? Yes  Home free of loose throw rugs in walkways, pet beds, electrical cords, etc? Yes  Adequate lighting in your home to reduce risk of falls? Yes   ASSISTIVE DEVICES UTILIZED TO PREVENT FALLS:  Life alert? No  Use of a cane, walker or w/c? No  Grab bars in the bathroom? Yes Shower chair or bench in shower? No  Elevated toilet seat or a handicapped toilet? No   DME ORDERS:  DME order needed?  No   TIMED UP AND GO:  Was the test performed? Yes .  Length of time to ambulate 10  feet: 6 sec.   GAIT:  Appearance of gait: Gait stead-fast and without the use of an assistive device.   Education: Fall risk prevention has been discussed.  Intervention(s) required? No    Depression Screen PHQ 2/9 Scores 06/09/2018 06/06/2018 12/20/2017 12/16/2017  PHQ - 2 Score 2 2 5 5   PHQ- 9 Score 5 8 12 12     Cognitive Function     6CIT Screen 06/09/2018 06/03/2017  What Year? 0 points 0 points  What month? 0 points 0 points  What time? 0 points 0 points  Count back from 20 0 points 0 points  Months in reverse 0 points 0 points  Repeat phrase 0 points 0 points  Total Score 0 0    Immunization History  Administered Date(s) Administered  . Influenza, High Dose Seasonal PF 06/05/2017, 06/03/2018  . Influenza-Unspecified 08/09/2015  . Pneumococcal Conjugate-13 05/07/2014  . Pneumococcal Polysaccharide-23 08/02/2012  . Td 09/25/2009    Qualifies for Shingles Vaccine? Yes   Due for Shingrix. Education has been provided regarding the importance of this vaccine. Pt has been advised to call insurance company to determine out of pocket expense. Advised may also receive vaccine at local pharmacy or Health Dept. Verbalized acceptance and understanding.  Tdap: up to date 09/25/09  Flu Vaccine: up to date 06/03/18  Pneumococcal Vaccine: completed series Screening Tests Health Maintenance  Topic Date Due  . INFLUENZA VACCINE  03/17/2018  . TETANUS/TDAP  09/26/2019  . COLONOSCOPY  03/25/2021  . Hepatitis C Screening  Completed  . PNA vac Low Risk Adult  Completed     Cancer Screenings:  Colorectal Screening: Completed 03/25/18. Repeat every 3 years.   Lung Cancer Screening: (Low Dose CT Chest recommended if Age 43-80 years, 30 pack-year currently smoking OR have quit w/in 15years.) does not qualify.    Additional Screening:  Hepatitis C Screening: does qualify; Completed 05/19/16.  Vision Screening: Recommended annual ophthalmology exams for early detection of glaucoma  and other disorders of the eye. Is the patient up to date with their annual eye exam?  Yes  Who is the provider or what is the name of the office in which the pt attends annual eye exams? Dr. Ellin Mayhew   Dental Screening: Recommended annual dental exams for proper oral hygiene  Community Resource Referral:  CRR required this visit?  No  Plan:    I have personally reviewed and addressed the Medicare Annual Wellness questionnaire and have noted the following in the patient's chart:  A. Medical and social history B. Use of alcohol, tobacco or illicit drugs  C. Current medications and supplements D. Functional ability and status E.  Nutritional status F.  Physical activity G. Advance directives H. List of other physicians I.  Hospitalizations, surgeries, and ER visits in previous 12 months J.  [place] such as hearing and vision if needed, cognitive and depression L. Referrals and appointments   In addition, I have reviewed and discussed with patient certain preventive protocols, quality metrics, and best practice recommendations. A written personalized care plan for preventive services as well as general preventive health recommendations were provided to patient.   Signed,  Clemetine Marker, LPN Nurse Health Advisor   Nurse Notes:

## 2018-06-09 NOTE — Patient Instructions (Addendum)
Darrell [name] , Thank you for taking time to come for your Medicare Wellness Visit. I appreciate your ongoing commitment to your health goals. Please review the following plan we discussed and let me know if I can assist you in the future.   Screening recommendations/referrals: Colonoscopy: up to date Recommended yearly ophthalmology/optometry visit for glaucoma screening and checkup Recommended yearly dental visit for hygiene and checkup  Vaccinations: Shingles vaccine: Shingarix discussed and recommended to discuss at pharmacy  Goals: Recommend to drink at least 6-8 8oz glasses of water per day.      Next appointment: 08/23/2018 10:30 am  Preventive Care 65 Years and Older, Male Preventive care refers to lifestyle choices and visits with your health care provider that can promote health and wellness. What does preventive care include?  A yearly physical exam. This is also called an annual well check.  Dental exams once or twice a year.  Routine eye exams. Ask your health care provider how often you should have your eyes checked.  Personal lifestyle choices, including:  Daily care of your teeth and gums.  Regular physical activity.  Eating a healthy diet.  Avoiding tobacco and drug use.  Limiting alcohol use.  Practicing safe sex.  Taking low doses of aspirin every day.  Taking vitamin and mineral supplements as recommended by your health care provider. What happens during an annual well check? The services and screenings done by your health care provider during your annual well check will depend on your age, overall health, lifestyle risk factors, and family history of disease. Counseling  Your health care provider may ask you questions about your:  Alcohol use.  Tobacco use.  Drug use.  Emotional well-being.  Home and relationship well-being.  Sexual activity.  Eating habits.  History of falls.  Memory and ability to understand (cognition).  Work  and work Statistician. Screening  You may have the following tests or measurements:  Height, weight, and BMI.  Blood pressure.  Lipid and cholesterol levels. These may be checked every 5 years, or more frequently if you are over 32 years old.  Skin check.  Lung cancer screening. You may have this screening every year starting at age 97 if you have a 30-pack-year history of smoking and currently smoke or have quit within the past 15 years.  Fecal occult blood test (FOBT) of the stool. You may have this test every year starting at age 47.  Flexible sigmoidoscopy or colonoscopy. You may have a sigmoidoscopy every 5 years or a colonoscopy every 10 years starting at age 28.  Prostate cancer screening. Recommendations will vary depending on your family history and other risks.  Hepatitis C blood test.  Hepatitis B blood test.  Sexually transmitted disease (STD) testing.  Diabetes screening. This is done by checking your blood sugar (glucose) after you have not eaten for a while (fasting). You may have this done every 1-3 years.  Abdominal aortic aneurysm (AAA) screening. You may need this if you are a current or former smoker.  Osteoporosis. You may be screened starting at age 41 if you are at high risk. Talk with your health care provider about your test results, treatment options, and if necessary, the need for more tests. Vaccines  Your health care provider may recommend certain vaccines, such as:  Influenza vaccine. This is recommended every year.  Tetanus, diphtheria, and acellular pertussis (Tdap, Td) vaccine. You may need a Td booster every 10 years.  Zoster vaccine. You may need this after  age 3.  Pneumococcal 13-valent conjugate (PCV13) vaccine. One dose is recommended after age 52.  Pneumococcal polysaccharide (PPSV23) vaccine. One dose is recommended after age 108. Talk to your health care provider about which screenings and vaccines you need and how often you need  them. This information is not intended to replace advice given to you by your health care provider. Make sure you discuss any questions you have with your health care provider. Document Released: 08/30/2015 Document Revised: 04/22/2016 Document Reviewed: 06/04/2015 Elsevier Interactive Patient Education  2017 [place] Prevention in the Home Falls can cause injuries. They can happen to people of all ages. There are many things you can do to make your home safe and to help prevent falls. What can I do on the outside of my home?  Regularly fix the edges of walkways and driveways and fix any cracks.  Remove anything that might make you trip as you walk through a door, such as a raised step or threshold.  Trim any bushes or trees on the path to your home.  Use bright outdoor lighting.  Clear any walking paths of anything that might make someone trip, such as rocks or tools.  Regularly check to see if handrails are loose or broken. Make sure that both sides of any steps have handrails.  Any raised decks and porches should have guardrails on the edges.  Have any leaves, snow, or ice cleared regularly.  Use sand or salt on walking paths during winter.  Clean up any spills in your garage right away. This includes oil or grease spills. What can I do in the bathroom?  Use night lights.  Install grab bars by the toilet and in the tub and shower. Do not use towel bars as grab bars.  Use non-skid mats or decals in the tub or shower.  If you need to sit down in the shower, use a plastic, non-slip stool.  Keep the floor dry. Clean up any water that spills on the floor as soon as it happens.  Remove soap buildup in the tub or shower regularly.  Attach bath mats securely with double-sided non-slip rug tape.  Do not have throw rugs and other things on the floor that can make you trip. What can I do in the bedroom?  Use night lights.  Make sure that you have a light by your  bed that is easy to reach.  Do not use any sheets or blankets that are too big for your bed. They should not hang down onto the floor.  Have a firm chair that has side arms. You can use this for support while you get dressed.  Do not have throw rugs and other things on the floor that can make you trip. What can I do in the kitchen?  Clean up any spills right away.  Avoid walking on wet floors.  Keep items that you use a lot in easy-to-reach places.  If you need to reach something above you, use a strong step stool that has a grab bar.  Keep electrical cords out of the way.  Do not use floor polish or wax that makes floors slippery. If you must use wax, use non-skid floor wax.  Do not have throw rugs and other things on the floor that can make you trip. What can I do with my stairs?  Do not leave any items on the stairs.  Make sure that there are handrails on both sides of the stairs  and use them. Fix handrails that are broken or loose. Make sure that handrails are as long as the stairways.  Check any carpeting to make sure that it is firmly attached to the stairs. Fix any carpet that is loose or worn.  Avoid having throw rugs at the top or bottom of the stairs. If you do have throw rugs, attach them to the floor with carpet tape.  Make sure that you have a light switch at the top of the stairs and the bottom of the stairs. If you do not have them, ask someone to add them for you. What else can I do to help prevent falls?  Wear shoes that:  Do not have high heels.  Have rubber bottoms.  Are comfortable and fit you well.  Are closed at the toe. Do not wear sandals.  If you use a stepladder:  Make sure that it is fully opened. Do not climb a closed stepladder.  Make sure that both sides of the stepladder are locked into place.  Ask someone to hold it for you, if possible.  Clearly mark and make sure that you can see:  Any grab bars or handrails.  First and last  steps.  Where the edge of each step is.  Use tools that help you move around (mobility aids) if they are needed. These include:  Canes.  Walkers.  Scooters.  Crutches.  Turn on the lights when you go into a dark area. Replace any light bulbs as soon as they burn out.  Set up your furniture so you have a clear path. Avoid moving your furniture around.  If any of your floors are uneven, fix them.  If there are any pets around you, be aware of where they are.  Review your medicines with your doctor. Some medicines can make you feel dizzy. This can increase your chance of falling. Ask your doctor what other things that you can do to help prevent falls. This information is not intended to replace advice given to you by your health care provider. Make sure you discuss any questions you have with your health care provider. Document Released: 05/30/2009 Document Revised: 01/09/2016 Document Reviewed: 09/07/2014 Elsevier Interactive Patient Education  2017 [employer].

## 2018-06-13 DIAGNOSIS — Z951 Presence of aortocoronary bypass graft: Secondary | ICD-10-CM | POA: Diagnosis not present

## 2018-06-13 DIAGNOSIS — I25810 Atherosclerosis of coronary artery bypass graft(s) without angina pectoris: Secondary | ICD-10-CM | POA: Diagnosis not present

## 2018-06-13 DIAGNOSIS — E782 Mixed hyperlipidemia: Secondary | ICD-10-CM | POA: Diagnosis not present

## 2018-06-13 DIAGNOSIS — G4733 Obstructive sleep apnea (adult) (pediatric): Secondary | ICD-10-CM | POA: Diagnosis not present

## 2018-06-13 DIAGNOSIS — I480 Paroxysmal atrial fibrillation: Secondary | ICD-10-CM | POA: Diagnosis not present

## 2018-06-13 DIAGNOSIS — I10 Essential (primary) hypertension: Secondary | ICD-10-CM | POA: Diagnosis not present

## 2018-06-13 DIAGNOSIS — I255 Ischemic cardiomyopathy: Secondary | ICD-10-CM | POA: Diagnosis not present

## 2018-06-13 DIAGNOSIS — I440 Atrioventricular block, first degree: Secondary | ICD-10-CM | POA: Diagnosis not present

## 2018-06-13 DIAGNOSIS — I951 Orthostatic hypotension: Secondary | ICD-10-CM | POA: Diagnosis not present

## 2018-06-14 ENCOUNTER — Encounter: Payer: Medicare Other | Admitting: *Deleted

## 2018-06-14 DIAGNOSIS — Z951 Presence of aortocoronary bypass graft: Secondary | ICD-10-CM

## 2018-06-14 DIAGNOSIS — Z79899 Other long term (current) drug therapy: Secondary | ICD-10-CM | POA: Diagnosis not present

## 2018-06-14 DIAGNOSIS — Z7901 Long term (current) use of anticoagulants: Secondary | ICD-10-CM | POA: Diagnosis not present

## 2018-06-14 DIAGNOSIS — Z7982 Long term (current) use of aspirin: Secondary | ICD-10-CM | POA: Diagnosis not present

## 2018-06-14 DIAGNOSIS — I2510 Atherosclerotic heart disease of native coronary artery without angina pectoris: Secondary | ICD-10-CM | POA: Diagnosis not present

## 2018-06-14 DIAGNOSIS — Z7902 Long term (current) use of antithrombotics/antiplatelets: Secondary | ICD-10-CM | POA: Diagnosis not present

## 2018-06-14 NOTE — Progress Notes (Signed)
Daily Session Note  Patient Details  Name: Darrell [name] MRN: [id number] Date of Birth: [birth year] Referring Provider:     Cardiac Rehab from 06/06/2018 in [facility]  Referring Provider  Serafina Royals MD      Encounter Date: 06/14/2018  Check In: Session Check In - 06/14/18 0917      Check-In   Supervising physician immediately available to respond to emergencies  See telemetry face sheet for immediately available ER MD    Location  [facility]-Cardiac & Pulmonary Rehab    Staff Present  Alberteen Sam, MA, RCEP, CCRP, Exercise Physiologist;Amanda Oletta Darter, BA, ACSM CEP, Exercise Physiologist;Krista Frederico Hamman, RN BSN    Medication changes reported      No    Fall or balance concerns reported     No    Warm-up and Cool-down  Performed on first and last piece of equipment    Resistance Training Performed  Yes    VAD Patient?  No    PAD/SET Patient?  No      Pain Assessment   Currently in Pain?  No/denies          Social History   Tobacco Use  Smoking Status Former Smoker  . Types: Cigarettes  . Last attempt to quit: 1989  . Years since quitting: 30.8  Smokeless Tobacco Never Used  Tobacco Comment   quit 30 years ago     Goals Met:  Exercise tolerated well Personal goals reviewed No report of cardiac concerns or symptoms Strength training completed today  Goals Unmet:  Not Applicable  Comments: First full day of exercise!  Patient was oriented to gym and equipment including functions, settings, policies, and procedures.  Patient's individual exercise prescription and treatment plan were reviewed.  All starting workloads were established based on the results of the 6 minute walk test done at initial orientation visit.  The plan for exercise progression was also introduced and progression will be customized based on patient's performance and goals.    Dr. Emily Filbert is Medical Director for [place] and [facility]  Pulmonary Rehabilitation.

## 2018-06-15 DIAGNOSIS — C44622 Squamous cell carcinoma of skin of right upper limb, including shoulder: Secondary | ICD-10-CM | POA: Diagnosis not present

## 2018-06-15 DIAGNOSIS — D0461 Carcinoma in situ of skin of right upper limb, including shoulder: Secondary | ICD-10-CM | POA: Diagnosis not present

## 2018-06-16 ENCOUNTER — Inpatient Hospital Stay: Payer: Medicare Other | Admitting: Family Medicine

## 2018-06-17 ENCOUNTER — Encounter: Payer: Medicare Other | Attending: Internal Medicine

## 2018-06-17 DIAGNOSIS — I2510 Atherosclerotic heart disease of native coronary artery without angina pectoris: Secondary | ICD-10-CM | POA: Insufficient documentation

## 2018-06-17 DIAGNOSIS — Z8546 Personal history of malignant neoplasm of prostate: Secondary | ICD-10-CM | POA: Diagnosis not present

## 2018-06-17 DIAGNOSIS — Z7982 Long term (current) use of aspirin: Secondary | ICD-10-CM | POA: Diagnosis not present

## 2018-06-17 DIAGNOSIS — Z87891 Personal history of nicotine dependence: Secondary | ICD-10-CM | POA: Diagnosis not present

## 2018-06-17 DIAGNOSIS — I629 Nontraumatic intracranial hemorrhage, unspecified: Secondary | ICD-10-CM | POA: Diagnosis not present

## 2018-06-17 DIAGNOSIS — Z951 Presence of aortocoronary bypass graft: Secondary | ICD-10-CM

## 2018-06-17 DIAGNOSIS — L409 Psoriasis, unspecified: Secondary | ICD-10-CM | POA: Insufficient documentation

## 2018-06-17 DIAGNOSIS — Z7902 Long term (current) use of antithrombotics/antiplatelets: Secondary | ICD-10-CM | POA: Insufficient documentation

## 2018-06-17 DIAGNOSIS — Z7901 Long term (current) use of anticoagulants: Secondary | ICD-10-CM | POA: Diagnosis not present

## 2018-06-17 DIAGNOSIS — E785 Hyperlipidemia, unspecified: Secondary | ICD-10-CM | POA: Insufficient documentation

## 2018-06-17 DIAGNOSIS — Z955 Presence of coronary angioplasty implant and graft: Secondary | ICD-10-CM

## 2018-06-17 DIAGNOSIS — I10 Essential (primary) hypertension: Secondary | ICD-10-CM | POA: Insufficient documentation

## 2018-06-17 DIAGNOSIS — Z79899 Other long term (current) drug therapy: Secondary | ICD-10-CM | POA: Insufficient documentation

## 2018-06-17 NOTE — Progress Notes (Signed)
Daily Session Note  Patient Details  Name: Darrell [name] MRN: [id number] Date of Birth: 12-06-1945 Referring Provider:     Cardiac Rehab from 06/06/2018 in [facility] Cardiac and [facility] Re[facility]  Referring Provider  Serafina Royals MD      Encounter Date: 06/17/2018  Check In:      Social History   Tobacco Use  Smoking Status Former Smoker  . Types: Cigarettes  . Last attempt to quit: 1989  . Years since quitting: 30.8  Smokeless Tobacco Never Used  Tobacco Comment   quit 30 years ago     Goals Met:  Independence with exercise equipment Exercise tolerated well No report of cardiac concerns or symptoms Strength training completed today  Goals Unmet:  Not Applicable  Comments: Pt able to follow exercise prescription today without complaint.  Will continue to monitor for progression.    Dr. Emily Filbert is Medical Director for [place] and [facility].

## 2018-06-22 ENCOUNTER — Encounter: Payer: Self-pay | Admitting: *Deleted

## 2018-06-22 DIAGNOSIS — Z951 Presence of aortocoronary bypass graft: Secondary | ICD-10-CM

## 2018-06-22 NOTE — Progress Notes (Signed)
Cardiac Individual Treatment Plan  Patient Details  Name: Darrell [name] MRN: [id number] Date of Birth: 12-15-1945 Referring Provider:     Cardiac Rehab from 06/06/2018 in [facility] Cardiac and Pulmonary Rehab  Referring Provider  Serafina Royals MD      Initial Encounter Date:    Cardiac Rehab from 06/06/2018 in [facility] Cardiac and Pulmonary Rehab  Date  06/06/18      Visit Diagnosis: S/P CABG x 2  Patient's Home Medications on Admission:  Current Outpatient Medications:  .  allopurinol (ZYLOPRIM) 300 MG tablet, Take 0.5 tablets (150 mg total) by mouth daily., Disp: 90 tablet, Rfl: 4 .  apixaban (ELIQUIS) 5 MG TABS tablet, Take by mouth., Disp: , Rfl:  .  aspirin EC 81 MG tablet, Take by mouth., Disp: , Rfl:  .  atorvastatin (LIPITOR) 80 MG tablet, Take 80 mg by mouth daily., Disp: , Rfl:  .  carvedilol (COREG) 6.25 MG tablet, Take by mouth., Disp: , Rfl:  .  clopidogrel (PLAVIX) 75 MG tablet, Take 75 mg by mouth daily., Disp: , Rfl:  .  hydrochlorothiazide (HYDRODIURIL) 25 MG tablet, Take 1 tablet (25 mg total) by mouth daily. (Patient not taking: Reported on 06/06/2018), Disp: 90 tablet, Rfl: 4 .  isosorbide mononitrate (IMDUR) 30 MG 24 hr tablet, Take by mouth., Disp: , Rfl:  .  nitroGLYCERIN (NITROSTAT) 0.4 MG SL tablet, Place 1 tablet (0.4 mg total) under the tongue every 5 (five) minutes as needed for chest pain. (Patient not taking: Reported on 06/06/2018), Disp: 25 tablet, Rfl: 1 .  telmisartan (MICARDIS) 80 MG tablet, Take by mouth., Disp: , Rfl:   Past Medical History: Past Medical History:  Diagnosis Date  . CAD (coronary artery disease)   . Cancer [facility])    prostate  . Carotid artery stenosis   . Hyperlipidemia   . Hypertension   . Psoriasis     Tobacco Use: Social History   Tobacco Use  Smoking Status Former Smoker  . Types: Cigarettes  . Last attempt to quit: 1989  . Years since quitting: 30.8  Smokeless Tobacco Never Used  Tobacco Comment   quit 30  years ago     Labs: Recent Review Flowsheet Data    Labs for ITP Cardiac and Pulmonary Rehab Latest Ref Rng & Units 11/18/2016 12/17/2016 05/20/2017 06/09/2017 12/20/2017   Cholestrol 100 - 199 mg/dL 112 104 - 106 134   LDLCALC 0 - 99 mg/dL - 39 - 42 44   HDL >39 mg/dL - 41 - 41 39(L)   Trlycerides 0 - 149 mg/dL 264(H) 122 - 114 257(H)   Hemoglobin A1c 4.8 - 5.6 % - - 5.6 - -       Exercise Target Goals: Exercise Program Goal: Individual exercise prescription set using results from initial 6 min walk test and THRR while considering  patient's activity barriers and safety.   Exercise Prescription Goal: Initial exercise prescription builds to 30-45 minutes a day of aerobic activity, 2-3 days per week.  Home exercise guidelines will be given to patient during program as part of exercise prescription that the participant will acknowledge.  Activity Barriers & Risk Stratification: Activity Barriers & Cardiac Risk Stratification - 06/06/18 1252      Activity Barriers & Cardiac Risk Stratification   Activity Barriers  Deconditioning;Muscular Weakness    Cardiac Risk Stratification  High       6 Minute Walk: 6 Minute Walk    Row Name 06/06/18 1348  6 Minute Walk   Phase  Initial     Distance  1520 feet     Walk Time  6 minutes     # of Rest Breaks  0     MPH  2.88     METS  2.91     RPE  11     VO2 Peak  10.18     Symptoms  No     Resting HR  74 bpm     Resting BP  108/64     Resting Oxygen Saturation   97 %     Exercise Oxygen Saturation  during 6 min walk  98 %     Max Ex. HR  115 bpm     Max Ex. BP  174/76     2 Minute Post BP  126/62        Oxygen Initial Assessment:   Oxygen Re-Evaluation:   Oxygen Discharge (Final Oxygen Re-Evaluation):   Initial Exercise Prescription: Initial Exercise Prescription - 06/06/18 1300      Date of Initial Exercise RX and Referring Provider   Date  06/06/18    Referring Provider  Serafina Royals MD      Treadmill    MPH  2.3    Grade  0.5    Minutes  15    METs  2.93      Elliptical   Level  1    Speed  3    Minutes  15      T5 Nustep   Level  3    SPM  80    Minutes  15    METs  3      Prescription Details   Frequency (times per week)  2    Duration  Progress to 30 minutes of continuous aerobic without signs/symptoms of physical distress      Intensity   THRR 40-80% of Max Heartrate  104-135    Ratings of Perceived Exertion  11-13    Perceived Dyspnea  0-4      Progression   Progression  Continue to progress workloads to maintain intensity without signs/symptoms of physical distress.      Resistance Training   Training Prescription  Yes    Weight  4 lbs    Reps  10-15       Perform Capillary Blood Glucose checks as needed.  Exercise Prescription Changes: Exercise Prescription Changes    Row Name 06/06/18 1200 06/14/18 1000           Response to Exercise   Blood Pressure (Admit)  108/64  136/60      Blood Pressure (Exercise)  174/76  140/62      Blood Pressure (Exit)  126/62  126/70      Heart Rate (Admit)  74 bpm  65 bpm      Heart Rate (Exercise)  115 bpm  95 bpm      Heart Rate (Exit)  85 bpm  64 bpm      Oxygen Saturation (Admit)  97 %  -      Oxygen Saturation (Exercise)  98 %  -      Rating of Perceived Exertion (Exercise)  11  10      Symptoms  none  none      Comments  walk test results  first full day of exercise      Duration  -  Continue with 30 min of aerobic exercise without signs/symptoms of physical distress.  Intensity  -  THRR unchanged        Progression   Progression  -  Continue to progress workloads to maintain intensity without signs/symptoms of physical distress.      Average METs  -  2.9        Resistance Training   Training Prescription  -  Yes      Weight  -  4 lbs      Reps  -  10-15        Interval Training   Interval Training  -  No        Elliptical   Level  -  1      Speed  -  3      Minutes  -  15        T5 Nustep    Level  -  3      Minutes  -  15      METs  -  2.9        Home Exercise Plan   Plans to continue exercise at  -  [employer] (comment) walking, [employer], firehouse gym      Frequency  -  Add 3 additional days to program exercise sessions.      Initial Home Exercises Provided  -  06/14/18         Exercise Comments: Exercise Comments    Row Name 06/14/18 [phone]           Exercise Comments  First full day of exercise!  Patient was oriented to gym and equipment including functions, settings, policies, and procedures.  Patient's individual exercise prescription and treatment plan were reviewed.  All starting workloads were established based on the results of the 6 minute walk test done at initial orientation visit.  The plan for exercise progression was also introduced and progression will be customized based on patient's performance and goals.          Exercise Goals and Review: Exercise Goals    Row Name 06/06/18 1351             Exercise Goals   Increase Physical Activity  Yes       Intervention  Provide advice, education, support and counseling about physical activity/exercise needs.;Develop an individualized exercise prescription for aerobic and resistive training based on initial evaluation findings, risk stratification, comorbidities and participant's personal goals.       Expected Outcomes  Short Term: Attend rehab on a regular basis to increase amount of physical activity.;Long Term: Add in home exercise to make exercise part of routine and to increase amount of physical activity.;Long Term: Exercising regularly at least 3-5 days a week.       Increase Strength and Stamina  Yes       Intervention  Provide advice, education, support and counseling about physical activity/exercise needs.;Develop an individualized exercise prescription for aerobic and resistive training based on initial evaluation findings, risk stratification, comorbidities and participant's personal  goals.       Expected Outcomes  Short Term: Perform resistance training exercises routinely during rehab and add in resistance training at home;Long Term: Improve cardiorespiratory fitness, muscular endurance and strength as measured by increased METs and functional capacity (6MWT);Short Term: Increase workloads from initial exercise prescription for resistance, speed, and METs.       Able to understand and use rate of perceived exertion (RPE) scale  Yes       Intervention  Provide education and explanation on  how to use RPE scale       Expected Outcomes  Short Term: Able to use RPE daily in rehab to express subjective intensity level;Long Term:  Able to use RPE to guide intensity level when exercising independently       Knowledge and understanding of Target Heart Rate Range (THRR)  Yes       Intervention  Provide education and explanation of THRR including how the numbers were predicted and where they are located for reference       Expected Outcomes  Short Term: Able to state/look up THRR;Long Term: Able to use THRR to govern intensity when exercising independently;Short Term: Able to use daily as guideline for intensity in rehab       Able to check pulse independently  Yes       Intervention  Provide education and demonstration on how to check pulse in carotid and radial arteries.;Review the importance of being able to check your own pulse for safety during independent exercise       Expected Outcomes  Short Term: Able to explain why pulse checking is important during independent exercise;Long Term: Able to check pulse independently and accurately       Understanding of Exercise Prescription  Yes       Intervention  Provide education, explanation, and written materials on patient's individual exercise prescription       Expected Outcomes  Short Term: Able to explain program exercise prescription;Long Term: Able to explain home exercise prescription to exercise independently          Exercise  Goals Re-Evaluation : Exercise Goals Re-Evaluation    Row Name 06/14/18 0917 06/14/18 1024           Exercise Goal Re-Evaluation   Exercise Goals Review  Increase Physical Activity;Increase Strength and Stamina;Able to understand and use rate of perceived exertion (RPE) scale;Knowledge and understanding of Target Heart Rate Range (THRR);Understanding of Exercise Prescription  Increase Physical Activity;Increase Strength and Stamina;Understanding of Exercise Prescription      Comments  Reviewed RPE scale, THR and program prescription with pt today.  Pt voiced understanding and was given a copy of goals to take home.   Reviewed home exercise guidelines with [name].  He will continue to go to [employer] and walk.  He also has access to gym at fire house.       Expected Outcomes  Short: Use RPE daily to regulate intensity. Long: Follow program prescription in THR.  Short: Continue to exericse more regularly.  Long: Continue to increase activity and stamina.          Discharge Exercise Prescription (Final Exercise Prescription Changes): Exercise Prescription Changes - 06/14/18 1000      Response to Exercise   Blood Pressure (Admit)  136/60    Blood Pressure (Exercise)  140/62    Blood Pressure (Exit)  126/70    Heart Rate (Admit)  65 bpm    Heart Rate (Exercise)  95 bpm    Heart Rate (Exit)  64 bpm    Rating of Perceived Exertion (Exercise)  10    Symptoms  none    Comments  first full day of exercise    Duration  Continue with 30 min of aerobic exercise without signs/symptoms of physical distress.    Intensity  THRR unchanged      Progression   Progression  Continue to progress workloads to maintain intensity without signs/symptoms of physical distress.    Average METs  2.9  Resistance Training   Training Prescription  Yes    Weight  4 lbs    Reps  10-15      Interval Training   Interval Training  No      Elliptical   Level  1    Speed  3    Minutes  15      T5  Nustep   Level  3    Minutes  15    METs  2.9      Home Exercise Plan   Plans to continue exercise at  [employer] (comment)   walking, [employer], firehouse gym   Frequency  Add 3 additional days to program exercise sessions.    Initial Home Exercises Provided  06/14/18       Nutrition:  Target Goals: Understanding of nutrition guidelines, daily intake of sodium <1520m, cholesterol <204m calories 30% from fat and 7% or less from saturated fats, daily to have 5 or more servings of fruits and vegetables.  Biometrics: Pre Biometrics - 06/06/18 1353      Pre Biometrics   Height  5' 8.1" (1.73 m)  (Pended)     Weight  222 lb 11.2 oz (101 kg)  (Pended)     Waist Circumference  42 inches  (Pended)     Hip Circumference  43 inches  (Pended)     Waist to Hip Ratio  0.98 %  (Pended)     BMI (Calculated)  33.75  (Pended)     Single Leg Stand  30 seconds  (Pended)         Nutrition Therapy Plan and Nutrition Goals: Nutrition Therapy & Goals - 06/14/18 1007      Nutrition Therapy   Diet  DASH    Drug/Food Interactions  Statins/Certain Fruits    Protein (specify units)  9oz    Fiber  30 grams    Whole Grain Foods  3 servings   chooses whole grains half of the time   Saturated Fats  16 max. grams    Fruits and Vegetables  5 servings/day   8 ideal; eats vegetables regularly but not fruits   Sodium  1500 grams      Personal Nutrition Goals   Nutrition Goal  Increase fruit intake by at least one additional serving per week    Personal Goal #2  Increase fluid intake by at least one additional 8oz glass per day. Try adding extra fluids around exercise times first    Comments  His wife has been helping to change his diet by cooking without salt, buying low sodium products and decreasing portion sizes at meal times. She provides vegetables and whole grain choices regularly. They do not eat out often but he does choose fried foods when eating out occasionally. They eat a  variety of protein sources and they do not fry foods at home. They ask for french fries without salt [facility] meals[facility]day; breakfast: cheerios with 2% milk, lunch: leftovers, sandwich, [employer], dinner: varies (soups, roast, chicken, shrimp, wheat pasta, brown rice, beef tips, salads, vegetables, baked potato). He drinks mostly water with the occasional sweet tea or diluted orange juice. He does not eat fruit regularly but does eat vegetables that his wife cooks      [place] and counsel regarding individualized specific dietary modifications aiming towards targeted core components such as weight, hypertension, lipid management, diabetes, heart failure and other comorbidities.  Expected Outcomes  Short Term Goal: Understand basic principles of dietary content, such as calories, fat, sodium, cholesterol and nutrients.;Short Term Goal: A plan has been developed with personal nutrition goals set during dietitian appointment.;Long Term Goal: Adherence to prescribed nutrition plan.       Nutrition Assessments: Nutrition Assessments - 06/06/18 1302      MEDFICTS Scores   Pre Score  42       Nutrition Goals Re-Evaluation: Nutrition Goals Re-Evaluation    [place] Name 06/14/18 1012             Goals   Nutrition Goal  Increase fluid intake by at least one additional 8oz glass per day. Try adding extra fluids around exercise times first       Comment  He drinks mostly zero sugar beverages but does not feel that he drinks enough each day       Expected Outcome  He will try to drink at least one more glass of water daily, ideally around exercise sessions         Personal Goal #2 Re-Evaluation   Personal Goal #2  Increase fruit intake by at least one additional serving per week          Nutrition Goals Discharge (Final Nutrition Goals Re-Evaluation): Nutrition Goals Re-Evaluation - 06/14/18 1012      Goals   Nutrition Goal   Increase fluid intake by at least one additional 8oz glass per day. Try adding extra fluids around exercise times first    Comment  He drinks mostly zero sugar beverages but does not feel that he drinks enough each day    Expected Outcome  He will try to drink at least one more glass of water daily, ideally around exercise sessions      Personal Goal #2 Re-Evaluation   Personal Goal #2  Increase fruit intake by at least one additional serving per week       Psychosocial: Target Goals: Acknowledge presence or absence of significant depression and/or stress, maximize coping skills, provide positive support system. Participant is able to verbalize types and ability to use techniques and skills needed for reducing stress and depression.   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 Jerry's wife is staying with her Mom every night. 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 "That is hard"  Sleeps to about 3 AM, sometimes takes time to get back to sleep until up for the day at 545       Quality of Life Scores:  Quality of Life - 06/06/18 1255      Quality of Life   Select  Quality of Life      Quality of Life Scores   Health/Function Pre  6.1 %    Socioeconomic Pre  26.69 %    Psych/Spiritual Pre  14.29 %    Family Pre  21.6 %    GLOBAL Pre  14.66 %      Scores of 19 and below usually indicate a poorer quality of life in these areas.  A difference of  2-3 points is a clinically meaningful difference.  A difference of 2-3 points in the total score of the Quality of Life Index has been associated with significant improvement in  overall quality of life, self-image, physical symptoms, and general health in studies assessing change in quality of  life.  PHQ-9: Recent Review Flowsheet Data    Depression screen [facility] 2/9 06/09/2018 06/06/2018 12/20/2017 12/16/2017 08/26/2017   Decreased Interest _0 Down, Depressed, Hopeless _1 PHQ - 2 Score _2 Altered sleeping _3 Tired, decreased energy _4 Change in appetite 0 0 0 0 0   Feeling bad or failure about yourself  _5 0   Trouble concentrating 0 0 0 0 0   Moving slowly or fidgety/restless 0 0 0 0 0   Suicidal thoughts 0 0 0 0 0   PHQ-9 Score _6 Difficult doing work/chores - Somewhat difficult - Not difficult at all Not difficult at all     Interpretation of Total Score  Total Score Depression Severity:  1-4 = Minimal depression, 5-9 = Mild depression, 10-14 = Moderate depression, 15-19 = Moderately severe depression, 20-27 = Severe depression   Psychosocial Evaluation and Intervention: Psychosocial Evaluation - 06/14/18 1025      Psychosocial Evaluation & Interventions   Interventions  Stress management education;Encouraged to exercise with the program and follow exercise prescription    Comments  Mr. Lantigua Ronald Pippins) has returned to 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  Counselor met with him today for initial psychosocial evaluation.   He is a 72 year old who has a family history of heart disease.  He has a strong support system with a spouse of 11 years; lots of family locally and active involvement with his local church.  Ronald Pippins reports only getting maybe ~5 hours/night sleep.  He has a history of OSA but is no longer using the CPAP once he lost weight.  He has a good appetite.  Ronald Pippins denies a history of depression or anxiety or any current symptoms and states he is typically a positive person.  He has multiple stressors currently with his own health; his 21 year old mother-in-law is being cared for by Gerry's spouse; and Ronald Pippins is the [place] which can be stressful at times as well.   He has goals to feel better  overall; to lose some weight and increase his stamina and strength.  Staff will follow with Ronald Pippins.    Expected Outcomes  Short:  Ronald Pippins will develop some positive coping strategies by attending the educational components of this program and learn to manage his stress better.  He will also meet with the dietician to address his weight loss goals.  Ronald Pippins will benefit from exercising consistently for his health and mental health and as a coping strategy.  Long:  Ronald Pippins will develop positive self-care habits from this program for his continued health and mental health.      Continue Psychosocial Services   Follow up required by staff       Psychosocial Re-Evaluation:   Psychosocial Discharge (Final Psychosocial Re-Evaluation):   Vocational Rehabilitation: Provide vocational rehab assistance to qualifying candidates.   Vocational Rehab Evaluation & Intervention: Vocational Rehab - 06/06/18 1304      Initial Vocational Rehab Evaluation & Intervention   Assessment shows need for Vocational Rehabilitation  No       Education: Education Goals: Education classes will be provided on a variety of topics geared toward better understanding of  heart health and risk factor modification. Participant will state understanding/return demonstration of topics presented as noted by education test scores.  Learning Barriers/Preferences: Learning Barriers/Preferences - 06/06/18 1303      Learning Barriers/Preferences   Learning Barriers  None    Learning Preferences  None       Education Topics:  AED/CPR: - Group verbal and written instruction with the use of models to demonstrate the basic use of the AED with the basic ABC's of resuscitation.   General Nutrition Guidelines/Fats and Fiber: -Group instruction provided by verbal, written material, models and posters to present the general guidelines for heart healthy nutrition. Gives an explanation and review of dietary fats and fiber.   Cardiac Rehab  from 01/04/2018 in [facility] Cardiac and Pulmonary Rehab  Date  11/09/17  Educator  PI  Instruction Review Code  1- Verbalizes Understanding      Controlling Sodium/Reading Food Labels: -Group verbal and written material supporting the discussion of sodium use in heart healthy nutrition. Review and explanation with models, verbal and written materials for utilization of the food label.   Cardiac Rehab from 01/04/2018 in [facility] Cardiac and Pulmonary Rehab  Date  01/04/18  Educator  PI  Instruction Review Code  1- Verbalizes Understanding      Exercise Physiology & General Exercise Guidelines: - Group verbal and written instruction with models to review the exercise physiology of the cardiovascular system and associated critical values. Provides general exercise guidelines with specific guidelines to those with heart or lung disease.    Cardiac Rehab from 01/04/2018 in [facility] Cardiac and Pulmonary Rehab  Date  11/25/17  Educator  [facility]  Instruction Review Code  1- Verbalizes Understanding      Aerobic Exercise & Resistance Training: - Gives group verbal and written instruction on the various components of exercise. Focuses on aerobic and resistive training programs and the benefits of this training and how to safely progress through these programs..   Flexibility, Balance, Mind/Body Relaxation: Provides group verbal/written instruction on the benefits of flexibility and balance training, including mind/body exercise modes such as yoga, pilates and tai chi.  Demonstration and skill practice provided.   Cardiac Rehab from 01/04/2018 in [facility] Cardiac and Pulmonary Rehab  Date  10/14/17  Educator  AS  Instruction Review Code  1- Verbalizes Understanding      Stress and Anxiety: - Provides group verbal and written instruction about the health risks of elevated stress and causes of high stress.  Discuss the correlation between heart/lung disease and anxiety and treatment options. Review healthy ways to  manage with stress and anxiety.   Cardiac Rehab from 01/04/2018 in [facility] Cardiac and Pulmonary Rehab  Date  12/14/17  Educator  [facility]  Instruction Review Code  1- Verbalizes Understanding      Depression: - Provides group verbal and written instruction on the correlation between heart/lung disease and depressed mood, treatment options, and the stigmas associated with seeking treatment.   Anatomy & Physiology of the Heart: - Group verbal and written instruction and models provide basic cardiac anatomy and physiology, with the coronary electrical and arterial systems. Review of Valvular disease and Heart Failure   Cardiac Rehab from [facility]04/2018 [facility] Cardiac and Pulmonary Rehab  Date  12/16/17  Educator  CE  Instruction Review Code  1- Verbalizes Understanding      Cardiac Procedures: - Group verbal and written instruction to review commonly prescribed medications for heart disease. Reviews the medication, class of the drug, and side effects.  Includes the steps to properly store meds and maintain the prescription regimen. (beta blockers and nitrates)   Cardiac Rehab from 06/14/2018 in [facility] Cardiac and Pulmonary Rehab  Date  06/14/18  Educator  [place]  Instruction Review Code  1- Verbalizes Understanding      Cardiac Medications I: - Group verbal and written instruction to review commonly prescribed medications for heart disease. Reviews the medication, class of the drug, and side effects. Includes the steps to properly store meds and maintain the prescription regimen.   Cardiac Rehab from 01/04/2018 in [facility] Cardiac and Pulmonary Rehab  Date  12/21/17  Educator  SB  Instruction Review Code  1- Verbalizes Understanding      Cardiac Medications II: -Group verbal and written instruction to review commonly prescribed medications for heart disease. Reviews the medication, class of the drug, and side effects. (all other drug classes)   Cardiac Rehab from 01/04/2018 in [facility] Cardiac and Pulmonary  Rehab  Date  10/21/17  Educator  [facility]  Instruction Review Code  1- Verbalizes Understanding       Go Sex-Intimacy & Heart Disease, Get SMART - Goal Setting: - Group verbal and written instruction through game format to discuss heart disease and the return to sexual intimacy. Provides group verbal and written material to discuss and apply goal setting through the application of the S.M.A.R.T. Method.   Cardiac Rehab from 06/14/2018 in [facility] Cardiac and Pulmonary Rehab  Date  06/14/18  Educator  [place]  Instruction Review Code  1- Verbalizes Understanding      Other Matters of the Heart: - Provides group verbal, written materials and models to describe Stable Angina and Peripheral Artery. Includes description of the disease process and treatment options available to the cardiac patient.   Cardiac Rehab from 01/04/2018 in [facility] Cardiac and Pulmonary Rehab  Date  12/16/17  Educator  CE  Instruction Review Code  1- Verbalizes Understanding      Exercise & Equipment Safety: - Individual verbal instruction and demonstration of equipment use and safety with use of the equipment.   Cardiac Rehab from 06/14/2018 in [facility] Cardiac and Pulmonary Rehab  Date  06/06/18  Educator  Sn  Instruction Review Code  1- Verbalizes Understanding      Infection Prevention: - Provides verbal and written material to individual with discussion of infection control including proper hand washing and proper equipment cleaning during exercise session.   Cardiac Rehab from 06/14/2018 in [facility] Cardiac and Pulmonary Rehab  Date  06/06/18  Educator  Sb  Instruction Review Code  1- Verbalizes Understanding      Falls Prevention: - Provides verbal and written material to individual with discussion of falls prevention and safety.   Cardiac Rehab from 06/14/2018 in [facility] Cardiac and Pulmonary Rehab  Date  06/06/18  Educator  Sb  Instruction Review Code  1- Verbalizes Understanding      Diabetes: - Individual verbal  and written instruction to review signs/symptoms of diabetes, desired ranges of glucose level fasting, after meals and with exercise. Acknowledge that pre and post exercise glucose checks will be done for 3 sessions at entry of program.   Know Your Numbers and Risk Factors: -Group verbal and written instruction about important numbers in your health.  Discussion of what are risk factors and how they play a role in the disease process.  Review of Cholesterol, Blood Pressure, Diabetes, and BMI and the role they play in your overall health.   Cardiac Rehab from 01/04/2018 in [facility]  Cardiac and Pulmonary Rehab  Date  10/21/17  Educator  [facility]  Instruction Review Code  1- Verbalizes Understanding      Sleep Hygiene: -Provides group verbal and written instruction about how sleep can affect your health.  Define sleep hygiene, discuss sleep cycles and impact of sleep habits. Review good sleep hygiene tips.    Cardiac Rehab from 01/04/2018 in [facility] Cardiac and Pulmonary Rehab  Date  11/02/17  Educator  [facility]  Instruction Review Code  1- Verbalizes Understanding      Other: -Provides group and verbal instruction on various topics (see comments)   Knowledge Questionnaire Score: Knowledge Questionnaire Score - 06/06/18 1303      Knowledge Questionnaire Score   Pre Score  24/26   reviewd correct responses with [name] todauy. He verbalized understanding of the responses and had no further questions.       Core Components/Risk Factors/Patient Goals at Admission: Personal Goals and Risk Factors at Admission - 06/06/18 1305      Core Components/Risk Factors/Patient Goals on Admission    Weight Management  Yes;Obesity;Weight Loss    Admit Weight  214 lb (97.1 kg)    Goal Weight: Short Term  212 lb (96.2 kg)    Goal Weight: Long Term  190 lb (86.2 kg)    Expected Outcomes  Short Term: Continue to assess and modify interventions until short term weight is achieved;Long Term: Adherence to nutrition and  physical activity/exercise program aimed toward attainment of established weight goal;Weight Loss: Understanding of general recommendations for a balanced deficit meal plan, which promotes 1-2 lb weight loss per week and includes a negative energy balance of [phone] kcal/d    Hypertension  Yes    Intervention  Provide education on lifestyle modifcations including regular physical activity/exercise, weight management, moderate sodium restriction and increased consumption of fresh fruit, vegetables, and low fat dairy, alcohol moderation, and smoking cessation.;Monitor prescription use compliance.    Expected Outcomes  Short Term: Continued assessment and intervention until BP is < 140/58m HG in hypertensive participants. < 130/826mHG in hypertensive participants with diabetes, heart failure or chronic kidney disease.;Long Term: Maintenance of blood pressure at goal levels.    Lipids  Yes    Intervention  Provide education and support for participant on nutrition & aerobic/resistive exercise along with prescribed medications to achieve LDL <7026mHDL >49m41m  Expected Outcomes  Short Term: Participant states understanding of desired cholesterol values and is compliant with medications prescribed. Participant is following exercise prescription and nutrition guidelines.;Long Term: Cholesterol controlled with medications as prescribed, with individualized exercise RX and with personalized nutrition plan. Value goals: LDL < 70mg49mL > 40 mg.       Core Components/Risk Factors/Patient Goals Review:    Core Components/Risk Factors/Patient Goals at Discharge (Final Review):    ITP Comments: ITP Comments    Row Name 06/06/18 1240 06/22/18 0559         ITP Comments  Medical review completed today. New diagnosis new program start.  ITP sent to Dr MilleSabra Heckreview, changes as needed and signature. Documentation of diagnosis can be found in [facility]  30 day review. Continue with ITP unless  direccted changes per Medical Director Chart Review. New to program         Comments:

## 2018-06-28 ENCOUNTER — Encounter: Payer: Medicare Other | Admitting: *Deleted

## 2018-06-28 DIAGNOSIS — Z7901 Long term (current) use of anticoagulants: Secondary | ICD-10-CM | POA: Diagnosis not present

## 2018-06-28 DIAGNOSIS — Z7982 Long term (current) use of aspirin: Secondary | ICD-10-CM | POA: Diagnosis not present

## 2018-06-28 DIAGNOSIS — Z7902 Long term (current) use of antithrombotics/antiplatelets: Secondary | ICD-10-CM | POA: Diagnosis not present

## 2018-06-28 DIAGNOSIS — Z951 Presence of aortocoronary bypass graft: Secondary | ICD-10-CM

## 2018-06-28 DIAGNOSIS — I2510 Atherosclerotic heart disease of native coronary artery without angina pectoris: Secondary | ICD-10-CM | POA: Diagnosis not present

## 2018-06-28 DIAGNOSIS — Z79899 Other long term (current) drug therapy: Secondary | ICD-10-CM | POA: Diagnosis not present

## 2018-06-28 NOTE — Progress Notes (Signed)
Daily Session Note  Patient Details  Name: Jakeel R Goecke MRN: [id number] Date of Birth: 09/26/1945 Referring Provider:     Cardiac Rehab from 06/06/2018 in [facility] Cardiac and Pulmonary Rehab  Referring Provider  Kowalski, Bruce MD      Encounter Date: 06/28/2018  Check In: Session Check In - 06/28/18 0815      Check-In   Supervising physician immediately available to respond to emergencies  See telemetry face sheet for immediately available ER MD    Location  [facility]-Cardiac & Pulmonary Rehab    Staff Present   , MA, RCEP, CCRP, Exercise Physiologist;Amanda Sommer, BA, ACSM CEP, Exercise Physiologist;Other;Susanne Bice, RN, BSN, CCRP   [name]   Medication changes reported      No    Fall or balance concerns reported     No    Warm-up and Cool-down  Performed on first and last piece of equipment    Resistance Training Performed  Yes    VAD Patient?  No    PAD/SET Patient?  No      Pain Assessment   Currently in Pain?  No/denies          Social History   Tobacco Use  Smoking Status Former Smoker  . Types: Cigarettes  . Last attempt to quit: 1989  . Years since quitting: 30.8  Smokeless Tobacco Never Used  Tobacco Comment   quit 30 years ago     Goals Met:  Independence with exercise equipment Exercise tolerated well No report of cardiac concerns or symptoms Strength training completed today  Goals Unmet:  Not Applicable  Comments: Pt able to follow exercise prescription today without complaint.  Will continue to monitor for progression. Jerry's blood pressure was elevated when he came in this morning (200/102).  He sat for a while and was able to come down to the 170s.  He did the T5 and treadmill today without any problems.    Dr. Mark Miller is Medical Director for [facility] and [facility]. 

## 2018-06-30 DIAGNOSIS — Z951 Presence of aortocoronary bypass graft: Secondary | ICD-10-CM | POA: Diagnosis not present

## 2018-06-30 DIAGNOSIS — Z7901 Long term (current) use of anticoagulants: Secondary | ICD-10-CM | POA: Diagnosis not present

## 2018-06-30 DIAGNOSIS — Z7982 Long term (current) use of aspirin: Secondary | ICD-10-CM | POA: Diagnosis not present

## 2018-06-30 DIAGNOSIS — Z7902 Long term (current) use of antithrombotics/antiplatelets: Secondary | ICD-10-CM | POA: Diagnosis not present

## 2018-06-30 DIAGNOSIS — I2510 Atherosclerotic heart disease of native coronary artery without angina pectoris: Secondary | ICD-10-CM | POA: Diagnosis not present

## 2018-06-30 DIAGNOSIS — Z79899 Other long term (current) drug therapy: Secondary | ICD-10-CM | POA: Diagnosis not present

## 2018-06-30 DIAGNOSIS — Z955 Presence of coronary angioplasty implant and graft: Secondary | ICD-10-CM

## 2018-06-30 NOTE — Progress Notes (Signed)
Daily Session Note  Patient Details  Name: Darrell [name] MRN: [id number] Date of Birth: 10-Jan-1946 Referring Provider:     Cardiac Rehab from 06/06/2018 in [facility] Cardiac and Pulmonary Rehab  Referring Provider  Serafina Royals MD      Encounter Date: 06/30/2018  Check In: Session Check In - 06/30/18 0829      Check-In   Supervising physician immediately available to respond to emergencies  See telemetry face sheet for immediately available ER MD    Location  [facility]-Cardiac & Pulmonary Rehab    Staff Present  Gerlene Burdock, RN, BSN;Jessica Luan Pulling, MA, RCEP, CCRP, Exercise Physiologist;Kenyanna Grzesiak Oletta Darter, BA, ACSM CEP, Exercise Physiologist    Medication changes reported      No    Fall or balance concerns reported     No    Warm-up and Cool-down  Performed on first and last piece of equipment    Resistance Training Performed  Yes    VAD Patient?  No    PAD/SET Patient?  No      Pain Assessment   Currently in Pain?  No/denies    Multiple Pain Sites  No          Social History   Tobacco Use  Smoking Status Former Smoker  . Types: Cigarettes  . Last attempt to quit: 1989  . Years since quitting: 30.8  Smokeless Tobacco Never Used  Tobacco Comment   quit 30 years ago     Goals Met:  Independence with exercise equipment Exercise tolerated well No report of cardiac concerns or symptoms Strength training completed today  Goals Unmet:  Not Applicable  Comments: Pt able to follow exercise prescription today without complaint.  Will continue to monitor for progression.    Dr. Emily Filbert is Medical Director for [facility] and [facility].

## 2018-07-05 DIAGNOSIS — Z951 Presence of aortocoronary bypass graft: Secondary | ICD-10-CM | POA: Diagnosis not present

## 2018-07-05 DIAGNOSIS — I2510 Atherosclerotic heart disease of native coronary artery without angina pectoris: Secondary | ICD-10-CM | POA: Diagnosis not present

## 2018-07-05 DIAGNOSIS — Z7902 Long term (current) use of antithrombotics/antiplatelets: Secondary | ICD-10-CM | POA: Diagnosis not present

## 2018-07-05 DIAGNOSIS — Z955 Presence of coronary angioplasty implant and graft: Secondary | ICD-10-CM

## 2018-07-05 DIAGNOSIS — Z7901 Long term (current) use of anticoagulants: Secondary | ICD-10-CM | POA: Diagnosis not present

## 2018-07-05 DIAGNOSIS — Z7982 Long term (current) use of aspirin: Secondary | ICD-10-CM | POA: Diagnosis not present

## 2018-07-05 DIAGNOSIS — Z79899 Other long term (current) drug therapy: Secondary | ICD-10-CM | POA: Diagnosis not present

## 2018-07-05 NOTE — Progress Notes (Signed)
Daily Session Note  Patient Details  Name: Darrell [name] MRN: [id number] Date of Birth: 08/10/1946 Referring Provider:     Cardiac Rehab from 06/06/2018 in [facility] Cardiac and Pulmonary Rehab  Referring Provider  Kowalski, Bruce MD      Encounter Date: 07/05/2018  Check In: Session Check In - 07/05/18 1114      Check-In   Supervising physician immediately available to respond to emergencies  See telemetry face sheet for immediately available ER MD    Location  [facility]-Cardiac & Pulmonary Rehab    Staff Present  Jessica Hawkins, MA, RCEP, CCRP, Exercise Physiologist;Susanne Bice, RN, BSN, CCRP;[name] BS, Exercise Physiologist    Medication changes reported      No    Fall or balance concerns reported     No    Warm-up and Cool-down  Performed on first and last piece of equipment    Resistance Training Performed  Yes    VAD Patient?  No    PAD/SET Patient?  No      Pain Assessment   Currently in Pain?  No/denies    Multiple Pain Sites  No          Social History   Tobacco Use  Smoking Status Former Smoker  . Types: Cigarettes  . Last attempt to quit: 1989  . Years since quitting: 30.9  Smokeless Tobacco Never Used  Tobacco Comment   quit 30 years ago     Goals Met:  Independence with exercise equipment Exercise tolerated well No report of cardiac concerns or symptoms Strength training completed today  Goals Unmet:  Not Applicable  Comments: Pt able to follow exercise prescription today without complaint.  Will continue to monitor for progression.   Dr. Mark Miller is Medical Director for [facility] and [facility]. 

## 2018-07-08 DIAGNOSIS — Z79899 Other long term (current) drug therapy: Secondary | ICD-10-CM | POA: Diagnosis not present

## 2018-07-08 DIAGNOSIS — I2510 Atherosclerotic heart disease of native coronary artery without angina pectoris: Secondary | ICD-10-CM | POA: Diagnosis not present

## 2018-07-08 DIAGNOSIS — Z955 Presence of coronary angioplasty implant and graft: Secondary | ICD-10-CM

## 2018-07-08 DIAGNOSIS — Z951 Presence of aortocoronary bypass graft: Secondary | ICD-10-CM

## 2018-07-08 DIAGNOSIS — Z7982 Long term (current) use of aspirin: Secondary | ICD-10-CM | POA: Diagnosis not present

## 2018-07-08 DIAGNOSIS — Z7901 Long term (current) use of anticoagulants: Secondary | ICD-10-CM | POA: Diagnosis not present

## 2018-07-08 DIAGNOSIS — Z7902 Long term (current) use of antithrombotics/antiplatelets: Secondary | ICD-10-CM | POA: Diagnosis not present

## 2018-07-08 NOTE — Progress Notes (Signed)
Daily Session Note  Patient Details  Name: Darrell [name] MRN: [id number] Date of Birth: [birth year] Referring Provider:     Cardiac Rehab from 06/06/2018 in [facility] Cardiac and Pulmonary Rehab  Referring Provider  Serafina Royals MD      Encounter Date: 07/08/2018  Check In:      Social History   Tobacco Use  Smoking Status Former Smoker  . Types: Cigarettes  . Last attempt to quit: 1989  . Years since quitting: 30.9  Smokeless Tobacco Never Used  Tobacco Comment   quit 30 years ago     Goals Met:  Independence with exercise equipment Exercise tolerated well No report of cardiac concerns or symptoms Strength training completed today  Goals Unmet:  Not Applicable  Comments: Pt able to follow exercise prescription today without complaint.  Will continue to monitor for progression.    Dr. Emily Filbert is Medical Director for [facility] and [facility].

## 2018-07-12 ENCOUNTER — Encounter: Payer: Medicare Other | Admitting: *Deleted

## 2018-07-12 DIAGNOSIS — Z7901 Long term (current) use of anticoagulants: Secondary | ICD-10-CM | POA: Diagnosis not present

## 2018-07-12 DIAGNOSIS — I2510 Atherosclerotic heart disease of native coronary artery without angina pectoris: Secondary | ICD-10-CM | POA: Diagnosis not present

## 2018-07-12 DIAGNOSIS — Z7982 Long term (current) use of aspirin: Secondary | ICD-10-CM | POA: Diagnosis not present

## 2018-07-12 DIAGNOSIS — Z7902 Long term (current) use of antithrombotics/antiplatelets: Secondary | ICD-10-CM | POA: Diagnosis not present

## 2018-07-12 DIAGNOSIS — Z79899 Other long term (current) drug therapy: Secondary | ICD-10-CM | POA: Diagnosis not present

## 2018-07-12 DIAGNOSIS — Z951 Presence of aortocoronary bypass graft: Secondary | ICD-10-CM

## 2018-07-12 NOTE — Progress Notes (Signed)
Daily Session Note  Patient Details  Name: Darrell [name] MRN: [id number] Date of Birth: 05-20-1946 Referring Provider:     Cardiac Rehab from 06/06/2018 in [facility] Cardiac and Pulmonary Rehab  Referring Provider  Serafina Royals MD      Encounter Date: 07/12/2018  Check In: Session Check In - 07/12/18 0809      Check-In   Supervising physician immediately available to respond to emergencies  See telemetry face sheet for immediately available ER MD    Location  [facility]-Cardiac & Pulmonary Rehab    Staff Present  Heath Lark, RN, BSN, CCRP;Nasira Janusz [place], MA, RCEP, CCRP, Exercise Physiologist    Medication changes reported      No    Fall or balance concerns reported     No    Warm-up and Cool-down  Performed as group-led instruction    Resistance Training Performed  Yes    VAD Patient?  No    PAD/SET Patient?  No      Pain Assessment   Currently in Pain?  No/denies          Social History   Tobacco Use  Smoking Status Former Smoker  . Types: Cigarettes  . Last attempt to quit: 1989  . Years since quitting: 30.9  Smokeless Tobacco Never Used  Tobacco Comment   quit 30 years ago     Goals Met:  Independence with exercise equipment Exercise tolerated well No report of cardiac concerns or symptoms Strength training completed today  Goals Unmet:  Not Applicable  Comments: Pt able to follow exercise prescription today without complaint.  Will continue to monitor for progression.    Dr. Emily Filbert is Medical Director for [place] and [facility].

## 2018-07-19 ENCOUNTER — Encounter: Payer: Medicare Other | Attending: Internal Medicine | Admitting: *Deleted

## 2018-07-19 DIAGNOSIS — Z7901 Long term (current) use of anticoagulants: Secondary | ICD-10-CM | POA: Insufficient documentation

## 2018-07-19 DIAGNOSIS — I2510 Atherosclerotic heart disease of native coronary artery without angina pectoris: Secondary | ICD-10-CM | POA: Insufficient documentation

## 2018-07-19 DIAGNOSIS — Z79899 Other long term (current) drug therapy: Secondary | ICD-10-CM | POA: Insufficient documentation

## 2018-07-19 DIAGNOSIS — I10 Essential (primary) hypertension: Secondary | ICD-10-CM | POA: Insufficient documentation

## 2018-07-19 DIAGNOSIS — I629 Nontraumatic intracranial hemorrhage, unspecified: Secondary | ICD-10-CM | POA: Insufficient documentation

## 2018-07-19 DIAGNOSIS — L409 Psoriasis, unspecified: Secondary | ICD-10-CM | POA: Insufficient documentation

## 2018-07-19 DIAGNOSIS — Z7982 Long term (current) use of aspirin: Secondary | ICD-10-CM | POA: Diagnosis not present

## 2018-07-19 DIAGNOSIS — Z87891 Personal history of nicotine dependence: Secondary | ICD-10-CM | POA: Insufficient documentation

## 2018-07-19 DIAGNOSIS — Z7902 Long term (current) use of antithrombotics/antiplatelets: Secondary | ICD-10-CM | POA: Insufficient documentation

## 2018-07-19 DIAGNOSIS — E785 Hyperlipidemia, unspecified: Secondary | ICD-10-CM | POA: Diagnosis not present

## 2018-07-19 DIAGNOSIS — Z8546 Personal history of malignant neoplasm of prostate: Secondary | ICD-10-CM | POA: Insufficient documentation

## 2018-07-19 DIAGNOSIS — Z951 Presence of aortocoronary bypass graft: Secondary | ICD-10-CM | POA: Diagnosis not present

## 2018-07-19 NOTE — Progress Notes (Signed)
Daily Session Note  Patient Details  Name: Darrell [name] MRN: [id number] Date of Birth: 01-21-1946 Referring Provider:     Cardiac Rehab from 06/06/2018 in [facility] Cardiac and Pulmonary Rehab  Referring Provider  Serafina Royals MD      Encounter Date: 07/19/2018  Check In: Session Check In - 07/19/18 0830      Check-In   Supervising physician immediately available to respond to emergencies  See telemetry face sheet for immediately available ER MD    Location  [facility]-Cardiac & Pulmonary Rehab    Staff Present  Jasper Loser BS, Exercise Physiologist;Margaretann Abate Luan Pulling, MA, RCEP, CCRP, Exercise Physiologist;Susanne Bice, RN, BSN, CCRP    Medication changes reported      No    Fall or balance concerns reported     No    Warm-up and Cool-down  Performed as group-led instruction    Resistance Training Performed  Yes    VAD Patient?  No    PAD/SET Patient?  No      Pain Assessment   Currently in Pain?  No/denies          Social History   Tobacco Use  Smoking Status Former Smoker  . Types: Cigarettes  . Last attempt to quit: 1989  . Years since quitting: 30.9  Smokeless Tobacco Never Used  Tobacco Comment   quit 30 years ago     Goals Met:  Independence with exercise equipment Exercise tolerated well No report of cardiac concerns or symptoms Strength training completed today  Goals Unmet:  Not Applicable  Comments: Pt able to follow exercise prescription today without complaint.  Will continue to monitor for progression.    Dr. Emily Filbert is Medical Director for [place] and [facility].

## 2018-07-20 ENCOUNTER — Encounter: Payer: Self-pay | Admitting: *Deleted

## 2018-07-20 DIAGNOSIS — Z951 Presence of aortocoronary bypass graft: Secondary | ICD-10-CM

## 2018-07-20 NOTE — Progress Notes (Signed)
Cardiac Individual Treatment Plan  Patient Details  Name: Darrell [name] MRN: [id number] Date of Birth: July 21, 1946 Referring Provider:     Cardiac Rehab from 06/06/2018 in [facility] Cardiac and Pulmonary Rehab  Referring Provider  Serafina Royals MD      Initial Encounter Date:    Cardiac Rehab from 06/06/2018 in [facility]  Date  06/06/18      Visit Diagnosis: S/P CABG x 2  Patient's Home Medications on Admission:  Current Outpatient Medications:  .  allopurinol (ZYLOPRIM) 300 MG tablet, Take 0.5 tablets (150 mg total) by mouth daily., Disp: 90 tablet, Rfl: 4 .  apixaban (ELIQUIS) 5 MG TABS tablet, Take by mouth., Disp: , Rfl:  .  aspirin EC 81 MG tablet, Take by mouth., Disp: , Rfl:  .  atorvastatin (LIPITOR) 80 MG tablet, Take 80 mg by mouth daily., Disp: , Rfl:  .  carvedilol (COREG) 6.25 MG tablet, Take by mouth., Disp: , Rfl:  .  clopidogrel (PLAVIX) 75 MG tablet, Take 75 mg by mouth daily., Disp: , Rfl:  .  hydrochlorothiazide (HYDRODIURIL) 25 MG tablet, Take 1 tablet (25 mg total) by mouth daily. (Patient not taking: Reported on 06/06/2018), Disp: 90 tablet, Rfl: 4 .  isosorbide mononitrate (IMDUR) 30 MG 24 hr tablet, Take by mouth., Disp: , Rfl:  .  nitroGLYCERIN (NITROSTAT) 0.4 MG SL tablet, Place 1 tablet (0.4 mg total) under the tongue every 5 (five) minutes as needed for chest pain. (Patient not taking: Reported on 06/06/2018), Disp: 25 tablet, Rfl: 1 .  telmisartan (MICARDIS) 80 MG tablet, Take by mouth., Disp: , Rfl:   Past Medical History: Past Medical History:  Diagnosis Date  . CAD (coronary artery disease)   . Cancer [facility])    prostate  . Carotid artery stenosis   . Hyperlipidemia   . Hypertension   . Psoriasis     Tobacco Use: Social History   Tobacco Use  Smoking Status Former Smoker  . Types: Cigarettes  . Last attempt to quit: 1989  . Years since quitting: 30.9  Smokeless Tobacco Never Used  Tobacco Comment   quit 30  years ago     Labs: Recent Review Flowsheet Data    Labs for ITP Cardiac and Pulmonary Rehab Latest Ref Rng & Units 11/18/2016 12/17/2016 05/20/2017 06/09/2017 12/20/2017   Cholestrol 100 - 199 mg/dL 112 104 - 106 134   LDLCALC 0 - 99 mg/dL - 39 - 42 44   HDL >39 mg/dL - 41 - 41 39(L)   Trlycerides 0 - 149 mg/dL 264(H) 122 - 114 257(H)   Hemoglobin A1c 4.8 - 5.6 % - - 5.6 - -       Exercise Target Goals: Exercise Program Goal: Individual exercise prescription set using results from initial 6 min walk test and THRR while considering  patient's activity barriers and safety.   Exercise Prescription Goal: Initial exercise prescription builds to 30-45 minutes a day of aerobic activity, 2-3 days per week.  Home exercise guidelines will be given to patient during program as part of exercise prescription that the participant will acknowledge.  Activity Barriers & Risk Stratification: Activity Barriers & Cardiac Risk Stratification - 06/06/18 1252      Activity Barriers & Cardiac Risk Stratification   Activity Barriers  Deconditioning;Muscular Weakness    Cardiac Risk Stratification  High       6 Minute Walk: 6 Minute Walk    Row Name 06/06/18 1348  6 Minute Walk   Phase  Initial     Distance  1520 feet     Walk Time  6 minutes     # of Rest Breaks  0     MPH  2.88     METS  2.91     RPE  11     VO2 Peak  10.18     Symptoms  No     Resting HR  74 bpm     Resting BP  108/64     Resting Oxygen Saturation   97 %     Exercise Oxygen Saturation  during 6 min walk  98 %     Max Ex. HR  115 bpm     Max Ex. BP  174/76     2 Minute Post BP  126/62        Oxygen Initial Assessment:   Oxygen Re-Evaluation:   Oxygen Discharge (Final Oxygen Re-Evaluation):   Initial Exercise Prescription: Initial Exercise Prescription - 06/06/18 1300      Date of Initial Exercise RX and Referring Provider   Date  06/06/18    Referring Provider  Serafina Royals MD      Treadmill    MPH  2.3    Grade  0.5    Minutes  15    METs  2.93      Elliptical   Level  1    Speed  3    Minutes  15      T5 Nustep   Level  3    SPM  80    Minutes  15    METs  3      Prescription Details   Frequency (times per week)  2    Duration  Progress to 30 minutes of continuous aerobic without signs/symptoms of physical distress      Intensity   THRR 40-80% of Max Heartrate  104-135    Ratings of Perceived Exertion  11-13    Perceived Dyspnea  0-4      Progression   Progression  Continue to progress workloads to maintain intensity without signs/symptoms of physical distress.      Resistance Training   Training Prescription  Yes    Weight  4 lbs    Reps  10-15       Perform Capillary Blood Glucose checks as needed.  Exercise Prescription Changes: Exercise Prescription Changes    Row Name 06/06/18 1200 06/14/18 1000 06/28/18 1600 07/11/18 1600       Response to Exercise   Blood Pressure (Admit)  108/64  136/60  200/100 164/84  138/78    Blood Pressure (Exercise)  174/76  140/62  170/78  132/70    Blood Pressure (Exit)  126/62  126/70  146/80  126/70    Heart Rate (Admit)  74 bpm  65 bpm  48 bpm  72 bpm    Heart Rate (Exercise)  115 bpm  95 bpm  110 bpm  103 bpm    Heart Rate (Exit)  85 bpm  64 bpm  85 bpm  78 bpm    Oxygen Saturation (Admit)  97 %  -  -  -    Oxygen Saturation (Exercise)  98 %  -  -  -    Rating of Perceived Exertion (Exercise)  _0 Symptoms  none  none  none  none    Comments  walk test results  first full day of exercise  second full day of exercise  -    Duration  -  Continue with 30 min of aerobic exercise without signs/symptoms of physical distress.  Continue with 30 min of aerobic exercise without signs/symptoms of physical distress.  Continue with 30 min of aerobic exercise without signs/symptoms of physical distress.    Intensity  -  THRR unchanged  THRR unchanged  THRR unchanged      Progression   Progression  -  Continue  to progress workloads to maintain intensity without signs/symptoms of physical distress.  Continue to progress workloads to maintain intensity without signs/symptoms of physical distress.  Continue to progress workloads to maintain intensity without signs/symptoms of physical distress.    Average METs  -  2.9  2.96  3.66      Resistance Training   Training Prescription  -  Yes  Yes  Yes    Weight  -  4 lbs  4 lbs  4 lbs    Reps  -  10-15  10-15  10-15      Interval Training   Interval Training  -  No  No  No      Treadmill   MPH  -  -  2.8  2.8    Grade  -  -  2  2    Minutes  -  -  15  15    METs  -  -  3.92  3.92      Elliptical   Level  -  _0 Speed  -  _1 Minutes  -  _2 T5 Nustep   Level  -  _3 Minutes  -  _4 METs  -  2.9  2  3.4      Home Exercise Plan   Plans to continue exercise at  -  [employer] (comment) walking, [employer], [place] (comment) walking, [employer], [place] (comment) walking, [employer], firehouse gym    Frequency  -  Add 3 additional days to program exercise sessions.  Add 3 additional days to program exercise sessions.  Add 3 additional days to program exercise sessions.    Initial Home Exercises Provided  -  06/14/18  06/14/18  06/14/18       Exercise Comments: Exercise Comments    Row Name 06/14/18 [phone]           Exercise Comments  First full day of exercise!  Patient was oriented to gym and equipment including functions, settings, policies, and procedures.  Patient's individual exercise prescription and treatment plan were reviewed.  All starting workloads were established based on the results of the 6 minute walk test done at initial orientation visit.  The plan for exercise progression was also introduced and progression will be customized based on patient's performance and goals.          Exercise Goals and Review: Exercise  Goals    Row Name 06/06/18 1351             Exercise Goals   Increase Physical Activity  Yes       Intervention  Provide advice, education, support and counseling about physical activity/exercise needs.;Develop an individualized exercise prescription for aerobic and resistive  training based on initial evaluation findings, risk stratification, comorbidities and participant's personal goals.       Expected Outcomes  Short Term: Attend rehab on a regular basis to increase amount of physical activity.;Long Term: Add in home exercise to make exercise part of routine and to increase amount of physical activity.;Long Term: Exercising regularly at least 3-5 days a week.       Increase Strength and Stamina  Yes       Intervention  Provide advice, education, support and counseling about physical activity/exercise needs.;Develop an individualized exercise prescription for aerobic and resistive training based on initial evaluation findings, risk stratification, comorbidities and participant's personal goals.       Expected Outcomes  Short Term: Perform resistance training exercises routinely during rehab and add in resistance training at home;Long Term: Improve cardiorespiratory fitness, muscular endurance and strength as measured by increased METs and functional capacity (6MWT);Short Term: Increase workloads from initial exercise prescription for resistance, speed, and METs.       Able to understand and use rate of perceived exertion (RPE) scale  Yes       Intervention  Provide education and explanation on how to use RPE scale       Expected Outcomes  Short Term: Able to use RPE daily in rehab to express subjective intensity level;Long Term:  Able to use RPE to guide intensity level when exercising independently       Knowledge and understanding of Target Heart Rate Range (THRR)  Yes       Intervention  Provide education and explanation of THRR including how the numbers were predicted and where they are  located for reference       Expected Outcomes  Short Term: Able to state/look up THRR;Long Term: Able to use THRR to govern intensity when exercising independently;Short Term: Able to use daily as guideline for intensity in rehab       Able to check pulse independently  Yes       Intervention  Provide education and demonstration on how to check pulse in carotid and radial arteries.;Review the importance of being able to check your own pulse for safety during independent exercise       Expected Outcomes  Short Term: Able to explain why pulse checking is important during independent exercise;Long Term: Able to check pulse independently and accurately       Understanding of Exercise Prescription  Yes       Intervention  Provide education, explanation, and written materials on patient's individual exercise prescription       Expected Outcomes  Short Term: Able to explain program exercise prescription;Long Term: Able to explain home exercise prescription to exercise independently          Exercise Goals Re-Evaluation : Exercise Goals Re-Evaluation    Row Name 06/14/18 0917 06/14/18 1024 06/28/18 1618 06/30/18 0805 07/11/18 1634     Exercise Goal Re-Evaluation   Exercise Goals Review  Increase Physical Activity;Increase Strength and Stamina;Able to understand and use rate of perceived exertion (RPE) scale;Knowledge and understanding of Target Heart Rate Range (THRR);Understanding of Exercise Prescription  Increase Physical Activity;Increase Strength and Stamina;Understanding of Exercise Prescription  Increase Physical Activity;Increase Strength and Stamina;Understanding of Exercise Prescription  Increase Physical Activity;Increase Strength and Stamina;Understanding of Exercise Prescription  Increase Physical Activity;Increase Strength and Stamina;Understanding of Exercise Prescription   Comments  Reviewed RPE scale, THR and program prescription with pt today.  Pt voiced understanding and was given a copy  of goals to  take home.   Reviewed home exercise guidelines with Darrell [name].  He will continue to go to [employer] and walk.  He also has access to gym at fire house.   Darrell [name] is off to a good start in rehab.  He has completed his second full day of exercise. Today, he had some issues with his blood pressure being high coming in the door, but was able to exercise.  We will continue to monitor his progression.   Darrell [name] is doing well in rehab.  He has been walking around town as they wouldn't let him go to gym.  We talked about just going in to do his cardio and staying off the weight equipment.  He recovered from surgery quickly, but is leveling out on his strength now.  He has not had any afib symptoms and just a little pain at the insicion site.    Darrell [name] continues to do well in rehab. He comes when his work schedule allows, so he bounces around the different classes.  He is up to level 5 on the T5 NuStep.  We will continue to monitor his progression.    Expected Outcomes  Short: Use RPE daily to regulate intensity. Long: Follow program prescription in THR.  Short: Continue to exericse more regularly.  Long: Continue to increase activity and stamina.   Short: Attend class regularly.  Long: Continue to exercies independently on off days.   Short: Start adding in more cardio at the gym.  Long: Continue to stay active on off days.   Short: Continue to increase workloads.  Long: Continue to add in cardio on off days.       Discharge Exercise Prescription (Final Exercise Prescription Changes): Exercise Prescription Changes - 07/11/18 1600      Response to Exercise   Blood Pressure (Admit)  138/78    Blood Pressure (Exercise)  132/70    Blood Pressure (Exit)  126/70    Heart Rate (Admit)  72 bpm    Heart Rate (Exercise)  103 bpm    Heart Rate (Exit)  78 bpm    Rating of Perceived Exertion (Exercise)  11    Symptoms  none    Duration  Continue with 30 min of aerobic exercise without signs/symptoms of physical  distress.    Intensity  THRR unchanged      Progression   Progression  Continue to progress workloads to maintain intensity without signs/symptoms of physical distress.    Average METs  3.66      Resistance Training   Training Prescription  Yes    Weight  4 lbs    Reps  10-15      Interval Training   Interval Training  No      Treadmill   MPH  2.8    Grade  2    Minutes  15    METs  3.92      Elliptical   Level  1    Speed  4    Minutes  15      T5 Nustep   Level  5    Minutes  15    METs  3.4      Home Exercise Plan   Plans to continue exercise at  [employer] (comment)   walking, [employer], firehouse gym   Frequency  Add 3 additional days to program exercise sessions.    Initial Home Exercises Provided  06/14/18       Nutrition:  Target Goals: Understanding  of nutrition guidelines, daily intake of sodium <155m, cholesterol <2030m calories 30% from fat and 7% or less from saturated fats, daily to have 5 or more servings of fruits and vegetables.  Biometrics: Pre Biometrics - 06/06/18 1353      Pre Biometrics   Height  5' 8.1" (1.73 m)  (Pended)     Weight  222 lb 11.2 oz (101 kg)  (Pended)     Waist Circumference  42 inches  (Pended)     Hip Circumference  43 inches  (Pended)     Waist to Hip Ratio  0.98 %  (Pended)     BMI (Calculated)  33.75  (Pended)     Single Leg Stand  30 seconds  (Pended)         Nutrition Therapy Plan and Nutrition Goals: Nutrition Therapy & Goals - 06/14/18 1007      Nutrition Therapy   Diet  DASH    Drug/Food Interactions  Statins/Certain Fruits    Protein (specify units)  9oz    Fiber  30 grams    Whole Grain Foods  3 servings   chooses whole grains half of the time   Saturated Fats  16 max. grams    Fruits and Vegetables  5 servings/day   8 ideal; eats vegetables regularly but not fruits   Sodium  1500 grams      Personal Nutrition Goals   Nutrition Goal  Increase fruit intake by at least one  additional serving per week    Personal Goal #2  Increase fluid intake by at least one additional 8oz glass per day. Try adding extra fluids around exercise times first    Comments  His wife has been helping to change his diet by cooking without salt, buying low sodium products and decreasing portion sizes at meal times. She provides vegetables and whole grain choices regularly. They do not eat out often but he does choose fried foods when eating out occasionally. They eat a variety of protein sources and they do not fry foods at home. They ask for french fries without salt when eating at [facility] 3 meals/day; breakfast: cheerios with 2% milk, lunch: leftovers, sandwich, [employer], dinner: varies (soups, roast, chicken, shrimp, wheat pasta, brown rice, beef tips, salads, vegetables, baked potato). He drinks mostly water with the occasional sweet tea or diluted orange juice. He does not eat fruit regularly but does eat vegetables that his wife cooks      [place] regarding individualized specific dietary modifications aiming towards targeted core components such as weight, hypertension, lipid management, diabetes, heart failure and other comorbidities.    Expected Outcomes  Short Term Goal: Understand basic principles of dietary content, such as calories, fat, sodium, cholesterol and nutrients.;Short Term Goal: A plan has been developed with personal nutrition goals set during dietitian appointment.;Long Term Goal: Adherence to prescribed nutrition plan.       Nutrition Assessments: Nutrition Assessments - 06/06/18 1302      MEDFICTS Scores   Pre Score  42       Nutrition Goals Re-Evaluation: Nutrition Goals Re-Evaluation    Row Name 06/14/18 1012 07/12/18 0829           Goals   Nutrition Goal  Increase fluid intake by at least one additional 8oz glass per day. Try adding extra fluids around exercise times first  Increase fruit  intake by at least one additional serving per week; Increase fluid  intake by at least one additional 8oz glass per day, try adding extra fluids around exercise times first      Comment  He drinks mostly zero sugar beverages but does not feel that he drinks enough each day  He has been making an effort to drink more fluids each day. Today in class he had a glass of water with him while exercising. He feels that overall he is "doing better" with his diet and his wife is in charge of the food he eats and the cooking at home, who has been trying to provide healthier options and use less salt. He has lost a couple of pounds. He has not yet been intentional about eating more fruit      Expected Outcome  He will try to drink at least one more glass of water daily, ideally around exercise sessions  He will work on eating more fruit each week and continue to increase fluid intake. He will work with his wife to have healthier options to eat at home        Personal Goal #2 Re-Evaluation   Personal Goal #2  Increase fruit intake by at least one additional serving per week  -         Nutrition Goals Discharge (Final Nutrition Goals Re-Evaluation): Nutrition Goals Re-Evaluation - 07/12/18 0829      Goals   Nutrition Goal  Increase fruit intake by at least one additional serving per week; Increase fluid intake by at least one additional 8oz glass per day, try adding extra fluids around exercise times first    Comment  He has been making an effort to drink more fluids each day. Today in class he had a glass of water with him while exercising. He feels that overall he is "doing better" with his diet and his wife is in charge of the food he eats and the cooking at home, who has been trying to provide healthier options and use less salt. He has lost a couple of pounds. He has not yet been intentional about eating more fruit    Expected Outcome  He will work on eating more fruit each week and continue to increase fluid  intake. He will work with his wife to have healthier options to eat at home       Psychosocial: Target Goals: Acknowledge presence or absence of significant depression and/or stress, maximize coping skills, provide positive support system. Participant is able to verbalize types and ability to use techniques and skills needed for reducing stress and depression.   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 Jerry's wife is staying with her Mom every night. 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 "That is hard"  Sleeps to about 3 AM, sometimes takes time to get back to sleep until up for the day at 545       Quality of Life Scores:  Quality of Life - 06/06/18 1255      Quality of Life   Select  Quality of Life      Quality of Life Scores   Health/Function Pre  6.1 %    Socioeconomic Pre  26.69 %    Psych/Spiritual Pre  14.29 %    Family Pre  21.6 %    GLOBAL Pre  14.66 %      Scores of 19 and below usually indicate a poorer quality of life in these areas.  A difference of  2-3 points is a clinically meaningful difference.  A difference of 2-3 points in the total score of the Quality of Life Index has been associated with significant improvement in overall quality of life, self-image, physical symptoms, and general health in studies assessing change in quality of life.  PHQ-9: Recent Review Flowsheet Data    Depression screen [facility] 2/9 06/30/2018 06/09/2018 06/06/2018 12/20/2017 12/16/2017   Decreased Interest 0 _0 Down, Depressed, Hopeless _1 PHQ - 2 Score _2 Altered sleeping _3 Tired, decreased energy _4 Change in appetite 2 0 0 0 0   Feeling bad or failure about yourself  0 _5 Trouble concentrating 0  0 0 0 0   Moving slowly or fidgety/restless 0 0 0 0 0   Suicidal thoughts 0 0 0 0 0   PHQ-9 Score _6 Difficult doing work/chores Somewhat difficult - Somewhat difficult - Not difficult at all     Interpretation of Total Score  Total Score Depression Severity:  1-4 = Minimal depression, 5-9 = Mild depression, 10-14 = Moderate depression, 15-19 = Moderately severe depression, 20-27 = Severe depression   Psychosocial Evaluation and Intervention: Psychosocial Evaluation - 06/14/18 1025      Psychosocial Evaluation & Interventions   Interventions  Stress management education;Encouraged to exercise with the program and follow exercise prescription    Comments  Mr. Kosa Darrell [name]) has returned to 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  Counselor met with him today for initial psychosocial evaluation.   He is a 72 year old who has a family history of heart disease.  He has a strong support system with a spouse of 50 years; lots of family locally and active involvement with his local church.  Darrell [name] reports only getting maybe ~5 hours/night sleep.  He has a history of OSA but is no longer using the CPAP once he lost weight.  He has a good appetite.  Darrell [name] denies a history of depression or anxiety or any current symptoms and states he is typically a positive person.  He has multiple stressors currently with his own health; his 36 year old mother-in-law is being cared for by Gerry's spouse; and Darrell [name] is the [place] which can be stressful at times as well.   He has goals to feel better overall; to lose some weight and increase his stamina and strength.  Staff will follow with Darrell [name].    Expected Outcomes  Short:  Darrell [name] will develop some positive coping strategies by attending the educational components of this program and learn to manage his stress better.  He will also meet with the dietician to address his weight loss goals.  Darrell [name] will benefit from exercising consistently for his  health and mental health and as a coping strategy.  Long:  Darrell [name] will develop positive self-care habits from this program for his continued health and mental health.      Continue Psychosocial Services   Follow up required by staff       Psychosocial Re-Evaluation: Psychosocial Re-Evaluation    [place] Name 06/30/18 [phone]  Psychosocial Re-Evaluation   Current issues with  Current Stress Concerns;Current Sleep Concerns       Comments  Darrell [name] will be going to visit his granddaughter for Christmas and will miss three weeks starting in mid [place].  He is looking forward to seeing them on Christmas.  He is not sleeping very well and got an extra 54mn at night.  Not sure what is triggering it.  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  He will need to work about 13 METs to return.   He has not talked to anyone about his sleep and is not  taking anything to help.  He is able to get to sleep its the sleeping long enough that is the problem. PHQ score has gone down by one point.  It was as low as 5 at the doctors office, but it is improving.        Expected Outcomes  Short: Continue to cope with job stress.  Long: Continue to work on sleep.        Interventions  Stress management education;Encouraged to attend Cardiac Rehabilitation for the exercise       Continue Psychosocial Services   Follow up required by staff       Comments  MOther in law in Skilled facility. Jerry's wife is staying with her Mom every night. 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 "That is hard"  Sleeps to about 3 AM, sometimes takes time to get back to sleep until up for the day at 545         Initial Review   Source of Stress Concerns  Family;Occupation          Psychosocial Discharge (Final Psychosocial Re-Evaluation): Psychosocial Re-Evaluation - 06/30/18 0808       Psychosocial Re-Evaluation   Current issues with  Current Stress Concerns;Current Sleep Concerns    Comments  JSonia Sidewill be going to visit his granddaughter for Christmas and will miss three weeks starting in mid DTamms  He is looking forward to seeing them on Christmas.  He is not sleeping very well and got an extra 246m at night.  Not sure what is triggering it.  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  He will need to work about 13 METs to return.   He has not talked to anyone about his sleep and is not  taking anything to help.  He is able to get to sleep its the sleeping long enough that is the problem. PHQ score has gone down by one point.  It was as low as 5 at the doctors office, but it is improving.     Expected Outcomes  Short: Continue to cope with job stress.  Long: Continue to work on sleep.     Interventions  Stress management education;Encouraged to attend Cardiac Rehabilitation for the exercise    Continue Psychosocial Services   Follow up required by staff    Comments  MOther in law in Skilled facility. Jerry's wife is staying with her Mom every night. 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 "That is hard"  Sleeps to about 3 AM, sometimes takes time  to get back to sleep until up for the day at 545      Initial Review   Source of Stress Concerns  Family;Occupation       Vocational Rehabilitation: Provide vocational rehab assistance to qualifying candidates.   Vocational Rehab Evaluation & Intervention: Vocational Rehab - 06/06/18 1304      Initial Vocational Rehab Evaluation & Intervention   Assessment shows need for Vocational Rehabilitation  No       Education: Education Goals: Education classes will be provided on a variety of topics geared toward better understanding of heart health and risk factor modification.  Participant will state understanding/return demonstration of topics presented as noted by education test scores.  Learning Barriers/Preferences: Learning Barriers/Preferences - 06/06/18 1303      Learning Barriers/Preferences   Learning Barriers  None    Learning Preferences  None       Education Topics:  AED/CPR: - Group verbal and written instruction with the use of models to demonstrate the basic use of the AED with the basic ABC's of resuscitation.   General Nutrition Guidelines/Fats and Fiber: -Group instruction provided by verbal, written material, models and posters to present the general guidelines for heart healthy nutrition. Gives an explanation and review of dietary fats and fiber.   Cardiac Rehab from 01/04/2018 in [facility] Cardiac and Pulmonary Rehab  Date  11/09/17  Educator  PI  Instruction Review Code  1- Verbalizes Understanding      Controlling Sodium/Reading Food Labels: -Group verbal and written material supporting the discussion of sodium use in heart healthy nutrition. Review and explanation with models, verbal and written materials for utilization of the food label.   Cardiac Rehab from 01/04/2018 in [facility] Cardiac and Pulmonary Rehab  Date  01/04/18  Educator  PI  Instruction Review Code  1- Verbalizes Understanding      Exercise Physiology & General Exercise Guidelines: - Group verbal and written instruction with models to review the exercise physiology of the cardiovascular system and associated critical values. Provides general exercise guidelines with specific guidelines to those with heart or lung disease.    Cardiac Rehab from 01/04/2018 in [facility] Cardiac and Pulmonary Rehab  Date  11/25/17  Educator  [facility]  Instruction Review Code  1- Verbalizes Understanding      Aerobic Exercise & Resistance Training: - Gives group verbal and written instruction on the various components of exercise. Focuses on aerobic and resistive training programs and the benefits of  this training and how to safely progress through these programs..   Flexibility, Balance, Mind/Body Relaxation: Provides group verbal/written instruction on the benefits of flexibility and balance training, including mind/body exercise modes such as yoga, pilates and tai chi.  Demonstration and skill practice provided.   Cardiac Rehab from 07/19/2018 in [facility] Cardiac and Pulmonary Rehab  Date  07/12/18  Educator  AS  Instruction Review Code  1- Verbalizes Understanding      Stress and Anxiety: - Provides group verbal and written instruction about the health risks of elevated stress and causes of high stress.  Discuss the correlation between heart/lung disease and anxiety and treatment options. Review healthy ways to manage with stress and anxiety.   Cardiac Rehab from 01/04/2018 in [facility] Cardiac and Pulmonary Rehab  Date  12/14/17  Educator  [facility]  Instruction Review Code  1- Verbalizes Understanding      Depression: - Provides group verbal and written instruction on the correlation between heart/lung disease and depressed mood, treatment options, and the stigmas  associated with seeking treatment.   Anatomy & Physiology of the Heart: - Group verbal and written instruction and models provide basic cardiac anatomy and physiology, with the coronary electrical and arterial systems. Review of Valvular disease and Heart Failure   Cardiac Rehab from 01/04/2018 in [facility] Cardiac and Pulmonary Rehab  Date  12/16/17  Educator  CE  Instruction Review Code  1- Verbalizes Understanding      Cardiac Procedures: - Group verbal and written instruction to review commonly prescribed medications for heart disease. Reviews the medication, class of the drug, and [name] effects. Includes the steps to properly store meds and maintain the prescription regimen. (beta blockers and nitrates)   Cardiac Rehab from 07/19/2018 in [facility] Cardiac and Pulmonary Rehab  Date  06/14/18  Educator  KS  Instruction Review Code  1-  Verbalizes Understanding      Cardiac Medications I: - Group verbal and written instruction to review commonly prescribed medications for heart disease. Reviews the medication, class of the drug, and [name] effects. Includes the steps to properly store meds and maintain the prescription regimen.   Cardiac Rehab from 01/04/2018 in [facility] Cardiac and Pulmonary Rehab  Date  12/21/17  Educator  SB  Instruction Review Code  1- Verbalizes Understanding      Cardiac Medications II: -Group verbal and written instruction to review commonly prescribed medications for heart disease. Reviews the medication, class of the drug, and [name] effects. (all other drug classes)   Cardiac Rehab from 07/19/2018 in [facility] Cardiac and Pulmonary Rehab  Date  07/19/18  Educator  SB  Instruction Review Code  1- Verbalizes Understanding       Go Sex-Intimacy & Heart Disease, Get SMART - Goal Setting: - Group verbal and written instruction through game format to discuss heart disease and the return to sexual intimacy. Provides group verbal and written material to discuss and apply goal setting through the application of the S.M.A.R.T. Method.   Cardiac Rehab from 07/19/2018 in [facility] Cardiac and Pulmonary Rehab  Date  06/14/18  Educator  [place]  Instruction Review Code  1- Verbalizes Understanding      Other Matters of the Heart: - Provides group verbal, written materials and models to describe Stable Angina and Peripheral Artery. Includes description of the disease process and treatment options available to the cardiac patient.   Cardiac Rehab from 01/04/2018 in [facility] Cardiac and Pulmonary Rehab  Date  12/16/17  Educator  CE  Instruction Review Code  1- Verbalizes Understanding      Exercise & Equipment Safety: - Individual verbal instruction and demonstration of equipment use and safety with use of the equipment.   Cardiac Rehab from 07/19/2018 in [facility] Cardiac and Pulmonary Rehab  Date  06/06/18  Educator  Sn   Instruction Review Code  1- Verbalizes Understanding      Infection Prevention: - Provides verbal and written material to individual with discussion of infection control including proper hand washing and proper equipment cleaning during exercise session.   Cardiac Rehab from 07/19/2018 in [facility] Cardiac and Pulmonary Rehab  Date  06/06/18  Educator  Sb  Instruction Review Code  1- Verbalizes Understanding      Falls Prevention: - Provides verbal and written material to individual with discussion of falls prevention and safety.   Cardiac Rehab from 07/19/2018 in [facility] Cardiac and Pulmonary Rehab  Date  06/06/18  Educator  Sb  Instruction Review Code  1- Verbalizes Understanding      Diabetes: - Individual verbal  and written instruction to review signs/symptoms of diabetes, desired ranges of glucose level fasting, after meals and with exercise. Acknowledge that pre and post exercise glucose checks will be done for 3 sessions at entry of program.   Know Your Numbers and Risk Factors: -Group verbal and written instruction about important numbers in your health.  Discussion of what are risk factors and how they play a role in the disease process.  Review of Cholesterol, Blood Pressure, Diabetes, and BMI and the role they play in your overall health.   Cardiac Rehab from 07/19/2018 in [facility] Cardiac and Pulmonary Rehab  Date  07/19/18  Educator  SB  Instruction Review Code  1- Verbalizes Understanding      Sleep Hygiene: -Provides group verbal and written instruction about how sleep can affect your health.  Define sleep hygiene, discuss sleep cycles and impact of sleep habits. Review good sleep hygiene tips.    Cardiac Rehab from 07/19/2018 in [facility] Cardiac and Pulmonary Rehab  Date  06/28/18  Educator  [facility]  Instruction Review Code  1- Verbalizes Understanding      Other: -Provides group and verbal instruction on various topics (see comments)   Knowledge Questionnaire  Score: Knowledge Questionnaire Score - 06/06/18 1303      Knowledge Questionnaire Score   Pre Score  24/26   reviewd correct responses with Darrell [name] todauy. He verbalized understanding of the responses and had no further questions.       Core Components/Risk Factors/Patient Goals at Admission: Personal Goals and Risk Factors at Admission - 06/06/18 1305      Core Components/Risk Factors/Patient Goals on Admission    Weight Management  Yes;Obesity;Weight Loss    Admit Weight  214 lb (97.1 kg)    Goal Weight: Short Term  212 lb (96.2 kg)    Goal Weight: Long Term  190 lb (86.2 kg)    Expected Outcomes  Short Term: Continue to assess and modify interventions until short term weight is achieved;Long Term: Adherence to nutrition and physical activity/exercise program aimed toward attainment of established weight goal;Weight Loss: Understanding of general recommendations for a balanced deficit meal plan, which promotes 1-2 lb weight loss per week and includes a negative energy balance of [phone] kcal/d    Hypertension  Yes    Intervention  Provide education on lifestyle modifcations including regular physical activity/exercise, weight management, moderate sodium restriction and increased consumption of fresh fruit, vegetables, and low fat dairy, alcohol moderation, and smoking cessation.;Monitor prescription use compliance.    Expected Outcomes  Short Term: Continued assessment and intervention until BP is < 140/24m HG in hypertensive participants. < 130/848mHG in hypertensive participants with diabetes, heart failure or chronic kidney disease.;Long Term: Maintenance of blood pressure at goal levels.    Lipids  Yes    Intervention  Provide education and support for participant on nutrition & aerobic/resistive exercise along with prescribed medications to achieve LDL <704mHDL >39m32m  Expected Outcomes  Short Term: Participant states understanding of desired cholesterol values and is compliant  with medications prescribed. Participant is following exercise prescription and nutrition guidelines.;Long Term: Cholesterol controlled with medications as prescribed, with individualized exercise RX and with personalized nutrition plan. Value goals: LDL < 70mg61mL > 40 mg.       Core Components/Risk Factors/Patient Goals Review:  Goals and Risk Factor Review    Row Name 06/30/18 0812 [phone]        Core Components/Risk Factors/Patient Goals Review  Personal Goals Review  Weight Management/Obesity;Hypertension;Lipids       Review  Jerry's weight is climbing again.  He is eating but not always what is healthy.  He is doing well with his blood pressures other than the other day when he missed his medication in the morning.  He checks it at home twice a day.  He is doing well on his medications and may adjust his blood pressure meds some as it sometimes is slightly elevated.        Expected Outcomes  Short: Get in more cardio to help with weight loss.  Long: Continue to monitor blood pressures.           Core Components/Risk Factors/Patient Goals at Discharge (Final Review):  Goals and Risk Factor Review - 06/30/18 0812      Core Components/Risk Factors/Patient Goals Review   Personal Goals Review  Weight Management/Obesity;Hypertension;Lipids    Review  Jerry's weight is climbing again.  He is eating but not always what is healthy.  He is doing well with his blood pressures other than the other day when he missed his medication in the morning.  He checks it at home twice a day.  He is doing well on his medications and may adjust his blood pressure meds some as it sometimes is slightly elevated.     Expected Outcomes  Short: Get in more cardio to help with weight loss.  Long: Continue to monitor blood pressures.        ITP Comments: ITP Comments    Row Name 06/06/18 1240 06/22/18 0559 06/28/18 0818 07/20/18 6606     ITP Comments  Medical review completed today. New diagnosis new program  start.  ITP sent to Dr Sabra Heck for review, changes as needed and signature. Documentation of diagnosis can be found in [facility] 9/03 visit  30 day review. Continue with ITP unless direccted changes per Medical Director Chart Review. New to program  Jerry's blood pressure was elevated when he came in this morning (200/102).  He sat for a while and was able to come down to the 170s.  He did the T5 and treadmill today without any problems.   30 day review. Continue with ITP unless direccted changes per Medical Director Chart Review.       Comments:

## 2018-07-21 ENCOUNTER — Encounter: Payer: Medicare Other | Admitting: *Deleted

## 2018-07-21 DIAGNOSIS — Z951 Presence of aortocoronary bypass graft: Secondary | ICD-10-CM | POA: Diagnosis not present

## 2018-07-21 DIAGNOSIS — Z79899 Other long term (current) drug therapy: Secondary | ICD-10-CM | POA: Diagnosis not present

## 2018-07-21 DIAGNOSIS — Z7902 Long term (current) use of antithrombotics/antiplatelets: Secondary | ICD-10-CM | POA: Diagnosis not present

## 2018-07-21 DIAGNOSIS — Z955 Presence of coronary angioplasty implant and graft: Secondary | ICD-10-CM

## 2018-07-21 DIAGNOSIS — I2510 Atherosclerotic heart disease of native coronary artery without angina pectoris: Secondary | ICD-10-CM | POA: Diagnosis not present

## 2018-07-21 DIAGNOSIS — Z7982 Long term (current) use of aspirin: Secondary | ICD-10-CM | POA: Diagnosis not present

## 2018-07-21 DIAGNOSIS — Z7901 Long term (current) use of anticoagulants: Secondary | ICD-10-CM | POA: Diagnosis not present

## 2018-07-21 NOTE — Progress Notes (Signed)
Daily Session Note  Patient Details  Name: Darrell [name] MRN: [id number] Date of Birth: 1946/08/12 Referring Provider:     Cardiac Rehab from 06/06/2018 in [facility] Cardiac and Pulmonary Rehab  Referring Provider  Serafina Royals MD      Encounter Date: 07/21/2018  Check In: Session Check In - 07/21/18 0804      Check-In   Supervising physician immediately available to respond to emergencies  See telemetry face sheet for immediately available ER MD    Location  [facility]-Cardiac & Pulmonary Rehab    Staff Present  [name] RN, BSN;[name] BS, Exercise Physiologist;Jessica Luan Pulling, MA, RCEP, CCRP, Exercise Physiologist;Amanda Oletta Darter, BA, ACSM CEP, Exercise Physiologist    Medication changes reported      No    Fall or balance concerns reported     No    Warm-up and Cool-down  Performed as group-led instruction    Resistance Training Performed  Yes    VAD Patient?  No    PAD/SET Patient?  No      Pain Assessment   Currently in Pain?  No/denies          Social History   Tobacco Use  Smoking Status Former Smoker  . Types: Cigarettes  . Last attempt to quit: 1989  . Years since quitting: 30.9  Smokeless Tobacco Never Used  Tobacco Comment   quit 30 years ago     Goals Met:  Independence with exercise equipment Exercise tolerated well No report of cardiac concerns or symptoms Strength training completed today  Goals Unmet:  Not Applicable  Comments: Pt able to follow exercise prescription today without complaint.  Will continue to monitor for progression.    Dr. Emily Filbert is Medical Director for [facility] and [facility].

## 2018-07-22 ENCOUNTER — Encounter: Payer: Self-pay | Admitting: Family Medicine

## 2018-07-26 DIAGNOSIS — Z7982 Long term (current) use of aspirin: Secondary | ICD-10-CM | POA: Diagnosis not present

## 2018-07-26 DIAGNOSIS — Z7901 Long term (current) use of anticoagulants: Secondary | ICD-10-CM | POA: Diagnosis not present

## 2018-07-26 DIAGNOSIS — Z955 Presence of coronary angioplasty implant and graft: Secondary | ICD-10-CM

## 2018-07-26 DIAGNOSIS — Z951 Presence of aortocoronary bypass graft: Secondary | ICD-10-CM | POA: Diagnosis not present

## 2018-07-26 DIAGNOSIS — I2510 Atherosclerotic heart disease of native coronary artery without angina pectoris: Secondary | ICD-10-CM | POA: Diagnosis not present

## 2018-07-26 DIAGNOSIS — Z79899 Other long term (current) drug therapy: Secondary | ICD-10-CM | POA: Diagnosis not present

## 2018-07-26 DIAGNOSIS — Z7902 Long term (current) use of antithrombotics/antiplatelets: Secondary | ICD-10-CM | POA: Diagnosis not present

## 2018-07-26 NOTE — Progress Notes (Signed)
Daily Session Note  Patient Details  Name: Darrell [name] MRN: [id number] Date of Birth: 12/16/1945 Referring Provider:     Cardiac Rehab from 06/06/2018 in [facility] Cardiac and Pulmonary Rehab  Referring Provider  Kowalski, Bruce MD      Encounter Date: 07/26/2018  Check In: Session Check In - 07/26/18 0908      Check-In   Supervising physician immediately available to respond to emergencies  See telemetry face sheet for immediately available ER MD    Location  [facility]-Cardiac & Pulmonary Rehab    Staff Present  Susanne Bice, RN, BSN, CCRP;Jessica Hawkins, MA, RCEP, CCRP, Exercise Physiologist;[name] BS, Exercise Physiologist    Medication changes reported      No    Fall or balance concerns reported     No    Tobacco Cessation  No Change    Warm-up and Cool-down  Performed as group-led instruction    Resistance Training Performed  Yes    VAD Patient?  No    PAD/SET Patient?  No      Pain Assessment   Currently in Pain?  No/denies          Social History   Tobacco Use  Smoking Status Former Smoker  . Types: Cigarettes  . Last attempt to quit: 1989  . Years since quitting: 30.9  Smokeless Tobacco Never Used  Tobacco Comment   quit 30 years ago     Goals Met:  Independence with exercise equipment Exercise tolerated well No report of cardiac concerns or symptoms Strength training completed today  Goals Unmet:  Not Applicable  Comments: Pt able to follow exercise prescription today without complaint.  Will continue to monitor for progression.   Dr. Mark Miller is Medical Director for [facility] and [facility]. 

## 2018-07-28 ENCOUNTER — Telehealth: Payer: Self-pay | Admitting: *Deleted

## 2018-07-28 ENCOUNTER — Encounter: Payer: Self-pay | Admitting: *Deleted

## 2018-07-28 NOTE — Progress Notes (Signed)
Cardiac Individual Treatment Plan  Patient Details  Name: Darrell [name] MRN: [id number] Date of Birth: 1946/07/14 Referring Provider:     Cardiac Rehab from 06/06/2018 in [facility] Cardiac and Pulmonary Rehab  Referring Provider  Serafina Royals MD      Initial Encounter Date:    Cardiac Rehab from 06/06/2018 in [facility] Cardiac and Pulmonary Rehab  Date  06/06/18      Visit Diagnosis: No diagnosis found.  Patient's Home Medications on Admission:  Current Outpatient Medications:  .  allopurinol (ZYLOPRIM) 300 MG tablet, Take 0.5 tablets (150 mg total) by mouth daily., Disp: 90 tablet, Rfl: 4 .  apixaban (ELIQUIS) 5 MG TABS tablet, Take by mouth., Disp: , Rfl:  .  aspirin EC 81 MG tablet, Take by mouth., Disp: , Rfl:  .  atorvastatin (LIPITOR) 80 MG tablet, Take 80 mg by mouth daily., Disp: , Rfl:  .  carvedilol (COREG) 6.25 MG tablet, Take by mouth., Disp: , Rfl:  .  clopidogrel (PLAVIX) 75 MG tablet, Take 75 mg by mouth daily., Disp: , Rfl:  .  hydrochlorothiazide (HYDRODIURIL) 25 MG tablet, Take 1 tablet (25 mg total) by mouth daily. (Patient not taking: Reported on 06/06/2018), Disp: 90 tablet, Rfl: 4 .  isosorbide mononitrate (IMDUR) 30 MG 24 hr tablet, Take by mouth., Disp: , Rfl:  .  nitroGLYCERIN (NITROSTAT) 0.4 MG SL tablet, Place 1 tablet (0.4 mg total) under the tongue every 5 (five) minutes as needed for chest pain. (Patient not taking: Reported on 06/06/2018), Disp: 25 tablet, Rfl: 1 .  telmisartan (MICARDIS) 80 MG tablet, Take by mouth., Disp: , Rfl:   Past Medical History: Past Medical History:  Diagnosis Date  . CAD (coronary artery disease)   . Cancer [facility])    prostate  . Carotid artery stenosis   . Hyperlipidemia   . Hypertension   . Psoriasis     Tobacco Use: Social History   Tobacco Use  Smoking Status Former Smoker  . Types: Cigarettes  . Last attempt to quit: 1989  . Years since quitting: 30.9  Smokeless Tobacco Never Used  Tobacco Comment   quit  30 years ago     Labs: Recent Review Flowsheet Data    Labs for ITP Cardiac and Pulmonary Rehab Latest Ref Rng & Units 11/18/2016 12/17/2016 05/20/2017 06/09/2017 12/20/2017   Cholestrol 100 - 199 mg/dL 112 104 - 106 134   LDLCALC 0 - 99 mg/dL - 39 - 42 44   HDL >39 mg/dL - 41 - 41 39(L)   Trlycerides 0 - 149 mg/dL 264(H) 122 - 114 257(H)   Hemoglobin A1c 4.8 - 5.6 % - - 5.6 - -       Exercise Target Goals: Exercise Program Goal: Individual exercise prescription set using results from initial 6 min walk test and THRR while considering  patient's activity barriers and safety.   Exercise Prescription Goal: Initial exercise prescription builds to 30-45 minutes a day of aerobic activity, 2-3 days per week.  Home exercise guidelines will be given to patient during program as part of exercise prescription that the participant will acknowledge.  Activity Barriers & Risk Stratification: Activity Barriers & Cardiac Risk Stratification - 06/06/18 1252      Activity Barriers & Cardiac Risk Stratification   Activity Barriers  Deconditioning;Muscular Weakness    Cardiac Risk Stratification  High       6 Minute Walk: 6 Minute Walk    Row Name 06/06/18 1348  6 Minute Walk   Phase  Initial     Distance  1520 feet     Walk Time  6 minutes     # of Rest Breaks  0     MPH  2.88     METS  2.91     RPE  11     VO2 Peak  10.18     Symptoms  No     Resting HR  74 bpm     Resting BP  108/64     Resting Oxygen Saturation   97 %     Exercise Oxygen Saturation  during 6 min walk  98 %     Max Ex. HR  115 bpm     Max Ex. BP  174/76     2 Minute Post BP  126/62        Oxygen Initial Assessment:   Oxygen Re-Evaluation:   Oxygen Discharge (Final Oxygen Re-Evaluation):   Initial Exercise Prescription: Initial Exercise Prescription - 06/06/18 1300      Date of Initial Exercise RX and Referring Provider   Date  06/06/18    Referring Provider  Serafina Royals MD      Treadmill    MPH  2.3    Grade  0.5    Minutes  15    METs  2.93      Elliptical   Level  1    Speed  3    Minutes  15      T5 Nustep   Level  3    SPM  80    Minutes  15    METs  3      Prescription Details   Frequency (times per week)  2    Duration  Progress to 30 minutes of continuous aerobic without signs/symptoms of physical distress      Intensity   THRR 40-80% of Max Heartrate  104-135    Ratings of Perceived Exertion  11-13    Perceived Dyspnea  0-4      Progression   Progression  Continue to progress workloads to maintain intensity without signs/symptoms of physical distress.      Resistance Training   Training Prescription  Yes    Weight  4 lbs    Reps  10-15       Perform Capillary Blood Glucose checks as needed.  Exercise Prescription Changes: Exercise Prescription Changes    Row Name 06/06/18 1200 06/14/18 1000 06/28/18 1600 07/11/18 1600 07/27/18 1400     Response to Exercise   Blood Pressure (Admit)  108/64  136/60  200/100 164/84  138/78  122/72   Blood Pressure (Exercise)  174/76  140/62  170/78  132/70  160/70   Blood Pressure (Exit)  126/62  126/70  146/80  126/70  118/70   Heart Rate (Admit)  74 bpm  65 bpm  48 bpm  72 bpm  59 bpm   Heart Rate (Exercise)  115 bpm  95 bpm  110 bpm  103 bpm  118 bpm   Heart Rate (Exit)  85 bpm  64 bpm  85 bpm  78 bpm  52 bpm   Oxygen Saturation (Admit)  97 %  -  -  -  -   Oxygen Saturation (Exercise)  98 %  -  -  -  -   Rating of Perceived Exertion (Exercise)  _0 Symptoms  none  none  none  none  none   Comments  walk test results  first full day of exercise  second full day of exercise  -  -   Duration  -  Continue with 30 min of aerobic exercise without signs/symptoms of physical distress.  Continue with 30 min of aerobic exercise without signs/symptoms of physical distress.  Continue with 30 min of aerobic exercise without signs/symptoms of physical distress.  Continue with 30 min of aerobic exercise  without signs/symptoms of physical distress.   Intensity  -  THRR unchanged  THRR unchanged  THRR unchanged  THRR unchanged     Progression   Progression  -  Continue to progress workloads to maintain intensity without signs/symptoms of physical distress.  Continue to progress workloads to maintain intensity without signs/symptoms of physical distress.  Continue to progress workloads to maintain intensity without signs/symptoms of physical distress.  Continue to progress workloads to maintain intensity without signs/symptoms of physical distress.   Average METs  -  2.9  2.96  3.66  3.41     Resistance Training   Training Prescription  -  Yes  Yes  Yes  Yes   Weight  -  4 lbs  4 lbs  4 lbs  4 lbs   Reps  -  10-15  10-15  10-15  10-15     Interval Training   Interval Training  -  No  No  No  No     Treadmill   MPH  -  -  2.8  2.8  2.8   Grade  -  -  _0 Minutes  -  -  _1 METs  -  -  3.92  3.92  3.92     Elliptical   Level  -  _2 Speed  -  _3 4.5   Minutes  -  _4 T5 Nustep   Level  -  _5 Minutes  -  _6 METs  -  2.9  2  3.4  2.9     Home Exercise Plan   Plans to continue exercise at  -  [employer] (comment) walking, [employer], [place] (comment) walking, [employer], [place] (comment) walking, [employer], [place] (comment) walking, [employer], firehouse gym   Frequency  -  Add 3 additional days to program exercise sessions.  Add 3 additional days to program exercise sessions.  Add 3 additional days to program exercise sessions.  Add 3 additional days to program exercise sessions.   Initial Home Exercises Provided  -  06/14/18  06/14/18  06/14/18  06/14/18      Exercise Comments: Exercise Comments    Row Name 06/14/18 [phone]           Exercise Comments  First full day of exercise!  Patient was oriented to gym  and equipment including functions, settings, policies, and procedures.  Patient's individual exercise prescription and treatment plan were reviewed.  All starting workloads were established based on the results of the 6 minute walk test done at initial orientation visit.  The plan for exercise progression was also introduced and progression will be customized based on  patient's performance and goals.          Exercise Goals and Review: Exercise Goals    Row Name 06/06/18 1351             Exercise Goals   Increase Physical Activity  Yes       Intervention  Provide advice, education, support and counseling about physical activity/exercise needs.;Develop an individualized exercise prescription for aerobic and resistive training based on initial evaluation findings, risk stratification, comorbidities and participant's personal goals.       Expected Outcomes  Short Term: Attend rehab on a regular basis to increase amount of physical activity.;Long Term: Add in home exercise to make exercise part of routine and to increase amount of physical activity.;Long Term: Exercising regularly at least 3-5 days a week.       Increase Strength and Stamina  Yes       Intervention  Provide advice, education, support and counseling about physical activity/exercise needs.;Develop an individualized exercise prescription for aerobic and resistive training based on initial evaluation findings, risk stratification, comorbidities and participant's personal goals.       Expected Outcomes  Short Term: Perform resistance training exercises routinely during rehab and add in resistance training at home;Long Term: Improve cardiorespiratory fitness, muscular endurance and strength as measured by increased METs and functional capacity (6MWT);Short Term: Increase workloads from initial exercise prescription for resistance, speed, and METs.       Able to understand and use rate of perceived exertion (RPE) scale  Yes        Intervention  Provide education and explanation on how to use RPE scale       Expected Outcomes  Short Term: Able to use RPE daily in rehab to express subjective intensity level;Long Term:  Able to use RPE to guide intensity level when exercising independently       Knowledge and understanding of Target Heart Rate Range (THRR)  Yes       Intervention  Provide education and explanation of THRR including how the numbers were predicted and where they are located for reference       Expected Outcomes  Short Term: Able to state/look up THRR;Long Term: Able to use THRR to govern intensity when exercising independently;Short Term: Able to use daily as guideline for intensity in rehab       Able to check pulse independently  Yes       Intervention  Provide education and demonstration on how to check pulse in carotid and radial arteries.;Review the importance of being able to check your own pulse for safety during independent exercise       Expected Outcomes  Short Term: Able to explain why pulse checking is important during independent exercise;Long Term: Able to check pulse independently and accurately       Understanding of Exercise Prescription  Yes       Intervention  Provide education, explanation, and written materials on patient's individual exercise prescription       Expected Outcomes  Short Term: Able to explain program exercise prescription;Long Term: Able to explain home exercise prescription to exercise independently          Exercise Goals Re-Evaluation : Exercise Goals Re-Evaluation    Row Name 06/14/18 0917 06/14/18 1024 06/28/18 1618 06/30/18 0805 07/11/18 1634     Exercise Goal Re-Evaluation   Exercise Goals Review  Increase Physical Activity;Increase Strength and Stamina;Able to understand and use rate of perceived exertion (RPE) scale;Knowledge and understanding of Target Heart  Rate Range (THRR);Understanding of Exercise Prescription  Increase Physical Activity;Increase Strength and  Stamina;Understanding of Exercise Prescription  Increase Physical Activity;Increase Strength and Stamina;Understanding of Exercise Prescription  Increase Physical Activity;Increase Strength and Stamina;Understanding of Exercise Prescription  Increase Physical Activity;Increase Strength and Stamina;Understanding of Exercise Prescription   Comments  Reviewed RPE scale, THR and program prescription with pt today.  Pt voiced understanding and was given a copy of goals to take home.   Reviewed home exercise guidelines with Darrell [name].  He will continue to go to [employer] and walk.  He also has access to gym at fire house.   Darrell [name] is off to a good start in rehab.  He has completed his second full day of exercise. Today, he had some issues with his blood pressure being high coming in the door, but was able to exercise.  We will continue to monitor his progression.   Darrell [name] is doing well in rehab.  He has been walking around town as they wouldn't let him go to gym.  We talked about just going in to do his cardio and staying off the weight equipment.  He recovered from surgery quickly, but is leveling out on his strength now.  He has not had any afib symptoms and just a little pain at the insicion site.    Darrell [name] continues to do well in rehab. He comes when his work schedule allows, so he bounces around the different classes.  He is up to level 5 on the T5 NuStep.  We will continue to monitor his progression.    Expected Outcomes  Short: Use RPE daily to regulate intensity. Long: Follow program prescription in THR.  Short: Continue to exericse more regularly.  Long: Continue to increase activity and stamina.   Short: Attend class regularly.  Long: Continue to exercies independently on off days.   Short: Start adding in more cardio at the gym.  Long: Continue to stay active on off days.   Short: Continue to increase workloads.  Long: Continue to add in cardio on off days.    [place] Name 07/21/18 5366 07/27/18 1434            Exercise Goal Re-Evaluation   Exercise Goals Review  Increase Physical Activity;Increase Strength and Stamina;Able to check pulse independently  Increase Physical Activity;Increase Strength and Stamina;Able to check pulse independently      Comments  Darrell [name] continues do very well in rehab. He is doing cardio walking and using the Elliptical at [employer]. He is not using weights there. He is walking in the parade this weekend.  Darrell [name] is up to level on the elliptical now.  He continues to do well.  He is planning to walk while out on vacation to [place].  We will continue to monitor his progress.       Expected Outcomes  Short: Continue to workout 2-3 extra days. Long: Darrell [name] would like to add weight machines back to his workout regimen. Start with 15 lbs and work up slowly.  Short: Walk while out on vacation.  Long: Continue to regain strength back.          Discharge Exercise Prescription (Final Exercise Prescription Changes): Exercise Prescription Changes - 07/27/18 1400      Response to Exercise   Blood Pressure (Admit)  122/72    Blood Pressure (Exercise)  160/70    Blood Pressure (Exit)  118/70    Heart Rate (Admit)  59 bpm    Heart Rate (Exercise)  118  bpm    Heart Rate (Exit)  52 bpm    Rating of Perceived Exertion (Exercise)  11    Symptoms  none    Duration  Continue with 30 min of aerobic exercise without signs/symptoms of physical distress.    Intensity  THRR unchanged      Progression   Progression  Continue to progress workloads to maintain intensity without signs/symptoms of physical distress.    Average METs  3.41      Resistance Training   Training Prescription  Yes    Weight  4 lbs    Reps  10-15      Interval Training   Interval Training  No      Treadmill   MPH  2.8    Grade  2    Minutes  15    METs  3.92      Elliptical   Level  5    Speed  4.5    Minutes  15      T5 Nustep   Level  5    Minutes  15    METs  2.9      Home Exercise Plan    Plans to continue exercise at  [employer] (comment)   walking, [employer], firehouse gym   Frequency  Add 3 additional days to program exercise sessions.    Initial Home Exercises Provided  06/14/18       Nutrition:  Target Goals: Understanding of nutrition guidelines, daily intake of sodium <1547m, cholesterol <2091m calories 30% from fat and 7% or less from saturated fats, daily to have 5 or more servings of fruits and vegetables.  Biometrics: Pre Biometrics - 06/06/18 1353      Pre Biometrics   Height  5' 8.1" (1.73 m)  (Pended)     Weight  222 lb 11.2 oz (101 kg)  (Pended)     Waist Circumference  42 inches  (Pended)     Hip Circumference  43 inches  (Pended)     Waist to Hip Ratio  0.98 %  (Pended)     BMI (Calculated)  33.75  (Pended)     Single Leg Stand  30 seconds  (Pended)         Nutrition Therapy Plan and Nutrition Goals: Nutrition Therapy & Goals - 06/14/18 1007      Nutrition Therapy   Diet  DASH    Drug/Food Interactions  Statins/Certain Fruits    Protein (specify units)  9oz    Fiber  30 grams    Whole Grain Foods  3 servings   chooses whole grains half of the time   Saturated Fats  16 max. grams    Fruits and Vegetables  5 servings/day   8 ideal; eats vegetables regularly but not fruits   Sodium  1500 grams      Personal Nutrition Goals   Nutrition Goal  Increase fruit intake by at least one additional serving per week    Personal Goal #2  Increase fluid intake by at least one additional 8oz glass per day. Try adding extra fluids around exercise times first    Comments  His wife has been helping to change his diet by cooking without salt, buying low sodium products and decreasing portion sizes at meal times. She provides vegetables and whole grain choices regularly. They do not eat out often but he does choose fried foods when eating out occasionally. They eat a variety of protein sources and they  do not fry foods at home. They ask for french  fries without salt when eating at [facility]. Eats 3 meals/day; breakfast: cheerios with 2% milk, lunch: leftovers, sandwich, [employer], dinner: varies (soups, roast, chicken, shrimp, wheat pasta, brown rice, beef tips, salads, vegetables, baked potato). He drinks mostly water with the occasional sweet tea or diluted orange juice. He does not eat fruit regularly but does eat vegetables that his wife cooks      [place], educate and counsel regarding individualized specific dietary modifications aiming towards targeted core components such as weight, hypertension, lipid management, diabetes, heart failure and other comorbidities.    Expected Outcomes  Short Term Goal: Understand basic principles of dietary content, such as calories, fat, sodium, cholesterol and nutrients.;Short Term Goal: A plan has been developed with personal nutrition goals set during dietitian appointment.;Long Term Goal: Adherence to prescribed nutrition plan.       Nutrition Assessments: Nutrition Assessments - 06/06/18 1302      MEDFICTS Scores   Pre Score  42       Nutrition Goals Re-Evaluation: Nutrition Goals Re-Evaluation    Row Name 06/14/18 1012 07/12/18 0829           Goals   Nutrition Goal  Increase fluid intake by at least one additional 8oz glass per day. Try adding extra fluids around exercise times first  Increase fruit intake by at least one additional serving per week; Increase fluid intake by at least one additional 8oz glass per day, try adding extra fluids around exercise times first      Comment  He drinks mostly zero sugar beverages but does not feel that he drinks enough each day  He has been making an effort to drink more fluids each day. Today in class he had a glass of water with him while exercising. He feels that overall he is "doing better" with his diet and his wife is in charge of the food he eats and the cooking at home, who has been trying to provide healthier  options and use less salt. He has lost a couple of pounds. He has not yet been intentional about eating more fruit      Expected Outcome  He will try to drink at least one more glass of water daily, ideally around exercise sessions  He will work on eating more fruit each week and continue to increase fluid intake. He will work with his wife to have healthier options to eat at home        Personal Goal #2 Re-Evaluation   Personal Goal #2  Increase fruit intake by at least one additional serving per week  -         Nutrition Goals Discharge (Final Nutrition Goals Re-Evaluation): Nutrition Goals Re-Evaluation - 07/12/18 0829      Goals   Nutrition Goal  Increase fruit intake by at least one additional serving per week; Increase fluid intake by at least one additional 8oz glass per day, try adding extra fluids around exercise times first    Comment  He has been making an effort to drink more fluids each day. Today in class he had a glass of water with him while exercising. He feels that overall he is "doing better" with his diet and his wife is in charge of the food he eats and the cooking at home, who has been trying to provide healthier options and use less salt. He has lost a couple of pounds.  He has not yet been intentional about eating more fruit    Expected Outcome  He will work on eating more fruit each week and continue to increase fluid intake. He will work with his wife to have healthier options to eat at home       Psychosocial: Target Goals: Acknowledge presence or absence of significant depression and/or stress, maximize coping skills, provide positive support system. Participant is able to verbalize types and ability to use techniques and skills needed for reducing stress and depression.   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 "That is hard"  Sleeps to about 3 AM, sometimes takes time to get back to sleep until up for the day at 545       Quality of Life Scores:  Quality of Life - 06/06/18 1255      Quality of Life   Select  Quality of Life      Quality of Life Scores   Health/Function Pre  6.1 %    Socioeconomic Pre  26.69 %    Psych/Spiritual Pre  14.29 %    Family Pre  21.6 %    GLOBAL Pre  14.66 %      Scores of 19 and below usually indicate a poorer quality of life in these areas.  A difference of  2-3 points is a clinically meaningful difference.  A difference of 2-3 points in the total score of the Quality of Life Index has been associated with significant improvement in overall quality of life, self-image, physical symptoms, and general health in studies assessing change in quality of life.  PHQ-9: Recent Review Flowsheet Data    Depression screen [facility] 2/9 06/30/2018 06/09/2018 06/06/2018 12/20/2017 12/16/2017   Decreased Interest 0 _0 Down, Depressed, Hopeless _1 PHQ - 2 Score _2 Altered sleeping _3 Tired, decreased energy _4 Change in appetite 2 0 0 0 0   Feeling bad or failure about yourself  0 _5 Trouble concentrating 0 0 0 0 0   Moving slowly or fidgety/restless 0 0 0 0 0   Suicidal thoughts 0 0 0 0 0   PHQ-9 Score _6 Difficult doing work/chores Somewhat difficult - Somewhat difficult - Not difficult at all     Interpretation of Total Score  Total Score Depression Severity:  1-4 = Minimal depression, 5-9 = Mild depression, 10-14 = Moderate depression, 15-19 = Moderately severe depression, 20-27 = Severe depression   Psychosocial Evaluation and Intervention: Psychosocial Evaluation - 06/14/18 1025      Psychosocial Evaluation & Interventions    Interventions  Stress management education;Encouraged to exercise with the program and follow exercise prescription    Comments  Darrell [name]) has returned to 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  Counselor met with him today for initial psychosocial evaluation.   He is a 72 year old who has a family history of heart disease.  He has a  strong support system with a spouse of 68 years; lots of family locally and active involvement with his local church.  Darrell [name] reports only getting maybe ~5 hours/night sleep.  He has a history of OSA but is no longer using the CPAP once he lost weight.  He has a good appetite.  Darrell [name] denies a history of depression or anxiety or any current symptoms and states he is typically a positive person.  He has multiple stressors currently with his own health; his 65 year old mother-in-law is being cared for by Gerry's spouse; and Darrell [name] is the [place] which can be stressful at times as well.   He has goals to feel better overall; to lose some weight and increase his stamina and strength.  Staff will follow with Darrell [name].    Expected Outcomes  Short:  Darrell [name] will develop some positive coping strategies by attending the educational components of this program and learn to manage his stress better.  He will also meet with the dietician to address his weight loss goals.  Darrell [name] will benefit from exercising consistently for his health and mental health and as a coping strategy.  Long:  Darrell [name] will develop positive self-care habits from this program for his continued health and mental health.      Continue Psychosocial Services   Follow up required by staff       Psychosocial Re-Evaluation: Psychosocial Re-Evaluation    [place] Name 06/30/18 [phone] 07/21/18 0816           Psychosocial Re-Evaluation   Current issues with  Current Stress Concerns;Current Sleep Concerns  Current Stress Concerns;Current Sleep Concerns      Comments  Darrell [name] will be going to visit his granddaughter  for Christmas and will miss three weeks starting in mid [place].  He is looking forward to seeing them on Christmas.  He is not sleeping very well and got an extra 73mn at night.  Not sure what is triggering it.  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  He will need to work about 13 METs to return.   He has not talked to anyone about his sleep and is not  taking anything to help.  He is able to get to sleep its the sleeping long enough that is the problem. PHQ score has gone down by one point.  It was as low as 5 at the doctors office, but it is improving.   JSonia Sidehad a lot of work to get done before his trip. His sleep is not good. He goes to bed at 11:30pm and was waking up around 3:22am. and now sleeping until 5 am, so it has improved some. He does not usually go back to sleep when he wakes in the middle of the night but will try our Relaxation techniques to return to sleep. [place] of his sons house. He will stay active while he is there.      Expected Outcomes  Short: Continue to cope with job stress.  Long: Continue to work on sleep.   Short: Continue to do his jobs within his current capabilites. He is going to alarms but not doing anything active. He tells everyone to stay back. Long: Continue to work on sleep.  Interventions  Stress management education;Encouraged to attend Cardiac Rehabilitation for the exercise  -      Continue Psychosocial Services   Follow up required by staff  -      Comments  MOther in law in Skilled facility. Jerry's wife is staying with her Mom every night. 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 "That is hard"  Sleeps to about 3 AM, sometimes takes time to get back to sleep until up for the day at 545  -        Initial Review   Source of Stress  Concerns  Family;Occupation  -         Psychosocial Discharge (Final Psychosocial Re-Evaluation): Psychosocial Re-Evaluation - 07/21/18 0816      Psychosocial Re-Evaluation   Current issues with  Current Stress Concerns;Current Sleep Concerns    Comments  Darrell [name] had a lot of work to get done before his trip. His sleep is not good. He goes to bed at 11:30pm and was waking up around 3:22am. and now sleeping until 5 am, so it has improved some. He does not usually go back to sleep when he wakes in the middle of the night but will try our Relaxation techniques to return to sleep. He is excited for his trip to [place] and is staying in a hotel in walking distance of his sons house. He will stay active while he is there.    Expected Outcomes  Short: Continue to do his jobs within his current capabilites. He is going to alarms but not doing anything active. He tells everyone to stay back. Long: Continue to work on sleep.       Vocational Rehabilitation: Provide vocational rehab assistance to qualifying candidates.   Vocational Rehab Evaluation & Intervention: Vocational Rehab - 06/06/18 1304      Initial Vocational Rehab Evaluation & Intervention   Assessment shows need for Vocational Rehabilitation  No       Education: Education Goals: Education classes will be provided on a variety of topics geared toward better understanding of heart health and risk factor modification. Participant will state understanding/return demonstration of topics presented as noted by education test scores.  Learning Barriers/Preferences: Learning Barriers/Preferences - 06/06/18 1303      Learning Barriers/Preferences   Learning Barriers  None    Learning Preferences  None       Education Topics:  AED/CPR: - Group verbal and written instruction with the use of models to demonstrate the basic use of the AED with the basic ABC's of resuscitation.   General Nutrition Guidelines/Fats and Fiber: -Group  instruction provided by verbal, written material, models and posters to present the general guidelines for heart healthy nutrition. Gives an explanation and review of dietary fats and fiber.   Cardiac Rehab from 01/04/2018 in [facility] Cardiac and Pulmonary Rehab  Date  11/09/17  Educator  PI  Instruction Review Code  1- Verbalizes Understanding      Controlling Sodium/Reading Food Labels: -Group verbal and written material supporting the discussion of sodium use in heart healthy nutrition. Review and explanation with models, verbal and written materials for utilization of the food label.   Cardiac Rehab from 01/04/2018 in [facility] Cardiac and Pulmonary Rehab  Date  01/04/18  Educator  PI  Instruction Review Code  1- Verbalizes Understanding      Exercise Physiology & General Exercise Guidelines: - Group verbal and written instruction with models to review the exercise physiology of the cardiovascular system and associated critical values.  Provides general exercise guidelines with specific guidelines to those with heart or lung disease.    Cardiac Rehab from 01/04/2018 in [facility] Cardiac and Pulmonary Rehab  Date  11/25/17  Educator  [facility]  Instruction Review Code  1- Verbalizes Understanding      Aerobic Exercise & Resistance Training: - Gives group verbal and written instruction on the various components of exercise. Focuses on aerobic and resistive training programs and the benefits of this training and how to safely progress through these programs..   Flexibility, Balance, Mind/Body Relaxation: Provides group verbal/written instruction on the benefits of flexibility and balance training, including mind/body exercise modes such as yoga, pilates and tai chi.  Demonstration and skill practice provided.   Cardiac Rehab from 07/26/2018 in [facility] Cardiac and Pulmonary Rehab  Date  07/12/18  Educator  AS  Instruction Review Code  1- Verbalizes Understanding      Stress and Anxiety: - Provides group  verbal and written instruction about the health risks of elevated stress and causes of high stress.  Discuss the correlation between heart/lung disease and anxiety and treatment options. Review healthy ways to manage with stress and anxiety.   Cardiac Rehab from 01/04/2018 in [facility] Cardiac and Pulmonary Rehab  Date  12/14/17  Educator  [facility]  Instruction Review Code  1- Verbalizes Understanding      Depression: - Provides group verbal and written instruction on the correlation between heart/lung disease and depressed mood, treatment options, and the stigmas associated with seeking treatment.   Cardiac Rehab from 07/26/2018 in [facility] Cardiac and Pulmonary Rehab  Date  07/21/18  Educator  [facility]  Instruction Review Code  1- Verbalizes Understanding      Anatomy & Physiology of the Heart: - Group verbal and written instruction and models provide basic cardiac anatomy and physiology, with the coronary electrical and arterial systems. Review of Valvular disease and Heart Failure   Cardiac Rehab from 01/04/2018 in [facility] Cardiac and Pulmonary Rehab  Date  12/16/17  Educator  CE  Instruction Review Code  1- Verbalizes Understanding      Cardiac Procedures: - Group verbal and written instruction to review commonly prescribed medications for heart disease. Reviews the medication, class of the drug, and [name] effects. Includes the steps to properly store meds and maintain the prescription regimen. (beta blockers and nitrates)   Cardiac Rehab from 07/26/2018 in [facility] Cardiac and Pulmonary Rehab  Date  07/26/18  Educator  SB  Instruction Review Code  1- Verbalizes Understanding      Cardiac Medications I: - Group verbal and written instruction to review commonly prescribed medications for heart disease. Reviews the medication, class of the drug, and [name] effects. Includes the steps to properly store meds and maintain the prescription regimen.   Cardiac Rehab from 01/04/2018 in [facility] Cardiac and Pulmonary  Rehab  Date  12/21/17  Educator  SB  Instruction Review Code  1- Verbalizes Understanding      Cardiac Medications II: -Group verbal and written instruction to review commonly prescribed medications for heart disease. Reviews the medication, class of the drug, and [name] effects. (all other drug classes)   Cardiac Rehab from 07/26/2018 in [facility] Cardiac and Pulmonary Rehab  Date  07/19/18  Educator  SB  Instruction Review Code  1- Verbalizes Understanding       Go Sex-Intimacy & Heart Disease, Get SMART - Goal Setting: - Group verbal and written instruction through game format to discuss heart disease and the return to sexual intimacy. Provides  group verbal and written material to discuss and apply goal setting through the application of the S.M.A.R.T. Method.   Cardiac Rehab from 07/26/2018 in [facility] Cardiac and Pulmonary Rehab  Date  07/26/18  Educator  SB  Instruction Review Code  1- Verbalizes Understanding      Other Matters of the Heart: - Provides group verbal, written materials and models to describe Stable Angina and Peripheral Artery. Includes description of the disease process and treatment options available to the cardiac patient.   Cardiac Rehab from 01/04/2018 in [facility] Cardiac and Pulmonary Rehab  Date  12/16/17  Educator  CE  Instruction Review Code  1- Verbalizes Understanding      Exercise & Equipment Safety: - Individual verbal instruction and demonstration of equipment use and safety with use of the equipment.   Cardiac Rehab from 07/26/2018 in [facility] Cardiac and Pulmonary Rehab  Date  06/06/18  Educator  Sn  Instruction Review Code  1- Verbalizes Understanding      Infection Prevention: - Provides verbal and written material to individual with discussion of infection control including proper hand washing and proper equipment cleaning during exercise session.   Cardiac Rehab from 07/26/2018 in [facility] Cardiac and Pulmonary Rehab  Date  06/06/18  Educator  Sb   Instruction Review Code  1- Verbalizes Understanding      Falls Prevention: - Provides verbal and written material to individual with discussion of falls prevention and safety.   Cardiac Rehab from 07/26/2018 in [facility] Cardiac and Pulmonary Rehab  Date  06/06/18  Educator  Sb  Instruction Review Code  1- Verbalizes Understanding      Diabetes: - Individual verbal and written instruction to review signs/symptoms of diabetes, desired ranges of glucose level fasting, after meals and with exercise. Acknowledge that pre and post exercise glucose checks will be done for 3 sessions at entry of program.   Know Your Numbers and Risk Factors: -Group verbal and written instruction about important numbers in your health.  Discussion of what are risk factors and how they play a role in the disease process.  Review of Cholesterol, Blood Pressure, Diabetes, and BMI and the role they play in your overall health.   Cardiac Rehab from 07/26/2018 in [facility] Cardiac and Pulmonary Rehab  Date  07/19/18  Educator  SB  Instruction Review Code  1- Verbalizes Understanding      Sleep Hygiene: -Provides group verbal and written instruction about how sleep can affect your health.  Define sleep hygiene, discuss sleep cycles and impact of sleep habits. Review good sleep hygiene tips.    Cardiac Rehab from 07/26/2018 in [facility] Cardiac and Pulmonary Rehab  Date  06/28/18  Educator  [facility]  Instruction Review Code  1- Verbalizes Understanding      Other: -Provides group and verbal instruction on various topics (see comments)   Knowledge Questionnaire Score: Knowledge Questionnaire Score - 06/06/18 1303      Knowledge Questionnaire Score   Pre Score  24/26   reviewd correct responses with Darrell [name] todauy. He verbalized understanding of the responses and had no further questions.       Core Components/Risk Factors/Patient Goals at Admission: Personal Goals and Risk Factors at Admission - 06/06/18 1305      Core  Components/Risk Factors/Patient Goals on Admission    Weight Management  Yes;Obesity;Weight Loss    Admit Weight  214 lb (97.1 kg)    Goal Weight: Short Term  212 lb (96.2 kg)    Goal Weight: Long  Term  190 lb (86.2 kg)    Expected Outcomes  Short Term: Continue to assess and modify interventions until short term weight is achieved;Long Term: Adherence to nutrition and physical activity/exercise program aimed toward attainment of established weight goal;Weight Loss: Understanding of general recommendations for a balanced deficit meal plan, which promotes 1-2 lb weight loss per week and includes a negative energy balance of [phone] kcal/d    Hypertension  Yes    Intervention  Provide education on lifestyle modifcations including regular physical activity/exercise, weight management, moderate sodium restriction and increased consumption of fresh fruit, vegetables, and low fat dairy, alcohol moderation, and smoking cessation.;Monitor prescription use compliance.    Expected Outcomes  Short Term: Continued assessment and intervention until BP is < 140/30m HG in hypertensive participants. < 130/877mHG in hypertensive participants with diabetes, heart failure or chronic kidney disease.;Long Term: Maintenance of blood pressure at goal levels.    Lipids  Yes    Intervention  Provide education and support for participant on nutrition & aerobic/resistive exercise along with prescribed medications to achieve LDL <7015mHDL >80m64m  Expected Outcomes  Short Term: Participant states understanding of desired cholesterol values and is compliant with medications prescribed. Participant is following exercise prescription and nutrition guidelines.;Long Term: Cholesterol controlled with medications as prescribed, with individualized exercise RX and with personalized nutrition plan. Value goals: LDL < 70mg38mL > 40 mg.       Core Components/Risk Factors/Patient Goals Review:  Goals and Risk Factor Review    Row  Name 06/30/18 0812 63845/19 0824           Core Components/Risk Factors/Patient Goals Review   Personal Goals Review  Weight Management/Obesity;Hypertension;Lipids  Weight Management/Obesity;Hypertension;Lipids      Review  Jerry's weight is climbing again.  He is eating but not always what is healthy.  He is doing well with his blood pressures other than the other day when he missed his medication in the morning.  He checks it at home twice a day.  He is doing well on his medications and may adjust his blood pressure meds some as it sometimes is slightly elevated.   We are going to add some more Interval training to try to give him a jump start his weight loss. Blood pressure has been high. He takes his meds regularly. We will send a note with BP's to Dr. KowalNehemiah Massedee if he wants to make meds changes.       Expected Outcomes  Short: Get in more cardio to help with weight loss.  Long: Continue to monitor blood pressures.   Short: Start HIIT on cardio equipment.  Long: Continue to take meds and monitor blood pressure.          Core Components/Risk Factors/Patient Goals at Discharge (Final Review):  Goals and Risk Factor Review - 07/21/18 0824      Core Components/Risk Factors/Patient Goals Review   Personal Goals Review  Weight Management/Obesity;Hypertension;Lipids    Review  We are going to add some more Interval training to try to give him a jump start his weight loss. Blood pressure has been high. He takes his meds regularly. We will send a note with BP's to Dr. KowalNehemiah Massedee if he wants to make meds changes.     Expected Outcomes  Short: Start HIIT on cardio equipment.  Long: Continue to take meds and monitor blood pressure.        ITP Comments: ITP Comments  [place] Name 06/06/18 1240 06/22/18 0559 06/28/18 0818 07/20/18 0607 07/27/18 1433   ITP Comments  Medical review completed today. New diagnosis new program start.  ITP sent to Dr Sabra Heck for review, changes as needed and  signature. Documentation of diagnosis can be found in [facility] 9/03 visit  30 day review. Continue with ITP unless direccted changes per Medical Director Chart Review. New to program  Jerry's blood pressure was elevated when he came in this morning (200/102).  He sat for a while and was able to come down to the 170s.  He did the T5 and treadmill today without any problems.   30 day review. Continue with ITP unless direccted changes per Medical Director Chart Review.  Darrell [name] will be out for three weeks going to visit his grands in [place] for Christmas.       Comments: Note    I just left a vm to check to see how Jerry's [name]) blood pressures have been running. He was not in Cardiac Rehab today. I did get a note back from Serafina Royals, M.D. and the box was checked that said "continue with program protocals" (for notification his blood pressure uppon arrival was 160/86. ). I think Darrell [name] might be out of town so in my vm I asked him to cont to check his blood pressures if possible.

## 2018-07-28 NOTE — Telephone Encounter (Signed)
I just left a vm to check to see how Jerry's [name]) blood pressures have been running. He was not in Cardiac Rehab today. I did get a note back from Serafina Royals, M.D. and the box was checked that said "continue with program protocals" (for notification his blood pressure uppon arrival was 160/86. ). I think [name] might be out of town so in my vm I asked him to cont to check his blood pressures if possible.

## 2018-07-29 ENCOUNTER — Ambulatory Visit: Payer: Medicare Other | Admitting: Oncology

## 2018-07-29 ENCOUNTER — Ambulatory Visit: Payer: Medicare Other

## 2018-08-09 ENCOUNTER — Encounter: Payer: Self-pay | Admitting: *Deleted

## 2018-08-09 DIAGNOSIS — Z951 Presence of aortocoronary bypass graft: Secondary | ICD-10-CM

## 2018-08-16 ENCOUNTER — Encounter: Payer: Self-pay | Admitting: *Deleted

## 2018-08-16 DIAGNOSIS — Z951 Presence of aortocoronary bypass graft: Secondary | ICD-10-CM

## 2018-08-16 NOTE — Progress Notes (Signed)
Cardiac Individual Treatment Plan  Patient Details  Name: Darrell [name] MRN: [id number] Date of Birth: [birth year] Referring Provider:     Cardiac Rehab from 06/06/2018 in [facility] Cardiac and Pulmonary Rehab  Referring Provider  Serafina Royals MD      Initial Encounter Date:    Cardiac Rehab from 06/06/2018 in [facility] Cardiac and Pulmonary Rehab  Date  06/06/18      Visit Diagnosis: S/P CABG x 2  Patient's Home Medications on Admission:  Current Outpatient Medications:  .  allopurinol (ZYLOPRIM) 300 MG tablet, Take 0.5 tablets (150 mg total) by mouth daily., Disp: 90 tablet, Rfl: 4 .  apixaban (ELIQUIS) 5 MG TABS tablet, Take by mouth., Disp: , Rfl:  .  aspirin EC 81 MG tablet, Take by mouth., Disp: , Rfl:  .  atorvastatin (LIPITOR) 80 MG tablet, Take 80 mg by mouth daily., Disp: , Rfl:  .  carvedilol (COREG) 6.25 MG tablet, Take by mouth., Disp: , Rfl:  .  clopidogrel (PLAVIX) 75 MG tablet, Take 75 mg by mouth daily., Disp: , Rfl:  .  hydrochlorothiazide (HYDRODIURIL) 25 MG tablet, Take 1 tablet (25 mg total) by mouth daily. (Patient not taking: Reported on 06/06/2018), Disp: 90 tablet, Rfl: 4 .  isosorbide mononitrate (IMDUR) 30 MG 24 hr tablet, Take by mouth., Disp: , Rfl:  .  nitroGLYCERIN (NITROSTAT) 0.4 MG SL tablet, Place 1 tablet (0.4 mg total) under the tongue every 5 (five) minutes as needed for chest pain. (Patient not taking: Reported on 06/06/2018), Disp: 25 tablet, Rfl: 1 .  telmisartan (MICARDIS) 80 MG tablet, Take by mouth., Disp: , Rfl:   Past Medical History: Past Medical History:  Diagnosis Date  . CAD (coronary artery disease)   . Cancer [facility])    prostate  . Carotid artery stenosis   . Hyperlipidemia   . Hypertension   . Psoriasis     Tobacco Use: Social History   Tobacco Use  Smoking Status Former Smoker  . Types: Cigarettes  . Last attempt to quit: 1989  . Years since quitting: 31.0  Smokeless Tobacco Never Used  Tobacco Comment   quit 30  years ago     Labs: Recent Review Flowsheet Data    Labs for ITP Cardiac and Pulmonary Rehab Latest Ref Rng & Units 11/18/2016 12/17/2016 05/20/2017 06/09/2017 12/20/2017   Cholestrol 100 - 199 mg/dL 112 104 - 106 134   LDLCALC 0 - 99 mg/dL - 39 - 42 44   HDL >39 mg/dL - 41 - 41 39(L)   Trlycerides 0 - 149 mg/dL 264(H) 122 - 114 257(H)   Hemoglobin A1c 4.8 - 5.6 % - 6.0 5.6 - -       Exercise Target Goals: Exercise Program Goal: Individual exercise prescription set using results from initial 6 min walk test and THRR while considering  patient's activity barriers and safety.   Exercise Prescription Goal: Initial exercise prescription builds to 30-45 minutes a day of aerobic activity, 2-3 days per week.  Home exercise guidelines will be given to patient during program as part of exercise prescription that the participant will acknowledge.  Activity Barriers & Risk Stratification: Activity Barriers & Cardiac Risk Stratification - 06/06/18 1252      Activity Barriers & Cardiac Risk Stratification   Activity Barriers  Deconditioning;Muscular Weakness    Cardiac Risk Stratification  High       6 Minute Walk: 6 Minute Walk    Row Name 06/06/18 1348  6 Minute Walk   Phase  Initial     Distance  1520 feet     Walk Time  6 minutes     # of Rest Breaks  0     MPH  2.88     METS  2.91     RPE  11     VO2 Peak  10.18     Symptoms  No     Resting HR  74 bpm     Resting BP  108/64     Resting Oxygen Saturation   97 %     Exercise Oxygen Saturation  during 6 min walk  98 %     Max Ex. HR  115 bpm     Max Ex. BP  174/76     2 Minute Post BP  126/62        Oxygen Initial Assessment:   Oxygen Re-Evaluation:   Oxygen Discharge (Final Oxygen Re-Evaluation):   Initial Exercise Prescription: Initial Exercise Prescription - 06/06/18 1300      Date of Initial Exercise RX and Referring Provider   Date  06/06/18    Referring Provider  Serafina Royals MD      Treadmill    MPH  2.3    Grade  0.5    Minutes  15    METs  2.93      Elliptical   Level  1    Speed  3    Minutes  15      T5 Nustep   Level  3    SPM  80    Minutes  15    METs  3      Prescription Details   Frequency (times per week)  2    Duration  Progress to 30 minutes of continuous aerobic without signs/symptoms of physical distress      Intensity   THRR 40-80% of Max Heartrate  104-135    Ratings of Perceived Exertion  11-13    Perceived Dyspnea  0-4      Progression   Progression  Continue to progress workloads to maintain intensity without signs/symptoms of physical distress.      Resistance Training   Training Prescription  Yes    Weight  4 lbs    Reps  10-15       Perform Capillary Blood Glucose checks as needed.  Exercise Prescription Changes: Exercise Prescription Changes    Row Name 06/06/18 1200 06/14/18 1000 06/28/18 1600 07/11/18 1600 07/27/18 1400     Response to Exercise   Blood Pressure (Admit)  108/64  136/60  200/100 164/84  138/78  122/72   Blood Pressure (Exercise)  174/76  140/62  170/78  132/70  160/70   Blood Pressure (Exit)  126/62  126/70  146/80  126/70  118/70   Heart Rate (Admit)  74 bpm  65 bpm  48 bpm  72 bpm  59 bpm   Heart Rate (Exercise)  115 bpm  95 bpm  110 bpm  103 bpm  118 bpm   Heart Rate (Exit)  85 bpm  64 bpm  85 bpm  78 bpm  52 bpm   Oxygen Saturation (Admit)  97 %  -  -  -  -   Oxygen Saturation (Exercise)  98 %  -  -  -  -   Rating of Perceived Exertion (Exercise)  '11  10  10  11  11   '$ Symptoms  none  none  none  none  none   Comments  walk test results  first full day of exercise  second full day of exercise  -  -   Duration  -  Continue with 30 min of aerobic exercise without signs/symptoms of physical distress.  Continue with 30 min of aerobic exercise without signs/symptoms of physical distress.  Continue with 30 min of aerobic exercise without signs/symptoms of physical distress.  Continue with 30 min of aerobic exercise  without signs/symptoms of physical distress.   Intensity  -  THRR unchanged  THRR unchanged  THRR unchanged  THRR unchanged     Progression   Progression  -  Continue to progress workloads to maintain intensity without signs/symptoms of physical distress.  Continue to progress workloads to maintain intensity without signs/symptoms of physical distress.  Continue to progress workloads to maintain intensity without signs/symptoms of physical distress.  Continue to progress workloads to maintain intensity without signs/symptoms of physical distress.   Average METs  -  2.9  2.96  3.66  3.41     Resistance Training   Training Prescription  -  Yes  Yes  Yes  Yes   Weight  -  4 lbs  4 lbs  4 lbs  4 lbs   Reps  -  10-15  10-15  10-15  10-15     Interval Training   Interval Training  -  No  No  No  No     Treadmill   MPH  -  -  2.8  2.8  2.8   Grade  -  -  _0 Minutes  -  -  _1 METs  -  -  3.92  3.92  3.92     Elliptical   Level  -  _2 Speed  -  _3 4.5   Minutes  -  _4 T5 Nustep   Level  -  _5 Minutes  -  _6 METs  -  2.9  2  3.4  2.9     Home Exercise Plan   Plans to continue exercise at  -  [employer] (comment) walking, [employer], [place] (comment) walking, [employer], [place] (comment) walking, [employer], [place] (comment) walking, [employer], firehouse gym   Frequency  -  Add 3 additional days to program exercise sessions.  Add 3 additional days to program exercise sessions.  Add 3 additional days to program exercise sessions.  Add 3 additional days to program exercise sessions.   Initial Home Exercises Provided  -  06/14/18  06/14/18  06/14/18  06/14/18      Exercise Comments: Exercise Comments    Row Name 06/14/18 [phone]           Exercise Comments  First full day of exercise!  Patient was oriented to gym  and equipment including functions, settings, policies, and procedures.  Patient's individual exercise prescription and treatment plan were reviewed.  All starting workloads were established based on the results of the 6 minute walk test done at initial orientation visit.  The plan for exercise progression was also introduced and progression will be customized based on  patient's performance and goals.          Exercise Goals and Review: Exercise Goals    Row Name 06/06/18 1351             Exercise Goals   Increase Physical Activity  Yes       Intervention  Provide advice, education, support and counseling about physical activity/exercise needs.;Develop an individualized exercise prescription for aerobic and resistive training based on initial evaluation findings, risk stratification, comorbidities and participant's personal goals.       Expected Outcomes  Short Term: Attend rehab on a regular basis to increase amount of physical activity.;Long Term: Add in home exercise to make exercise part of routine and to increase amount of physical activity.;Long Term: Exercising regularly at least 3-5 days a week.       Increase Strength and Stamina  Yes       Intervention  Provide advice, education, support and counseling about physical activity/exercise needs.;Develop an individualized exercise prescription for aerobic and resistive training based on initial evaluation findings, risk stratification, comorbidities and participant's personal goals.       Expected Outcomes  Short Term: Perform resistance training exercises routinely during rehab and add in resistance training at home;Long Term: Improve cardiorespiratory fitness, muscular endurance and strength as measured by increased METs and functional capacity (6MWT);Short Term: Increase workloads from initial exercise prescription for resistance, speed, and METs.       Able to understand and use rate of perceived exertion (RPE) scale  Yes        Intervention  Provide education and explanation on how to use RPE scale       Expected Outcomes  Short Term: Able to use RPE daily in rehab to express subjective intensity level;Long Term:  Able to use RPE to guide intensity level when exercising independently       Knowledge and understanding of Target Heart Rate Range (THRR)  Yes       Intervention  Provide education and explanation of THRR including how the numbers were predicted and where they are located for reference       Expected Outcomes  Short Term: Able to state/look up THRR;Long Term: Able to use THRR to govern intensity when exercising independently;Short Term: Able to use daily as guideline for intensity in rehab       Able to check pulse independently  Yes       Intervention  Provide education and demonstration on how to check pulse in carotid and radial arteries.;Review the importance of being able to check your own pulse for safety during independent exercise       Expected Outcomes  Short Term: Able to explain why pulse checking is important during independent exercise;Long Term: Able to check pulse independently and accurately       Understanding of Exercise Prescription  Yes       Intervention  Provide education, explanation, and written materials on patient's individual exercise prescription       Expected Outcomes  Short Term: Able to explain program exercise prescription;Long Term: Able to explain home exercise prescription to exercise independently          Exercise Goals Re-Evaluation : Exercise Goals Re-Evaluation    Row Name 06/14/18 0917 06/14/18 1024 06/28/18 1618 06/30/18 0805 07/11/18 1634     Exercise Goal Re-Evaluation   Exercise Goals Review  Increase Physical Activity;Increase Strength and Stamina;Able to understand and use rate of perceived exertion (RPE) scale;Knowledge and understanding of Target Heart  Rate Range (THRR);Understanding of Exercise Prescription  Increase Physical Activity;Increase Strength and  Stamina;Understanding of Exercise Prescription  Increase Physical Activity;Increase Strength and Stamina;Understanding of Exercise Prescription  Increase Physical Activity;Increase Strength and Stamina;Understanding of Exercise Prescription  Increase Physical Activity;Increase Strength and Stamina;Understanding of Exercise Prescription   Comments  Reviewed RPE scale, THR and program prescription with pt today.  Pt voiced understanding and was given a copy of goals to take home.   Reviewed home exercise guidelines with Darrell [name].  He will continue to go to [employer] and walk.  He also has access to gym at fire house.   Darrell [name] is off to a good start in rehab.  He has completed his second full day of exercise. Today, he had some issues with his blood pressure being high coming in the door, but was able to exercise.  We will continue to monitor his progression.   Darrell [name] is doing well in rehab.  He has been walking around town as they wouldn't let him go to gym.  We talked about just going in to do his cardio and staying off the weight equipment.  He recovered from surgery quickly, but is leveling out on his strength now.  He has not had any afib symptoms and just a little pain at the insicion site.    Darrell [name] continues to do well in rehab. He comes when his work schedule allows, so he bounces around the different classes.  He is up to level 5 on the T5 NuStep.  We will continue to monitor his progression.    Expected Outcomes  Short: Use RPE daily to regulate intensity. Long: Follow program prescription in THR.  Short: Continue to exericse more regularly.  Long: Continue to increase activity and stamina.   Short: Attend class regularly.  Long: Continue to exercies independently on off days.   Short: Start adding in more cardio at the gym.  Long: Continue to stay active on off days.   Short: Continue to increase workloads.  Long: Continue to add in cardio on off days.    [place] Name 07/21/18 5035 07/27/18 1434 08/09/18 0959          Exercise Goal Re-Evaluation   Exercise Goals Review  Increase Physical Activity;Increase Strength and Stamina;Able to check pulse independently  Increase Physical Activity;Increase Strength and Stamina;Able to check pulse independently  -     Comments  Darrell [name] continues do very well in rehab. He is doing cardio walking and using the Elliptical at [employer]. He is not using weights there. He is walking in the parade this weekend.  Darrell [name] is up to level on the elliptical now.  He continues to do well.  He is planning to walk while out on vacation to [place].  We will continue to monitor his progress.   Out since last review     Expected Outcomes  Short: Continue to workout 2-3 extra days. Long: Darrell [name] would like to add weight machines back to his workout regimen. Start with 15 lbs and work up slowly.  Short: Walk while out on vacation.  Long: Continue to regain strength back.   -        Discharge Exercise Prescription (Final Exercise Prescription Changes): Exercise Prescription Changes - 07/27/18 1400      Response to Exercise   Blood Pressure (Admit)  122/72    Blood Pressure (Exercise)  160/70    Blood Pressure (Exit)  118/70    Heart Rate (Admit)  59 bpm  Heart Rate (Exercise)  118 bpm    Heart Rate (Exit)  52 bpm    Rating of Perceived Exertion (Exercise)  11    Symptoms  none    Duration  Continue with 30 min of aerobic exercise without signs/symptoms of physical distress.    Intensity  THRR unchanged      Progression   Progression  Continue to progress workloads to maintain intensity without signs/symptoms of physical distress.    Average METs  3.41      Resistance Training   Training Prescription  Yes    Weight  4 lbs    Reps  10-15      Interval Training   Interval Training  No      Treadmill   MPH  2.8    Grade  2    Minutes  15    METs  3.92      Elliptical   Level  5    Speed  4.5    Minutes  15      T5 Nustep   Level  5    Minutes  15    METs   2.9      Home Exercise Plan   Plans to continue exercise at  [employer] (comment)   walking, [employer], firehouse gym   Frequency  Add 3 additional days to program exercise sessions.    Initial Home Exercises Provided  06/14/18       Nutrition:  Target Goals: Understanding of nutrition guidelines, daily intake of sodium '1500mg'$ , cholesterol '200mg'$ , calories 30% from fat and 7% or less from saturated fats, daily to have 5 or more servings of fruits and vegetables.  Biometrics: Pre Biometrics - 06/06/18 1353      Pre Biometrics   Height  5' 8.1" (1.73 m)  (Pended)     Weight  222 lb 11.2 oz (101 kg)  (Pended)     Waist Circumference  42 inches  (Pended)     Hip Circumference  43 inches  (Pended)     Waist to Hip Ratio  0.98 %  (Pended)     BMI (Calculated)  33.75  (Pended)     Single Leg Stand  30 seconds  (Pended)         Nutrition Therapy Plan and Nutrition Goals: Nutrition Therapy & Goals - 06/14/18 1007      Nutrition Therapy   Diet  DASH    Drug/Food Interactions  Statins/Certain Fruits    Protein (specify units)  9oz    Fiber  30 grams    Whole Grain Foods  3 servings   chooses whole grains half of the time   Saturated Fats  16 max. grams    Fruits and Vegetables  5 servings/day   8 ideal; eats vegetables regularly but not fruits   Sodium  1500 grams      Personal Nutrition Goals   Nutrition Goal  Increase fruit intake by at least one additional serving per week    Personal Goal #2  Increase fluid intake by at least one additional 8oz glass per day. Try adding extra fluids around exercise times first    Comments  His wife has been helping to change his diet by cooking without salt, buying low sodium products and decreasing portion sizes at meal times. She provides vegetables and whole grain choices regularly. They do not eat out often but he does choose fried foods when eating out occasionally. They eat a variety  of protein sources and they do not fry  foods at home. They ask for french fries without salt when eating at [employer]. Eats 3 meals/day; breakfast: cheerios with 2% milk, lunch: leftovers, sandwich, [employer], dinner: varies (soups, roast, chicken, shrimp, wheat pasta, brown rice, beef tips, salads, vegetables, baked potato). He drinks mostly water with the occasional sweet tea or diluted orange juice. He does not eat fruit regularly but does eat vegetables that his wife cooks      [place], educate and counsel regarding individualized specific dietary modifications aiming towards targeted core components such as weight, hypertension, lipid management, diabetes, heart failure and other comorbidities.    Expected Outcomes  Short Term Goal: Understand basic principles of dietary content, such as calories, fat, sodium, cholesterol and nutrients.;Short Term Goal: A plan has been developed with personal nutrition goals set during dietitian appointment.;Long Term Goal: Adherence to prescribed nutrition plan.       Nutrition Assessments: Nutrition Assessments - 06/06/18 1302      MEDFICTS Scores   Pre Score  42       Nutrition Goals Re-Evaluation: Nutrition Goals Re-Evaluation    Row Name 06/14/18 1012 07/12/18 0829           Goals   Nutrition Goal  Increase fluid intake by at least one additional 8oz glass per day. Try adding extra fluids around exercise times first  Increase fruit intake by at least one additional serving per week; Increase fluid intake by at least one additional 8oz glass per day, try adding extra fluids around exercise times first      Comment  He drinks mostly zero sugar beverages but does not feel that he drinks enough each day  He has been making an effort to drink more fluids each day. Today in class he had a glass of water with him while exercising. He feels that overall he is "doing better" with his diet and his wife is in charge of the food he eats and the cooking at home, who  has been trying to provide healthier options and use less salt. He has lost a couple of pounds. He has not yet been intentional about eating more fruit      Expected Outcome  He will try to drink at least one more glass of water daily, ideally around exercise sessions  He will work on eating more fruit each week and continue to increase fluid intake. He will work with his wife to have healthier options to eat at home        Personal Goal #2 Re-Evaluation   Personal Goal #2  Increase fruit intake by at least one additional serving per week  -         Nutrition Goals Discharge (Final Nutrition Goals Re-Evaluation): Nutrition Goals Re-Evaluation - 07/12/18 0829      Goals   Nutrition Goal  Increase fruit intake by at least one additional serving per week; Increase fluid intake by at least one additional 8oz glass per day, try adding extra fluids around exercise times first    Comment  He has been making an effort to drink more fluids each day. Today in class he had a glass of water with him while exercising. He feels that overall he is "doing better" with his diet and his wife is in charge of the food he eats and the cooking at home, who has been trying to provide healthier options and use less salt. He has  lost a couple of pounds. He has not yet been intentional about eating more fruit    Expected Outcome  He will work on eating more fruit each week and continue to increase fluid intake. He will work with his wife to have healthier options to eat at home       Psychosocial: Target Goals: Acknowledge presence or absence of significant depression and/or stress, maximize coping skills, provide positive support system. Participant is able to verbalize types and ability to use techniques and skills needed for reducing stress and depression.   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 Jerry's wife is staying with her Mom every night. 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 "That is hard"  Sleeps to about 3 AM, sometimes takes time to get back to sleep until up for the day at 545       Quality of Life Scores:  Quality of Life - 06/06/18 1255      Quality of Life   Select  Quality of Life      Quality of Life Scores   Health/Function Pre  6.1 %    Socioeconomic Pre  26.69 %    Psych/Spiritual Pre  14.29 %    Family Pre  21.6 %    GLOBAL Pre  14.66 %      Scores of 19 and below usually indicate a poorer quality of life in these areas.  A difference of  2-3 points is a clinically meaningful difference.  A difference of 2-3 points in the total score of the Quality of Life Index has been associated with significant improvement in overall quality of life, self-image, physical symptoms, and general health in studies assessing change in quality of life.  PHQ-9: Recent Review Flowsheet Data    Depression screen [facility] 2/9 06/30/2018 06/09/2018 06/06/2018 12/20/2017 12/16/2017   Decreased Interest 0 '1 1 2 2   '$ Down, Depressed, Hopeless '1 1 1 3 3   '$ PHQ - 2 Score '1 2 2 5 5   '$ Altered sleeping '3 1 3 3 3   '$ Tired, decreased energy '1 1 1 3 3   '$ Change in appetite 2 0 0 0 0   Feeling bad or failure about yourself  0 '1 2 1 1   '$ Trouble concentrating 0 0 0 0 0   Moving slowly or fidgety/restless 0 0 0 0 0   Suicidal thoughts 0 0 0 0 0   PHQ-9 Score '7 5 8 12 12   '$ Difficult doing work/chores Somewhat difficult - Somewhat difficult - Not difficult at all     Interpretation of Total Score  Total Score Depression Severity:  1-4 = Minimal depression, 5-9 = Mild depression, 10-14 = Moderate depression, 15-19 = Moderately severe depression, 20-27 = Severe depression   Psychosocial Evaluation and Intervention: Psychosocial Evaluation - 06/14/18 1025       Psychosocial Evaluation & Interventions   Interventions  Stress management education;Encouraged to exercise with the program and follow exercise prescription    Comments  Mr. Bedoya Darrell [name]) has returned to 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  Counselor met with him today for initial psychosocial evaluation.   He is a 72 year old who has a family history of heart  disease.  He has a strong support system with a spouse of 19 years; lots of family locally and active involvement with his local church.  Darrell [name] reports only getting maybe ~5 hours/night sleep.  He has a history of OSA but is no longer using the CPAP once he lost weight.  He has a good appetite.  Darrell [name] denies a history of depression or anxiety or any current symptoms and states he is typically a positive person.  He has multiple stressors currently with his own health; his 51 year old mother-in-law is being cared for by Gerry's spouse; and Darrell [name] is the [place] which can be stressful at times as well.   He has goals to feel better overall; to lose some weight and increase his stamina and strength.  Staff will follow with Darrell [name].    Expected Outcomes  Short:  Darrell [name] will develop some positive coping strategies by attending the educational components of this program and learn to manage his stress better.  He will also meet with the dietician to address his weight loss goals.  Darrell [name] will benefit from exercising consistently for his health and mental health and as a coping strategy.  Long:  Darrell [name] will develop positive self-care habits from this program for his continued health and mental health.      Continue Psychosocial Services   Follow up required by staff       Psychosocial Re-Evaluation: Psychosocial Re-Evaluation    Ashley Name 06/30/18 [phone] 07/21/18 0816           Psychosocial Re-Evaluation   Current issues with  Current Stress Concerns;Current Sleep Concerns  Current Stress Concerns;Current Sleep Concerns      Comments   Darrell [name] will be going to visit his granddaughter for Christmas and will miss three weeks starting in mid [place].  He is looking forward to seeing them on Christmas.  He is not sleeping very well and got an extra 38mn at night.  Not sure what is triggering it.  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  He will need to work about 13 METs to return.   He has not talked to anyone about his sleep and is not  taking anything to help.  He is able to get to sleep its the sleeping long enough that is the problem. PHQ score has gone down by one point.  It was as low as 5 at the doctors office, but it is improving.   JSonia Sidehad a lot of work to get done before his trip. His sleep is not good. He goes to bed at 11:30pm and was waking up around 3:22am. and now sleeping until 5 am, so it has improved some. He does not usually go back to sleep when he wakes in the middle of the night but will try our Relaxation techniques to return to sleep. [place] of his sons house. He will stay active while he is there.      Expected Outcomes  Short: Continue to cope with job stress.  Long: Continue to work on sleep.   Short: Continue to do his jobs within his current capabilites. He is going to alarms but not doing anything active. He tells everyone to stay back. Long:  Continue to work on sleep.      Interventions  Stress management education;Encouraged to attend Cardiac Rehabilitation for the exercise  -      Continue Psychosocial Services   Follow up required by staff  -      Comments  MOther in law in Skilled facility. Jerry's wife is staying with her Mom every night. 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 "That is hard"  Sleeps to about 3 AM, sometimes takes time to get back to sleep until up for the day at 545  -         Initial Review   Source of Stress Concerns  Family;Occupation  -         Psychosocial Discharge (Final Psychosocial Re-Evaluation): Psychosocial Re-Evaluation - 07/21/18 0816      Psychosocial Re-Evaluation   Current issues with  Current Stress Concerns;Current Sleep Concerns    Comments  Darrell [name] had a lot of work to get done before his trip. His sleep is not good. He goes to bed at 11:30pm and was waking up around 3:22am. and now sleeping until 5 am, so it has improved some. He does not usually go back to sleep when he wakes in the middle of the night but will try our Relaxation techniques to return to sleep. He is excited for his trip to [place] and is staying in a hotel in walking distance of his sons house. He will stay active while he is there.    Expected Outcomes  Short: Continue to do his jobs within his current capabilites. He is going to alarms but not doing anything active. He tells everyone to stay back. Long: Continue to work on sleep.       Vocational Rehabilitation: Provide vocational rehab assistance to qualifying candidates.   Vocational Rehab Evaluation & Intervention: Vocational Rehab - 06/06/18 1304      Initial Vocational Rehab Evaluation & Intervention   Assessment shows need for Vocational Rehabilitation  No       Education: Education Goals: Education classes will be provided on a variety of topics geared toward better understanding of heart health and risk factor modification. Participant will state understanding/return demonstration of topics presented as noted by education test scores.  Learning Barriers/Preferences: Learning Barriers/Preferences - 06/06/18 1303      Learning Barriers/Preferences   Learning Barriers  None    Learning Preferences  None       Education Topics:  AED/CPR: - Group verbal and written instruction with the use of models to demonstrate the basic use of the AED with the basic ABC's of resuscitation.   General  Nutrition Guidelines/Fats and Fiber: -Group instruction provided by verbal, written material, models and posters to present the general guidelines for heart healthy nutrition. Gives an explanation and review of dietary fats and fiber.   Cardiac Rehab from 01/04/2018 in [facility] Cardiac and Pulmonary Rehab  Date  11/09/17  Educator  PI  Instruction Review Code  1- Verbalizes Understanding      Controlling Sodium/Reading Food Labels: -Group verbal and written material supporting the discussion of sodium use in heart healthy nutrition. Review and explanation with models, verbal and written materials for utilization of the food label.   Cardiac Rehab from 01/04/2018 in [facility] Cardiac and Pulmonary Rehab  Date  01/04/18  Educator  PI  Instruction Review Code  1- Verbalizes Understanding      Exercise Physiology & General Exercise Guidelines: - Group verbal and written instruction with models to review the  exercise physiology of the cardiovascular system and associated critical values. Provides general exercise guidelines with specific guidelines to those with heart or lung disease.    Cardiac Rehab from 01/04/2018 in [facility] Cardiac and Pulmonary Rehab  Date  11/25/17  Educator  [facility]  Instruction Review Code  1- Verbalizes Understanding      Aerobic Exercise & Resistance Training: - Gives group verbal and written instruction on the various components of exercise. Focuses on aerobic and resistive training programs and the benefits of this training and how to safely progress through these programs..   Flexibility, Balance, Mind/Body Relaxation: Provides group verbal/written instruction on the benefits of flexibility and balance training, including mind/body exercise modes such as yoga, pilates and tai chi.  Demonstration and skill practice provided.   Cardiac Rehab from 07/26/2018 in [facility] Cardiac and Pulmonary Rehab  Date  07/12/18  Educator  AS  Instruction Review Code  1- Verbalizes Understanding       Stress and Anxiety: - Provides group verbal and written instruction about the health risks of elevated stress and causes of high stress.  Discuss the correlation between heart/lung disease and anxiety and treatment options. Review healthy ways to manage with stress and anxiety.   Cardiac Rehab from 01/04/2018 in [facility] Cardiac and Pulmonary Rehab  Date  12/14/17  Educator  [facility]  Instruction Review Code  1- Verbalizes Understanding      Depression: - Provides group verbal and written instruction on the correlation between heart/lung disease and depressed mood, treatment options, and the stigmas associated with seeking treatment.   Cardiac Rehab from 07/26/2018 in [facility] Cardiac and Pulmonary Rehab  Date  07/21/18  Educator  [facility]  Instruction Review Code  1- Verbalizes Understanding      Anatomy & Physiology of the Heart: - Group verbal and written instruction and models provide basic cardiac anatomy and physiology, with the coronary electrical and arterial systems. Review of Valvular disease and Heart Failure   Cardiac Rehab from 01/04/2018 in [facility] Cardiac and Pulmonary Rehab  Date  12/16/17  Educator  CE  Instruction Review Code  1- Verbalizes Understanding      Cardiac Procedures: - Group verbal and written instruction to review commonly prescribed medications for heart disease. Reviews the medication, class of the drug, and [name] effects. Includes the steps to properly store meds and maintain the prescription regimen. (beta blockers and nitrates)   Cardiac Rehab from 07/26/2018 in [facility] Cardiac and Pulmonary Rehab  Date  07/26/18  Educator  SB  Instruction Review Code  1- Verbalizes Understanding      Cardiac Medications I: - Group verbal and written instruction to review commonly prescribed medications for heart disease. Reviews the medication, class of the drug, and [name] effects. Includes the steps to properly store meds and maintain the prescription regimen.   Cardiac Rehab from  01/04/2018 in [facility] Cardiac and Pulmonary Rehab  Date  12/21/17  Educator  SB  Instruction Review Code  1- Verbalizes Understanding      Cardiac Medications II: -Group verbal and written instruction to review commonly prescribed medications for heart disease. Reviews the medication, class of the drug, and [name] effects. (all other drug classes)   Cardiac Rehab from 07/26/2018 in [facility] Cardiac and Pulmonary Rehab  Date  07/19/18  Educator  SB  Instruction Review Code  1- Verbalizes Understanding       Go Sex-Intimacy & Heart Disease, Get SMART - Goal Setting: - Group verbal and written instruction through game format to  discuss heart disease and the return to sexual intimacy. Provides group verbal and written material to discuss and apply goal setting through the application of the S.M.A.R.T. Method.   Cardiac Rehab from 07/26/2018 in [facility] Cardiac and Pulmonary Rehab  Date  07/26/18  Educator  SB  Instruction Review Code  1- Verbalizes Understanding      Other Matters of the Heart: - Provides group verbal, written materials and models to describe Stable Angina and Peripheral Artery. Includes description of the disease process and treatment options available to the cardiac patient.   Cardiac Rehab from 01/04/2018 in [facility] Cardiac and Pulmonary Rehab  Date  12/16/17  Educator  CE  Instruction Review Code  1- Verbalizes Understanding      Exercise & Equipment Safety: - Individual verbal instruction and demonstration of equipment use and safety with use of the equipment.   Cardiac Rehab from 07/26/2018 in [facility] Cardiac and Pulmonary Rehab  Date  06/06/18  Educator  Sn  Instruction Review Code  1- Verbalizes Understanding      Infection Prevention: - Provides verbal and written material to individual with discussion of infection control including proper hand washing and proper equipment cleaning during exercise session.   Cardiac Rehab from 07/26/2018 in [facility] Cardiac and Pulmonary  Rehab  Date  06/06/18  Educator  Sb  Instruction Review Code  1- Verbalizes Understanding      Falls Prevention: - Provides verbal and written material to individual with discussion of falls prevention and safety.   Cardiac Rehab from 07/26/2018 in [facility] Cardiac and Pulmonary Rehab  Date  06/06/18  Educator  Sb  Instruction Review Code  1- Verbalizes Understanding      Diabetes: - Individual verbal and written instruction to review signs/symptoms of diabetes, desired ranges of glucose level fasting, after meals and with exercise. Acknowledge that pre and post exercise glucose checks will be done for 3 sessions at entry of program.   Know Your Numbers and Risk Factors: -Group verbal and written instruction about important numbers in your health.  Discussion of what are risk factors and how they play a role in the disease process.  Review of Cholesterol, Blood Pressure, Diabetes, and BMI and the role they play in your overall health.   Cardiac Rehab from 07/26/2018 in [facility] Cardiac and Pulmonary Rehab  Date  07/19/18  Educator  SB  Instruction Review Code  1- Verbalizes Understanding      Sleep Hygiene: -Provides group verbal and written instruction about how sleep can affect your health.  Define sleep hygiene, discuss sleep cycles and impact of sleep habits. Review good sleep hygiene tips.    Cardiac Rehab from 07/26/2018 in [facility] Cardiac and Pulmonary Rehab  Date  06/28/18  Educator  [facility]  Instruction Review Code  1- Verbalizes Understanding      Other: -Provides group and verbal instruction on various topics (see comments)   Knowledge Questionnaire Score: Knowledge Questionnaire Score - 06/06/18 1303      Knowledge Questionnaire Score   Pre Score  24/26   reviewd correct responses with Darrell [name] todauy. He verbalized understanding of the responses and had no further questions.       Core Components/Risk Factors/Patient Goals at Admission: Personal Goals and Risk Factors at  Admission - 06/06/18 1305      Core Components/Risk Factors/Patient Goals on Admission    Weight Management  Yes;Obesity;Weight Loss    Admit Weight  214 lb (97.1 kg)    Goal Weight: Short Term  212 lb (96.2 kg)    Goal Weight: Long Term  190 lb (86.2 kg)    Expected Outcomes  Short Term: Continue to assess and modify interventions until short term weight is achieved;Long Term: Adherence to nutrition and physical activity/exercise program aimed toward attainment of established weight goal;Weight Loss: Understanding of general recommendations for a balanced deficit meal plan, which promotes 1-2 lb weight loss per week and includes a negative energy balance of [id number] kcal/d    Hypertension  Yes    Intervention  Provide education on lifestyle modifcations including regular physical activity/exercise, weight management, moderate sodium restriction and increased consumption of fresh fruit, vegetables, and low fat dairy, alcohol moderation, and smoking cessation.;Monitor prescription use compliance.    Expected Outcomes  Short Term: Continued assessment and intervention until BP is < 140/13m HG in hypertensive participants. < 130/860mHG in hypertensive participants with diabetes, heart failure or chronic kidney disease.;Long Term: Maintenance of blood pressure at goal levels.    Lipids  Yes    Intervention  Provide education and support for participant on nutrition & aerobic/resistive exercise along with prescribed medications to achieve LDL '70mg'$ , HDL >'40mg'$ .    Expected Outcomes  Short Term: Participant states understanding of desired cholesterol values and is compliant with medications prescribed. Participant is following exercise prescription and nutrition guidelines.;Long Term: Cholesterol controlled with medications as prescribed, with individualized exercise RX and with personalized nutrition plan. Value goals: LDL < '70mg'$ , HDL > 40 mg.       Core Components/Risk Factors/Patient Goals Review:   Goals and Risk Factor Review    Row Name 06/30/18 0812872/05/19 0824           Core Components/Risk Factors/Patient Goals Review   Personal Goals Review  Weight Management/Obesity;Hypertension;Lipids  Weight Management/Obesity;Hypertension;Lipids      Review  Jerry's weight is climbing again.  He is eating but not always what is healthy.  He is doing well with his blood pressures other than the other day when he missed his medication in the morning.  He checks it at home twice a day.  He is doing well on his medications and may adjust his blood pressure meds some as it sometimes is slightly elevated.   We are going to add some more Interval training to try to give him a jump start his weight loss. Blood pressure has been high. He takes his meds regularly. We will send a note with BP's to Dr. KoNehemiah Massedo see if he wants to make meds changes.       Expected Outcomes  Short: Get in more cardio to help with weight loss.  Long: Continue to monitor blood pressures.   Short: Start HIIT on cardio equipment.  Long: Continue to take meds and monitor blood pressure.          Core Components/Risk Factors/Patient Goals at Discharge (Final Review):  Goals and Risk Factor Review - 07/21/18 0824      Core Components/Risk Factors/Patient Goals Review   Personal Goals Review  Weight Management/Obesity;Hypertension;Lipids    Review  We are going to add some more Interval training to try to give him a jump start his weight loss. Blood pressure has been high. He takes his meds regularly. We will send a note with BP's to Dr. KoNehemiah Massedo see if he wants to make meds changes.     Expected Outcomes  Short: Start HIIT on cardio equipment.  Long: Continue to take meds and monitor blood pressure.  ITP Comments: ITP Comments    Row Name 06/06/18 1240 06/22/18 0559 06/28/18 0818 07/20/18 0607 07/27/18 1433   ITP Comments  Medical review completed today. New diagnosis new program start.  ITP sent to Dr Sabra Heck for  review, changes as needed and signature. Documentation of diagnosis can be found in [facility] 9/03 visit  30 day review. Continue with ITP unless direccted changes per Medical Director Chart Review. New to program  Jerry's blood pressure was elevated when he came in this morning (200/102).  He sat for a while and was able to come down to the 170s.  He did the T5 and treadmill today without any problems.   30 day review. Continue with ITP unless direccted changes per Medical Director Chart Review.  Darrell [name] will be out for three weeks going to visit his grands in [place] for Christmas.    [place] Name 08/16/18 0635           ITP Comments  30 Day Review. Continue with ITP unless directed changes per Medical Director review          Comments:

## 2018-08-18 ENCOUNTER — Encounter: Payer: Medicare Other | Attending: Internal Medicine

## 2018-08-18 DIAGNOSIS — Z79899 Other long term (current) drug therapy: Secondary | ICD-10-CM | POA: Insufficient documentation

## 2018-08-18 DIAGNOSIS — I629 Nontraumatic intracranial hemorrhage, unspecified: Secondary | ICD-10-CM | POA: Insufficient documentation

## 2018-08-18 DIAGNOSIS — Z87891 Personal history of nicotine dependence: Secondary | ICD-10-CM | POA: Insufficient documentation

## 2018-08-18 DIAGNOSIS — Z8546 Personal history of malignant neoplasm of prostate: Secondary | ICD-10-CM | POA: Insufficient documentation

## 2018-08-18 DIAGNOSIS — I2510 Atherosclerotic heart disease of native coronary artery without angina pectoris: Secondary | ICD-10-CM | POA: Insufficient documentation

## 2018-08-18 DIAGNOSIS — E785 Hyperlipidemia, unspecified: Secondary | ICD-10-CM | POA: Insufficient documentation

## 2018-08-18 DIAGNOSIS — I10 Essential (primary) hypertension: Secondary | ICD-10-CM | POA: Insufficient documentation

## 2018-08-18 DIAGNOSIS — Z7902 Long term (current) use of antithrombotics/antiplatelets: Secondary | ICD-10-CM | POA: Insufficient documentation

## 2018-08-18 DIAGNOSIS — Z7982 Long term (current) use of aspirin: Secondary | ICD-10-CM | POA: Insufficient documentation

## 2018-08-18 DIAGNOSIS — Z951 Presence of aortocoronary bypass graft: Secondary | ICD-10-CM | POA: Insufficient documentation

## 2018-08-18 DIAGNOSIS — Z7901 Long term (current) use of anticoagulants: Secondary | ICD-10-CM | POA: Insufficient documentation

## 2018-08-18 DIAGNOSIS — L409 Psoriasis, unspecified: Secondary | ICD-10-CM | POA: Insufficient documentation

## 2018-08-19 NOTE — Progress Notes (Signed)
[place]  [phone] [phone]  ID: Darrell [name] OB: 12/27/1945  MR#: [id number]  YQI#:[id number]  Patient Care Team: Guadalupe Maple, MD as PCP - General (Family Medicine) Lucky Cowboy, Erskine Squibb, MD as Referring Physician (Vascular Surgery) Royston Cowper, MD (Urology) Corey Skains, MD as Consulting Physician (Internal Medicine) Dasher, Rayvon Char, MD (Dermatology)  CHIEF COMPLAINT: Ilumya subcutaneous injection for psoriatic arthritis.  INTERVAL HISTORY: Patient returns to clinic today for routine 28-month evaluation.  He continues to feel well and asymptomatic. He denies any recent fevers or illnesses.  He has no neurologic complaints.  He denies any chest pain or shortness of breath.  He has chronic joint pain.  He denies any nausea, vomiting, constipation, or diarrhea.  He has no urinary complaints.  Patient feels at his baseline offers no specific complaints today.  REVIEW OF SYSTEMS:   Review of Systems  Constitutional: Negative.  Negative for fever, malaise/fatigue and weight loss.  Respiratory: Negative.  Negative for cough, hemoptysis and shortness of breath.   Cardiovascular: Negative.  Negative for chest pain and leg swelling.  Gastrointestinal: Negative.  Negative for abdominal pain.  Genitourinary: Negative.  Negative for dysuria.  Musculoskeletal: Positive for joint pain.  Skin: Negative.  Negative for rash.  Neurological: Negative.  Negative for sensory change, focal weakness and weakness.  Psychiatric/Behavioral: Negative.  The patient is not nervous/anxious.     As per HPI. Otherwise, a complete review of systems is negative.  PAST MEDICAL HISTORY: Past Medical History:  Diagnosis Date  . CAD (coronary artery disease)   . Cancer [facility])    prostate  . Carotid artery stenosis   . Hyperlipidemia   . Hypertension   . Psoriasis     PAST SURGICAL HISTORY: Past Surgical History:  Procedure Laterality Date  .  CHOLECYSTECTOMY    . COLONOSCOPY    . COLONOSCOPY WITH PROPOFOL N/A 03/25/2018   Procedure: COLONOSCOPY WITH PROPOFOL;  Surgeon: Jonathon Bellows, MD;  Location: [facility] ENDOSCOPY;  Service: Gastroenterology;  Laterality: N/A;  . CORONARY ANGIOPLASTY WITH STENT PLACEMENT    . CORONARY ARTERY BYPASS GRAFT Right 04/21/2018  . LEFT HEART CATH AND CORONARY ANGIOGRAPHY N/A 05/21/2017   Procedure: LEFT HEART CATH AND CORONARY ANGIOGRAPHY;  Surgeon: Teodoro Spray, MD;  Location: [place] [facility];  Service: Cardiovascular;  Laterality: N/A;  . LIVER BIOPSY  1999  . SHOULDER SURGERY    . stents      FAMILY HISTORY: Family History  Problem Relation Age of Onset  . Heart attack Mother   . Diabetes Mother   . Stroke Mother   . Heart disease Mother   . Diabetes Brother   . Heart Problems Brother   . Diabetes Sister   . Lung cancer Maternal Aunt   . Esophageal cancer Sister     ADVANCED DIRECTIVES (Y/N):  N  HEALTH MAINTENANCE: Social History   Tobacco Use  . Smoking status: Former Smoker    Types: Cigarettes    Last attempt to quit: 1989    Years since quitting: 31.0  . Smokeless tobacco: Never Used  . Tobacco comment: quit 30 years ago   Substance Use Topics  . Alcohol use: Yes    Alcohol/week: 1.0 standard drinks    Types: 1 Cans of beer per week    Comment: occasioanlly   . Drug use: No     Colonoscopy:  PAP:  Bone density:  Lipid panel:  No Known Allergies  Current Outpatient  Medications  Medication Sig Dispense Refill  . allopurinol (ZYLOPRIM) 300 MG tablet Take 0.5 tablets (150 mg total) by mouth daily. 90 tablet 4  . apixaban (ELIQUIS) 5 MG TABS tablet Take by mouth.    Marland Kitchen aspirin EC 81 MG tablet Take by mouth.    Marland Kitchen atorvastatin (LIPITOR) 80 MG tablet Take 80 mg by mouth daily.    . carvedilol (COREG) 6.25 MG tablet Take by mouth.    . hydrochlorothiazide (HYDRODIURIL) 25 MG tablet Take 1 tablet (25 mg total) by mouth daily. 90 tablet 4  . pravastatin (PRAVACHOL) 40  MG tablet Take 1 tablet by mouth 1 day or 1 dose.    . telmisartan (MICARDIS) 80 MG tablet Take by mouth.     No current facility-administered medications for this visit.     OBJECTIVE: Vitals:   08/26/18 1008  BP: (!) 186/82  Pulse: 80  Temp: 97.6 F (36.4 C)     Body mass index is 36.03 kg/m.    ECOG FS:0 - Asymptomatic  General: Well-developed, well-nourished, no acute distress. Eyes: Pink conjunctiva, anicteric sclera. HEENT: Normocephalic, moist mucous membranes. Lungs: Clear to auscultation bilaterally. Heart: Regular rate and rhythm. No rubs, murmurs, or gallops. Abdomen: Soft, nontender, nondistended. No organomegaly noted, normoactive bowel sounds. Musculoskeletal: No edema, cyanosis, or clubbing. Neuro: Alert, answering all questions appropriately. Cranial nerves grossly intact. Skin: No rashes or petechiae noted. Psych: Normal affect.  LAB RESULTS:  Lab Results  Component Value Date   NA 141 04/10/2018   K 3.5 04/10/2018   CL 109 04/10/2018   CO2 26 04/10/2018   GLUCOSE 131 (H) 04/10/2018   BUN 28 (H) 04/10/2018   CREATININE 1.38 (H) 04/10/2018   CALCIUM 8.0 (L) 04/10/2018   PROT 6.5 12/20/2017   ALBUMIN 4.2 12/20/2017   AST 24 12/20/2017   ALT 24 12/20/2017   ALKPHOS 123 (H) 12/20/2017   BILITOT 0.9 12/20/2017   GFRNONAA 50 (L) 04/10/2018   GFRAA 58 (L) 04/10/2018    Lab Results  Component Value Date   WBC 6.0 04/10/2018   NEUTROABS 4.2 12/20/2017   HGB 13.4 04/10/2018   HCT 37.6 (L) 04/10/2018   MCV 93.8 04/10/2018   PLT 127 (L) 04/10/2018     STUDIES: No results found.  ASSESSMENT: Ilumya subcutaneous injection for psoriatic arthritis.  PLAN:    1. Ilumya subcutaneous injection for psoriatic arthritis: Continue maintenance injections every 12 weeks.  Initial plan was for patient to continue injections at home, but the barrier to doing this is unclear.  Proceed with treatment today.  Return to clinic in 3 months for treatment only and  then in 6 months for further evaluation and continuation of Ilumya. He expressed understanding that any questions about his treatment, laboratory work, or his psoriatic arthritis should be referred to Dr. Evorn Gong.    I spent a total of 30 minutes face-to-face with the patient of which greater than 50% of the visit was spent in counseling and coordination of care as detailed above.  Patient expressed understanding and was in agreement with this plan. He also understands that He can call clinic at any time with any questions, concerns, or complaints.    Lloyd Huger, MD   08/26/2018 11:53 AM

## 2018-08-22 DIAGNOSIS — I25810 Atherosclerosis of coronary artery bypass graft(s) without angina pectoris: Secondary | ICD-10-CM | POA: Diagnosis not present

## 2018-08-22 DIAGNOSIS — I480 Paroxysmal atrial fibrillation: Secondary | ICD-10-CM | POA: Diagnosis not present

## 2018-08-22 DIAGNOSIS — E782 Mixed hyperlipidemia: Secondary | ICD-10-CM | POA: Diagnosis not present

## 2018-08-22 DIAGNOSIS — I255 Ischemic cardiomyopathy: Secondary | ICD-10-CM | POA: Diagnosis not present

## 2018-08-22 DIAGNOSIS — G4733 Obstructive sleep apnea (adult) (pediatric): Secondary | ICD-10-CM | POA: Diagnosis not present

## 2018-08-23 ENCOUNTER — Encounter: Payer: Medicare Other | Admitting: *Deleted

## 2018-08-23 ENCOUNTER — Ambulatory Visit: Payer: Medicare Other | Admitting: Family Medicine

## 2018-08-23 DIAGNOSIS — Z951 Presence of aortocoronary bypass graft: Secondary | ICD-10-CM | POA: Diagnosis not present

## 2018-08-23 DIAGNOSIS — Z7982 Long term (current) use of aspirin: Secondary | ICD-10-CM | POA: Diagnosis not present

## 2018-08-23 DIAGNOSIS — Z8546 Personal history of malignant neoplasm of prostate: Secondary | ICD-10-CM | POA: Diagnosis not present

## 2018-08-23 DIAGNOSIS — Z7902 Long term (current) use of antithrombotics/antiplatelets: Secondary | ICD-10-CM | POA: Diagnosis not present

## 2018-08-23 DIAGNOSIS — L409 Psoriasis, unspecified: Secondary | ICD-10-CM | POA: Diagnosis not present

## 2018-08-23 DIAGNOSIS — I629 Nontraumatic intracranial hemorrhage, unspecified: Secondary | ICD-10-CM | POA: Diagnosis not present

## 2018-08-23 DIAGNOSIS — E785 Hyperlipidemia, unspecified: Secondary | ICD-10-CM | POA: Diagnosis not present

## 2018-08-23 DIAGNOSIS — Z79899 Other long term (current) drug therapy: Secondary | ICD-10-CM | POA: Diagnosis not present

## 2018-08-23 DIAGNOSIS — Z7901 Long term (current) use of anticoagulants: Secondary | ICD-10-CM | POA: Diagnosis not present

## 2018-08-23 DIAGNOSIS — I10 Essential (primary) hypertension: Secondary | ICD-10-CM | POA: Diagnosis not present

## 2018-08-23 DIAGNOSIS — Z87891 Personal history of nicotine dependence: Secondary | ICD-10-CM | POA: Diagnosis not present

## 2018-08-23 DIAGNOSIS — I2510 Atherosclerotic heart disease of native coronary artery without angina pectoris: Secondary | ICD-10-CM | POA: Diagnosis not present

## 2018-08-23 NOTE — Progress Notes (Signed)
Daily Session Note  Patient Details  Name: Darrell [name] MRN: [id number] Date of Birth: February 08, 1946 Referring Provider:     Cardiac Rehab from 06/06/2018 in [facility] Cardiac [facility]  Referring Provider  Serafina Royals MD      Encounter Date: 08/23/2018  Check In: Session Check In - 08/23/18 0806      Check-In   Supervising physician immediately available to respond to emergencies  See telemetry face sheet for immediately available ER MD    Location  [facility]-Cardiac & Pulmonary Rehab    Staff Present  Heath Lark, RN, BSN, CCRP;[name] BS, Exercise Physiologist    Medication changes reported      No    Fall or balance concerns reported     No    Warm-up and Cool-down  Performed as group-led instruction    Resistance Training Performed  Yes    VAD Patient?  No    PAD/SET Patient?  No      Pain Assessment   Currently in Pain?  No/denies          Social History   Tobacco Use  Smoking Status Former Smoker  . Types: Cigarettes  . Last attempt to quit: 1989  . Years since quitting: 31.0  Smokeless Tobacco Never Used  Tobacco Comment   quit 30 years ago     Goals Met:  Independence with exercise equipment Exercise tolerated well No report of cardiac concerns or symptoms Strength training completed today  Goals Unmet:  Not Applicable  Comments: Pt able to follow exercise prescription today without complaint.  Will continue to monitor for progression.    Dr. Emily Filbert is Medical Director for [place] and [facility].

## 2018-08-25 ENCOUNTER — Encounter: Payer: Medicare Other | Admitting: *Deleted

## 2018-08-25 DIAGNOSIS — Z79899 Other long term (current) drug therapy: Secondary | ICD-10-CM | POA: Diagnosis not present

## 2018-08-25 DIAGNOSIS — Z7902 Long term (current) use of antithrombotics/antiplatelets: Secondary | ICD-10-CM | POA: Diagnosis not present

## 2018-08-25 DIAGNOSIS — Z951 Presence of aortocoronary bypass graft: Secondary | ICD-10-CM | POA: Diagnosis not present

## 2018-08-25 DIAGNOSIS — Z7982 Long term (current) use of aspirin: Secondary | ICD-10-CM | POA: Diagnosis not present

## 2018-08-25 DIAGNOSIS — Z7901 Long term (current) use of anticoagulants: Secondary | ICD-10-CM | POA: Diagnosis not present

## 2018-08-25 DIAGNOSIS — I2510 Atherosclerotic heart disease of native coronary artery without angina pectoris: Secondary | ICD-10-CM | POA: Diagnosis not present

## 2018-08-25 NOTE — Progress Notes (Signed)
Daily Session Note  Patient Details  Name: Darrell [name] MRN: [id number] Date of Birth: [birth year] Referring Provider:     Cardiac Rehab from 06/06/2018 in [facility] Cardiac [facility]  Referring Provider  Serafina Royals MD      Encounter Date: 08/25/2018  Check In: Session Check In - 08/25/18 0757      Check-In   Supervising physician immediately available to respond to emergencies  See telemetry face sheet for immediately available ER MD    Location  [facility]-Cardiac & Pulmonary Rehab    Staff Present  Jasper Loser BS, Exercise Physiologist;Carroll Enterkin, RN, Levie Heritage, MA, RCEP, CCRP, Exercise Physiologist    Medication changes reported      No    Fall or balance concerns reported     No    Warm-up and Cool-down  Performed as group-led instruction    Resistance Training Performed  Yes    VAD Patient?  No    PAD/SET Patient?  No      Pain Assessment   Currently in Pain?  No/denies          Social History   Tobacco Use  Smoking Status Former Smoker  . Types: Cigarettes  . Last attempt to quit: 1989  . Years since quitting: 31.0  Smokeless Tobacco Never Used  Tobacco Comment   quit 30 years ago     Goals Met:  Independence with exercise equipment Exercise tolerated well No report of cardiac concerns or symptoms Strength training completed today  Goals Unmet:  Not Applicable  Comments: Pt able to follow exercise prescription today without complaint.  Will continue to monitor for progression.    Dr. Emily Filbert is Medical Director for [facility] and [facility].

## 2018-08-26 ENCOUNTER — Inpatient Hospital Stay: Payer: Medicare Other

## 2018-08-26 ENCOUNTER — Inpatient Hospital Stay: Payer: Medicare Other | Attending: Oncology | Admitting: Oncology

## 2018-08-26 ENCOUNTER — Ambulatory Visit: Payer: Medicare Other | Admitting: Oncology

## 2018-08-26 ENCOUNTER — Other Ambulatory Visit: Payer: Self-pay

## 2018-08-26 ENCOUNTER — Ambulatory Visit: Payer: Medicare Other

## 2018-08-26 VITALS — BP 186/82 | HR 80 | Temp 97.6°F | Ht 67.5 in | Wt 233.5 lb

## 2018-08-26 DIAGNOSIS — Z87891 Personal history of nicotine dependence: Secondary | ICD-10-CM | POA: Insufficient documentation

## 2018-08-26 DIAGNOSIS — I2510 Atherosclerotic heart disease of native coronary artery without angina pectoris: Secondary | ICD-10-CM | POA: Diagnosis not present

## 2018-08-26 DIAGNOSIS — Z7982 Long term (current) use of aspirin: Secondary | ICD-10-CM | POA: Insufficient documentation

## 2018-08-26 DIAGNOSIS — I350 Nonrheumatic aortic (valve) stenosis: Secondary | ICD-10-CM | POA: Insufficient documentation

## 2018-08-26 DIAGNOSIS — L4050 Arthropathic psoriasis, unspecified: Secondary | ICD-10-CM | POA: Insufficient documentation

## 2018-08-26 DIAGNOSIS — Z801 Family history of malignant neoplasm of trachea, bronchus and lung: Secondary | ICD-10-CM | POA: Insufficient documentation

## 2018-08-26 DIAGNOSIS — I10 Essential (primary) hypertension: Secondary | ICD-10-CM | POA: Insufficient documentation

## 2018-08-26 DIAGNOSIS — E785 Hyperlipidemia, unspecified: Secondary | ICD-10-CM | POA: Diagnosis not present

## 2018-08-26 DIAGNOSIS — Z7901 Long term (current) use of anticoagulants: Secondary | ICD-10-CM | POA: Diagnosis not present

## 2018-08-26 DIAGNOSIS — Z79899 Other long term (current) drug therapy: Secondary | ICD-10-CM | POA: Diagnosis not present

## 2018-08-26 DIAGNOSIS — Z800 Family history of malignant neoplasm of digestive organs: Secondary | ICD-10-CM | POA: Diagnosis not present

## 2018-08-26 DIAGNOSIS — C61 Malignant neoplasm of prostate: Secondary | ICD-10-CM | POA: Insufficient documentation

## 2018-08-26 DIAGNOSIS — M2550 Pain in unspecified joint: Secondary | ICD-10-CM | POA: Insufficient documentation

## 2018-08-26 MED ORDER — TILDRAKIZUMAB-ASMN 100 MG/ML ~~LOC~~ SOSY
100.0000 mg | PREFILLED_SYRINGE | Freq: Once | SUBCUTANEOUS | Status: AC
  Administered 2018-08-26: 100 mg via SUBCUTANEOUS
  Filled 2018-08-26: qty 1

## 2018-08-26 NOTE — Progress Notes (Signed)
Patient is here today to follow up on his Psoriatic arthritis and injection. Patient stated that he was doing well with no complaints.

## 2018-08-29 ENCOUNTER — Encounter: Payer: Self-pay | Admitting: *Deleted

## 2018-08-29 NOTE — Progress Notes (Signed)
I left Darrell [name] [name]) a vm that Dr. [name] saw the info that I faxed him about his heart on Aug 25, 2018 (bigemny but was on the phone at the time).[name] news that Darrell [name] doesn't feel any PVC.s Dr. [name] wrote "No changes".

## 2018-08-29 NOTE — Progress Notes (Signed)
Cardiac Individual Treatment Plan  Patient Details  Name: Darrell [name] MRN: [id number] Date of Birth: 09/08/1945 Referring Provider:     Cardiac Rehab from 06/06/2018 in [facility] Cardiac and Pulmonary Rehab  Referring Provider  Serafina Royals MD      Initial Encounter Date:    Cardiac Rehab from 06/06/2018 in [facility] Cardiac and Pulmonary Rehab  Date  06/06/18      Visit Diagnosis: No diagnosis found.  Patient's Home Medications on Admission:  Current Outpatient Medications:  .  allopurinol (ZYLOPRIM) 300 MG tablet, Take 0.5 tablets (150 mg total) by mouth daily., Disp: 90 tablet, Rfl: 4 .  apixaban (ELIQUIS) 5 MG TABS tablet, Take by mouth., Disp: , Rfl:  .  aspirin EC 81 MG tablet, Take by mouth., Disp: , Rfl:  .  atorvastatin (LIPITOR) 80 MG tablet, Take 80 mg by mouth daily., Disp: , Rfl:  .  carvedilol (COREG) 6.25 MG tablet, Take by mouth., Disp: , Rfl:  .  hydrochlorothiazide (HYDRODIURIL) 25 MG tablet, Take 1 tablet (25 mg total) by mouth daily., Disp: 90 tablet, Rfl: 4 .  pravastatin (PRAVACHOL) 40 MG tablet, Take 1 tablet by mouth 1 day or 1 dose., Disp: , Rfl:  .  telmisartan (MICARDIS) 80 MG tablet, Take by mouth., Disp: , Rfl:   Past Medical History: Past Medical History:  Diagnosis Date  . CAD (coronary artery disease)   . Cancer [facility])    prostate  . Carotid artery stenosis   . Hyperlipidemia   . Hypertension   . Psoriasis     Tobacco Use: Social History   Tobacco Use  Smoking Status Former Smoker  . Types: Cigarettes  . Last attempt to quit: 1989  . Years since quitting: 31.0  Smokeless Tobacco Never Used  Tobacco Comment   quit 30 years ago     Labs: Recent Review Flowsheet Data    Labs for ITP Cardiac and Pulmonary Rehab Latest Ref Rng & Units 11/18/2016 12/17/2016 05/20/2017 06/09/2017 12/20/2017   Cholestrol 100 - 199 mg/dL 112 104 - 106 134   LDLCALC 0 - 99 mg/dL - 39 - 42 44   HDL >39 mg/dL - 41 - 41 39(L)   Trlycerides 0 - 149 mg/dL 264(H)  122 - 114 257(H)   Hemoglobin A1c 4.8 - 5.6 % - 6.0 5.6 - -       Exercise Target Goals: Exercise Program Goal: Individual exercise prescription set using results from initial 6 min walk test and THRR while considering  patient's activity barriers and safety.   Exercise Prescription Goal: Initial exercise prescription builds to 30-45 minutes a day of aerobic activity, 2-3 days per week.  Home exercise guidelines will be given to patient during program as part of exercise prescription that the participant will acknowledge.  Activity Barriers & Risk Stratification: Activity Barriers & Cardiac Risk Stratification - 06/06/18 1252      Activity Barriers & Cardiac Risk Stratification   Activity Barriers  Deconditioning;Muscular Weakness    Cardiac Risk Stratification  High       6 Minute Walk: 6 Minute Walk    Row Name 06/06/18 1348         6 Minute Walk   Phase  Initial     Distance  1520 feet     Walk Time  6 minutes     # of Rest Breaks  0     MPH  2.88     METS  2.91  RPE  11     VO2 Peak  10.18     Symptoms  No     Resting HR  74 bpm     Resting BP  108/64     Resting Oxygen Saturation   97 %     Exercise Oxygen Saturation  during 6 min walk  98 %     Max Ex. HR  115 bpm     Max Ex. BP  174/76     2 Minute Post BP  126/62        Oxygen Initial Assessment:   Oxygen Re-Evaluation:   Oxygen Discharge (Final Oxygen Re-Evaluation):   Initial Exercise Prescription: Initial Exercise Prescription - 06/06/18 1300      Date of Initial Exercise RX and Referring Provider   Date  06/06/18    Referring Provider  Serafina Royals MD      Treadmill   MPH  2.3    Grade  0.5    Minutes  15    METs  2.93      Elliptical   Level  1    Speed  3    Minutes  15      T5 Nustep   Level  3    SPM  80    Minutes  15    METs  3      Prescription Details   Frequency (times per week)  2    Duration  Progress to 30 minutes of continuous aerobic without  signs/symptoms of physical distress      Intensity   THRR 40-80% of Max Heartrate  104-135    Ratings of Perceived Exertion  11-13    Perceived Dyspnea  0-4      Progression   Progression  Continue to progress workloads to maintain intensity without signs/symptoms of physical distress.      Resistance Training   Training Prescription  Yes    Weight  4 lbs    Reps  10-15       Perform Capillary Blood Glucose checks as needed.  Exercise Prescription Changes: Exercise Prescription Changes    Row Name 06/06/18 1200 06/14/18 1000 06/28/18 1600 07/11/18 1600 07/27/18 1400     Response to Exercise   Blood Pressure (Admit)  108/64  136/60  200/100 164/84  138/78  122/72   Blood Pressure (Exercise)  174/76  140/62  170/78  132/70  160/70   Blood Pressure (Exit)  126/62  126/70  146/80  126/70  118/70   Heart Rate (Admit)  74 bpm  65 bpm  48 bpm  72 bpm  59 bpm   Heart Rate (Exercise)  115 bpm  95 bpm  110 bpm  103 bpm  118 bpm   Heart Rate (Exit)  85 bpm  64 bpm  85 bpm  78 bpm  52 bpm   Oxygen Saturation (Admit)  97 %  -  -  -  -   Oxygen Saturation (Exercise)  98 %  -  -  -  -   Rating of Perceived Exertion (Exercise)  11  10  10  11  11    Symptoms  none  none  none  none  none   Comments  walk test results  first full day of exercise  second full day of exercise  -  -   Duration  -  Continue with 30 min of aerobic exercise without signs/symptoms of physical distress.  Continue with 30 min of  aerobic exercise without signs/symptoms of physical distress.  Continue with 30 min of aerobic exercise without signs/symptoms of physical distress.  Continue with 30 min of aerobic exercise without signs/symptoms of physical distress.   Intensity  -  THRR unchanged  THRR unchanged  THRR unchanged  THRR unchanged     Progression   Progression  -  Continue to progress workloads to maintain intensity without signs/symptoms of physical distress.  Continue to progress workloads to maintain intensity  without signs/symptoms of physical distress.  Continue to progress workloads to maintain intensity without signs/symptoms of physical distress.  Continue to progress workloads to maintain intensity without signs/symptoms of physical distress.   Average METs  -  2.9  2.96  3.66  3.41     Resistance Training   Training Prescription  -  Yes  Yes  Yes  Yes   Weight  -  4 lbs  4 lbs  4 lbs  4 lbs   Reps  -  10-15  10-15  10-15  10-15     Interval Training   Interval Training  -  No  No  No  No     Treadmill   MPH  -  -  2.8  2.8  2.8   Grade  -  -  2  2  2    Minutes  -  -  15  15  15    METs  -  -  3.92  3.92  3.92     Elliptical   Level  -  1  1  1  5    Speed  -  3  3  4   4.5   Minutes  -  15  15  15  15      T5 Nustep   Level  -  3  3  5  5    Minutes  -  15  15  15  15    METs  -  2.9  2  3.4  2.9     Home Exercise Plan   Plans to continue exercise at  -  [employer] (comment) walking, [employer], [place] (comment) walking, [employer], [place] (comment) walking, [employer], [place] (comment) walking, [employer], firehouse gym   Frequency  -  Add 3 additional days to program exercise sessions.  Add 3 additional days to program exercise sessions.  Add 3 additional days to program exercise sessions.  Add 3 additional days to program exercise sessions.   Initial Home Exercises Provided  -  06/14/18  06/14/18  06/14/18  06/14/18   Row Name 08/23/18 1600             Response to Exercise   Blood Pressure (Admit)  140/80       Blood Pressure (Exercise)  164/82       Blood Pressure (Exit)  132/84       Heart Rate (Admit)  62 bpm       Heart Rate (Exercise)  98 bpm       Heart Rate (Exit)  68 bpm       Rating of Perceived Exertion (Exercise)  11       Symptoms  none       Duration  Continue with 30 min of aerobic exercise without signs/symptoms of physical distress.       Intensity   THRR unchanged         Progression  Progression  Continue to progress workloads to maintain intensity without signs/symptoms of physical distress.       Average METs  3.21         Resistance Training   Training Prescription  Yes       Weight  4 lbs       Reps  10-15         Interval Training   Interval Training  No         Treadmill   MPH  2.8       Grade  2       Minutes  15       METs  3.92         T5 Nustep   Level  5       Minutes  15       METs  2.5          Exercise Comments: Exercise Comments    Row Name 06/14/18 0917           Exercise Comments  First full day of exercise!  Patient was oriented to gym and equipment including functions, settings, policies, and procedures.  Patient's individual exercise prescription and treatment plan were reviewed.  All starting workloads were established based on the results of the 6 minute walk test done at initial orientation visit.  The plan for exercise progression was also introduced and progression will be customized based on patient's performance and goals.          Exercise Goals and Review: Exercise Goals    Row Name 06/06/18 1351             Exercise Goals   Increase Physical Activity  Yes       Intervention  Provide advice, education, support and counseling about physical activity/exercise needs.;Develop an individualized exercise prescription for aerobic and resistive training based on initial evaluation findings, risk stratification, comorbidities and participant's personal goals.       Expected Outcomes  Short Term: Attend rehab on a regular basis to increase amount of physical activity.;Long Term: Add in home exercise to make exercise part of routine and to increase amount of physical activity.;Long Term: Exercising regularly at least 3-5 days a week.       Increase Strength and Stamina  Yes       Intervention  Provide advice, education, support and counseling about physical activity/exercise needs.;Develop  an individualized exercise prescription for aerobic and resistive training based on initial evaluation findings, risk stratification, comorbidities and participant's personal goals.       Expected Outcomes  Short Term: Perform resistance training exercises routinely during rehab and add in resistance training at home;Long Term: Improve cardiorespiratory fitness, muscular endurance and strength as measured by increased METs and functional capacity (6MWT);Short Term: Increase workloads from initial exercise prescription for resistance, speed, and METs.       Able to understand and use rate of perceived exertion (RPE) scale  Yes       Intervention  Provide education and explanation on how to use RPE scale       Expected Outcomes  Short Term: Able to use RPE daily in rehab to express subjective intensity level;Long Term:  Able to use RPE to guide intensity level when exercising independently       Knowledge and understanding of Target Heart Rate Range (THRR)  Yes       Intervention  Provide education and explanation of THRR including how the numbers were predicted  and where they are located for reference       Expected Outcomes  Short Term: Able to state/look up THRR;Long Term: Able to use THRR to govern intensity when exercising independently;Short Term: Able to use daily as guideline for intensity in rehab       Able to check pulse independently  Yes       Intervention  Provide education and demonstration on how to check pulse in carotid and radial arteries.;Review the importance of being able to check your own pulse for safety during independent exercise       Expected Outcomes  Short Term: Able to explain why pulse checking is important during independent exercise;Long Term: Able to check pulse independently and accurately       Understanding of Exercise Prescription  Yes       Intervention  Provide education, explanation, and written materials on patient's individual exercise prescription        Expected Outcomes  Short Term: Able to explain program exercise prescription;Long Term: Able to explain home exercise prescription to exercise independently          Exercise Goals Re-Evaluation : Exercise Goals Re-Evaluation    Row Name 06/14/18 0917 06/14/18 1024 06/28/18 1618 06/30/18 0805 07/11/18 1634     Exercise Goal Re-Evaluation   Exercise Goals Review  Increase Physical Activity;Increase Strength and Stamina;Able to understand and use rate of perceived exertion (RPE) scale;Knowledge and understanding of Target Heart Rate Range (THRR);Understanding of Exercise Prescription  Increase Physical Activity;Increase Strength and Stamina;Understanding of Exercise Prescription  Increase Physical Activity;Increase Strength and Stamina;Understanding of Exercise Prescription  Increase Physical Activity;Increase Strength and Stamina;Understanding of Exercise Prescription  Increase Physical Activity;Increase Strength and Stamina;Understanding of Exercise Prescription   Comments  Reviewed RPE scale, THR and program prescription with pt today.  Pt voiced understanding and was given a copy of goals to take home.   Reviewed home exercise guidelines with [name].  He will continue to go to [employer] and walk.  He also has access to gym at fire house.   [name] is off to a good start in rehab.  He has completed his second full day of exercise. Today, he had some issues with his blood pressure being high coming in the door, but was able to exercise.  We will continue to monitor his progression.   [name] is doing well in rehab.  He has been walking around town as they wouldn't let him go to gym.  We talked about just going in to do his cardio and staying off the weight equipment.  He recovered from surgery quickly, but is leveling out on his strength now.  He has not had any afib symptoms and just a little pain at the insicion site.    [name] continues to do well in rehab. He comes when his work schedule allows, so he  bounces around the different classes.  He is up to level 5 on the T5 NuStep.  We will continue to monitor his progression.    Expected Outcomes  Short: Use RPE daily to regulate intensity. Long: Follow program prescription in THR.  Short: Continue to exericse more regularly.  Long: Continue to increase activity and stamina.   Short: Attend class regularly.  Long: Continue to exercies independently on off days.   Short: Start adding in more cardio at the gym.  Long: Continue to stay active on off days.   Short: Continue to increase workloads.  Long: Continue to add in cardio on  off days.    [place] Name 07/21/18 2542 07/27/18 1434 08/09/18 0959 08/23/18 1610       Exercise Goal Re-Evaluation   Exercise Goals Review  Increase Physical Activity;Increase Strength and Stamina;Able to check pulse independently  Increase Physical Activity;Increase Strength and Stamina;Able to check pulse independently  -  Increase Physical Activity;Increase Strength and Stamina;Able to check pulse independently    Comments  [name] continues do very well in rehab. He is doing cardio walking and using the Elliptical at [employer]. He is not using weights there. He is walking in the parade this weekend.  [name] is up to level on the elliptical now.  He continues to do well.  He is planning to walk while out on vacation to [place].  We will continue to monitor his progress.   Out since last review  [name] returned today after his trip to [place].  He enjoyed his trip and was able to return to his normal workloads.  We will continue to monitor his progress.     Expected Outcomes  Short: Continue to workout 2-3 extra days. Long: [name] would like to add weight machines back to his workout regimen. Start with 15 lbs and work up slowly.  Short: Walk while out on vacation.  Long: Continue to regain strength back.   -  Short: Attend classes regularly again.  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        Discharge Exercise  Prescription (Final Exercise Prescription Changes): Exercise Prescription Changes - 08/23/18 1600      Response to Exercise   Blood Pressure (Admit)  140/80    Blood Pressure (Exercise)  164/82    Blood Pressure (Exit)  132/84    Heart Rate (Admit)  62 bpm    Heart Rate (Exercise)  98 bpm    Heart Rate (Exit)  68 bpm    Rating of Perceived Exertion (Exercise)  11    Symptoms  none    Duration  Continue with 30 min of aerobic exercise without signs/symptoms of physical distress.    Intensity  THRR unchanged      Progression   Progression  Continue to progress workloads to maintain intensity without signs/symptoms of physical distress.    Average METs  3.21      Resistance Training   Training Prescription  Yes    Weight  4 lbs    Reps  10-15      Interval Training   Interval Training  No      Treadmill   MPH  2.8    Grade  2    Minutes  15    METs  3.92      T5 Nustep   Level  5    Minutes  15    METs  2.5       Nutrition:  Target Goals: Understanding of nutrition guidelines, daily intake of sodium <1540m, cholesterol <2082m calories 30% from fat and 7% or less from saturated fats, daily to have 5 or more servings of fruits and vegetables.  Biometrics: Pre Biometrics - 06/06/18 1353      Pre Biometrics   Height  5' 8.1" (1.73 m)  (Pended)     Weight  222 lb 11.2 oz (101 kg)  (Pended)     Waist Circumference  42 inches  (Pended)     Hip Circumference  43 inches  (Pended)     Waist to Hip Ratio  0.98 %  (Pended)  BMI (Calculated)  33.75  (Pended)     Single Leg Stand  30 seconds  (Pended)         Nutrition Therapy Plan and Nutrition Goals: Nutrition Therapy & Goals - 06/14/18 1007      Nutrition Therapy   Diet  DASH    Drug/Food Interactions  Statins/Certain Fruits    Protein (specify units)  9oz    Fiber  30 grams    Whole Grain Foods  3 servings   chooses whole grains half of the time   Saturated Fats  16 max. grams    Fruits and Vegetables  5  servings/day   8 ideal; eats vegetables regularly but not fruits   Sodium  1500 grams      Personal Nutrition Goals   Nutrition Goal  Increase fruit intake by at least one additional serving per week    Personal Goal #2  Increase fluid intake by at least one additional 8oz glass per day. Try adding extra fluids around exercise times first    Comments  His wife has been helping to change his diet by cooking without salt, buying low sodium products and decreasing portion sizes at meal times. She provides vegetables and whole grain choices regularly. They do not eat out often but he does choose fried foods when eating out occasionally. They eat a variety of protein sources and they do not fry foods at home. They ask for french fries without salt when eating at [facility]. Eats 3 meals/day; breakfast: cheerios with 2% milk, lunch: leftovers, sandwich, [employer], dinner: varies (soups, roast, chicken, shrimp, wheat pasta, brown rice, beef tips, salads, vegetables, baked potato). He drinks mostly water with the occasional sweet tea or diluted orange juice. He does not eat fruit regularly but does eat vegetables that his wife cooks      [place], educate and counsel regarding individualized specific dietary modifications aiming towards targeted core components such as weight, hypertension, lipid management, diabetes, heart failure and other comorbidities.    Expected Outcomes  Short Term Goal: Understand basic principles of dietary content, such as calories, fat, sodium, cholesterol and nutrients.;Short Term Goal: A plan has been developed with personal nutrition goals set during dietitian appointment.;Long Term Goal: Adherence to prescribed nutrition plan.       Nutrition Assessments: Nutrition Assessments - 06/06/18 1302      MEDFICTS Scores   Pre Score  42       Nutrition Goals Re-Evaluation: Nutrition Goals Re-Evaluation    Row Name 06/14/18 1012 07/12/18 0829  08/23/18 0835         Goals   Nutrition Goal  Increase fluid intake by at least one additional 8oz glass per day. Try adding extra fluids around exercise times first  Increase fruit intake by at least one additional serving per week; Increase fluid intake by at least one additional 8oz glass per day, try adding extra fluids around exercise times first  Increase fruit intake by at least one additional serving per week; Increase fluid intake by at least one additional 8oz glass/day     Comment  He drinks mostly zero sugar beverages but does not feel that he drinks enough each day  He has been making an effort to drink more fluids each day. Today in class he had a glass of water with him while exercising. He feels that overall he is "doing better" with his diet and his wife is in charge of the food he  eats and the cooking at home, who has been trying to provide healthier options and use less salt. He has lost a couple of pounds. He has not yet been intentional about eating more fruit  He reports to have been out of his regular routine during the holidays and ate some foods that he does not typically eat. He has continued to drink more fluids but has not eaten many more fruits. He and his wife plan to get back into the swing of preparing heart-healthy meals     Expected Outcome  He will try to drink at least one more glass of water daily, ideally around exercise sessions  He will work on eating more fruit each week and continue to increase fluid intake. He will work with his wife to have healthier options to eat at home  He will maintain habit of drinking more fluids. Continue to prepare heart healthy meals at home. Long term goal: eat more fruit       Personal Goal #2 Re-Evaluation   Personal Goal #2  Increase fruit intake by at least one additional serving per week  -  -        Nutrition Goals Discharge (Final Nutrition Goals Re-Evaluation): Nutrition Goals Re-Evaluation - 08/23/18 0835      Goals    Nutrition Goal  Increase fruit intake by at least one additional serving per week; Increase fluid intake by at least one additional 8oz glass/day    Comment  He reports to have been out of his regular routine during the holidays and ate some foods that he does not typically eat. He has continued to drink more fluids but has not eaten many more fruits. He and his wife plan to get back into the swing of preparing heart-healthy meals    Expected Outcome  He will maintain habit of drinking more fluids. Continue to prepare heart healthy meals at home. Long term goal: eat more fruit       Psychosocial: Target Goals: Acknowledge presence or absence of significant depression and/or stress, maximize coping skills, provide positive support system. Participant is able to verbalize types and ability to use techniques and skills needed for reducing stress and depression.   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 Jerry's wife is staying with her Mom every night. 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 "That is hard"  Sleeps to about 3 AM, sometimes takes time to get back to sleep until up for the day at 545       Quality of Life Scores:  Quality of Life - 06/06/18 1255      Quality of Life   Select  Quality of Life      Quality of Life Scores   Health/Function Pre  6.1 %    Socioeconomic Pre  26.69 %    Psych/Spiritual Pre  14.29 %    Family Pre  21.6 %    GLOBAL Pre  14.66 %      Scores of 19 and below usually indicate a poorer quality of life in these areas.  A difference of  2-3 points is a clinically meaningful difference.  A difference of 2-3 points in the total score of the Quality of Life Index has  been associated with significant improvement in overall  quality of life, self-image, physical symptoms, and general health in studies assessing change in quality of life.  PHQ-9: Recent Review Flowsheet Data    Depression screen [facility] 2/9 06/30/2018 06/09/2018 06/06/2018 12/20/2017 12/16/2017   Decreased Interest 0 1 1 2 2    Down, Depressed, Hopeless 1 1 1 3 3    PHQ - 2 Score 1 2 2 5 5    Altered sleeping 3 1 3 3 3    Tired, decreased energy 1 1 1 3 3    Change in appetite 2 0 0 0 0   Feeling bad or failure about yourself  0 1 2 1 1    Trouble concentrating 0 0 0 0 0   Moving slowly or fidgety/restless 0 0 0 0 0   Suicidal thoughts 0 0 0 0 0   PHQ-9 Score 7 5 8 12 12    Difficult doing work/chores Somewhat difficult - Somewhat difficult - Not difficult at all     Interpretation of Total Score  Total Score Depression Severity:  1-4 = Minimal depression, 5-9 = Mild depression, 10-14 = Moderate depression, 15-19 = Moderately severe depression, 20-27 = Severe depression   Psychosocial Evaluation and Intervention: Psychosocial Evaluation - 06/14/18 1025      Psychosocial Evaluation & Interventions   Interventions  Stress management education;Encouraged to exercise with the program and follow exercise prescription    Comments  Mr. 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  Counselor met with him today for initial psychosocial evaluation.   He is a 73 year old who has a family history of heart disease.  He has a strong support system with a spouse of 40 years; lots of family locally and active involvement with his local church.  Ronald Pippins reports only getting maybe ~5 hours/night sleep.  He has a history of OSA but is no longer using the CPAP once he lost weight.  He has a good appetite.  Ronald Pippins denies a history of depression or anxiety or any current symptoms and states he is typically a positive person.  He has multiple stressors currently with his own health; his 62 year old mother-in-law is being cared for by Gerry's spouse;  and Ronald Pippins is the [place] which can be stressful at times as well.   He has goals to feel better overall; to lose some weight and increase his stamina and strength.  Staff will follow with Ronald Pippins.    Expected Outcomes  Short:  Ronald Pippins will develop some positive coping strategies by attending the educational components of this program and learn to manage his stress better.  He will also meet with the dietician to address his weight loss goals.  Ronald Pippins will benefit from exercising consistently for his health and mental health and as a coping strategy.  Long:  Ronald Pippins will develop positive self-care habits from this program for his continued health and mental health.      Continue Psychosocial Services   Follow up required by staff       Psychosocial Re-Evaluation: Psychosocial Re-Evaluation    [place] Name 06/30/18 [id number] 07/21/18 0816           Psychosocial Re-Evaluation   Current issues with  Current Stress Concerns;Current Sleep Concerns  Current Stress Concerns;Current Sleep Concerns      Comments  [name] will be going to visit his granddaughter for Christmas and will miss three weeks starting in mid [place].  He is looking forward  to seeing them on Christmas.  He is not sleeping very well and got an extra 29mn at night.  Not sure what is triggering it.  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  He will need to work about 13 METs to return.   He has not talked to anyone about his sleep and is not  taking anything to help.  He is able to get to sleep its the sleeping long enough that is the problem. PHQ score has gone down by one point.  It was as low as 5 at the doctors office, but it is improving.   JSonia Sidehad a lot of work to get done before his trip. His sleep is not good. He goes to bed at 11:30pm and was waking up around 3:22am. and now sleeping until 5 am,  so it has improved some. He does not usually go back to sleep when he wakes in the middle of the night but will try our Relaxation techniques to return to sleep. [place] of [place] sons house. He will stay active while he is there.      Expected Outcomes  Short: Continue to cope with job stress.  Long: Continue to work on sleep.   Short: Continue to do his jobs within his current capabilites. He is going to alarms but not doing anything active. He tells everyone to stay back. Long: Continue to work on sleep.      Interventions  Stress management education;Encouraged to attend Cardiac Rehabilitation for the exercise  -      Continue Psychosocial Services   Follow up required by staff  -      Comments  MOther in law in Skilled facility. Jerry's wife is staying with her Mom every night. 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 "That is hard"  Sleeps to about 3 AM, sometimes takes time to get back to sleep until up for the day at 545  -        Initial Review   Source of Stress Concerns  Family;Occupation  -         Psychosocial Discharge (Final Psychosocial Re-Evaluation): Psychosocial Re-Evaluation - 07/21/18 0816      Psychosocial Re-Evaluation   Current issues with  Current Stress Concerns;Current Sleep Concerns    Comments  JSonia Sidehad a lot of work to get done before his trip. His sleep is not good. He goes to bed at 11:30pm and was waking up around 3:22am. and now sleeping until 5 am, so it has improved some. He does not usually go back to sleep when he wakes in the middle of the night but will try our Relaxation techniques to return to sleep. [place] of [place] sons house. He will stay active while he is there.    Expected Outcomes  Short: Continue to do his jobs within his current capabilites. He is going to alarms but not doing anything active. He  tells everyone to stay back. Long: Continue to work on sleep.       Vocational Rehabilitation: Provide vocational rehab assistance to qualifying candidates.   Vocational Rehab Evaluation & Intervention: Vocational Rehab -  06/06/18 1304      Initial Vocational Rehab Evaluation & Intervention   Assessment shows need for Vocational Rehabilitation  No       Education: Education Goals: Education classes will be provided on a variety of topics geared toward better understanding of heart health and risk factor modification. Participant will state understanding/return demonstration of topics presented as noted by education test scores.  Learning Barriers/Preferences: Learning Barriers/Preferences - 06/06/18 1303      Learning Barriers/Preferences   Learning Barriers  None    Learning Preferences  None       Education Topics:  AED/CPR: - Group verbal and written instruction with the use of models to demonstrate the basic use of the AED with the basic ABC's of resuscitation.   General Nutrition Guidelines/Fats and Fiber: -Group instruction provided by verbal, written material, models and posters to present the general guidelines for heart healthy nutrition. Gives an explanation and review of dietary fats and fiber.   Cardiac Rehab from 01/04/2018 in [facility] Cardiac and Pulmonary Rehab  Date  11/09/17  Educator  PI  Instruction Review Code  1- Verbalizes Understanding      Controlling Sodium/Reading Food Labels: -Group verbal and written material supporting the discussion of sodium use in heart healthy nutrition. Review and explanation with models, verbal and written materials for utilization of the food label.   Cardiac Rehab from 01/04/2018 in [facility] Cardiac and Pulmonary Rehab  Date  01/04/18  Educator  PI  Instruction Review Code  1- Verbalizes Understanding      Exercise Physiology & General Exercise Guidelines: - Group verbal and written instruction with models to review the  exercise physiology of the cardiovascular system and associated critical values. Provides general exercise guidelines with specific guidelines to those with heart or lung disease.    Cardiac Rehab from 01/04/2018 in [facility] Cardiac and Pulmonary Rehab  Date  11/25/17  Educator  [facility]  Instruction Review Code  1- Verbalizes Understanding      Aerobic Exercise & Resistance Training: - Gives group verbal and written instruction on the various components of exercise. Focuses on aerobic and resistive training programs and the benefits of this training and how to safely progress through these programs..   Flexibility, Balance, Mind/Body Relaxation: Provides group verbal/written instruction on the benefits of flexibility and balance training, including mind/body exercise modes such as yoga, pilates and tai chi.  Demonstration and skill practice provided.   Cardiac Rehab from 08/25/2018 in [facility] Cardiac and Pulmonary Rehab  Date  07/12/18  Educator  AS  Instruction Review Code  1- Verbalizes Understanding      Stress and Anxiety: - Provides group verbal and written instruction about the health risks of elevated stress and causes of high stress.  Discuss the correlation between heart/lung disease and anxiety and treatment options. Review healthy ways to manage with stress and anxiety.   Cardiac Rehab from 01/04/2018 in [facility] Cardiac and Pulmonary Rehab  Date  12/14/17  Educator  [facility]  Instruction Review Code  1- Verbalizes Understanding      Depression: - Provides group verbal and written instruction on the correlation between heart/lung disease and depressed mood, treatment options, and the stigmas associated with seeking treatment.   Cardiac Rehab from 08/25/2018 in [facility] Cardiac and Pulmonary Rehab  Date  08/23/18  Educator  [facility]  Instruction Review Code  1- Verbalizes Understanding      Anatomy & Physiology of the Heart: - Group verbal and written instruction and models provide basic  cardiac anatomy  and physiology, with the coronary electrical and arterial systems. Review of Valvular disease and Heart Failure   Cardiac Rehab from 01/04/2018 in [facility] Cardiac and Pulmonary Rehab  Date  12/16/17  Educator  CE  Instruction Review Code  1- Verbalizes Understanding      Cardiac Procedures: - Group verbal and written instruction to review commonly prescribed medications for heart disease. Reviews the medication, class of the drug, and side effects. Includes the steps to properly store meds and maintain the prescription regimen. (beta blockers and nitrates)   Cardiac Rehab from 08/25/2018 in [facility] Cardiac and Pulmonary Rehab  Date  07/26/18  Educator  SB  Instruction Review Code  1- Verbalizes Understanding      Cardiac Medications I: - Group verbal and written instruction to review commonly prescribed medications for heart disease. Reviews the medication, class of the drug, and side effects. Includes the steps to properly store meds and maintain the prescription regimen.   Cardiac Rehab from 01/04/2018 in [facility] Cardiac and Pulmonary Rehab  Date  12/21/17  Educator  SB  Instruction Review Code  1- Verbalizes Understanding      Cardiac Medications II: -Group verbal and written instruction to review commonly prescribed medications for heart disease. Reviews the medication, class of the drug, and side effects. (all other drug classes)   Cardiac Rehab from 08/25/2018 in [facility] Cardiac and Pulmonary Rehab  Date  07/19/18  Educator  SB  Instruction Review Code  1- Verbalizes Understanding       Go Sex-Intimacy & Heart Disease, Get SMART - Goal Setting: - Group verbal and written instruction through game format to discuss heart disease and the return to sexual intimacy. Provides group verbal and written material to discuss and apply goal setting through the application of the S.M.A.R.T. Method.   Cardiac Rehab from 08/25/2018 in [facility] Cardiac and Pulmonary Rehab  Date  07/26/18  Educator  SB   Instruction Review Code  1- Verbalizes Understanding      Other Matters of the Heart: - Provides group verbal, written materials and models to describe Stable Angina and Peripheral Artery. Includes description of the disease process and treatment options available to the cardiac patient.   Cardiac Rehab from 01/04/2018 in [facility] Cardiac and Pulmonary Rehab  Date  12/16/17  Educator  CE  Instruction Review Code  1- Verbalizes Understanding      Exercise & Equipment Safety: - Individual verbal instruction and demonstration of equipment use and safety with use of the equipment.   Cardiac Rehab from 08/25/2018 in [facility] Cardiac and Pulmonary Rehab  Date  06/06/18  Educator  Sn  Instruction Review Code  1- Verbalizes Understanding      Infection Prevention: - Provides verbal and written material to individual with discussion of infection control including proper hand washing and proper equipment cleaning during exercise session.   Cardiac Rehab from 08/25/2018 in [facility] Cardiac and Pulmonary Rehab  Date  06/06/18  Educator  Sb  Instruction Review Code  1- Verbalizes Understanding      Falls Prevention: - Provides verbal and written material to individual with discussion of falls prevention and safety.   Cardiac Rehab from 08/25/2018 in [facility] Cardiac and Pulmonary Rehab  Date  06/06/18  Educator  Sb  Instruction Review Code  1- Verbalizes Understanding      Diabetes: - Individual verbal and written instruction to review signs/symptoms of diabetes, desired ranges of glucose level fasting, after meals and with exercise. Acknowledge that pre  and post exercise glucose checks will be done for 3 sessions at entry of program.   Know Your Numbers and Risk Factors: -Group verbal and written instruction about important numbers in your health.  Discussion of what are risk factors and how they play a role in the disease process.  Review of Cholesterol, Blood Pressure, Diabetes, and BMI and the role  they play in your overall health.   Cardiac Rehab from 08/25/2018 in [facility] Cardiac and Pulmonary Rehab  Date  07/19/18  Educator  SB  Instruction Review Code  1- Verbalizes Understanding      Sleep Hygiene: -Provides group verbal and written instruction about how sleep can affect your health.  Define sleep hygiene, discuss sleep cycles and impact of sleep habits. Review good sleep hygiene tips.    Cardiac Rehab from 08/25/2018 in [facility] Cardiac and Pulmonary Rehab  Date  06/28/18  Educator  [facility]  Instruction Review Code  1- Verbalizes Understanding      Other: -Provides group and verbal instruction on various topics (see comments)   Knowledge Questionnaire Score: Knowledge Questionnaire Score - 06/06/18 1303      Knowledge Questionnaire Score   Pre Score  24/26   reviewd correct responses with [name] todauy. He verbalized understanding of the responses and had no further questions.       Core Components/Risk Factors/Patient Goals at Admission: Personal Goals and Risk Factors at Admission - 06/06/18 1305      Core Components/Risk Factors/Patient Goals on Admission    Weight Management  Yes;Obesity;Weight Loss    Admit Weight  214 lb (97.1 kg)    Goal Weight: Short Term  212 lb (96.2 kg)    Goal Weight: Long Term  190 lb (86.2 kg)    Expected Outcomes  Short Term: Continue to assess and modify interventions until short term weight is achieved;Long Term: Adherence to nutrition and physical activity/exercise program aimed toward attainment of established weight goal;Weight Loss: Understanding of general recommendations for a balanced deficit meal plan, which promotes 1-2 lb weight loss per week and includes a negative energy balance of [phone] kcal/d    Hypertension  Yes    Intervention  Provide education on lifestyle modifcations including regular physical activity/exercise, weight management, moderate sodium restriction and increased consumption of fresh fruit, vegetables, and low fat  dairy, alcohol moderation, and smoking cessation.;Monitor prescription use compliance.    Expected Outcomes  Short Term: Continued assessment and intervention until BP is < 140/59m HG in hypertensive participants. < 130/812mHG in hypertensive participants with diabetes, heart failure or chronic kidney disease.;Long Term: Maintenance of blood pressure at goal levels.    Lipids  Yes    Intervention  Provide education and support for participant on nutrition & aerobic/resistive exercise along with prescribed medications to achieve LDL <7067mHDL >57m47m  Expected Outcomes  Short Term: Participant states understanding of desired cholesterol values and is compliant with medications prescribed. Participant is following exercise prescription and nutrition guidelines.;Long Term: Cholesterol controlled with medications as prescribed, with individualized exercise RX and with personalized nutrition plan. Value goals: LDL < 70mg60mL > 40 mg.       Core Components/Risk Factors/Patient Goals Review:  Goals and Risk Factor Review    Row Name 06/30/18 0812 00345/19 0824           Core Components/Risk Factors/Patient Goals Review   Personal Goals Review  Weight Management/Obesity;Hypertension;Lipids  Weight Management/Obesity;Hypertension;Lipids      Review  Jerry's weight is climbing again.  He is eating but not always what is healthy.  He is doing well with his blood pressures other than the other day when he missed his medication in the morning.  He checks it at home twice a day.  He is doing well on his medications and may adjust his blood pressure meds some as it sometimes is slightly elevated.   We are going to add some more Interval training to try to give him a jump start his weight loss. Blood pressure has been high. He takes his meds regularly. We will send a note with BP's to Dr. [name] to see if he wants to make meds changes.       Expected Outcomes  Short: Get in more cardio to help with weight  loss.  Long: Continue to monitor blood pressures.   Short: Start HIIT on cardio equipment.  Long: Continue to take meds and monitor blood pressure.          Core Components/Risk Factors/Patient Goals at Discharge (Final Review):  Goals and Risk Factor Review - 07/21/18 0824      Core Components/Risk Factors/Patient Goals Review   Personal Goals Review  Weight Management/Obesity;Hypertension;Lipids    Review  We are going to add some more Interval training to try to give him a jump start his weight loss. Blood pressure has been high. He takes his meds regularly. We will send a note with BP's to Dr. [name] to see if he wants to make meds changes.     Expected Outcomes  Short: Start HIIT on cardio equipment.  Long: Continue to take meds and monitor blood pressure.        ITP Comments: ITP Comments    Row Name 06/06/18 1240 06/22/18 0559 06/28/18 0818 07/20/18 0607 07/27/18 1433   ITP Comments  Medical review completed today. New diagnosis new program start.  ITP sent to Dr Sabra Heck for review, changes as needed and signature. Documentation of diagnosis can be found in [facility] 9/03 visit  30 day review. Continue with ITP unless direccted changes per Medical Director Chart Review. New to program  Jerry's blood pressure was elevated when he came in this morning (200/102).  He sat for a while and was able to come down to the 170s.  He did the T5 and treadmill today without any problems.   30 day review. Continue with ITP unless direccted changes per Medical Director Chart Review.  [name] will be out for three weeks going to visit his grands in [place] for Christmas.    [place] Name 08/16/18 0635           ITP Comments  30 Day Review. Continue with ITP unless directed changes per Medical Director review          Comments: I left [name] [name]) a vm that Dr. [name] saw the info that I faxed him about his heart on Aug 25, 2018 (bigemny but was on the phone at the time).[name] news that [name] doesn't feel  any PVC.s Dr. [name] wrote "No changes".

## 2018-08-30 DIAGNOSIS — Z7902 Long term (current) use of antithrombotics/antiplatelets: Secondary | ICD-10-CM | POA: Diagnosis not present

## 2018-08-30 DIAGNOSIS — Z7982 Long term (current) use of aspirin: Secondary | ICD-10-CM | POA: Diagnosis not present

## 2018-08-30 DIAGNOSIS — Z7901 Long term (current) use of anticoagulants: Secondary | ICD-10-CM | POA: Diagnosis not present

## 2018-08-30 DIAGNOSIS — Z955 Presence of coronary angioplasty implant and graft: Secondary | ICD-10-CM

## 2018-08-30 DIAGNOSIS — I2510 Atherosclerotic heart disease of native coronary artery without angina pectoris: Secondary | ICD-10-CM | POA: Diagnosis not present

## 2018-08-30 DIAGNOSIS — Z951 Presence of aortocoronary bypass graft: Secondary | ICD-10-CM

## 2018-08-30 DIAGNOSIS — Z79899 Other long term (current) drug therapy: Secondary | ICD-10-CM | POA: Diagnosis not present

## 2018-08-30 NOTE — Progress Notes (Signed)
Daily Session Note  Patient Details  Name: Darrell [name] MRN: [id number] Date of Birth: 12-12-1945 Referring Provider:     Cardiac Rehab from 06/06/2018 in [facility] Cardiac and Pulmonary Rehab  Referring Provider  Serafina Royals MD      Encounter Date: 08/30/2018  Check In: Session Check In - 08/30/18 0757      Check-In   Supervising physician immediately available to respond to emergencies  See telemetry face sheet for immediately available ER MD    Location  [facility]-Cardiac & Pulmonary Rehab    Staff Present  Heath Lark, RN, BSN, CCRP;Jessica Jefferson, MA, RCEP, CCRP, Exercise Physiologist;Clay Menser Oletta Darter, [place], ACSM CEP, Exercise Physiologist    Medication changes reported      No    Fall or balance concerns reported     No    Warm-up and Cool-down  Performed as group-led instruction    Resistance Training Performed  Yes    VAD Patient?  No    PAD/SET Patient?  No      Pain Assessment   Currently in Pain?  No/denies    Multiple Pain Sites  No          Social History   Tobacco Use  Smoking Status Former Smoker  . Types: Cigarettes  . Last attempt to quit: 1989  . Years since quitting: 31.0  Smokeless Tobacco Never Used  Tobacco Comment   quit 30 years ago     Goals Met:  Independence with exercise equipment Exercise tolerated well Personal goals reviewed No report of cardiac concerns or symptoms Strength training completed today  Goals Unmet:  Not Applicable  Comments: Pt able to follow exercise prescription today without complaint.  Will continue to monitor for progression.    Dr. Emily Filbert is Medical Director for [place] and [facility].

## 2018-09-01 ENCOUNTER — Encounter: Payer: Medicare Other | Admitting: *Deleted

## 2018-09-01 DIAGNOSIS — Z7901 Long term (current) use of anticoagulants: Secondary | ICD-10-CM | POA: Diagnosis not present

## 2018-09-01 DIAGNOSIS — Z951 Presence of aortocoronary bypass graft: Secondary | ICD-10-CM

## 2018-09-01 DIAGNOSIS — Z7982 Long term (current) use of aspirin: Secondary | ICD-10-CM | POA: Diagnosis not present

## 2018-09-01 DIAGNOSIS — I2510 Atherosclerotic heart disease of native coronary artery without angina pectoris: Secondary | ICD-10-CM | POA: Diagnosis not present

## 2018-09-01 DIAGNOSIS — Z7902 Long term (current) use of antithrombotics/antiplatelets: Secondary | ICD-10-CM | POA: Diagnosis not present

## 2018-09-01 DIAGNOSIS — Z79899 Other long term (current) drug therapy: Secondary | ICD-10-CM | POA: Diagnosis not present

## 2018-09-01 NOTE — Progress Notes (Signed)
Daily Session Note  Patient Details  Name: Darrell [name] MRN: [id number] Date of Birth: [birth year] Referring Provider:     Cardiac Rehab from 06/06/2018 in [facility] Cardiac and Pulmonary Rehab  Referring Provider  Darrell Royals MD      Encounter Date: 09/01/2018  Check In: Session Check In - 09/01/18 0759      Check-In   Supervising physician immediately available to respond to emergencies  See telemetry face sheet for immediately available ER MD    Location  [facility]-Cardiac & Pulmonary Rehab    Staff Present  [name] RN, BSN;[name] BS, Exercise Physiologist    Medication changes reported      No    Fall or balance concerns reported     No    Warm-up and Cool-down  Performed as group-led instruction    Resistance Training Performed  Yes    VAD Patient?  No    PAD/SET Patient?  No      Pain Assessment   Currently in Pain?  No/denies          Social History   Tobacco Use  Smoking Status Former Smoker  . Types: Cigarettes  . Last attempt to quit: 1989  . Years since quitting: 31.0  Smokeless Tobacco Never Used  Tobacco Comment   quit 30 years ago     Goals Met:  Independence with exercise equipment Exercise tolerated well No report of cardiac concerns or symptoms Strength training completed today  Goals Unmet:  Not Applicable  Comments: Pt able to follow exercise prescription today without complaint.  Will continue to monitor for progression.    Dr. Emily [name] is Medical Director for [place] and [facility].

## 2018-09-06 DIAGNOSIS — I25810 Atherosclerosis of coronary artery bypass graft(s) without angina pectoris: Secondary | ICD-10-CM | POA: Diagnosis not present

## 2018-09-06 DIAGNOSIS — I255 Ischemic cardiomyopathy: Secondary | ICD-10-CM | POA: Diagnosis not present

## 2018-09-07 ENCOUNTER — Encounter: Payer: Self-pay | Admitting: Family Medicine

## 2018-09-07 ENCOUNTER — Ambulatory Visit (INDEPENDENT_AMBULATORY_CARE_PROVIDER_SITE_OTHER): Payer: Medicare Other | Admitting: Family Medicine

## 2018-09-07 VITALS — BP 136/86 | HR 56 | Temp 97.6°F | Ht 69.0 in | Wt 235.2 lb

## 2018-09-07 DIAGNOSIS — E785 Hyperlipidemia, unspecified: Secondary | ICD-10-CM | POA: Diagnosis not present

## 2018-09-07 DIAGNOSIS — I10 Essential (primary) hypertension: Secondary | ICD-10-CM | POA: Diagnosis not present

## 2018-09-07 DIAGNOSIS — G4730 Sleep apnea, unspecified: Secondary | ICD-10-CM

## 2018-09-07 DIAGNOSIS — G4733 Obstructive sleep apnea (adult) (pediatric): Secondary | ICD-10-CM | POA: Diagnosis not present

## 2018-09-07 LAB — LP+ALT+AST PICCOLO, WAIVED
ALT (SGPT) Piccolo, Waived: 36 U/L (ref 10–47)
AST (SGOT) Piccolo, Waived: 35 U/L (ref 11–38)
CHOL/HDL RATIO PICCOLO,WAIVE: 2.2 mg/dL
CHOLESTEROL PICCOLO, WAIVED: 114 mg/dL (ref <200)
HDL CHOL PICCOLO, WAIVED: 52 mg/dL — AB (ref >59)
LDL CHOL CALC PICCOLO WAIVED: 36 mg/dL (ref <100)
TRIGLYCERIDES PICCOLO,WAIVED: 129 mg/dL (ref <150)
VLDL CHOL CALC PICCOLO,WAIVE: 26 mg/dL (ref <30)

## 2018-09-07 NOTE — Assessment & Plan Note (Signed)
The current medical regimen is effective;  continue present plan and medications.  

## 2018-09-07 NOTE — Assessment & Plan Note (Signed)
Referral to sleep specialist to further evaluate use of machine and importance.  Patient notified to let us know if the appointment does not come through in a timely manner

## 2018-09-07 NOTE — Progress Notes (Signed)
BP 136/86 (BP Location: Left Arm)   Pulse (!) 56   Temp 97.6 F (36.4 C) (Oral)   Ht 5\' 9"  (1.753 m)   Wt 235 lb 3.2 oz (106.7 kg)   SpO2 97%   BMI 34.73 kg/m    Subjective:    Patient ID: Darrell [name], male    DOB: [birth year], 73 y.o.   MRN: [id number]  HPI: Darrell [name] is a 73 y.o. male  Chief Complaint  Patient presents with  . Hypertension  . Hyperlipidemia  Patient follow-up blood pressure originally elevated but difficult to read because of bradycardia.  Blood pressure on repeats are normal. Patient with ongoing cardiovascular disease because of this cholesterol needs to be very low patient doing well on his medication taken faithfully without problems. Cholesterol doing well with medication no complaints. Reviewed cardiology notes and emphasizing importance of treating his sleep apnea.  Patient still not wearing his CPAP mask because of difficulties has not heard from follow-up for CPAP referral.  We will redo again.  Relevant past medical, surgical, family and social history reviewed and updated as indicated. Interim medical history since our last visit reviewed. Allergies and medications reviewed and updated.  Review of Systems  Constitutional: Negative.   Respiratory: Negative.   Cardiovascular: Negative.     Per HPI unless specifically indicated above     Objective:    BP 136/86 (BP Location: Left Arm)   Pulse (!) 56   Temp 97.6 F (36.4 C) (Oral)   Ht 5\' 9"  (1.753 m)   Wt 235 lb 3.2 oz (106.7 kg)   SpO2 97%   BMI 34.73 kg/m   Wt Readings from Last 3 Encounters:  09/07/18 235 lb 3.2 oz (106.7 kg)  08/26/18 233 lb 7.5 oz (105.9 kg)  06/09/18 225 lb (102.1 kg)    Physical Exam Constitutional:      Appearance: He is well-developed.  HENT:     Head: Normocephalic and atraumatic.  Eyes:     Conjunctiva/sclera: Conjunctivae normal.  Neck:     Musculoskeletal: Normal range of motion.  Cardiovascular:     Rate and Rhythm: Normal rate and  regular rhythm.     Heart sounds: Normal heart sounds.  Pulmonary:     Effort: Pulmonary effort is normal.     Breath sounds: Normal breath sounds.  Musculoskeletal: Normal range of motion.  Skin:    Findings: No erythema.  Neurological:     Mental Status: He is alert and oriented to person, place, and time.  Psychiatric:        Behavior: Behavior normal.        Thought Content: Thought content normal.        Judgment: Judgment normal.     Results for orders placed or performed during the hospital encounter of 04/10/18  CBC  Result Value Ref Range   WBC 6.0 3.8 - 10.6 K/uL   RBC 4.01 (L) 4.40 - 5.90 MIL/uL   Hemoglobin 13.4 13.0 - 18.0 g/dL   HCT 37.6 (L) 40.0 - 52.0 %   MCV 93.8 80.0 - 100.0 fL   MCH 33.5 26.0 - 34.0 pg   MCHC 35.7 32.0 - 36.0 g/dL   RDW 13.5 11.5 - 14.5 %   Platelets 127 (L) 150 - 440 K/uL  Basic metabolic panel  Result Value Ref Range   Sodium 141 135 - 145 mmol/L   Potassium 3.5 3.5 - 5.1 mmol/L   Chloride 109 98 - 111 mmol/L  CO2 26 22 - 32 mmol/L   Glucose, Bld 131 (H) 70 - 99 mg/dL   BUN 28 (H) 8 - 23 mg/dL   Creatinine, Ser 1.38 (H) 0.61 - 1.24 mg/dL   Calcium 8.0 (L) 8.9 - 10.3 mg/dL   GFR calc non Af Amer 50 (L) >60 mL/min   GFR calc Af Amer 58 (L) >60 mL/min   Anion gap 6 5 - 15  Troponin I  Result Value Ref Range   Troponin I <0.03 <0.03 ng/mL  Troponin I  Result Value Ref Range   Troponin I 0.04 (HH) <0.03 ng/mL      Assessment & Plan:   Problem List Items Addressed This Visit      Cardiovascular and Mediastinum   Hypertension - Primary   Relevant Orders   Basic metabolic panel   Benign essential hypertension    The current medical regimen is effective;  continue present plan and medications.         Respiratory   Sleep apnea    Referral to sleep specialist to further evaluate use of machine and importance.  Patient notified to let us know if the appointment does not come through in a timely manner      OSA  (obstructive sleep apnea)   Relevant Orders   Ambulatory referral to Sleep Studies     Other   Hyperlipidemia    The current medical regimen is effective;  continue present plan and medications.       Relevant Orders   LP+ALT+AST [place], Waived       Follow up plan: Return in about 6 months (around 03/08/2019) for Physical Exam.

## 2018-09-08 ENCOUNTER — Encounter: Payer: Medicare Other | Admitting: *Deleted

## 2018-09-08 ENCOUNTER — Encounter: Payer: Self-pay | Admitting: Family Medicine

## 2018-09-08 DIAGNOSIS — Z951 Presence of aortocoronary bypass graft: Secondary | ICD-10-CM | POA: Diagnosis not present

## 2018-09-08 DIAGNOSIS — I2510 Atherosclerotic heart disease of native coronary artery without angina pectoris: Secondary | ICD-10-CM | POA: Diagnosis not present

## 2018-09-08 DIAGNOSIS — Z7901 Long term (current) use of anticoagulants: Secondary | ICD-10-CM | POA: Diagnosis not present

## 2018-09-08 DIAGNOSIS — Z7982 Long term (current) use of aspirin: Secondary | ICD-10-CM | POA: Diagnosis not present

## 2018-09-08 DIAGNOSIS — Z7902 Long term (current) use of antithrombotics/antiplatelets: Secondary | ICD-10-CM | POA: Diagnosis not present

## 2018-09-08 DIAGNOSIS — Z79899 Other long term (current) drug therapy: Secondary | ICD-10-CM | POA: Diagnosis not present

## 2018-09-08 LAB — BASIC METABOLIC PANEL
BUN/Creatinine Ratio: 14 (ref 10–24)
BUN: 17 mg/dL (ref 8–27)
CALCIUM: 9.3 mg/dL (ref 8.6–10.2)
CO2: 24 mmol/L (ref 20–29)
CREATININE: 1.18 mg/dL (ref 0.76–1.27)
Chloride: 104 mmol/L (ref 96–106)
GFR, EST AFRICAN AMERICAN: 71 mL/min/{1.73_m2} (ref >59)
GFR, EST NON AFRICAN AMERICAN: 62 mL/min/{1.73_m2} (ref >59)
Glucose: 85 mg/dL (ref 65–99)
Potassium: 4.3 mmol/L (ref 3.5–5.2)
Sodium: 144 mmol/L (ref 134–144)

## 2018-09-08 NOTE — Progress Notes (Signed)
Daily Session Note  Patient Details  Name: Darrell [name] MRN: [id number] Date of Birth: 1946-07-25 Referring Provider:     Cardiac Rehab from 06/06/2018 in [facility] Cardiac and Pulmonary Rehab  Referring Provider  Serafina Royals MD      Encounter Date: 09/08/2018  Check In: Session Check In - 09/08/18 0803      Check-In   Supervising physician immediately available to respond to emergencies  See telemetry face sheet for immediately available ER MD    Location  [facility]-Cardiac & Pulmonary Rehab    Staff Present  Jasper Loser BS, Exercise Physiologist;Carroll Enterkin, RN, BSN    Medication changes reported      No    Fall or balance concerns reported     No    Warm-up and Cool-down  Performed as group-led instruction    Resistance Training Performed  Yes    VAD Patient?  No    PAD/SET Patient?  No      Pain Assessment   Currently in Pain?  No/denies          Social History   Tobacco Use  Smoking Status Former Smoker  . Types: Cigarettes  . Last attempt to quit: 1989  . Years since quitting: 31.0  Smokeless Tobacco Never Used  Tobacco Comment   quit 30 years ago     Goals Met:  Independence with exercise equipment Exercise tolerated well No report of cardiac concerns or symptoms Strength training completed today  Goals Unmet:  Not Applicable  Comments: Pt able to follow exercise prescription today without complaint.  Will continue to monitor for progression.    Dr. Emily Filbert is Medical Director for [facility].

## 2018-09-09 ENCOUNTER — Ambulatory Visit: Payer: Medicare Other

## 2018-09-09 ENCOUNTER — Ambulatory Visit: Payer: Medicare Other | Admitting: Oncology

## 2018-09-13 VITALS — Ht 68.1 in | Wt 226.0 lb

## 2018-09-13 DIAGNOSIS — Z951 Presence of aortocoronary bypass graft: Secondary | ICD-10-CM | POA: Diagnosis not present

## 2018-09-13 DIAGNOSIS — Z7902 Long term (current) use of antithrombotics/antiplatelets: Secondary | ICD-10-CM | POA: Diagnosis not present

## 2018-09-13 DIAGNOSIS — I2510 Atherosclerotic heart disease of native coronary artery without angina pectoris: Secondary | ICD-10-CM | POA: Diagnosis not present

## 2018-09-13 DIAGNOSIS — Z7901 Long term (current) use of anticoagulants: Secondary | ICD-10-CM | POA: Diagnosis not present

## 2018-09-13 DIAGNOSIS — Z79899 Other long term (current) drug therapy: Secondary | ICD-10-CM | POA: Diagnosis not present

## 2018-09-13 DIAGNOSIS — Z7982 Long term (current) use of aspirin: Secondary | ICD-10-CM | POA: Diagnosis not present

## 2018-09-13 DIAGNOSIS — Z955 Presence of coronary angioplasty implant and graft: Secondary | ICD-10-CM

## 2018-09-13 NOTE — Progress Notes (Signed)
Daily Session Note  Patient Details  Name: Darrell [name] MRN: [id number] Date of Birth: May 23, 1946 Referring Provider:     Cardiac Rehab from 06/06/2018 in [facility] Cardiac and Pulmonary Rehab  Referring Provider  Serafina Royals MD      Encounter Date: 09/13/2018  Check In: Session Check In - 09/13/18 0758      Check-In   Supervising physician immediately available to respond to emergencies  See telemetry face sheet for immediately available ER MD    Location  [facility]-Cardiac & Pulmonary Rehab    Staff Present  Darel Hong, RN BSN;[name] BS, Exercise Physiologist;Jessica Luan Pulling, MA, RCEP, CCRP, Exercise Physiologist;Amanda Oletta Darter, BA, ACSM CEP, Exercise Physiologist    Medication changes reported      No    Fall or balance concerns reported     No    Warm-up and Cool-down  Performed as group-led instruction    Resistance Training Performed  Yes    VAD Patient?  No    PAD/SET Patient?  No      Pain Assessment   Currently in Pain?  No/denies    Multiple Pain Sites  No          Social History   Tobacco Use  Smoking Status Former Smoker  . Types: Cigarettes  . Last attempt to quit: 1989  . Years since quitting: 31.0  Smokeless Tobacco Never Used  Tobacco Comment   quit 30 years ago     Goals Met:  Independence with exercise equipment Exercise tolerated well No report of cardiac concerns or symptoms Strength training completed today  Goals Unmet:  Not Applicable  Comments:  6 Minute Walk    Row Name 06/06/18 1348         6 Minute Walk   Phase  Initial     Distance  1520 feet     Walk Time  6 minutes     # of Rest Breaks  0     MPH  2.88     METS  2.91     RPE  11     VO2 Peak  10.18     Symptoms  No     Resting HR  74 bpm     Resting BP  108/64     Resting Oxygen Saturation   97 %     Exercise Oxygen Saturation  during 6 min walk  98 %     Max Ex. HR  115 bpm     Max Ex. BP  174/76     2 Minute Post BP  126/62           Dr. Emily Filbert is Medical Director for [facility].

## 2018-09-14 DIAGNOSIS — Z951 Presence of aortocoronary bypass graft: Secondary | ICD-10-CM

## 2018-09-14 NOTE — Progress Notes (Signed)
Cardiac Individual Treatment Plan  Patient Details  Name: Darrell [name] MRN: [id number] Date of Birth: 1946-03-01 Referring Provider:     Cardiac Rehab from 06/06/2018 in [facility] Cardiac [facility] Pul[facility] Rehab  Referring Provider  Serafina Royals MD      Initial Encounter Date:    Cardiac Rehab from 06/06/2018 in [facility] Cardiac and Pulmonary Rehab  Date  06/06/18      Visit Diagnosis: S/P CABG x 2  Patient's Home Medications on Admission:  Current Outpatient Medications:  .  allopurinol (ZYLOPRIM) 300 MG tablet, Take 0.5 tablets (150 mg total) by mouth daily., Disp: 90 tablet, Rfl: 4 .  apixaban (ELIQUIS) 5 MG TABS tablet, Take 5 mg by mouth 2 (two) times daily. , Disp: , Rfl:  .  aspirin EC 81 MG tablet, Take by mouth., Disp: , Rfl:  .  atorvastatin (LIPITOR) 80 MG tablet, Take 80 mg by mouth daily., Disp: , Rfl:  .  carvedilol (COREG) 6.25 MG tablet, Take by mouth., Disp: , Rfl:  .  hydrochlorothiazide (HYDRODIURIL) 25 MG tablet, Take 1 tablet (25 mg total) by mouth daily., Disp: 90 tablet, Rfl: 4 .  pravastatin (PRAVACHOL) 40 MG tablet, Take 1 tablet by mouth 1 day or 1 dose., Disp: , Rfl:  .  telmisartan (MICARDIS) 80 MG tablet, Take by mouth., Disp: , Rfl:   Past Medical History: Past Medical History:  Diagnosis Date  . CAD (coronary artery disease)   . Cancer [facility])    prostate  . Carotid artery stenosis   . Hyperlipidemia   . Hypertension   . Psoriasis     Tobacco Use: Social History   Tobacco Use  Smoking Status Former Smoker  . Types: Cigarettes  . Last attempt to quit: 1989  . Years since quitting: 31.0  Smokeless Tobacco Never Used  Tobacco Comment   quit 30 years ago     Labs: Recent Review Flowsheet Data    Labs for ITP Cardiac and Pulmonary Rehab Latest Ref Rng & Units 12/17/2016 05/20/2017 06/09/2017 12/20/2017 09/07/2018   Cholestrol <200 mg/dL 104 - 106 134 114   LDLCALC 0 - 99 mg/dL 39 - 42 44 -   HDL >39 mg/dL 41 - 41 39(L) -   Trlycerides <150  mg/dL 122 - 114 257(H) 129   Hemoglobin A1c 4.8 - 5.6 % 6.0 5.6 - - -       Exercise Target Goals: Exercise Program Goal: Individual exercise prescription set using results from initial 6 min walk test and THRR while considering  patient's activity barriers and safety.   Exercise Prescription Goal: Initial exercise prescription builds to 30-45 minutes a day of aerobic activity, 2-3 days per week.  Home exercise guidelines will be given to patient during program as part of exercise prescription that the participant will acknowledge.  Activity Barriers & Risk Stratification: Activity Barriers & Cardiac Risk Stratification - 06/06/18 1252      Activity Barriers & Cardiac Risk Stratification   Activity Barriers  Deconditioning;Muscular Weakness    Cardiac Risk Stratification  High       6 Minute Walk: 6 Minute Walk    Row Name 06/06/18 1348 09/13/18 0840       6 Minute Walk   Phase  Initial  Discharge    Distance  1520 feet  1700 feet    Distance % Change  -  11.8 %    Distance Feet Change  -  180 ft    Walk Time  6  minutes  6 minutes    # of Rest Breaks  0  0    MPH  2.88  3.22    METS  2.91  3.72    RPE  11  12    VO2 Peak  10.18  13    Symptoms  No  No    Resting HR  74 bpm  64 bpm    Resting BP  108/64  148/86    Resting Oxygen Saturation   97 %  -    Exercise Oxygen Saturation  during 6 min walk  98 %  97 %    Max Ex. HR  115 bpm  105 bpm    Max Ex. BP  174/76  190/82    2 Minute Post BP  126/62  -       Oxygen Initial Assessment:   Oxygen Re-Evaluation:   Oxygen Discharge (Final Oxygen Re-Evaluation):   Initial Exercise Prescription: Initial Exercise Prescription - 06/06/18 1300      Date of Initial Exercise RX and Referring Provider   Date  06/06/18    Referring Provider  Serafina Royals MD      Treadmill   MPH  2.3    Grade  0.5    Minutes  15    METs  2.93      Elliptical   Level  1    Speed  3    Minutes  15      T5 Nustep   Level  3     SPM  80    Minutes  15    METs  3      Prescription Details   Frequency (times per week)  2    Duration  Progress to 30 minutes of continuous aerobic without signs/symptoms of physical distress      Intensity   THRR 40-80% of Max Heartrate  104-135    Ratings of Perceived Exertion  11-13    Perceived Dyspnea  0-4      Progression   Progression  Continue to progress workloads to maintain intensity without signs/symptoms of physical distress.      Resistance Training   Training Prescription  Yes    Weight  4 lbs    Reps  10-15       Perform Capillary Blood Glucose checks as needed.  Exercise Prescription Changes: Exercise Prescription Changes    Row Name 06/06/18 1200 06/14/18 1000 06/28/18 1600 07/11/18 1600 07/27/18 1400     Response to Exercise   Blood Pressure (Admit)  108/64  136/60  200/100 164/84  138/78  122/72   Blood Pressure (Exercise)  174/76  140/62  170/78  132/70  160/70   Blood Pressure (Exit)  126/62  126/70  146/80  126/70  118/70   Heart Rate (Admit)  74 bpm  65 bpm  48 bpm  72 bpm  59 bpm   Heart Rate (Exercise)  115 bpm  95 bpm  110 bpm  103 bpm  118 bpm   Heart Rate (Exit)  85 bpm  64 bpm  85 bpm  78 bpm  52 bpm   Oxygen Saturation (Admit)  97 %  -  -  -  -   Oxygen Saturation (Exercise)  98 %  -  -  -  -   Rating of Perceived Exertion (Exercise)  _0 Symptoms  none  none  none  none  none  Comments  walk test results  first full day of exercise  second full day of exercise  -  -   Duration  -  Continue with 30 min of aerobic exercise without signs/symptoms of physical distress.  Continue with 30 min of aerobic exercise without signs/symptoms of physical distress.  Continue with 30 min of aerobic exercise without signs/symptoms of physical distress.  Continue with 30 min of aerobic exercise without signs/symptoms of physical distress.   Intensity  -  THRR unchanged  THRR unchanged  THRR unchanged  THRR unchanged     Progression    Progression  -  Continue to progress workloads to maintain intensity without signs/symptoms of physical distress.  Continue to progress workloads to maintain intensity without signs/symptoms of physical distress.  Continue to progress workloads to maintain intensity without signs/symptoms of physical distress.  Continue to progress workloads to maintain intensity without signs/symptoms of physical distress.   Average METs  -  2.9  2.96  3.66  3.41     Resistance Training   Training Prescription  -  Yes  Yes  Yes  Yes   Weight  -  4 lbs  4 lbs  4 lbs  4 lbs   Reps  -  10-15  10-15  10-15  10-15     Interval Training   Interval Training  -  No  No  No  No     Treadmill   MPH  -  -  2.8  2.8  2.8   Grade  -  -  _0 Minutes  -  -  _1 METs  -  -  3.92  3.92  3.92     Elliptical   Level  -  _2 Speed  -  _3 4.5   Minutes  -  _4 T5 Nustep   Level  -  _5 Minutes  -  _6 METs  -  2.9  2  3.4  2.9     Home Exercise Plan   Plans to continue exercise at  -  [employer] (comment) walking, [employer], [place] (comment) walking, [employer], [place] (comment) walking, [employer], [place] (comment) walking, [employer], firehouse gym   Frequency  -  Add 3 additional days to program exercise sessions.  Add 3 additional days to program exercise sessions.  Add 3 additional days to program exercise sessions.  Add 3 additional days to program exercise sessions.   Initial Home Exercises Provided  -  06/14/18  06/14/18  06/14/18  06/14/18   Row Name 08/23/18 1600 09/07/18 1300           Response to Exercise   Blood Pressure (Admit)  140/80  108/60      Blood Pressure (Exercise)  164/82  160/68      Blood Pressure (Exit)  132/84  130/84      Heart Rate (Admit)  62 bpm  84 bpm      Heart Rate (Exercise)  98 bpm  106 bpm      Heart  Rate (Exit)  68 bpm  74 bpm      Rating  of Perceived Exertion (Exercise)  11  12      Symptoms  none  none      Duration  Continue with 30 min of aerobic exercise without signs/symptoms of physical distress.  Continue with 30 min of aerobic exercise without signs/symptoms of physical distress.      Intensity  THRR unchanged  THRR unchanged        Progression   Progression  Continue to progress workloads to maintain intensity without signs/symptoms of physical distress.  Continue to progress workloads to maintain intensity without signs/symptoms of physical distress.      Average METs  3.21  3.66        Resistance Training   Training Prescription  Yes  Yes      Weight  4 lbs  6 lbs      Reps  10-15  10-15        Interval Training   Interval Training  No  No        Treadmill   MPH  2.8  2.8      Grade  2  2      Minutes  15  15      METs  3.92  3.92        Elliptical   Level  -  3      Speed  -  4      Minutes  -  15        T5 Nustep   Level  5  5      Minutes  15  15      METs  2.5  3.4        Home Exercise Plan   Plans to continue exercise at  -  [employer] (comment) walking, [employer], firehouse gym      Frequency  -  Add 3 additional days to program exercise sessions.      Initial Home Exercises Provided  -  06/14/18         Exercise Comments: Exercise Comments    Row Name 06/14/18 [phone]           Exercise Comments  First full day of exercise!  Patient was oriented to gym and equipment including functions, settings, policies, and procedures.  Patient's individual exercise prescription and treatment plan were reviewed.  All starting workloads were established based on the results of the 6 minute walk test done at initial orientation visit.  The plan for exercise progression was also introduced and progression will be customized based on patient's performance and goals.          Exercise Goals and Review: Exercise Goals    Row Name 06/06/18 1351              Exercise Goals   Increase Physical Activity  Yes       Intervention  Provide advice, education, support and counseling about physical activity/exercise needs.;Develop an individualized exercise prescription for aerobic and resistive training based on initial evaluation findings, risk stratification, comorbidities and participant's personal goals.       Expected Outcomes  Short Term: Attend rehab on a regular basis to increase amount of physical activity.;Long Term: Add in home exercise to make exercise part of routine and to increase amount of physical activity.;Long Term: Exercising regularly at least 3-5 days a week.       Increase Strength and Stamina  Yes       Intervention  Provide advice, education, support and  counseling about physical activity/exercise needs.;Develop an individualized exercise prescription for aerobic and resistive training based on initial evaluation findings, risk stratification, comorbidities and participant's personal goals.       Expected Outcomes  Short Term: Perform resistance training exercises routinely during rehab and add in resistance training at home;Long Term: Improve cardiorespiratory fitness, muscular endurance and strength as measured by increased METs and functional capacity (6MWT);Short Term: Increase workloads from initial exercise prescription for resistance, speed, and METs.       Able to understand and use rate of perceived exertion (RPE) scale  Yes       Intervention  Provide education and explanation on how to use RPE scale       Expected Outcomes  Short Term: Able to use RPE daily in rehab to express subjective intensity level;Long Term:  Able to use RPE to guide intensity level when exercising independently       Knowledge and understanding of Target Heart Rate Range (THRR)  Yes       Intervention  Provide education and explanation of THRR including how the numbers were predicted and where they are located for reference       Expected  Outcomes  Short Term: Able to state/look up THRR;Long Term: Able to use THRR to govern intensity when exercising independently;Short Term: Able to use daily as guideline for intensity in rehab       Able to check pulse independently  Yes       Intervention  Provide education and demonstration on how to check pulse in carotid and radial arteries.;Review the importance of being able to check your own pulse for safety during independent exercise       Expected Outcomes  Short Term: Able to explain why pulse checking is important during independent exercise;Long Term: Able to check pulse independently and accurately       Understanding of Exercise Prescription  Yes       Intervention  Provide education, explanation, and written materials on patient's individual exercise prescription       Expected Outcomes  Short Term: Able to explain program exercise prescription;Long Term: Able to explain home exercise prescription to exercise independently          Exercise Goals Re-Evaluation : Exercise Goals Re-Evaluation    Row Name 06/14/18 0917 06/14/18 1024 06/28/18 1618 06/30/18 0805 07/11/18 1634     Exercise Goal Re-Evaluation   Exercise Goals Review  Increase Physical Activity;Increase Strength and Stamina;Able to understand and use rate of perceived exertion (RPE) scale;Knowledge and understanding of Target Heart Rate Range (THRR);Understanding of Exercise Prescription  Increase Physical Activity;Increase Strength and Stamina;Understanding of Exercise Prescription  Increase Physical Activity;Increase Strength and Stamina;Understanding of Exercise Prescription  Increase Physical Activity;Increase Strength and Stamina;Understanding of Exercise Prescription  Increase Physical Activity;Increase Strength and Stamina;Understanding of Exercise Prescription   Comments  Reviewed RPE scale, THR and program prescription with pt today.  Pt voiced understanding and was given a copy of goals to take home.   Reviewed home  exercise guidelines with Darrell [name].  He will continue to go to [employer] and walk.  He also has access to gym at fire house.   Darrell [name] is off to a good start in rehab.  He has completed his second full day of exercise. Today, he had some issues with his blood pressure being high coming in the door, but was able to exercise.  We will continue to monitor his progression.   Darrell [name] is doing well in rehab.  He has been walking around town as they wouldn't let him go to gym.  We talked about just going in to do his cardio and staying off the weight equipment.  He recovered from surgery quickly, but is leveling out on his strength now.  He has not had any afib symptoms and just a little pain at the insicion site.    Darrell [name] continues to do well in rehab. He comes when his work schedule allows, so he bounces around the different classes.  He is up to level 5 on the T5 NuStep.  We will continue to monitor his progression.    Expected Outcomes  Short: Use RPE daily to regulate intensity. Long: Follow program prescription in THR.  Short: Continue to exericse more regularly.  Long: Continue to increase activity and stamina.   Short: Attend class regularly.  Long: Continue to exercies independently on off days.   Short: Start adding in more cardio at the gym.  Long: Continue to stay active on off days.   Short: Continue to increase workloads.  Long: Continue to add in cardio on off days.    [place] Name 07/21/18 8341 07/27/18 1434 08/09/18 0959 08/23/18 1610 08/30/18 0812     Exercise Goal Re-Evaluation   Exercise Goals Review  Increase Physical Activity;Increase Strength and Stamina;Able to check pulse independently  Increase Physical Activity;Increase Strength and Stamina;Able to check pulse independently  -  Increase Physical Activity;Increase Strength and Stamina;Able to check pulse independently  Increase Physical Activity;Increase Strength and Stamina;Able to check pulse independently   Comments  Darrell [name] continues do very well  in rehab. He is doing cardio walking and using the Elliptical at [employer]. He is not using weights there. He is walking in the parade this weekend.  Darrell [name] is up to level on the elliptical now.  He continues to do well.  He is planning to walk while out on vacation to [place].  We will continue to monitor his progress.   Out since last review  Darrell [name] returned today after his trip to [place].  He enjoyed his trip and was able to return to his normal workloads.  We will continue to monitor his progress.   Darrell [name] is doing well in rehab. 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 He is starting to get his strength and stamina back.  He is still working his way back up with his strength since he cut it in half after surgery.     Expected Outcomes  Short: Continue to workout 2-3 extra days. Long: Darrell [name] would like to add weight machines back to his workout regimen. Start with 15 lbs and work up slowly.  Short: Walk while out on vacation.  Long: Continue to regain strength back.   -  Short: Attend classes regularly again.  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   Short: Continue to get to class regularly.  Long: Continue to get in his exercise especially cardio.    [place] Name 09/07/18 1335             Exercise Goal Re-Evaluation   Exercise Goals Review  Increase Physical Activity;Increase Strength and Stamina;Able to check pulse independently       Comments  Darrell [name] continues to do well in rehab.  He is up to 3.4 METs on the T5 NuStep.  We will continue to monitor his progress.        Expected Outcomes  Short: Increase treadmill and talk about intervals.  Long: Continue to exercise regularly          Discharge Exercise Prescription (Final Exercise Prescription Changes): Exercise Prescription Changes - 09/07/18 1300      Response to Exercise   Blood Pressure (Admit)  108/60    Blood Pressure (Exercise)  160/68    Blood Pressure (Exit)  130/84    Heart  Rate (Admit)  84 bpm    Heart Rate (Exercise)  106 bpm    Heart Rate (Exit)  74 bpm    Rating of Perceived Exertion (Exercise)  12    Symptoms  none    Duration  Continue with 30 min of aerobic exercise without signs/symptoms of physical distress.    Intensity  THRR unchanged      Progression   Progression  Continue to progress workloads to maintain intensity without signs/symptoms of physical distress.    Average METs  3.66      Resistance Training   Training Prescription  Yes    Weight  6 lbs    Reps  10-15      Interval Training   Interval Training  No      Treadmill   MPH  2.8    Grade  2    Minutes  15    METs  3.92      Elliptical   Level  3    Speed  4    Minutes  15      T5 Nustep   Level  5    Minutes  15    METs  3.4      Home Exercise Plan   Plans to continue exercise at  [employer] (comment)   walking, [employer], firehouse gym   Frequency  Add 3 additional days to program exercise sessions.    Initial Home Exercises Provided  06/14/18       Nutrition:  Target Goals: Understanding of nutrition guidelines, daily intake of sodium <1540m, cholesterol <2071m calories 30% from fat and 7% or less from saturated fats, daily to have 5 or more servings of fruits and vegetables.  Biometrics: Pre Biometrics - 06/06/18 1353      Pre Biometrics   Height  5' 8.1" (1.73 m)  (Pended)     Weight  222 lb 11.2 oz (101 kg)  (Pended)     Waist Circumference  42 inches  (Pended)     Hip Circumference  43 inches  (Pended)     Waist to Hip Ratio  0.98 %  (Pended)     BMI (Calculated)  33.75  (Pended)     Single Leg Stand  30 seconds  (Pended)       Post Biometrics - 09/13/18 0839       Post  Biometrics   Height  5' 8.1" (1.73 m)    Weight  226 lb (102.5 kg)    Waist Circumference  43 inches    Hip Circumference  43.5 inches    Waist to Hip Ratio  0.99 %    BMI (Calculated)  34.25    Single Leg Stand  30 seconds       Nutrition Therapy Plan  and Nutrition Goals: Nutrition Therapy & Goals - 06/14/18 1007      Nutrition Therapy   Diet  DASH    Drug/Food Interactions  Statins/Certain Fruits    Protein (specify units)  9oz    Fiber  30 grams  Whole Grain Foods  3 servings   chooses whole grains half of the time   Saturated Fats  16 max. grams    Fruits and Vegetables  5 servings/day   8 ideal; eats vegetables regularly but not fruits   Sodium  1500 grams      Personal Nutrition Goals   Nutrition Goal  Increase fruit intake by at least one additional serving per week    Personal Goal #2  Increase fluid intake by at least one additional 8oz glass per day. Try adding extra fluids around exercise times first    Comments  His wife has been helping to change his diet by cooking without salt, buying low sodium products and decreasing portion sizes at meal times. She provides vegetables and whole grain choices regularly. They do not eat out often but he does choose fried foods when eating out occasionally. They eat a variety of protein sources and they do not fry foods at home. They ask for french fries without salt when eating at [facility]. Eats 3 meals/day; breakfast: cheerios with 2% milk, lunch: leftovers, sandwich, [employer], dinner: varies (soups, roast, chicken, shrimp, wheat pasta, brown rice, beef tips, salads, vegetables, baked potato). He drinks mostly water with the occasional sweet tea or diluted orange juice. He does not eat fruit regularly but does eat vegetables that his wife cooks      [place], educate and counsel regarding individualized specific dietary modifications aiming towards targeted core components such as weight, hypertension, lipid management, diabetes, heart failure and other comorbidities.    Expected Outcomes  Short Term Goal: Understand basic principles of dietary content, such as calories, fat, sodium, cholesterol and nutrients.;Short Term Goal: A plan has been developed with  personal nutrition goals set during dietitian appointment.;Long Term Goal: Adherence to prescribed nutrition plan.       Nutrition Assessments: Nutrition Assessments - 06/06/18 1302      MEDFICTS Scores   Pre Score  42       Nutrition Goals Re-Evaluation: Nutrition Goals Re-Evaluation    Row Name 06/14/18 1012 07/12/18 0829 08/23/18 0835         Goals   Nutrition Goal  Increase fluid intake by at least one additional 8oz glass per day. Try adding extra fluids around exercise times first  Increase fruit intake by at least one additional serving per week; Increase fluid intake by at least one additional 8oz glass per day, try adding extra fluids around exercise times first  Increase fruit intake by at least one additional serving per week; Increase fluid intake by at least one additional 8oz glass/day     Comment  He drinks mostly zero sugar beverages but does not feel that he drinks enough each day  He has been making an effort to drink more fluids each day. Today in class he had a glass of water with him while exercising. He feels that overall he is "doing better" with his diet and his wife is in charge of the food he eats and the cooking at home, who has been trying to provide healthier options and use less salt. He has lost a couple of pounds. He has not yet been intentional about eating more fruit  He reports to have been out of his regular routine during the holidays and ate some foods that he does not typically eat. He has continued to drink more fluids but has not eaten many more fruits. He and his wife plan  to get back into the swing of preparing heart-healthy meals     Expected Outcome  He will try to drink at least one more glass of water daily, ideally around exercise sessions  He will work on eating more fruit each week and continue to increase fluid intake. He will work with his wife to have healthier options to eat at home  He will maintain habit of drinking more fluids. Continue to  prepare heart healthy meals at home. Long term goal: eat more fruit       Personal Goal #2 Re-Evaluation   Personal Goal #2  Increase fruit intake by at least one additional serving per week  -  -        Nutrition Goals Discharge (Final Nutrition Goals Re-Evaluation): Nutrition Goals Re-Evaluation - 08/23/18 0835      Goals   Nutrition Goal  Increase fruit intake by at least one additional serving per week; Increase fluid intake by at least one additional 8oz glass/day    Comment  He reports to have been out of his regular routine during the holidays and ate some foods that he does not typically eat. He has continued to drink more fluids but has not eaten many more fruits. He and his wife plan to get back into the swing of preparing heart-healthy meals    Expected Outcome  He will maintain habit of drinking more fluids. Continue to prepare heart healthy meals at home. Long term goal: eat more fruit       Psychosocial: Target Goals: Acknowledge presence or absence of significant depression and/or stress, maximize coping skills, provide positive support system. Participant is able to verbalize types and ability to use techniques and skills needed for reducing stress and depression.   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 Darrell [name]'s wife is staying with her Mom every night. 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 "That is hard"  Sleeps to about 3 AM, sometimes takes time to get back to sleep until up for the day at 545       Quality of Life Scores:  Quality of Life - 06/06/18 1255      Quality of Life   Select  Quality of Life      Quality of Life Scores   Health/Function Pre  6.1 %    Socioeconomic Pre  26.69 %    Psych/Spiritual Pre  14.29 %     Family Pre  21.6 %    GLOBAL Pre  14.66 %      Scores of 19 and below usually indicate a poorer quality of life in these areas.  A difference of  2-3 points is a clinically meaningful difference.  A difference of 2-3 points in the total score of the Quality of Life Index has been associated with significant improvement in overall quality of life, self-image, physical symptoms, and general health in studies assessing change in quality of life.  PHQ-9: Recent Review Flowsheet Data    Depression screen [facility] 2/9 06/30/2018 06/09/2018 06/06/2018 12/20/2017 12/16/2017   Decreased Interest 0 _0 Down, Depressed, Hopeless _1 PHQ - 2 Score _2 Altered sleeping 3  _0 Tired, decreased energy _1 Change in appetite 2 0 0 0 0   Feeling bad or failure about yourself  0 _2 Trouble concentrating 0 0 0 0 0   Moving slowly or fidgety/restless 0 0 0 0 0   Suicidal thoughts 0 0 0 0 0   PHQ-9 Score _3 Difficult doing work/chores Somewhat difficult - Somewhat difficult - Not difficult at all     Interpretation of Total Score  Total Score Depression Severity:  1-4 = Minimal depression, 5-9 = Mild depression, 10-14 = Moderate depression, 15-19 = Moderately severe depression, 20-27 = Severe depression   Psychosocial Evaluation and Intervention: Psychosocial Evaluation - 06/14/18 1025      Psychosocial Evaluation & Interventions   Interventions  Stress management education;Encouraged to exercise with the program and follow exercise prescription    Comments  Mr. 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  Counselor met with him today for initial psychosocial evaluation.   He is a 73 year old who has a family history of heart disease.  He has a strong support system with a spouse of 50 years; lots of family locally and active involvement with his local church.  Darrell [name] reports only getting maybe ~5 hours/night sleep.  He has a  history of OSA but is no longer using the CPAP once he lost weight.  He has a good appetite.  Darrell [name] denies a history of depression or anxiety or any current symptoms and states he is typically a positive person.  He has multiple stressors currently with his own health; his 73 year old mother-in-law is being cared for by Gerry's spouse; and Darrell [name] is the [place] which can be stressful at times as well.   He has goals to feel better overall; to lose some weight and increase his stamina and strength.  Staff will follow with Darrell [name].    Expected Outcomes  Short:  Darrell [name] will develop some positive coping strategies by attending the educational components of this program and learn to manage his stress better.  He will also meet with the dietician to address his weight loss goals.  Darrell [name] will benefit from exercising consistently for his health and mental health and as a coping strategy.  Long:  Darrell [name] will develop positive self-care habits from this program for his continued health and mental health.      Continue Psychosocial Services   Follow up required by staff       Psychosocial Re-Evaluation: Psychosocial Re-Evaluation    Row Name 06/30/18 4166 07/21/18 0816 08/30/18 0813         Psychosocial Re-Evaluation   Current issues with  Current Stress Concerns;Current Sleep Concerns  Current Stress Concerns;Current Sleep Concerns  Current Stress Concerns;Current Sleep Concerns     Comments  Darrell [name] will be going to visit his granddaughter for Christmas and will miss three weeks starting in mid [place].  He is looking forward to seeing them on Christmas.  He is not sleeping very well and got an extra 8mn at night.  Not sure what is triggering it.  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  He will need to work about 13 METs  to return.   He has not talked to anyone  about his sleep and is not  taking anything to help.  He is able to get to sleep its the sleeping long enough that is the problem. PHQ score has gone down by one point.  It was as low as 5 at the doctors office, but it is improving.   Darrell [name] had a lot of work to get done before his trip. His sleep is not good. He goes to bed at 11:30pm and was waking up around 3:22am. and now sleeping until 5 am, so it has improved some. He does not usually go back to sleep when he wakes in the middle of the night but will try our Relaxation techniques to return to sleep. He is excited for his trip to [place] and is staying in a hotel in walking distance of his sons house. He will stay active while he is there.  Darrell [name] has been doing well mentally.  He continues have a lot of stress through work.  He enjoyed his visit with his grandbaby for three weeks.  He was able to sleep well last night.  He is still waking up around 3am but has been able to get back to sleep more frequently.  He has a town Programmer, applications and those tend to keep him up more.      Expected Outcomes  Short: Continue to cope with job stress.  Long: Continue to work on sleep.   Short: Continue to do his jobs within his current capabilites. He is going to alarms but not doing anything active. He tells everyone to stay back. Long: Continue to work on sleep.  Short: Continue to cope with job stresses positiviely. Long: Continue to work on sleeping better.      Interventions  Stress management education;Encouraged to attend Cardiac Rehabilitation for the exercise  -  Stress management education;Encouraged to attend Cardiac Rehabilitation for the exercise     Continue Psychosocial Services   Follow up required by staff  -  Follow up required by staff     Comments  MOther in law in Skilled facility. Darrell [name]'s wife is staying with her Mom every night. 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 "That is hard"  Sleeps to about 3 AM, sometimes takes time to get back to  sleep until up for the day at 545  -  -       Initial Review   Source of Stress Concerns  Family;Occupation  -  -        Psychosocial Discharge (Final Psychosocial Re-Evaluation): Psychosocial Re-Evaluation - 08/30/18 0813      Psychosocial Re-Evaluation   Current issues with  Current Stress Concerns;Current Sleep Concerns    Comments  Darrell [name] has been doing well mentally.  He continues have a lot of stress through work.  He enjoyed his visit with his grandbaby for three weeks.  He was able to sleep well last night.  He is still waking up around 3am but has been able to get back to sleep more frequently.  He has a town Programmer, applications and those tend to keep him up more.     Expected Outcomes  Short: Continue to cope with job stresses positiviely. Long: Continue to work on sleeping better.     Interventions  Stress management education;Encouraged to attend Cardiac Rehabilitation for the exercise    Continue Psychosocial Services   Follow up required by staff  Vocational Rehabilitation: Provide vocational rehab assistance to qualifying candidates.   Vocational Rehab Evaluation & Intervention: Vocational Rehab - 06/06/18 1304      Initial Vocational Rehab Evaluation & Intervention   Assessment shows need for Vocational Rehabilitation  No       Education: Education Goals: Education classes will be provided on a variety of topics geared toward better understanding of heart health and risk factor modification. Participant will state understanding/return demonstration of topics presented as noted by education test scores.  Learning Barriers/Preferences: Learning Barriers/Preferences - 06/06/18 1303      Learning Barriers/Preferences   Learning Barriers  None    Learning Preferences  None       Education Topics:  AED/CPR: - Group verbal and written instruction with the use of models to demonstrate the basic use of the AED with the basic ABC's of  resuscitation.   General Nutrition Guidelines/Fats and Fiber: -Group instruction provided by verbal, written material, models and posters to present the general guidelines for heart healthy nutrition. Gives an explanation and review of dietary fats and fiber.   Cardiac Rehab from 01/04/2018 in [facility] Cardiac and Pulmonary Rehab  Date  11/09/17  Educator  PI  Instruction Review Code  1- Verbalizes Understanding      Controlling Sodium/Reading Food Labels: -Group verbal and written material supporting the discussion of sodium use in heart healthy nutrition. Review and explanation with models, verbal and written materials for utilization of the food label.   Cardiac Rehab from 09/13/2018 in [facility] Cardiac and Pulmonary Rehab  Date  09/01/18  Educator  LB  Instruction Review Code  1- Verbalizes Understanding      Exercise Physiology & General Exercise Guidelines: - Group verbal and written instruction with models to review the exercise physiology of the cardiovascular system and associated critical values. Provides general exercise guidelines with specific guidelines to those with heart or lung disease.    Cardiac Rehab from 09/13/2018 in [facility] Cardiac and Pulmonary Rehab  Date  08/30/18  Educator  LB  Instruction Review Code  1- Verbalizes Understanding      Aerobic Exercise & Resistance Training: - Gives group verbal and written instruction on the various components of exercise. Focuses on aerobic and resistive training programs and the benefits of this training and how to safely progress through these programs..   Cardiac Rehab from 09/13/2018 in [facility] Cardiac and Pulmonary Rehab  Date  09/08/18  Educator  N[place]olson  Instruction Review Code  1- Verbalizes Understanding      Flexibility, Balance, Mind/Body Relaxation: Provides group verbal/written instruction on the benefits of flexibility and balance training, including mind/body exercise modes such as yoga, pilates and tai chi.  Demonstration  and skill practice provided.   Cardiac Rehab from 09/13/2018 in [facility] Cardiac and Pulmonary Rehab  Date  09/13/18  Educator  AS  Instruction Review Code  1- Verbalizes Understanding      Stress and Anxiety: - Provides group verbal and written instruction about the health risks of elevated stress and causes of high stress.  Discuss the correlation between heart/lung disease and anxiety and treatment options. Review healthy ways to manage with stress and anxiety.   Cardiac Rehab from 01/04/2018 in [facility] Cardiac and Pulmonary Rehab  Date  12/14/17  Educator  [facility]  Instruction Review Code  1- Verbalizes Understanding      Depression: - Provides group verbal and written instruction on the correlation between heart/lung disease and depressed mood, treatment options, and the stigmas associated with  seeking treatment.   Cardiac Rehab from 09/13/2018 in [facility] Cardiac and Pulmonary Rehab  Date  08/23/18  Educator  [facility]  Instruction Review Code  1- Verbalizes Understanding      Anatomy & Physiology of the Heart: - Group verbal and written instruction and models provide basic cardiac anatomy and physiology, with the coronary electrical and arterial systems. Review of Valvular disease and Heart Failure   Cardiac Rehab from 01/04/2018 in [facility] Cardiac and Pulmonary Rehab  Date  12/16/17  Educator  CE  Instruction Review Code  1- Verbalizes Understanding      Cardiac Procedures: - Group verbal and written instruction to review commonly prescribed medications for heart disease. Reviews the medication, class of the drug, and [name] effects. Includes the steps to properly store meds and maintain the prescription regimen. (beta blockers and nitrates)   Cardiac Rehab from 09/13/2018 in [facility] Cardiac and Pulmonary Rehab  Date  07/26/18  Educator  SB  Instruction Review Code  1- Verbalizes Understanding      Cardiac Medications I: - Group verbal and written instruction to review commonly prescribed  medications for heart disease. Reviews the medication, class of the drug, and [name] effects. Includes the steps to properly store meds and maintain the prescription regimen.   Cardiac Rehab from 01/04/2018 in [facility] Cardiac and Pulmonary Rehab  Date  12/21/17  Educator  SB  Instruction Review Code  1- Verbalizes Understanding      Cardiac Medications II: -Group verbal and written instruction to review commonly prescribed medications for heart disease. Reviews the medication, class of the drug, and [name] effects. (all other drug classes)   Cardiac Rehab from 09/13/2018 in [facility] Cardiac and Pulmonary Rehab  Date  07/19/18  Educator  SB  Instruction Review Code  1- Verbalizes Understanding       Go Sex-Intimacy & Heart Disease, Get SMART - Goal Setting: - Group verbal and written instruction through game format to discuss heart disease and the return to sexual intimacy. Provides group verbal and written material to discuss and apply goal setting through the application of the S.M.A.R.T. Method.   Cardiac Rehab from 09/13/2018 in [facility] Cardiac and Pulmonary Rehab  Date  07/26/18  Educator  SB  Instruction Review Code  1- Verbalizes Understanding      Other Matters of the Heart: - Provides group verbal, written materials and models to describe Stable Angina and Peripheral Artery. Includes description of the disease process and treatment options available to the cardiac patient.   Cardiac Rehab from 01/04/2018 in [facility] Cardiac and Pulmonary Rehab  Date  12/16/17  Educator  CE  Instruction Review Code  1- Verbalizes Understanding      Exercise & Equipment Safety: - Individual verbal instruction and demonstration of equipment use and safety with use of the equipment.   Cardiac Rehab from 09/13/2018 in [facility] Cardiac and Pulmonary Rehab  Date  06/06/18  Educator  Sn  Instruction Review Code  1- Verbalizes Understanding      Infection Prevention: - Provides verbal and written material to  individual with discussion of infection control including proper hand washing and proper equipment cleaning during exercise session.   Cardiac Rehab from 09/13/2018 in [facility] Cardiac and Pulmonary Rehab  Date  06/06/18  Educator  Sb  Instruction Review Code  1- Verbalizes Understanding      Falls Prevention: - Provides verbal and written material to individual with discussion of falls prevention and safety.   Cardiac Rehab from 09/13/2018 in [facility]  Cardiac and Pulmonary Rehab  Date  06/06/18  Educator  Sb  Instruction Review Code  1- Verbalizes Understanding      Diabetes: - Individual verbal and written instruction to review signs/symptoms of diabetes, desired ranges of glucose level fasting, after meals and with exercise. Acknowledge that pre and post exercise glucose checks will be done for 3 sessions at entry of program.   Know Your Numbers and Risk Factors: -Group verbal and written instruction about important numbers in your health.  Discussion of what are risk factors and how they play a role in the disease process.  Review of Cholesterol, Blood Pressure, Diabetes, and BMI and the role they play in your overall health.   Cardiac Rehab from 09/13/2018 in [facility] Cardiac and Pulmonary Rehab  Date  07/19/18  Educator  SB  Instruction Review Code  1- Verbalizes Understanding      Sleep Hygiene: -Provides group verbal and written instruction about how sleep can affect your health.  Define sleep hygiene, discuss sleep cycles and impact of sleep habits. Review good sleep hygiene tips.    Cardiac Rehab from 09/13/2018 in [facility] Cardiac and Pulmonary Rehab  Date  06/28/18  Educator  [facility]  Instruction Review Code  1- Verbalizes Understanding      Other: -Provides group and verbal instruction on various topics (see comments)   Knowledge Questionnaire Score: Knowledge Questionnaire Score - 06/06/18 1303      Knowledge Questionnaire Score   Pre Score  24/26   reviewd correct responses with  Darrell [name] todauy. He verbalized understanding of the responses and had no further questions.       Core Components/Risk Factors/Patient Goals at Admission: Personal Goals and Risk Factors at Admission - 06/06/18 1305      Core Components/Risk Factors/Patient Goals on Admission    Weight Management  Yes;Obesity;Weight Loss    Admit Weight  214 lb (97.1 kg)    Goal Weight: Short Term  212 lb (96.2 kg)    Goal Weight: Long Term  190 lb (86.2 kg)    Expected Outcomes  Short Term: Continue to assess and modify interventions until short term weight is achieved;Long Term: Adherence to nutrition and physical activity/exercise program aimed toward attainment of established weight goal;Weight Loss: Understanding of general recommendations for a balanced deficit meal plan, which promotes 1-2 lb weight loss per week and includes a negative energy balance of [phone] kcal/d    Hypertension  Yes    Intervention  Provide education on lifestyle modifcations including regular physical activity/exercise, weight management, moderate sodium restriction and increased consumption of fresh fruit, vegetables, and low fat dairy, alcohol moderation, and smoking cessation.;Monitor prescription use compliance.    Expected Outcomes  Short Term: Continued assessment and intervention until BP is < 140/44m HG in hypertensive participants. < 130/817mHG in hypertensive participants with diabetes, heart failure or chronic kidney disease.;Long Term: Maintenance of blood pressure at goal levels.    Lipids  Yes    Intervention  Provide education and support for participant on nutrition & aerobic/resistive exercise along with prescribed medications to achieve LDL <70110mHDL >77m62m  Expected Outcomes  Short Term: Participant states understanding of desired cholesterol values and is compliant with medications prescribed. Participant is following exercise prescription and nutrition guidelines.;Long Term: Cholesterol controlled with  medications as prescribed, with individualized exercise RX and with personalized nutrition plan. Value goals: LDL < 70mg66mL > 40 mg.       Core Components/Risk Factors/Patient Goals Review:  Goals  and Risk Factor Review    Row Name 06/30/18 7342 07/21/18 0824 08/30/18 0815         Core Components/Risk Factors/Patient Goals Review   Personal Goals Review  Weight Management/Obesity;Hypertension;Lipids  Weight Management/Obesity;Hypertension;Lipids  Weight Management/Obesity;Hypertension;Lipids     Review  Darrell [name]'s weight is climbing again.  He is eating but not always what is healthy.  He is doing well with his blood pressures other than the other day when he missed his medication in the morning.  He checks it at home twice a day.  He is doing well on his medications and may adjust his blood pressure meds some as it sometimes is slightly elevated.   We are going to add some more Interval training to try to give him a jump start his weight loss. Blood pressure has been high. He takes his meds regularly. We will send a note with BP's to Dr. [name] to see if he wants to make meds changes.   Darrell [name] is still not losing as much weight as he would like.  He is cheating on his diet some and went out last night too.  The intervals are helping and he is doing them at the station as well.  Blood pressures are starting to get better since starting his pravastatin and trying to get his cholesterol numbers even lower.  Overall, he is doing well.      Expected Outcomes  Short: Get in more cardio to help with weight loss.  Long: Continue to monitor blood pressures.   Short: Start HIIT on cardio equipment.  Long: Continue to take meds and monitor blood pressure.   Short: Continue to work on weight loss.  Long: Continue to keep close eye on pressures.         Core Components/Risk Factors/Patient Goals at Discharge (Final Review):  Goals and Risk Factor Review - 08/30/18 0815      Core Components/Risk  Factors/Patient Goals Review   Personal Goals Review  Weight Management/Obesity;Hypertension;Lipids    Review  Darrell [name] is still not losing as much weight as he would like.  He is cheating on his diet some and went out last night too.  The intervals are helping and he is doing them at the station as well.  Blood pressures are starting to get better since starting his pravastatin and trying to get his cholesterol numbers even lower.  Overall, he is doing well.     Expected Outcomes  Short: Continue to work on weight loss.  Long: Continue to keep close eye on pressures.        ITP Comments: ITP Comments    Row Name 06/06/18 1240 06/22/18 0559 06/28/18 0818 07/20/18 0607 07/27/18 1433   ITP Comments  Medical review completed today. New diagnosis new program start.  ITP sent to Dr Sabra Heck for review, changes as needed and signature. Documentation of diagnosis can be found in [facility] 9/03 visit  30 day review. Continue with ITP unless direccted changes per Medical Director Chart Review. New to program  Darrell [name]'s blood pressure was elevated when he came in this morning (200/102).  He sat for a while and was able to come down to the 170s.  He did the T5 and treadmill today without any problems.   30 day review. Continue with ITP unless direccted changes per Medical Director Chart Review.  Darrell [name] will be out for three weeks going to visit his grands in [place] for Christmas.    Trooper Name 08/16/18 [phone] 09/14/18 [phone]  ITP Comments  30 Day Review. Continue with ITP unless directed changes per Medical Director review  30 Day Review. Continue with ITP unless directed changes per Medical Director review         Comments: 30 day review

## 2018-09-15 DIAGNOSIS — Z08 Encounter for follow-up examination after completed treatment for malignant neoplasm: Secondary | ICD-10-CM | POA: Diagnosis not present

## 2018-09-15 DIAGNOSIS — X32XXXA Exposure to sunlight, initial encounter: Secondary | ICD-10-CM | POA: Diagnosis not present

## 2018-09-15 DIAGNOSIS — L400 Psoriasis vulgaris: Secondary | ICD-10-CM | POA: Diagnosis not present

## 2018-09-15 DIAGNOSIS — L570 Actinic keratosis: Secondary | ICD-10-CM | POA: Diagnosis not present

## 2018-09-15 DIAGNOSIS — I781 Nevus, non-neoplastic: Secondary | ICD-10-CM | POA: Diagnosis not present

## 2018-09-15 DIAGNOSIS — Z85828 Personal history of other malignant neoplasm of skin: Secondary | ICD-10-CM | POA: Diagnosis not present

## 2018-09-15 DIAGNOSIS — Z85820 Personal history of malignant melanoma of skin: Secondary | ICD-10-CM | POA: Diagnosis not present

## 2018-09-15 DIAGNOSIS — R58 Hemorrhage, not elsewhere classified: Secondary | ICD-10-CM | POA: Diagnosis not present

## 2018-09-16 DIAGNOSIS — L400 Psoriasis vulgaris: Secondary | ICD-10-CM | POA: Diagnosis not present

## 2018-09-16 DIAGNOSIS — Z5181 Encounter for therapeutic drug level monitoring: Secondary | ICD-10-CM | POA: Diagnosis not present

## 2018-09-20 ENCOUNTER — Encounter: Payer: Medicare Other | Attending: Internal Medicine | Admitting: *Deleted

## 2018-09-20 DIAGNOSIS — L409 Psoriasis, unspecified: Secondary | ICD-10-CM | POA: Insufficient documentation

## 2018-09-20 DIAGNOSIS — Z951 Presence of aortocoronary bypass graft: Secondary | ICD-10-CM

## 2018-09-20 DIAGNOSIS — I2510 Atherosclerotic heart disease of native coronary artery without angina pectoris: Secondary | ICD-10-CM | POA: Diagnosis not present

## 2018-09-20 DIAGNOSIS — Z87891 Personal history of nicotine dependence: Secondary | ICD-10-CM | POA: Insufficient documentation

## 2018-09-20 DIAGNOSIS — E785 Hyperlipidemia, unspecified: Secondary | ICD-10-CM | POA: Insufficient documentation

## 2018-09-20 DIAGNOSIS — Z8546 Personal history of malignant neoplasm of prostate: Secondary | ICD-10-CM | POA: Diagnosis not present

## 2018-09-20 DIAGNOSIS — Z79899 Other long term (current) drug therapy: Secondary | ICD-10-CM | POA: Diagnosis not present

## 2018-09-20 DIAGNOSIS — I10 Essential (primary) hypertension: Secondary | ICD-10-CM | POA: Insufficient documentation

## 2018-09-20 DIAGNOSIS — Z7902 Long term (current) use of antithrombotics/antiplatelets: Secondary | ICD-10-CM | POA: Diagnosis not present

## 2018-09-20 DIAGNOSIS — Z7982 Long term (current) use of aspirin: Secondary | ICD-10-CM | POA: Diagnosis not present

## 2018-09-20 DIAGNOSIS — Z7901 Long term (current) use of anticoagulants: Secondary | ICD-10-CM | POA: Diagnosis not present

## 2018-09-20 DIAGNOSIS — I629 Nontraumatic intracranial hemorrhage, unspecified: Secondary | ICD-10-CM | POA: Insufficient documentation

## 2018-09-20 NOTE — Progress Notes (Signed)
Daily Session Note  Patient Details  Name: Darrell [name] MRN: [id number] Date of Birth: 22-Dec-1945 Referring Provider:     Cardiac Rehab from 06/06/2018 in [facility] Cardiac and Pulmonary Rehab  Referring Provider  Serafina Royals MD      Encounter Date: 09/20/2018  Check In: Session Check In - 09/20/18 0756      Check-In   Supervising physician immediately available to respond to emergencies  See telemetry face sheet for immediately available ER MD    Location  [facility]-Cardiac & Pulmonary Rehab    Staff Present  Heath Lark, RN, BSN, CCRP;[name] BS, Exercise Physiologist;Amanda Oletta Darter, BA, ACSM CEP, Exercise Physiologist    Medication changes reported      No    Fall or balance concerns reported     No    Warm-up and Cool-down  Performed as group-led instruction    Resistance Training Performed  Yes    VAD Patient?  No    PAD/SET Patient?  No      Pain Assessment   Currently in Pain?  No/denies          Social History   Tobacco Use  Smoking Status Former Smoker  . Types: Cigarettes  . Last attempt to quit: 1989  . Years since quitting: 31.1  Smokeless Tobacco Never Used  Tobacco Comment   quit 30 years ago     Goals Met:  Independence with exercise equipment Exercise tolerated well No report of cardiac concerns or symptoms Strength training completed today  Goals Unmet:  Not Applicable  Comments: Pt able to follow exercise prescription today without complaint.  Will continue to monitor for progression.    Dr. Emily Filbert is Medical Director for [facility].

## 2018-09-22 ENCOUNTER — Encounter: Payer: Medicare Other | Admitting: *Deleted

## 2018-09-22 DIAGNOSIS — Z79899 Other long term (current) drug therapy: Secondary | ICD-10-CM | POA: Diagnosis not present

## 2018-09-22 DIAGNOSIS — Z7982 Long term (current) use of aspirin: Secondary | ICD-10-CM | POA: Diagnosis not present

## 2018-09-22 DIAGNOSIS — Z7901 Long term (current) use of anticoagulants: Secondary | ICD-10-CM | POA: Diagnosis not present

## 2018-09-22 DIAGNOSIS — I255 Ischemic cardiomyopathy: Secondary | ICD-10-CM | POA: Diagnosis not present

## 2018-09-22 DIAGNOSIS — I493 Ventricular premature depolarization: Secondary | ICD-10-CM | POA: Insufficient documentation

## 2018-09-22 DIAGNOSIS — I480 Paroxysmal atrial fibrillation: Secondary | ICD-10-CM | POA: Diagnosis not present

## 2018-09-22 DIAGNOSIS — I10 Essential (primary) hypertension: Secondary | ICD-10-CM | POA: Diagnosis not present

## 2018-09-22 DIAGNOSIS — I25118 Atherosclerotic heart disease of native coronary artery with other forms of angina pectoris: Secondary | ICD-10-CM | POA: Diagnosis not present

## 2018-09-22 DIAGNOSIS — Z7902 Long term (current) use of antithrombotics/antiplatelets: Secondary | ICD-10-CM | POA: Diagnosis not present

## 2018-09-22 DIAGNOSIS — I2510 Atherosclerotic heart disease of native coronary artery without angina pectoris: Secondary | ICD-10-CM | POA: Diagnosis not present

## 2018-09-22 DIAGNOSIS — Z951 Presence of aortocoronary bypass graft: Secondary | ICD-10-CM | POA: Diagnosis not present

## 2018-09-22 DIAGNOSIS — I25810 Atherosclerosis of coronary artery bypass graft(s) without angina pectoris: Secondary | ICD-10-CM | POA: Diagnosis not present

## 2018-09-22 DIAGNOSIS — E782 Mixed hyperlipidemia: Secondary | ICD-10-CM | POA: Diagnosis not present

## 2018-09-22 NOTE — Progress Notes (Signed)
Daily Session Note  Patient Details  Name: KYSEAN SWEET MRN: [id number] Date of Birth: June 26, 1946 Referring Provider:     Cardiac Rehab from 06/06/2018 in [facility] Cardiac and Pulmonary Rehab  Referring Provider  Serafina Royals MD      Encounter Date: 09/22/2018  Check In: Session Check In - 09/22/18 0752      Check-In   Supervising physician immediately available to respond to emergencies  See telemetry face sheet for immediately available ER MD    Location  [facility]-Cardiac & Pulmonary Rehab    Staff Present  Jasper Loser BS, Exercise Physiologist;Carroll Enterkin, RN, Levie Heritage, MA, RCEP, CCRP, Exercise Physiologist    Medication changes reported      No    Fall or balance concerns reported     No    Warm-up and Cool-down  Performed as group-led instruction    Resistance Training Performed  Yes    VAD Patient?  No    PAD/SET Patient?  No      Pain Assessment   Currently in Pain?  No/denies          Social History   Tobacco Use  Smoking Status Former Smoker  . Types: Cigarettes  . Last attempt to quit: 1989  . Years since quitting: 31.1  Smokeless Tobacco Never Used  Tobacco Comment   quit 30 years ago     Goals Met:  Independence with exercise equipment Exercise tolerated well No report of cardiac concerns or symptoms Strength training completed today  Goals Unmet:  Not Applicable  Comments: Pt able to follow exercise prescription today without complaint.  Will continue to monitor for progression.    Dr. Emily Filbert is Medical Director for [facility].

## 2018-09-29 ENCOUNTER — Encounter: Payer: Medicare Other | Admitting: *Deleted

## 2018-09-29 DIAGNOSIS — Z7901 Long term (current) use of anticoagulants: Secondary | ICD-10-CM | POA: Diagnosis not present

## 2018-09-29 DIAGNOSIS — Z951 Presence of aortocoronary bypass graft: Secondary | ICD-10-CM | POA: Diagnosis not present

## 2018-09-29 DIAGNOSIS — Z7902 Long term (current) use of antithrombotics/antiplatelets: Secondary | ICD-10-CM | POA: Diagnosis not present

## 2018-09-29 DIAGNOSIS — I2510 Atherosclerotic heart disease of native coronary artery without angina pectoris: Secondary | ICD-10-CM | POA: Diagnosis not present

## 2018-09-29 DIAGNOSIS — Z7982 Long term (current) use of aspirin: Secondary | ICD-10-CM | POA: Diagnosis not present

## 2018-09-29 DIAGNOSIS — Z79899 Other long term (current) drug therapy: Secondary | ICD-10-CM | POA: Diagnosis not present

## 2018-09-29 NOTE — Progress Notes (Signed)
Daily Session Note  Patient Details  Name: Darrell [name] MRN: [id number] Date of Birth: 08/31/1945 Referring Provider:     Cardiac Rehab from 06/06/2018 in [facility] Cardiac and Pulmonary Rehab  Referring Provider  Serafina Royals MD      Encounter Date: 09/29/2018  Check In: Session Check In - 09/29/18 0755      Check-In   Supervising physician immediately available to respond to emergencies  See telemetry face sheet for immediately available ER MD    Location  [facility]-Cardiac & Pulmonary Rehab    Staff Present  Jasper Loser BS, Exercise Physiologist;Carroll Enterkin, RN, BSN    Medication changes reported      No    Fall or balance concerns reported     No    Warm-up and Cool-down  Performed as group-led instruction    Resistance Training Performed  Yes    VAD Patient?  No    PAD/SET Patient?  No      Pain Assessment   Currently in Pain?  No/denies          Social History   Tobacco Use  Smoking Status Former Smoker  . Types: Cigarettes  . Last attempt to quit: 1989  . Years since quitting: 31.1  Smokeless Tobacco Never Used  Tobacco Comment   quit 30 years ago     Goals Met:  Independence with exercise equipment Exercise tolerated well No report of cardiac concerns or symptoms Strength training completed today  Goals Unmet:  Not Applicable  Comments: Pt able to follow exercise prescription today without complaint.  Will continue to monitor for progression.    Dr. Emily Filbert is Medical Director for [facility] and [facility].

## 2018-10-05 NOTE — Patient Instructions (Signed)
Discharge Patient Instructions  Patient Details  Name: Darrell [name] MRN: [id number] Date of Birth: 1946-02-10 Referring Provider:  Corey Skains, MD   Number of Visits: 36  Reason for Discharge:  Patient reached a stable level of exercise. Patient independent in their exercise. Patient has met program and personal goals.  Smoking History:  Social History   Tobacco Use  Smoking Status Former Smoker  . Types: Cigarettes  . Last attempt to quit: 1989  . Years since quitting: 31.1  Smokeless Tobacco Never Used  Tobacco Comment   quit 30 years ago     Diagnosis:  S/P CABG x 2  Initial Exercise Prescription: Initial Exercise Prescription - 06/06/18 1300      Date of Initial Exercise RX and Referring Provider   Date  06/06/18    Referring Provider  Serafina Royals MD      Treadmill   MPH  2.3    Grade  0.5    Minutes  15    METs  2.93      Elliptical   Level  1    Speed  3    Minutes  15      T5 Nustep   Level  3    SPM  80    Minutes  15    METs  3      Prescription Details   Frequency (times per week)  2    Duration  Progress to 30 minutes of continuous aerobic without signs/symptoms of physical distress      Intensity   THRR 40-80% of Max Heartrate  104-135    Ratings of Perceived Exertion  11-13    Perceived Dyspnea  0-4      Progression   Progression  Continue to progress workloads to maintain intensity without signs/symptoms of physical distress.      Resistance Training   Training Prescription  Yes    Weight  4 lbs    Reps  10-15       Discharge Exercise Prescription (Final Exercise Prescription Changes): Exercise Prescription Changes - 10/05/18 0900      Response to Exercise   Blood Pressure (Admit)  122/68    Blood Pressure (Exercise)  128/70    Blood Pressure (Exit)  122/80    Heart Rate (Admit)  70 bpm    Heart Rate (Exercise)  89 bpm    Heart Rate (Exit)  67 bpm    Rating of Perceived Exertion (Exercise)  12    Symptoms   none    Duration  Continue with 30 min of aerobic exercise without signs/symptoms of physical distress.    Intensity  THRR unchanged      Progression   Progression  Continue to progress workloads to maintain intensity without signs/symptoms of physical distress.    Average METs  4.21      Resistance Training   Training Prescription  Yes    Weight  10 lbs    Reps  10-15      Interval Training   Interval Training  No      Treadmill   MPH  2.5    Grade  5    Minutes  15    METs  4.64      Elliptical   Level  4    Speed  4    Minutes  15      T5 Nustep   Level  5    Minutes  15    METs  3.6      Home Exercise Plan   Plans to continue exercise at  [employer] (comment)   walking, [employer], firehouse gym   Frequency  Add 3 additional days to program exercise sessions.    Initial Home Exercises Provided  06/14/18       Functional Capacity: 6 Minute Walk    Row Name 06/06/18 1348 09/13/18 0840       6 Minute Walk   Phase  Initial  Discharge    Distance  1520 feet  1700 feet    Distance % Change  -  11.8 %    Distance Feet Change  -  180 ft    Walk Time  6 minutes  6 minutes    # of Rest Breaks  0  0    MPH  2.88  3.22    METS  2.91  3.72    RPE  11  12    VO2 Peak  10.18  13    Symptoms  No  No    Resting HR  74 bpm  64 bpm    Resting BP  108/64  148/86    Resting Oxygen Saturation   97 %  -    Exercise Oxygen Saturation  during 6 min walk  98 %  97 %    Max Ex. HR  115 bpm  105 bpm    Max Ex. BP  174/76  190/82    2 Minute Post BP  126/62  -       Quality of Life: Quality of Life - 09/20/18 1211      Quality of Life Scores   Health/Function Pre  6.1 %    Health/Function Post  14 %    Health/Function % Change  129.51 %    Socioeconomic Pre  26.69 %    Socioeconomic Post  24 %    Socioeconomic % Change   -10.08 %    Psych/Spiritual Pre  14.29 %    Psych/Spiritual Post  21.43 %    Psych/Spiritual % Change  49.97 %    Family Pre  21.6  %    Family Post  26.4 %    Family % Change  22.22 %    GLOBAL Pre  14.66 %    GLOBAL Post  19.54 %    GLOBAL % Change  33.29 %       Personal Goals: Goals established at orientation with interventions provided to work toward goal. Personal Goals and Risk Factors at Admission - 06/06/18 1305      Core Components/Risk Factors/Patient Goals on Admission    Weight Management  Yes;Obesity;Weight Loss    Admit Weight  214 lb (97.1 kg)    Goal Weight: Short Term  212 lb (96.2 kg)    Goal Weight: Long Term  190 lb (86.2 kg)    Expected Outcomes  Short Term: Continue to assess and modify interventions until short term weight is achieved;Long Term: Adherence to nutrition and physical activity/exercise program aimed toward attainment of established weight goal;Weight Loss: Understanding of general recommendations for a balanced deficit meal plan, which promotes 1-2 lb weight loss per week and includes a negative energy balance of [phone] kcal/d    Hypertension  Yes    Intervention  Provide education on lifestyle modifcations including regular physical activity/exercise, weight management, moderate sodium restriction and increased consumption of fresh fruit, vegetables, and low fat dairy, alcohol moderation, and smoking cessation.;Monitor prescription use compliance.  Expected Outcomes  Short Term: Continued assessment and intervention until BP is < 140/29m HG in hypertensive participants. < 130/860mHG in hypertensive participants with diabetes, heart failure or chronic kidney disease.;Long Term: Maintenance of blood pressure at goal levels.    Lipids  Yes    Intervention  Provide education and support for participant on nutrition & aerobic/resistive exercise along with prescribed medications to achieve LDL <7023mHDL >5m69m  Expected Outcomes  Short Term: Participant states understanding of desired cholesterol values and is compliant with medications prescribed. Participant is following exercise  prescription and nutrition guidelines.;Long Term: Cholesterol controlled with medications as prescribed, with individualized exercise RX and with personalized nutrition plan. Value goals: LDL < 70mg46mL > 40 mg.        Personal Goals Discharge: Goals and Risk Factor Review - 09/20/18 0825      Core Components/Risk Factors/Patient Goals Review   Personal Goals Review  Weight Management/Obesity;Hypertension;Lipids    Review  Darrell Sidenot lost weight but is maintaining. He is working on strenEditor, commissioninguild muscle mass.  (with permission from his Dr).   He is eating smaller portions but not always the right things.        Expected Outcomes  Short - continue to exercise and use intervals Long - continue to work on  building strength       Exercise Goals and Review: Exercise Goals    Row Name 06/06/18 1351             Exercise Goals   Increase Physical Activity  Yes       Intervention  Provide advice, education, support and counseling about physical activity/exercise needs.;Develop an individualized exercise prescription for aerobic and resistive training based on initial evaluation findings, risk stratification, comorbidities and participant's personal goals.       Expected Outcomes  Short Term: Attend rehab on a regular basis to increase amount of physical activity.;Long Term: Add in home exercise to make exercise part of routine and to increase amount of physical activity.;Long Term: Exercising regularly at least 3-5 days a week.       Increase Strength and Stamina  Yes       Intervention  Provide advice, education, support and counseling about physical activity/exercise needs.;Develop an individualized exercise prescription for aerobic and resistive training based on initial evaluation findings, risk stratification, comorbidities and participant's personal goals.       Expected Outcomes  Short Term: Perform resistance training exercises routinely during rehab and add in resistance  training at home;Long Term: Improve cardiorespiratory fitness, muscular endurance and strength as measured by increased METs and functional capacity (6MWT);Short Term: Increase workloads from initial exercise prescription for resistance, speed, and METs.       Able to understand and use rate of perceived exertion (RPE) scale  Yes       Intervention  Provide education and explanation on how to use RPE scale       Expected Outcomes  Short Term: Able to use RPE daily in rehab to express subjective intensity level;Long Term:  Able to use RPE to guide intensity level when exercising independently       Knowledge and understanding of Target Heart Rate Range (THRR)  Yes       Intervention  Provide education and explanation of THRR including how the numbers were predicted and where they are located for reference       Expected Outcomes  Short Term: Able to state/look up THRR;Long  Term: Able to use THRR to govern intensity when exercising independently;Short Term: Able to use daily as guideline for intensity in rehab       Able to check pulse independently  Yes       Intervention  Provide education and demonstration on how to check pulse in carotid and radial arteries.;Review the importance of being able to check your own pulse for safety during independent exercise       Expected Outcomes  Short Term: Able to explain why pulse checking is important during independent exercise;Long Term: Able to check pulse independently and accurately       Understanding of Exercise Prescription  Yes       Intervention  Provide education, explanation, and written materials on patient's individual exercise prescription       Expected Outcomes  Short Term: Able to explain program exercise prescription;Long Term: Able to explain home exercise prescription to exercise independently          Exercise Goals Re-Evaluation: Exercise Goals Re-Evaluation    Row Name 06/14/18 0917 06/14/18 1024 06/28/18 1618 06/30/18 0805 07/11/18  1634     Exercise Goal Re-Evaluation   Exercise Goals Review  Increase Physical Activity;Increase Strength and Stamina;Able to understand and use rate of perceived exertion (RPE) scale;Knowledge and understanding of Target Heart Rate Range (THRR);Understanding of Exercise Prescription  Increase Physical Activity;Increase Strength and Stamina;Understanding of Exercise Prescription  Increase Physical Activity;Increase Strength and Stamina;Understanding of Exercise Prescription  Increase Physical Activity;Increase Strength and Stamina;Understanding of Exercise Prescription  Increase Physical Activity;Increase Strength and Stamina;Understanding of Exercise Prescription   Comments  Reviewed RPE scale, THR and program prescription with pt today.  Pt voiced understanding and was given a copy of goals to take home.   Reviewed home exercise guidelines with Sonia [name].  He will continue to go to [employer] and walk.  He also has access to gym at fire house.   Sonia [name] is off to a good start in rehab.  He has completed his second full day of exercise. Today, he had some issues with his blood pressure being high coming in the door, but was able to exercise.  We will continue to monitor his progression.   Sonia [name] is doing well in rehab.  He has been walking around town as they wouldn't let him go to gym.  We talked about just going in to do his cardio and staying off the weight equipment.  He recovered from surgery quickly, but is leveling out on his strength now.  He has not had any afib symptoms and just a little pain at the insicion site.    Sonia [name] continues to do well in rehab. He comes when his work schedule allows, so he bounces around the different classes.  He is up to level 5 on the T5 NuStep.  We will continue to monitor his progression.    Expected Outcomes  Short: Use RPE daily to regulate intensity. Long: Follow program prescription in THR.  Short: Continue to exericse more regularly.  Long: Continue to increase  activity and stamina.   Short: Attend class regularly.  Long: Continue to exercies independently on off days.   Short: Start adding in more cardio at the gym.  Long: Continue to stay active on off days.   Short: Continue to increase workloads.  Long: Continue to add in cardio on off days.    [place] Name 07/21/18 [phone] 07/27/18 1434 08/09/18 0959 08/23/18 1610 08/30/18 0812     Exercise Goal Re-Evaluation  Exercise Goals Review  Increase Physical Activity;Increase Strength and Stamina;Able to check pulse independently  Increase Physical Activity;Increase Strength and Stamina;Able to check pulse independently  -  Increase Physical Activity;Increase Strength and Stamina;Able to check pulse independently  Increase Physical Activity;Increase Strength and Stamina;Able to check pulse independently   Comments  Sonia [name] continues do very well in rehab. He is doing cardio walking and using the Elliptical at [employer]. He is not using weights there. He is walking in the parade this weekend.  Sonia [name] is up to level on the elliptical now.  He continues to do well.  He is planning to walk while out on vacation to [place].  We will continue to monitor his progress.   Out since last review  Sonia [name] returned today after his trip to [place].  He enjoyed his trip and was able to return to his normal workloads.  We will continue to monitor his progress.   Sonia [name] is doing well in rehab. 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 He is starting to get his strength and stamina back.  He is still working his way back up with his strength since he cut it in half after surgery.     Expected Outcomes  Short: Continue to workout 2-3 extra days. Long: Sonia [name] would like to add weight machines back to his workout regimen. Start with 15 lbs and work up slowly.  Short: Walk while out on vacation.  Long: Continue to regain strength back.   -  Short: Attend classes regularly again.  Long: Continue to exericse at the  station on off days.   Short: Continue to get to class regularly.  Long: Continue to get in his exercise especially cardio.    [place] Name 09/07/18 1335 09/20/18 0841 10/05/18 0905         Exercise Goal Re-Evaluation   Exercise Goals Review  Increase Physical Activity;Increase Strength and Stamina;Able to check pulse independently  Increase Physical Activity;Increase Strength and Stamina;Able to understand and use rate of perceived exertion (RPE) scale;Knowledge and understanding of Target Heart Rate Range (THRR);Understanding of Exercise Prescription  Increase Physical Activity;Increase Strength and Stamina;Understanding of Exercise Prescription     Comments  Sonia [name] continues to do well in rehab.  He is up to 3.4 METs on the T5 NuStep.  We will continue to monitor his progress.   Sonia [name] plans to continue at [employer] for exercise.  He has clearancce from his Dr to do more strength work and wants to build back muscle he feels he has lost.  He is considering going to the [employer] and working with  Physiological scientist .  Sonia [name] will be graduating at his next visit.  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     Expected Outcomes  Short: Increase treadmill and talk about intervals.  Long: Continue to exercise regularly  Short - get info about [facility] and training Long - safely build muscle mass   Short: Graduate!!  Long: Continue to exercise independently!!        Nutrition & Weight - Outcomes: Pre Biometrics - 06/06/18 1353      Pre Biometrics   Height  5' 8.1" (1.73 m)  (Pended)     Weight  222 lb 11.2 oz (101 kg)  (Pended)     Waist Circumference  42 inches  (Pended)     Hip Circumference  43 inches  (Pended)  Waist to Hip Ratio  0.98 %  (Pended)     BMI (Calculated)  33.75  (Pended)     Single Leg Stand  30 seconds  (Pended)       Post Biometrics - 09/13/18 0839       Post  Biometrics   Height  5' 8.1" (1.73 m)    Weight  226 lb (102.5 kg)    Waist  Circumference  43 inches    Hip Circumference  43.5 inches    Waist to Hip Ratio  0.99 %    BMI (Calculated)  34.25    Single Leg Stand  30 seconds       Nutrition: Nutrition Therapy & Goals - 06/14/18 1007      Nutrition Therapy   Diet  DASH    Drug/Food Interactions  Statins/Certain Fruits    Protein (specify units)  9oz    Fiber  30 grams    Whole Grain Foods  3 servings   chooses whole grains half of the time   Saturated Fats  16 max. grams    Fruits and Vegetables  5 servings/day   8 ideal; eats vegetables regularly but not fruits   Sodium  1500 grams      Personal Nutrition Goals   Nutrition Goal  Increase fruit intake by at least one additional serving per week    Personal Goal #2  Increase fluid intake by at least one additional 8oz glass per day. Try adding extra fluids around exercise times first    Comments  His wife has been helping to change his diet by cooking without salt, buying low sodium products and decreasing portion sizes at meal times. She provides vegetables and whole grain choices regularly. They do not eat out often but he does choose fried foods when eating out occasionally. They eat a variety of protein sources and they do not fry foods at home. They ask for french fries without salt when eating at [facility]. Eats 3 meals/day; breakfast: cheerios with 2% milk, lunch: leftovers, sandwich, [employer], dinner: varies (soups, roast, chicken, shrimp, wheat pasta, brown rice, beef tips, salads, vegetables, baked potato). He drinks mostly water with the occasional sweet tea or diluted orange juice. He does not eat fruit regularly but does eat vegetables that his wife cooks      [place], educate and counsel regarding individualized specific dietary modifications aiming towards targeted core components such as weight, hypertension, lipid management, diabetes, heart failure and other comorbidities.    Expected Outcomes  Short Term Goal:  Understand basic principles of dietary content, such as calories, fat, sodium, cholesterol and nutrients.;Short Term Goal: A plan has been developed with personal nutrition goals set during dietitian appointment.;Long Term Goal: Adherence to prescribed nutrition plan.       Nutrition Discharge: Nutrition Assessments - 06/06/18 1302      MEDFICTS Scores   Pre Score  42       Education Questionnaire Score: Knowledge Questionnaire Score - 06/06/18 1303      Knowledge Questionnaire Score   Pre Score  24/26   reviewd correct responses with Sonia [name] todauy. He verbalized understanding of the responses and had no further questions.       Goals reviewed with patient; copy given to patient.

## 2018-10-12 ENCOUNTER — Encounter: Payer: Self-pay | Admitting: *Deleted

## 2018-10-12 DIAGNOSIS — Z951 Presence of aortocoronary bypass graft: Secondary | ICD-10-CM

## 2018-10-12 NOTE — Progress Notes (Signed)
Cardiac Individual Treatment Plan  Patient Details  Name: Darrell [name] MRN: [id number] Date of Birth: 04-03-1946 Referring Provider:     Cardiac Rehab from 06/06/2018 in [facility] Cardiac and Pulmonary Rehab  Referring Provider  Serafina Royals MD      Initial Encounter Date:    Cardiac Rehab from 06/06/2018 in [facility] Cardiac and Pulmonary Rehab  Date  06/06/18      Visit Diagnosis: S/P CABG x 2  Patient's Home Medications on Admission:  Current Outpatient Medications:  .  allopurinol (ZYLOPRIM) 300 MG tablet, Take 0.5 tablets (150 mg total) by mouth daily., Disp: 90 tablet, Rfl: 4 .  apixaban (ELIQUIS) 5 MG TABS tablet, Take 5 mg by mouth 2 (two) times daily. , Disp: , Rfl:  .  aspirin EC 81 MG tablet, Take by mouth., Disp: , Rfl:  .  atorvastatin (LIPITOR) 80 MG tablet, Take 80 mg by mouth daily., Disp: , Rfl:  .  carvedilol (COREG) 6.25 MG tablet, Take by mouth., Disp: , Rfl:  .  hydrochlorothiazide (HYDRODIURIL) 25 MG tablet, Take 1 tablet (25 mg total) by mouth daily., Disp: 90 tablet, Rfl: 4 .  pravastatin (PRAVACHOL) 40 MG tablet, Take 1 tablet by mouth 1 day or 1 dose., Disp: , Rfl:  .  telmisartan (MICARDIS) 80 MG tablet, Take by mouth., Disp: , Rfl:   Past Medical History: Past Medical History:  Diagnosis Date  . CAD (coronary artery disease)   . Cancer [facility])    prostate  . Carotid artery stenosis   . Hyperlipidemia   . Hypertension   . Psoriasis     Tobacco Use: Social History   Tobacco Use  Smoking Status Former Smoker  . Types: Cigarettes  . Last attempt to quit: 1989  . Years since quitting: 31.1  Smokeless Tobacco Never Used  Tobacco Comment   quit 30 years ago     Labs: Recent Review Flowsheet Data    Labs for ITP Cardiac and Pulmonary Rehab Latest Ref Rng & Units 12/17/2016 05/20/2017 06/09/2017 12/20/2017 09/07/2018   Cholestrol <200 mg/dL 104 - 106 134 114   LDLCALC 0 - 99 mg/dL 39 - 42 44 -   HDL >39 mg/dL 41 - 41 39(L) -   Trlycerides <150  mg/dL 122 - 114 257(H) 129   Hemoglobin A1c 4.8 - 5.6 % 6.0 5.6 - - -       Exercise Target Goals: Exercise Program Goal: Individual exercise prescription set using results from initial 6 min walk test and THRR while considering  patient's activity barriers and safety.   Exercise Prescription Goal: Initial exercise prescription builds to 30-45 minutes a day of aerobic activity, 2-3 days per week.  Home exercise guidelines will be given to patient during program as part of exercise prescription that the participant will acknowledge.  Activity Barriers & Risk Stratification: Activity Barriers & Cardiac Risk Stratification - 06/06/18 1252      Activity Barriers & Cardiac Risk Stratification   Activity Barriers  Deconditioning;Muscular Weakness    Cardiac Risk Stratification  High       6 Minute Walk: 6 Minute Walk    Row Name 06/06/18 1348 09/13/18 0840       6 Minute Walk   Phase  Initial  Discharge    Distance  1520 feet  1700 feet    Distance % Change  -  11.8 %    Distance Feet Change  -  180 ft    Walk Time  6  minutes  6 minutes    # of Rest Breaks  0  0    MPH  2.88  3.22    METS  2.91  3.72    RPE  11  12    VO2 Peak  10.18  13    Symptoms  No  No    Resting HR  74 bpm  64 bpm    Resting BP  108/64  148/86    Resting Oxygen Saturation   97 %  -    Exercise Oxygen Saturation  during 6 min walk  98 %  97 %    Max Ex. HR  115 bpm  105 bpm    Max Ex. BP  174/76  190/82    2 Minute Post BP  126/62  -       Oxygen Initial Assessment:   Oxygen Re-Evaluation:   Oxygen Discharge (Final Oxygen Re-Evaluation):   Initial Exercise Prescription: Initial Exercise Prescription - 06/06/18 1300      Date of Initial Exercise RX and Referring Provider   Date  06/06/18    Referring Provider  Serafina Royals MD      Treadmill   MPH  2.3    Grade  0.5    Minutes  15    METs  2.93      Elliptical   Level  1    Speed  3    Minutes  15      T5 Nustep   Level  3     SPM  80    Minutes  15    METs  3      Prescription Details   Frequency (times per week)  2    Duration  Progress to 30 minutes of continuous aerobic without signs/symptoms of physical distress      Intensity   THRR 40-80% of Max Heartrate  104-135    Ratings of Perceived Exertion  11-13    Perceived Dyspnea  0-4      Progression   Progression  Continue to progress workloads to maintain intensity without signs/symptoms of physical distress.      Resistance Training   Training Prescription  Yes    Weight  4 lbs    Reps  10-15       Perform Capillary Blood Glucose checks as needed.  Exercise Prescription Changes: Exercise Prescription Changes    Row Name 06/06/18 1200 06/14/18 1000 06/28/18 1600 07/11/18 1600 07/27/18 1400     Response to Exercise   Blood Pressure (Admit)  108/64  136/60  200/100 164/84  138/78  122/72   Blood Pressure (Exercise)  174/76  140/62  170/78  132/70  160/70   Blood Pressure (Exit)  126/62  126/70  146/80  126/70  118/70   Heart Rate (Admit)  74 bpm  65 bpm  48 bpm  72 bpm  59 bpm   Heart Rate (Exercise)  115 bpm  95 bpm  110 bpm  103 bpm  118 bpm   Heart Rate (Exit)  85 bpm  64 bpm  85 bpm  78 bpm  52 bpm   Oxygen Saturation (Admit)  97 %  -  -  -  -   Oxygen Saturation (Exercise)  98 %  -  -  -  -   Rating of Perceived Exertion (Exercise)  _0 Symptoms  none  none  none  none  none  Comments  walk test results  first full day of exercise  second full day of exercise  -  -   Duration  -  Continue with 30 min of aerobic exercise without signs/symptoms of physical distress.  Continue with 30 min of aerobic exercise without signs/symptoms of physical distress.  Continue with 30 min of aerobic exercise without signs/symptoms of physical distress.  Continue with 30 min of aerobic exercise without signs/symptoms of physical distress.   Intensity  -  THRR unchanged  THRR unchanged  THRR unchanged  THRR unchanged     Progression    Progression  -  Continue to progress workloads to maintain intensity without signs/symptoms of physical distress.  Continue to progress workloads to maintain intensity without signs/symptoms of physical distress.  Continue to progress workloads to maintain intensity without signs/symptoms of physical distress.  Continue to progress workloads to maintain intensity without signs/symptoms of physical distress.   Average METs  -  2.9  2.96  3.66  3.41     Resistance Training   Training Prescription  -  Yes  Yes  Yes  Yes   Weight  -  4 lbs  4 lbs  4 lbs  4 lbs   Reps  -  10-15  10-15  10-15  10-15     Interval Training   Interval Training  -  No  No  No  No     Treadmill   MPH  -  -  2.8  2.8  2.8   Grade  -  -  _0 Minutes  -  -  _1 METs  -  -  3.92  3.92  3.92     Elliptical   Level  -  _2 Speed  -  _3 4.5   Minutes  -  _4 T5 Nustep   Level  -  _5 Minutes  -  _6 METs  -  2.9  2  3.4  2.9     Home Exercise Plan   Plans to continue exercise at  -  [employer] (comment) walking, [employer], [place] (comment) walking, [employer], [place] (comment) walking, [employer], [place] (comment) walking, [employer], firehouse gym   Frequency  -  Add 3 additional days to program exercise sessions.  Add 3 additional days to program exercise sessions.  Add 3 additional days to program exercise sessions.  Add 3 additional days to program exercise sessions.   Initial Home Exercises Provided  -  06/14/18  06/14/18  06/14/18  06/14/18   Row Name 08/23/18 1600 09/07/18 1300 09/20/18 1300 10/05/18 0900       Response to Exercise   Blood Pressure (Admit)  140/80  108/60  124/64  122/68    Blood Pressure (Exercise)  164/82  160/68  174/76  128/70    Blood Pressure (Exit)  132/84  130/84  120/74  122/80    Heart Rate (Admit)  62 bpm  84  bpm  64 bpm  70 bpm    Heart Rate (Exercise)  98 bpm  106 bpm  115 bpm  89 bpm    Heart  Rate (Exit)  68 bpm  74 bpm  70 bpm  67 bpm    Rating of Perceived Exertion (Exercise)  _0 Symptoms  none  none  none  none    Duration  Continue with 30 min of aerobic exercise without signs/symptoms of physical distress.  Continue with 30 min of aerobic exercise without signs/symptoms of physical distress.  Continue with 30 min of aerobic exercise without signs/symptoms of physical distress.  Continue with 30 min of aerobic exercise without signs/symptoms of physical distress.    Intensity  THRR unchanged  THRR unchanged  THRR unchanged  THRR unchanged      Progression   Progression  Continue to progress workloads to maintain intensity without signs/symptoms of physical distress.  Continue to progress workloads to maintain intensity without signs/symptoms of physical distress.  Continue to progress workloads to maintain intensity without signs/symptoms of physical distress.  Continue to progress workloads to maintain intensity without signs/symptoms of physical distress.    Average METs  3.21  3.66  2.77  4.21      Resistance Training   Training Prescription  Yes  Yes  Yes  Yes    Weight  4 lbs  6 lbs  10 lbs  10 lbs    Reps  10-15  10-15  10-15  10-15      Interval Training   Interval Training  No  No  No  No      Treadmill   MPH  2.8  2.8  2.5  2.5    Grade  _1 Minutes  _2 METs  3.92  3.92  3.95  4.64      Elliptical   Level  -  _3 Speed  -  _4 Minutes  -  _5 T5 Nustep   Level  _6 Minutes  _7 METs  2.5  3.4  4  3.6      Home Exercise Plan   Plans to continue exercise at  -  [employer] (comment) walking, [employer], [place] (comment) walking, [employer], [place] (comment) walking, [employer], firehouse gym    Frequency  -   Add 3 additional days to program exercise sessions.  Add 3 additional days to program exercise sessions.  Add 3 additional days to program exercise sessions.    Initial Home Exercises Provided  -  06/14/18  06/14/18  06/14/18       Exercise Comments: Exercise Comments    Row Name 06/14/18 [phone]           Exercise Comments  First full day of exercise!  Patient was oriented to gym and equipment including functions, settings, policies, and procedures.  Patient's individual exercise prescription and treatment plan were reviewed.  All starting workloads were established based on the results of the 6 minute walk test done at initial orientation visit.  The plan for exercise progression was also introduced and progression will be customized based on patient's performance and goals.          Exercise Goals and Review: Exercise Goals  [place] Name 06/06/18 1351             Exercise Goals   Increase Physical Activity  Yes       Intervention  Provide advice, education, support and counseling about physical activity/exercise needs.;Develop an individualized exercise prescription for aerobic and resistive training based on initial evaluation findings, risk stratification, comorbidities and participant's personal goals.       Expected Outcomes  Short Term: Attend rehab on a regular basis to increase amount of physical activity.;Long Term: Add in home exercise to make exercise part of routine and to increase amount of physical activity.;Long Term: Exercising regularly at least 3-5 days a week.       Increase Strength and Stamina  Yes       Intervention  Provide advice, education, support and counseling about physical activity/exercise needs.;Develop an individualized exercise prescription for aerobic and resistive training based on initial evaluation findings, risk stratification, comorbidities and participant's personal goals.       Expected Outcomes  Short Term: Perform resistance training exercises  routinely during rehab and add in resistance training at home;Long Term: Improve cardiorespiratory fitness, muscular endurance and strength as measured by increased METs and functional capacity (6MWT);Short Term: Increase workloads from initial exercise prescription for resistance, speed, and METs.       Able to understand and use rate of perceived exertion (RPE) scale  Yes       Intervention  Provide education and explanation on how to use RPE scale       Expected Outcomes  Short Term: Able to use RPE daily in rehab to express subjective intensity level;Long Term:  Able to use RPE to guide intensity level when exercising independently       Knowledge and understanding of Target Heart Rate Range (THRR)  Yes       Intervention  Provide education and explanation of THRR including how the numbers were predicted and where they are located for reference       Expected Outcomes  Short Term: Able to state/look up THRR;Long Term: Able to use THRR to govern intensity when exercising independently;Short Term: Able to use daily as guideline for intensity in rehab       Able to check pulse independently  Yes       Intervention  Provide education and demonstration on how to check pulse in carotid and radial arteries.;Review the importance of being able to check your own pulse for safety during independent exercise       Expected Outcomes  Short Term: Able to explain why pulse checking is important during independent exercise;Long Term: Able to check pulse independently and accurately       Understanding of Exercise Prescription  Yes       Intervention  Provide education, explanation, and written materials on patient's individual exercise prescription       Expected Outcomes  Short Term: Able to explain program exercise prescription;Long Term: Able to explain home exercise prescription to exercise independently          Exercise Goals Re-Evaluation : Exercise Goals Re-Evaluation    Row Name 06/14/18 0917  06/14/18 1024 06/28/18 1618 06/30/18 0805 07/11/18 1634     Exercise Goal Re-Evaluation   Exercise Goals Review  Increase Physical Activity;Increase Strength and Stamina;Able to understand and use rate of perceived exertion (RPE) scale;Knowledge and understanding of Target Heart Rate Range (THRR);Understanding of Exercise Prescription  Increase Physical Activity;Increase Strength and Stamina;Understanding of Exercise Prescription  Increase Physical Activity;Increase Strength  and Stamina;Understanding of Exercise Prescription  Increase Physical Activity;Increase Strength and Stamina;Understanding of Exercise Prescription  Increase Physical Activity;Increase Strength and Stamina;Understanding of Exercise Prescription   Comments  Reviewed RPE scale, THR and program prescription with pt today.  Pt voiced understanding and was given a copy of goals to take home.   Reviewed home exercise guidelines with Darrell [name].  He will continue to go to [employer] and walk.  He also has access to gym at fire house.   Darrell [name] is off to a good start in rehab.  He has completed his second full day of exercise. Today, he had some issues with his blood pressure being high coming in the door, but was able to exercise.  We will continue to monitor his progression.   Darrell [name] is doing well in rehab.  He has been walking around town as they wouldn't let him go to gym.  We talked about just [name] in to do his cardio and staying off the weight equipment.  He recovered from surgery quickly, but is leveling out on his strength now.  He has not had any afib symptoms and just a little pain at the insicion site.    Darrell [name] continues to do well in rehab. He comes when his work schedule allows, so he bounces around the different classes.  He is up to level 5 on the T5 NuStep.  We will continue to monitor his progression.    Expected Outcomes  Short: Use RPE daily to regulate intensity. Long: Follow program prescription in THR.  Short: Continue to  exericse more regularly.  Long: Continue to increase activity and stamina.   Short: Attend class regularly.  Long: Continue to exercies independently on off days.   Short: Start adding in more cardio at the gym.  Long: Continue to stay active on off days.   Short: Continue to increase workloads.  Long: Continue to add in cardio on off days.    [place] Name 07/21/18 2563 07/27/18 1434 08/09/18 0959 08/23/18 1610 08/30/18 0812     Exercise Goal Re-Evaluation   Exercise Goals Review  Increase Physical Activity;Increase Strength and Stamina;Able to check pulse independently  Increase Physical Activity;Increase Strength and Stamina;Able to check pulse independently  -  Increase Physical Activity;Increase Strength and Stamina;Able to check pulse independently  Increase Physical Activity;Increase Strength and Stamina;Able to check pulse independently   Comments  Darrell [name] continues do very well in rehab. He is doing cardio walking and using the Elliptical at [employer]. He is not using weights there. He is walking in the parade this weekend.  Darrell [name] is up to level on the elliptical now.  He continues to do well.  He is planning to walk while out on vacation to [place].  We will continue to monitor his progress.   Out since last review  Darrell [name] returned today after his trip to [place].  He enjoyed his trip and was able to return to his normal workloads.  We will continue to monitor his progress.   Darrell [name] is doing well in rehab. 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 He is starting to get his strength and stamina back.  He is still working his way back up with his strength since he cut it in half after surgery.     Expected Outcomes  Short: Continue to workout 2-3 extra days. Long: Darrell [name] would like to add weight machines back to his workout regimen. Start with 15  lbs and work up slowly.  Short: Walk while out on vacation.  Long: Continue to regain strength back.   -  Short: Attend classes  regularly again.  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   Short: Continue to get to class regularly.  Long: Continue to get in his exercise especially cardio.    [place] Name 09/07/18 1335 09/20/18 0841 10/05/18 0905         Exercise Goal Re-Evaluation   Exercise Goals Review  Increase Physical Activity;Increase Strength and Stamina;Able to check pulse independently  Increase Physical Activity;Increase Strength and Stamina;Able to understand and use rate of perceived exertion (RPE) scale;Knowledge and understanding of Target Heart Rate Range (THRR);Understanding of Exercise Prescription  Increase Physical Activity;Increase Strength and Stamina;Understanding of Exercise Prescription     Comments  Darrell [name] continues to do well in rehab.  He is up to 3.4 METs on the T5 NuStep.  We will continue to monitor his progress.   Darrell [name] plans to continue at [employer] for exercise.  He has clearancce from his Dr to do more strength work and wants to build back muscle he feels he has lost.  He is considering [name] to the [employer] and working with  Physiological scientist .  Darrell [name] will be graduating at his next visit.  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     Expected Outcomes  Short: Increase treadmill and talk about intervals.  Long: Continue to exercise regularly  Short - get info about [employer] and training Long - safely build muscle mass   Short: Graduate!!  Long: Continue to exercise independently!!        Discharge Exercise Prescription (Final Exercise Prescription Changes): Exercise Prescription Changes - 10/05/18 0900      Response to Exercise   Blood Pressure (Admit)  122/68    Blood Pressure (Exercise)  128/70    Blood Pressure (Exit)  122/80    Heart Rate (Admit)  70 bpm    Heart Rate (Exercise)  89 bpm    Heart Rate (Exit)  67 bpm    Rating of Perceived Exertion (Exercise)  12    Symptoms  none    Duration  Continue with 30 min of aerobic  exercise without signs/symptoms of physical distress.    Intensity  THRR unchanged      Progression   Progression  Continue to progress workloads to maintain intensity without signs/symptoms of physical distress.    Average METs  4.21      Resistance Training   Training Prescription  Yes    Weight  10 lbs    Reps  10-15      Interval Training   Interval Training  No      Treadmill   MPH  2.5    Grade  5    Minutes  15    METs  4.64      Elliptical   Level  4    Speed  4    Minutes  15      T5 Nustep   Level  5    Minutes  15    METs  3.6      Home Exercise Plan   Plans to continue exercise at  [employer] (comment)   walking, [employer], firehouse gym   Frequency  Add 3 additional days to program exercise sessions.    Initial Home Exercises Provided  06/14/18  Nutrition:  Target Goals: Understanding of nutrition guidelines, daily intake of sodium <1556m, cholesterol <2045m calories 30% from fat and 7% or less from saturated fats, daily to have 5 or more servings of fruits and vegetables.  Biometrics: Pre Biometrics - 06/06/18 1353      Pre Biometrics   Height  5' 8.1" (1.73 m)  (Pended)     Weight  222 lb 11.2 oz (101 kg)  (Pended)     Waist Circumference  42 inches  (Pended)     Hip Circumference  43 inches  (Pended)     Waist to Hip Ratio  0.98 %  (Pended)     BMI (Calculated)  33.75  (Pended)     Single Leg Stand  30 seconds  (Pended)       Post Biometrics - 09/13/18 0839       Post  Biometrics   Height  5' 8.1" (1.73 m)    Weight  226 lb (102.5 kg)    Waist Circumference  43 inches    Hip Circumference  43.5 inches    Waist to Hip Ratio  0.99 %    BMI (Calculated)  34.25    Single Leg Stand  30 seconds       Nutrition Therapy Plan and Nutrition Goals: Nutrition Therapy & Goals - 06/14/18 1007      Nutrition Therapy   Diet  DASH    Drug/Food Interactions  Statins/Certain Fruits    Protein (specify units)  9oz    Fiber  30  grams    Whole Grain Foods  3 servings   chooses whole grains half of the time   Saturated Fats  16 max. grams    Fruits and Vegetables  5 servings/day   8 ideal; eats vegetables regularly but not fruits   Sodium  1500 grams      Personal Nutrition Goals   Nutrition Goal  Increase fruit intake by at least one additional serving per week    Personal Goal #2  Increase fluid intake by at least one additional 8oz glass per day. Try adding extra fluids around exercise times first    Comments  His wife has been helping to change his diet by cooking without salt, buying low sodium products and decreasing portion sizes at meal times. She provides vegetables and whole grain choices regularly. They do not eat out often but he does choose fried foods when eating out occasionally. They eat a variety of protein sources and they do not fry foods at home. They ask for french fries without salt when eating at [facility]day; breakfast: cheerios with 2% milk, lunch: leftovers, sandwich, [employer], dinner: varies (soups, roast, chicken, shrimp, wheat pasta, brown rice, beef tips, salads, vegetables, baked potato). He drinks mostly water with the occasional sweet tea or diluted orange juice. He does not eat fruit regularly but does eat vegetables that his wife cooks      InHenningeducate and counsel regarding individualized specific dietary modifications aiming towards targeted core components such as weight, hypertension, lipid management, diabetes, heart failure and other comorbidities.    Expected Outcomes  Short Term Goal: Understand basic principles of dietary content, such as calories, fat, sodium, cholesterol and nutrients.;Short Term Goal: A plan has been developed with personal nutrition goals set during dietitian appointment.;Long Term Goal: Adherence to prescribed nutrition plan.       Nutrition Assessments: Nutrition Assessments - 06/06/18 1302  MEDFICTS Scores   Pre Score  42       Nutrition Goals Re-Evaluation: Nutrition Goals Re-Evaluation    [place] Name 06/14/18 1012 07/12/18 0829 08/23/18 0835         Goals   Nutrition Goal  Increase fluid intake by at least one additional 8oz glass per day. Try adding extra fluids around exercise times first  Increase fruit intake by at least one additional serving per week; Increase fluid intake by at least one additional 8oz glass per day, try adding extra fluids around exercise times first  Increase fruit intake by at least one additional serving per week; Increase fluid intake by at least one additional 8oz glass/day     Comment  He drinks mostly zero sugar beverages but does not feel that he drinks enough each day  He has been making an effort to drink more fluids each day. Today in class he had a glass of water with him while exercising. He feels that overall he is "doing better" with his diet and his wife is in charge of the food he eats and the cooking at home, who has been trying to provide healthier options and use less salt. He has lost a couple of pounds. He has not yet been intentional about eating more fruit  He reports to have been out of his regular routine during the holidays and ate some foods that he does not typically eat. He has continued to drink more fluids but has not eaten many more fruits. He and his wife plan to get back into the swing of preparing heart-healthy meals     Expected Outcome  He will try to drink at least one more glass of water daily, ideally around exercise sessions  He will work on eating more fruit each week and continue to increase fluid intake. He will work with his wife to have healthier options to eat at home  He will maintain habit of drinking more fluids. Continue to prepare heart healthy meals at home. Long term goal: eat more fruit       Personal Goal #2 Re-Evaluation   Personal Goal #2  Increase fruit intake by at least one additional serving per week   -  -        Nutrition Goals Discharge (Final Nutrition Goals Re-Evaluation): Nutrition Goals Re-Evaluation - 08/23/18 0835      Goals   Nutrition Goal  Increase fruit intake by at least one additional serving per week; Increase fluid intake by at least one additional 8oz glass/day    Comment  He reports to have been out of his regular routine during the holidays and ate some foods that he does not typically eat. He has continued to drink more fluids but has not eaten many more fruits. He and his wife plan to get back into the swing of preparing heart-healthy meals    Expected Outcome  He will maintain habit of drinking more fluids. Continue to prepare heart healthy meals at home. Long term goal: eat more fruit       Psychosocial: Target Goals: Acknowledge presence or absence of significant depression and/or stress, maximize coping skills, provide positive support system. Participant is able to verbalize types and ability to use techniques and skills needed for reducing stress and depression.   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 Darrell [name]'s wife is staying with her Mom every night. 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 "That is hard"  Sleeps to about 3 AM, sometimes takes time to get back to sleep until up for the day at 545       Quality of Life Scores:  Quality of Life - 09/20/18 1211      Quality of Life Scores   Health/Function Pre  6.1 %    Health/Function Post  14 %    Health/Function % Change  129.51 %    Socioeconomic Pre  26.69 %    Socioeconomic Post  24 %    Socioeconomic % Change   -10.08 %    Psych/Spiritual Pre  14.29 %    Psych/Spiritual Post  21.43 %    Psych/Spiritual % Change  49.97 %    Family Pre  21.6 %    Family  Post  26.4 %    Family % Change  22.22 %    GLOBAL Pre  14.66 %    GLOBAL Post  19.54 %    GLOBAL % Change  33.29 %      Scores of 19 and below usually indicate a poorer quality of life in these areas.  A difference of  2-3 points is a clinically meaningful difference.  A difference of 2-3 points in the total score of the Quality of Life Index has been associated with significant improvement in overall quality of life, self-image, physical symptoms, and general health in studies assessing change in quality of life.  PHQ-9: Recent Review Flowsheet Data    Depression screen [facility] 2/9 09/20/2018 06/30/2018 06/09/2018 06/06/2018 12/20/2017   Decreased Interest 1 0 _0 Down, Depressed, Hopeless _1 PHQ - 2 Score _2 Altered sleeping _3 Tired, decreased energy _4 Change in appetite 3 2 0 0 0   Feeling bad or failure about yourself  0 0 _5 Trouble concentrating 0 0 0 0 0   Moving slowly or fidgety/restless 0 0 0 0 0   Suicidal thoughts 0 0 0 0 0   PHQ-9 Score _6 Difficult doing work/chores Not difficult at all Somewhat difficult - Somewhat difficult -     Interpretation of Total Score  Total Score Depression Severity:  1-4 = Minimal depression, 5-9 = Mild depression, 10-14 = Moderate depression, 15-19 = Moderately severe depression, 20-27 = Severe depression   Psychosocial Evaluation and Intervention: Psychosocial Evaluation - 06/14/18 1025      Psychosocial Evaluation & Interventions   Interventions  Stress management education;Encouraged to exercise with the program and follow exercise prescription    Comments  Mr. 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  Counselor met with him today for initial psychosocial evaluation.   He is a 73 year old who has a family history of heart disease.  He has a strong support system with a spouse of 26 years; lots of family locally and active involvement with his local  church.  Darrell [name] reports only getting maybe ~5 hours/night sleep.  He has a history of OSA but is no longer using the CPAP once he lost weight.  He has a good appetite.  Darrell [name] denies a history of depression or anxiety or any current symptoms and states he is typically a positive person.  He has multiple stressors currently with his own health; his 27 year old mother-in-law is being cared for by Gerry's spouse; and Darrell [name] is the [place] which can be stressful at times as well.   He has goals to feel better overall; to lose some weight and increase his stamina and strength.  Staff will follow with Darrell [name].    Expected Outcomes  Short:  Darrell [name] will develop some positive coping strategies by attending the educational components of this program and learn to manage his stress better.  He will also meet with the dietician to address his weight loss goals.  Darrell [name] will benefit from exercising consistently for his health and mental health and as a coping strategy.  Long:  Darrell [name] will develop positive self-care habits from this program for his continued health and mental health.      Continue Psychosocial Services   Follow up required by staff       Psychosocial Re-Evaluation: Psychosocial Re-Evaluation    [place] Name 06/30/18 [phone] 07/21/18 0816 08/30/18 0813 09/20/18 0832       Psychosocial Re-Evaluation   Current issues with  Current Stress Concerns;Current Sleep Concerns  Current Stress Concerns;Current Sleep Concerns  Current Stress Concerns;Current Sleep Concerns  Current Stress Concerns;Current Sleep Concerns    Comments  Darrell [name] will be [name] to visit his granddaughter for Christmas and will miss three weeks starting in mid Rosalie.  He is looking forward to seeing them on Christmas.  He is not sleeping very well and got an extra 34mn at night.  Not sure what is triggering it.  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  He will need to work about 13 METs to return.   He has not talked to anyone about his sleep and is not  taking anything to help.  He is able to get to sleep its the sleeping long enough that is the problem. PHQ score has gone down by one point.  It was as low as 5 at the doctors office, but it is improving.   JSonia Sidehad a lot of work to get done before his trip. His sleep is not good. He goes to bed at 11:30pm and was waking up around 3:22am. and now sleeping until 5 am, so it has improved some. He does not usually go back to sleep when he wakes in the middle of the night but will try our Relaxation techniques to return to sleep. [place] of [place] sons house. He will stay active while he is there.  JSonia Sidehas been doing well mentally.  He continues have a lot of stress through work.  He enjoyed his visit with his grandbaby for three weeks.  He was able to sleep well last night.  He is still waking up around 3am but has been able to get back to sleep more frequently.  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   JSonia Sidehas slept well the past couple nights and he feels he is on the right track getting better sleep.  Work stress "is what it  is".  He is coping better by not answering the phone all the time.    Expected Outcomes  Short: Continue to cope with job stress.  Long: Continue to work on sleep.   Short: Continue to do his jobs within his current capabilites. He is [name] to alarms but not doing anything active. He tells everyone to stay back. Long: Continue to work on sleep.  Short: Continue to cope with job stresses positiviely. Long: Continue to work on sleeping better.   Short - continue to use coping strategies such as hobbies to reduce work stress Long - maintain manageable stress levels through coping strategies    Interventions  Stress management  education;Encouraged to attend Cardiac Rehabilitation for the exercise  -  Stress management education;Encouraged to attend Cardiac Rehabilitation for the exercise  Encouraged to attend Cardiac Rehabilitation for the exercise    Continue Psychosocial Services   Follow up required by staff  -  Follow up required by staff  Follow up required by staff    Comments  MOther in law in Skilled facility. Darrell [name]'s wife is staying with her Mom every night. 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 "That is hard"  Sleeps to about 3 AM, sometimes takes time to get back to sleep until up for the day at 545  -  -  -      Initial Review   Source of Stress Concerns  Family;Occupation  -  -  -       Psychosocial Discharge (Final Psychosocial Re-Evaluation): Psychosocial Re-Evaluation - 09/20/18 [phone]      Psychosocial Re-Evaluation   Current issues with  Current Stress Concerns;Current Sleep Concerns    Comments  Darrell [name] has slept well the past couple nights and he feels he is on the right track getting better sleep.  Work stress "is what it is".  He is coping better by not answering the phone all the time.    Expected Outcomes  Short - continue to use coping strategies such as hobbies to reduce work stress Long - maintain manageable stress levels through coping strategies    Interventions  Encouraged to attend Cardiac Rehabilitation for the exercise    Continue Psychosocial Services   Follow up required by staff       Vocational Rehabilitation: Provide vocational rehab assistance to qualifying candidates.   Vocational Rehab Evaluation & Intervention: Vocational Rehab - 06/06/18 1304      Initial Vocational Rehab Evaluation & Intervention   Assessment shows need for Vocational Rehabilitation  No       Education: Education Goals: Education classes will be provided on a variety of topics geared toward better understanding of heart health and risk factor modification. Participant will state  understanding/return demonstration of topics presented as noted by education test scores.  Learning Barriers/Preferences: Learning Barriers/Preferences - 06/06/18 1303      Learning Barriers/Preferences   Learning Barriers  None    Learning Preferences  None       Education Topics:  AED/CPR: - Group verbal and written instruction with the use of models to demonstrate the basic use of the AED with the basic ABC's of resuscitation.   General Nutrition Guidelines/Fats and Fiber: -Group instruction provided by verbal, written material, models and posters to present the general guidelines for heart healthy nutrition. Gives an explanation and review of dietary fats and fiber.   Cardiac Rehab from 09/29/2018 in [facility] Cardiac and Pulmonary Rehab  Date  09/20/18  Educator  SB  Instruction  Review Code  1- Verbalizes Understanding      Controlling Sodium/Reading Food Labels: -Group verbal and written material supporting the discussion of sodium use in heart healthy nutrition. Review and explanation with models, verbal and written materials for utilization of the food label.   Cardiac Rehab from 09/29/2018 in [facility] Cardiac and Pulmonary Rehab  Date  09/01/18  Educator  LB  Instruction Review Code  1- Verbalizes Understanding      Exercise Physiology & General Exercise Guidelines: - Group verbal and written instruction with models to review the exercise physiology of the cardiovascular system and associated critical values. Provides general exercise guidelines with specific guidelines to those with heart or lung disease.    Cardiac Rehab from 09/29/2018 in [facility] Cardiac and Pulmonary Rehab  Date  08/30/18  Educator  LB  Instruction Review Code  1- Verbalizes Understanding      Aerobic Exercise & Resistance Training: - Gives group verbal and written instruction on the various components of exercise. Focuses on aerobic and resistive training programs and the benefits of this training and how  to safely progress through these programs..   Cardiac Rehab from 09/29/2018 in [facility] Cardiac and Pulmonary Rehab  Date  09/08/18  Educator  [place]  Instruction Review Code  1- Verbalizes Understanding      Flexibility, Balance, Mind/Body Relaxation: Provides group verbal/written instruction on the benefits of flexibility and balance training, including mind/body exercise modes such as yoga, pilates and tai chi.  Demonstration and skill practice provided.   Cardiac Rehab from 09/29/2018 in [facility] Cardiac and Pulmonary Rehab  Date  09/13/18  Educator  AS  Instruction Review Code  1- Verbalizes Understanding      Stress and Anxiety: - Provides group verbal and written instruction about the health risks of elevated stress and causes of high stress.  Discuss the correlation between heart/lung disease and anxiety and treatment options. Review healthy ways to manage with stress and anxiety.   Cardiac Rehab from 01/04/2018 in [facility] Cardiac and Pulmonary Rehab  Date  12/14/17  Educator  [facility]  Instruction Review Code  1- Verbalizes Understanding      Depression: - Provides group verbal and written instruction on the correlation between heart/lung disease and depressed mood, treatment options, and the stigmas associated with seeking treatment.   Cardiac Rehab from 09/29/2018 in [facility] Cardiac and Pulmonary Rehab  Date  08/23/18  Educator  [facility]  Instruction Review Code  1- Verbalizes Understanding      Anatomy & Physiology of the Heart: - Group verbal and written instruction and models provide basic cardiac anatomy and physiology, with the coronary electrical and arterial systems. Review of Valvular disease and Heart Failure   Cardiac Rehab from 09/29/2018 in [facility] Cardiac and Pulmonary Rehab  Date  09/22/18  Educator  CE  Instruction Review Code  1- Verbalizes Understanding      Cardiac Procedures: - Group verbal and written instruction to review commonly prescribed medications for heart disease.  Reviews the medication, class of the drug, and [name] effects. Includes the steps to properly store meds and maintain the prescription regimen. (beta blockers and nitrates)   Cardiac Rehab from 09/29/2018 in [facility] Cardiac and Pulmonary Rehab  Date  07/26/18  Educator  SB  Instruction Review Code  1- Verbalizes Understanding      Cardiac Medications I: - Group verbal and written instruction to review commonly prescribed medications for heart disease. Reviews the medication, class of the drug, and [name] effects. Includes the steps  to properly store meds and maintain the prescription regimen.   Cardiac Rehab from 01/04/2018 in [facility] Cardiac and Pulmonary Rehab  Date  12/21/17  Educator  SB  Instruction Review Code  1- Verbalizes Understanding      Cardiac Medications II: -Group verbal and written instruction to review commonly prescribed medications for heart disease. Reviews the medication, class of the drug, and [name] effects. (all other drug classes)   Cardiac Rehab from 09/29/2018 in [facility] Cardiac and Pulmonary Rehab  Date  07/19/18  Educator  SB  Instruction Review Code  1- Verbalizes Understanding       Go Sex-Intimacy & Heart Disease, Get SMART - Goal Setting: - Group verbal and written instruction through game format to discuss heart disease and the return to sexual intimacy. Provides group verbal and written material to discuss and apply goal setting through the application of the S.M.A.R.T. Method.   Cardiac Rehab from 09/29/2018 in [facility] Cardiac and Pulmonary Rehab  Date  07/26/18  Educator  SB  Instruction Review Code  1- Verbalizes Understanding      Other Matters of the Heart: - Provides group verbal, written materials and models to describe Stable Angina and Peripheral Artery. Includes description of the disease process and treatment options available to the cardiac patient.   Cardiac Rehab from 09/29/2018 in [facility] Cardiac and Pulmonary Rehab  Date  09/22/18  Educator  CE   Instruction Review Code  1- Verbalizes Understanding      Exercise & Equipment Safety: - Individual verbal instruction and demonstration of equipment use and safety with use of the equipment.   Cardiac Rehab from 09/29/2018 in [facility] Cardiac and Pulmonary Rehab  Date  06/06/18  Educator  Sn  Instruction Review Code  1- Verbalizes Understanding      Infection Prevention: - Provides verbal and written material to individual with discussion of infection control including proper hand washing and proper equipment cleaning during exercise session.   Cardiac Rehab from 09/29/2018 in [facility] Cardiac and Pulmonary Rehab  Date  06/06/18  Educator  Sb  Instruction Review Code  1- Verbalizes Understanding      Falls Prevention: - Provides verbal and written material to individual with discussion of falls prevention and safety.   Cardiac Rehab from 09/29/2018 in [facility] Cardiac and Pulmonary Rehab  Date  06/06/18  Educator  Sb  Instruction Review Code  1- Verbalizes Understanding      Diabetes: - Individual verbal and written instruction to review signs/symptoms of diabetes, desired ranges of glucose level fasting, after meals and with exercise. Acknowledge that pre and post exercise glucose checks will be done for 3 sessions at entry of program.   Know Your Numbers and Risk Factors: -Group verbal and written instruction about important numbers in your health.  Discussion of what are risk factors and how they play a role in the disease process.  Review of Cholesterol, Blood Pressure, Diabetes, and BMI and the role they play in your overall health.   Cardiac Rehab from 09/29/2018 in [facility] Cardiac and Pulmonary Rehab  Date  07/19/18  Educator  SB  Instruction Review Code  1- Verbalizes Understanding      Sleep Hygiene: -Provides group verbal and written instruction about how sleep can affect your health.  Define sleep hygiene, discuss sleep cycles and impact of sleep habits. Review good sleep  hygiene tips.    Cardiac Rehab from 09/29/2018 in [facility] Cardiac and Pulmonary Rehab  Date  09/20/18  Educator  Lupita Leash  Instruction Review Code  1- Verbalizes Understanding      Other: -Provides group and verbal instruction on various topics (see comments)   Knowledge Questionnaire Score: Knowledge Questionnaire Score - 06/06/18 1303      Knowledge Questionnaire Score   Pre Score  24/26   reviewd correct responses with Darrell [name] todauy. He verbalized understanding of the responses and had no further questions.       Core Components/Risk Factors/Patient Goals at Admission: Personal Goals and Risk Factors at Admission - 06/06/18 1305      Core Components/Risk Factors/Patient Goals on Admission    Weight Management  Yes;Obesity;Weight Loss    Admit Weight  214 lb (97.1 kg)    Goal Weight: Short Term  212 lb (96.2 kg)    Goal Weight: Long Term  190 lb (86.2 kg)    Expected Outcomes  Short Term: Continue to assess and modify interventions until short term weight is achieved;Long Term: Adherence to nutrition and physical activity/exercise program aimed toward attainment of established weight goal;Weight Loss: Understanding of general recommendations for a balanced deficit meal plan, which promotes 1-2 lb weight loss per week and includes a negative energy balance of [phone] kcal/d    Hypertension  Yes    Intervention  Provide education on lifestyle modifcations including regular physical activity/exercise, weight management, moderate sodium restriction and increased consumption of fresh fruit, vegetables, and low fat dairy, alcohol moderation, and smoking cessation.;Monitor prescription use compliance.    Expected Outcomes  Short Term: Continued assessment and intervention until BP is < 140/34m HG in hypertensive participants. < 130/851mHG in hypertensive participants with diabetes, heart failure or chronic kidney disease.;Long Term: Maintenance of blood pressure at goal levels.    Lipids  Yes     Intervention  Provide education and support for participant on nutrition & aerobic/resistive exercise along with prescribed medications to achieve LDL <7069mHDL >37m1m  Expected Outcomes  Short Term: Participant states understanding of desired cholesterol values and is compliant with medications prescribed. Participant is following exercise prescription and nutrition guidelines.;Long Term: Cholesterol controlled with medications as prescribed, with individualized exercise RX and with personalized nutrition plan. Value goals: LDL < 70mg42mL > 40 mg.       Core Components/Risk Factors/Patient Goals Review:  Goals and Risk Factor Review    Row Name 06/30/18 0812 11945/19 0824 08/30/18 0815 09/20/18 0825       Core Components/Risk Factors/Patient Goals Review   Personal Goals Review  Weight Management/Obesity;Hypertension;Lipids  Weight Management/Obesity;Hypertension;Lipids  Weight Management/Obesity;Hypertension;Lipids  Weight Management/Obesity;Hypertension;Lipids    Review  Darrell [name]'s weight is climbing again.  He is eating but not always what is healthy.  He is doing well with his blood pressures other than the other day when he missed his medication in the morning.  He checks it at home twice a day.  He is doing well on his medications and may adjust his blood pressure meds some as it sometimes is slightly elevated.   We are [name] to add some more Interval training to try to give him a jump start his weight loss. Blood pressure has been high. He takes his meds regularly. We will send a note with BP's to Dr. KowalNehemiah Massedee if he wants to make meds changes.   JerrySonia Sidetill not losing as much weight as he would like.  He is cheating on his diet some and went out last night too.  The intervals are helping and he is doing them at  the station as well.  Blood pressures are starting to get better since starting his pravastatin and trying to get his cholesterol numbers even lower.  Overall, he is doing well.    Darrell [name] has not lost weight but is maintaining. He is working on Editor, commissioning to build muscle mass.  (with permission from his Dr).   He is eating smaller portions but not always the right things.        Expected Outcomes  Short: Get in more cardio to help with weight loss.  Long: Continue to monitor blood pressures.   Short: Start HIIT on cardio equipment.  Long: Continue to take meds and monitor blood pressure.   Short: Continue to work on weight loss.  Long: Continue to keep close eye on pressures.   Short - continue to exercise and use intervals Long - continue to work on  building strength       Core Components/Risk Factors/Patient Goals at Discharge (Final Review):  Goals and Risk Factor Review - 09/20/18 0825      Core Components/Risk Factors/Patient Goals Review   Personal Goals Review  Weight Management/Obesity;Hypertension;Lipids    Review  Darrell [name] has not lost weight but is maintaining. He is working on Editor, commissioning to build muscle mass.  (with permission from his Dr).   He is eating smaller portions but not always the right things.        Expected Outcomes  Short - continue to exercise and use intervals Long - continue to work on  building strength       ITP Comments: ITP Comments    Row Name 06/06/18 1240 06/22/18 0559 06/28/18 0818 07/20/18 0607 07/27/18 1433   ITP Comments  Medical review completed today. New diagnosis new program start.  ITP sent to Dr Sabra Heck for review, changes as needed and signature. Documentation of diagnosis can be found in [facility] 9/03 visit  30 day review. Continue with ITP unless direccted changes per Medical Director Chart Review. New to program  Darrell [name]'s blood pressure was elevated when he came in this morning (200/102).  He sat for a while and was able to come down to the 170s.  He did the T5 and treadmill today without any problems.   30 day review. Continue with ITP unless direccted changes per Medical Director Chart Review.  Darrell [name] will be out for  three weeks [name] to visit his grands in [place] for Christmas.    [place] Name 08/16/18 [phone] 09/14/18 0936 09/29/18 0755 10/12/18 0542     ITP Comments  30 Day Review. Continue with ITP unless directed changes per Medical Director review  30 Day Review. Continue with ITP unless directed changes per Medical Director review  Darrell [name] is [name] to [place] tomorrow for grandson's birthday party.  He still wants to come to finish up his last visits.   30 day review.Continue with ITP unless changes directed by Medical Director review.       Comments:

## 2018-10-18 ENCOUNTER — Encounter: Payer: Medicare Other | Attending: Internal Medicine

## 2018-10-18 DIAGNOSIS — Z951 Presence of aortocoronary bypass graft: Secondary | ICD-10-CM | POA: Insufficient documentation

## 2018-10-18 DIAGNOSIS — I629 Nontraumatic intracranial hemorrhage, unspecified: Secondary | ICD-10-CM | POA: Insufficient documentation

## 2018-10-18 DIAGNOSIS — L409 Psoriasis, unspecified: Secondary | ICD-10-CM | POA: Insufficient documentation

## 2018-10-18 DIAGNOSIS — E785 Hyperlipidemia, unspecified: Secondary | ICD-10-CM | POA: Insufficient documentation

## 2018-10-18 DIAGNOSIS — Z7902 Long term (current) use of antithrombotics/antiplatelets: Secondary | ICD-10-CM | POA: Insufficient documentation

## 2018-10-18 DIAGNOSIS — Z8546 Personal history of malignant neoplasm of prostate: Secondary | ICD-10-CM | POA: Insufficient documentation

## 2018-10-18 DIAGNOSIS — Z7901 Long term (current) use of anticoagulants: Secondary | ICD-10-CM | POA: Insufficient documentation

## 2018-10-18 DIAGNOSIS — Z7982 Long term (current) use of aspirin: Secondary | ICD-10-CM | POA: Insufficient documentation

## 2018-10-18 DIAGNOSIS — I2510 Atherosclerotic heart disease of native coronary artery without angina pectoris: Secondary | ICD-10-CM | POA: Insufficient documentation

## 2018-10-18 DIAGNOSIS — Z87891 Personal history of nicotine dependence: Secondary | ICD-10-CM | POA: Insufficient documentation

## 2018-10-18 DIAGNOSIS — I10 Essential (primary) hypertension: Secondary | ICD-10-CM | POA: Insufficient documentation

## 2018-10-18 DIAGNOSIS — Z79899 Other long term (current) drug therapy: Secondary | ICD-10-CM | POA: Insufficient documentation

## 2018-10-18 IMAGING — DX DG CHEST 1V PORT
1 series · 1 of 1 positions shown · non-contrast
Comparison: Chest radiograph September 02, 2014

CLINICAL DATA: Acute onset throat tightness and iron taste. History
of prostate cancer, hypertension.

EXAM:
PORTABLE CHEST 1 VIEW

[chest ap]
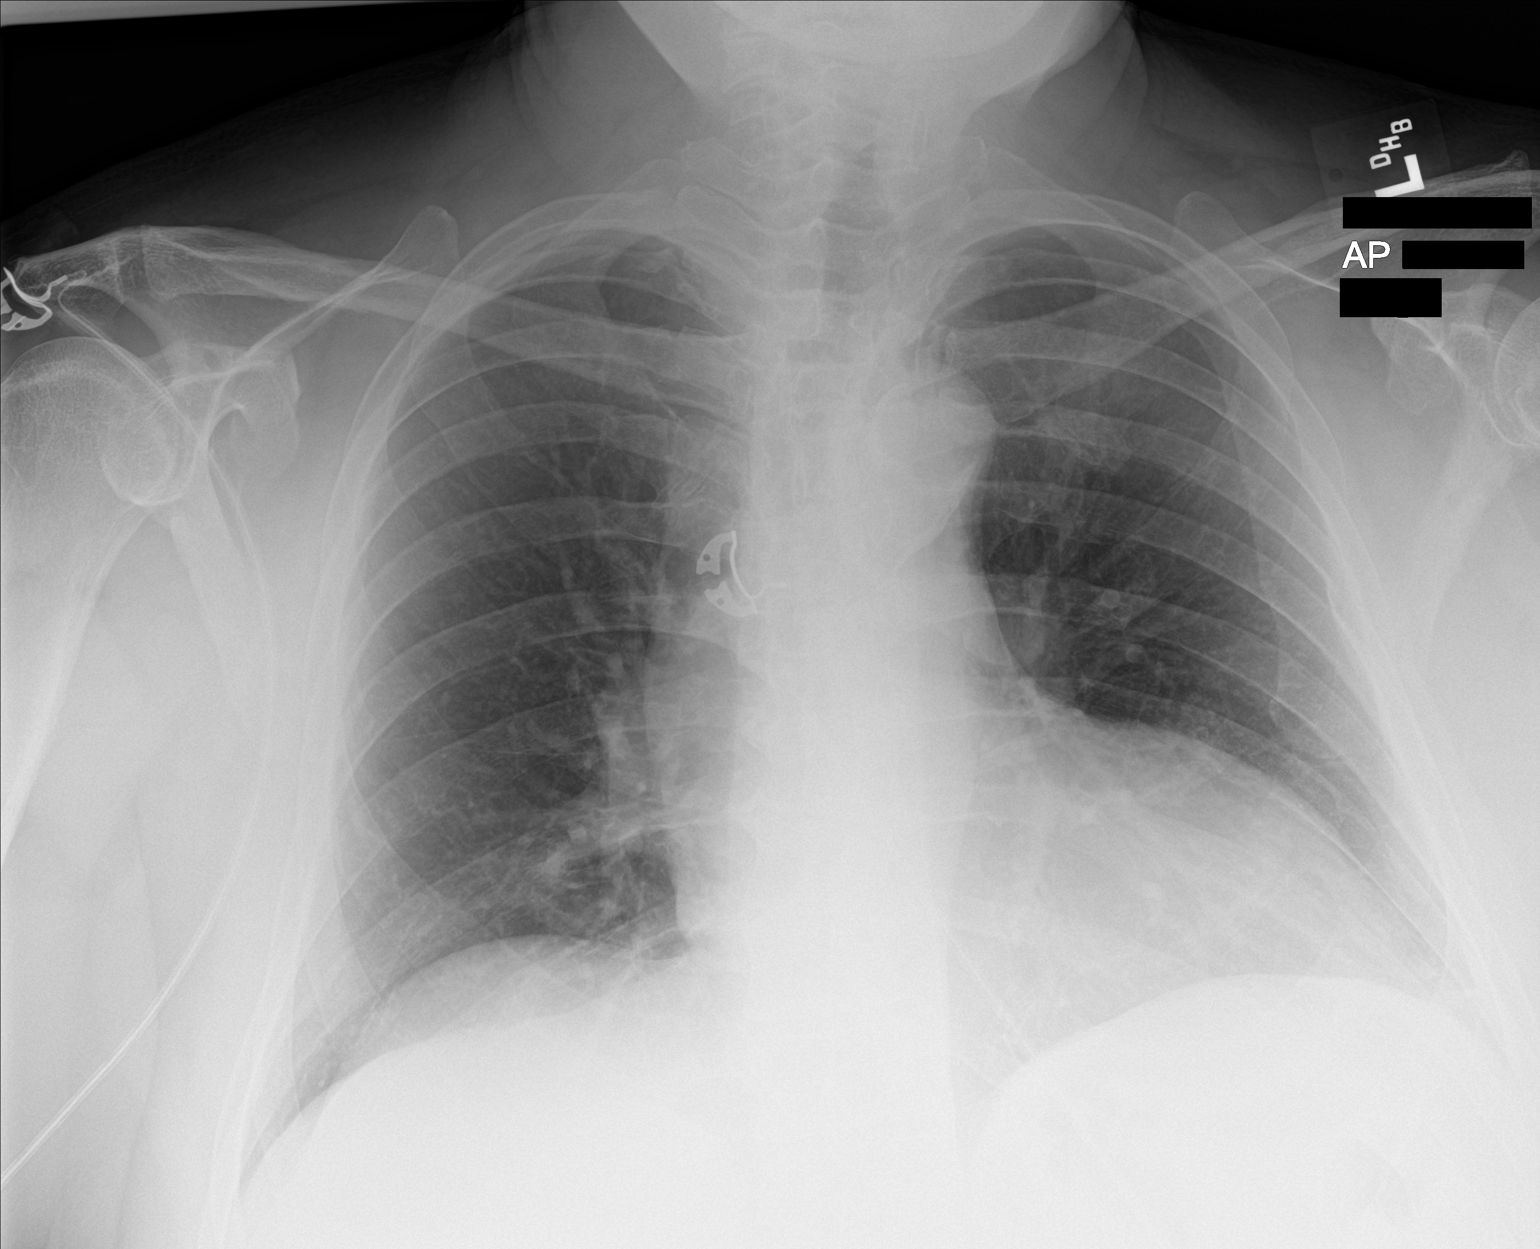

[1 of 1 positions shown; findings below may reference images not displayed]

FINDINGS: Mild cardiomegaly. Tortuous calcified aorta. No pleural effusion or
focal consolidation. No pneumothorax. Osseous structures are
nonsuspicious.
IMPRESSION: Mild cardiomegaly.  No acute pulmonary process.

Aortic Atherosclerosis ([id number]).

## 2018-10-19 ENCOUNTER — Encounter: Payer: Self-pay | Admitting: *Deleted

## 2018-10-19 ENCOUNTER — Telehealth: Payer: Self-pay | Admitting: *Deleted

## 2018-10-19 DIAGNOSIS — Z951 Presence of aortocoronary bypass graft: Secondary | ICD-10-CM

## 2018-10-19 NOTE — Telephone Encounter (Signed)
Called to check on Darrell [name].  He has been out since 2/13 and was on vacation for a week.  He has one more visit.

## 2018-10-27 ENCOUNTER — Encounter: Payer: Medicare Other | Admitting: *Deleted

## 2018-10-27 ENCOUNTER — Other Ambulatory Visit: Payer: Self-pay

## 2018-10-27 DIAGNOSIS — Z87891 Personal history of nicotine dependence: Secondary | ICD-10-CM | POA: Diagnosis not present

## 2018-10-27 DIAGNOSIS — Z7901 Long term (current) use of anticoagulants: Secondary | ICD-10-CM | POA: Diagnosis not present

## 2018-10-27 DIAGNOSIS — I10 Essential (primary) hypertension: Secondary | ICD-10-CM | POA: Diagnosis not present

## 2018-10-27 DIAGNOSIS — E785 Hyperlipidemia, unspecified: Secondary | ICD-10-CM | POA: Diagnosis not present

## 2018-10-27 DIAGNOSIS — I2510 Atherosclerotic heart disease of native coronary artery without angina pectoris: Secondary | ICD-10-CM | POA: Diagnosis not present

## 2018-10-27 DIAGNOSIS — L409 Psoriasis, unspecified: Secondary | ICD-10-CM | POA: Diagnosis not present

## 2018-10-27 DIAGNOSIS — Z8546 Personal history of malignant neoplasm of prostate: Secondary | ICD-10-CM | POA: Diagnosis not present

## 2018-10-27 DIAGNOSIS — Z7982 Long term (current) use of aspirin: Secondary | ICD-10-CM | POA: Diagnosis not present

## 2018-10-27 DIAGNOSIS — I629 Nontraumatic intracranial hemorrhage, unspecified: Secondary | ICD-10-CM | POA: Diagnosis not present

## 2018-10-27 DIAGNOSIS — Z951 Presence of aortocoronary bypass graft: Secondary | ICD-10-CM

## 2018-10-27 DIAGNOSIS — Z79899 Other long term (current) drug therapy: Secondary | ICD-10-CM | POA: Diagnosis not present

## 2018-10-27 DIAGNOSIS — Z7902 Long term (current) use of antithrombotics/antiplatelets: Secondary | ICD-10-CM | POA: Diagnosis not present

## 2018-10-27 NOTE — Progress Notes (Signed)
Daily Session Note  Patient Details  Name: Darrell [name] MRN: [id number] Date of Birth: 1946/03/12 Referring Provider:     Cardiac Rehab from 06/06/2018 in [facility] Cardiac [facility]hab  Referring Provider  Serafina Royals MD      Encounter Date: 10/27/2018  Check In: Session Check In - 10/27/18 1025      Check-In   Supervising physician immediately available to respond to emergencies  See telemetry face sheet for immediately available ER MD    Location  [facility]-Cardiac & Pulmonary Rehab    Staff Present  Jasper Loser BS, Exercise Physiologist;Susanne Bice, RN, BSN, CCRP;Jessica Dunbar, MA, RCEP, CCRP, Exercise Physiologist    Medication changes reported      No    Fall or balance concerns reported     No    Warm-up and Cool-down  Performed as group-led instruction    Resistance Training Performed  Yes    VAD Patient?  No    PAD/SET Patient?  No      Pain Assessment   Currently in Pain?  No/denies          Social History   Tobacco Use  Smoking Status Former Smoker  . Types: Cigarettes  . Last attempt to quit: 1989  . Years since quitting: 31.2  Smokeless Tobacco Never Used  Tobacco Comment   quit 30 years ago     Goals Met:  Independence with exercise equipment Exercise tolerated well Personal goals reviewed No report of cardiac concerns or symptoms Strength training completed today  Goals Unmet:  Not Applicable  Comments:  Darrell [name] graduated today from  rehab with 36 sessions completed.  Details of the patient's exercise prescription and what He needs to do in order to continue the prescription and progress were discussed with patient.  Patient was given a copy of prescription and goals.  Patient verbalized understanding.  Darrell [name] plans to continue to exercise by going to [employer] and gym at station.    Dr. Emily Filbert is Medical Director for [place] [facility].

## 2018-10-27 NOTE — Progress Notes (Signed)
Discharge Progress Report  Patient Details  Name: Darrell [name] MRN: [id number] Date of Birth: [birth year] Referring Provider:     Cardiac Rehab from 06/06/2018 in [facility]  Referring Provider  Serafina Royals MD       Number of Visits: 36  Reason for Discharge:  Patient reached a stable level of exercise. Patient independent in their exercise. Patient has met program and personal goals.  Smoking History:  Social History   Tobacco Use  Smoking Status Former Smoker  . Types: Cigarettes  . Last attempt to quit: 1989  . Years since quitting: 31.2  Smokeless Tobacco Never Used  Tobacco Comment   quit 30 years ago     Diagnosis:  S/P CABG x 2  ADL UCSD:   Initial Exercise Prescription: Initial Exercise Prescription - 06/06/18 1300      Date of Initial Exercise RX and Referring Provider   Date  06/06/18    Referring Provider  Serafina Royals MD      Treadmill   MPH  2.3    Grade  0.5    Minutes  15    METs  2.93      Elliptical   Level  1    Speed  3    Minutes  15      T5 Nustep   Level  3    SPM  80    Minutes  15    METs  3      Prescription Details   Frequency (times per week)  2    Duration  Progress to 30 minutes of continuous aerobic without signs/symptoms of physical distress      Intensity   THRR 40-80% of Max Heartrate  104-135    Ratings of Perceived Exertion  11-13    Perceived Dyspnea  0-4      Progression   Progression  Continue to progress workloads to maintain intensity without signs/symptoms of physical distress.      Resistance Training   Training Prescription  Yes    Weight  4 lbs    Reps  10-15       Discharge Exercise Prescription (Final Exercise Prescription Changes): Exercise Prescription Changes - 10/05/18 0900      Response to Exercise   Blood Pressure (Admit)  122/68    Blood Pressure (Exercise)  128/70    Blood Pressure (Exit)  122/80    Heart Rate (Admit)  70 bpm    Heart Rate  (Exercise)  89 bpm    Heart Rate (Exit)  67 bpm    Rating of Perceived Exertion (Exercise)  12    Symptoms  none    Duration  Continue with 30 min of aerobic exercise without signs/symptoms of physical distress.    Intensity  THRR unchanged      Progression   Progression  Continue to progress workloads to maintain intensity without signs/symptoms of physical distress.    Average METs  4.21      Resistance Training   Training Prescription  Yes    Weight  10 lbs    Reps  10-15      Interval Training   Interval Training  No      Treadmill   MPH  2.5    Grade  5    Minutes  15    METs  4.64      Elliptical   Level  4    Speed  4    Minutes  15      T5 Nustep   Level  5    Minutes  15    METs  3.6      Home Exercise Plan   Plans to continue exercise at  [employer] (comment)   walking, [employer], firehouse gym   Frequency  Add 3 additional days to program exercise sessions.    Initial Home Exercises Provided  06/14/18       Functional Capacity: 6 Minute Walk    Row Name 06/06/18 1348 09/13/18 0840       6 Minute Walk   Phase  Initial  Discharge    Distance  1520 feet  1700 feet    Distance % Change  -  11.8 %    Distance Feet Change  -  180 ft    Walk Time  6 minutes  6 minutes    # of Rest Breaks  0  0    MPH  2.88  3.22    METS  2.91  3.72    RPE  11  12    VO2 Peak  10.18  13    Symptoms  No  No    Resting HR  74 bpm  64 bpm    Resting BP  108/64  148/86    Resting Oxygen Saturation   97 %  -    Exercise Oxygen Saturation  during 6 min walk  98 %  97 %    Max Ex. HR  115 bpm  105 bpm    Max Ex. BP  174/76  190/82    2 Minute Post BP  126/62  -       Psychological, QOL, Others - Outcomes: PHQ 2/9: Depression screen [facility] 2/9 09/20/2018 06/30/2018 06/09/2018 06/06/2018 12/20/2017  Decreased Interest 1 0 1 1 2   Down, Depressed, Hopeless 1 1 1 1 3   PHQ - 2 Score 2 1 2 2 5   Altered sleeping 3 3 1 3 3   Tired, decreased energy 1 1 1 1 3    Change in appetite 3 2 0 0 0  Feeling bad or failure about yourself  0 0 1 2 1   Trouble concentrating 0 0 0 0 0  Moving slowly or fidgety/restless 0 0 0 0 0  Suicidal thoughts 0 0 0 0 0  PHQ-9 Score 9 7 5 8 12   Difficult doing work/chores Not difficult at all Somewhat difficult - Somewhat difficult -    Quality of Life: Quality of Life - 09/20/18 1211      Quality of Life Scores   Health/Function Pre  6.1 %    Health/Function Post  14 %    Health/Function % Change  129.51 %    Socioeconomic Pre  26.69 %    Socioeconomic Post  24 %    Socioeconomic % Change   -10.08 %    Psych/Spiritual Pre  14.29 %    Psych/Spiritual Post  21.43 %    Psych/Spiritual % Change  49.97 %    Family Pre  21.6 %    Family Post  26.4 %    Family % Change  22.22 %    GLOBAL Pre  14.66 %    GLOBAL Post  19.54 %    GLOBAL % Change  33.29 %       Personal Goals: Goals established at orientation with interventions provided to work toward goal. Personal Goals and Risk Factors at Admission - 06/06/18 1305  Core Components/Risk Factors/Patient Goals on Admission    Weight Management  Yes;Obesity;Weight Loss    Admit Weight  214 lb (97.1 kg)    Goal Weight: Short Term  212 lb (96.2 kg)    Goal Weight: Long Term  190 lb (86.2 kg)    Expected Outcomes  Short Term: Continue to assess and modify interventions until short term weight is achieved;Long Term: Adherence to nutrition and physical activity/exercise program aimed toward attainment of established weight goal;Weight Loss: Understanding of general recommendations for a balanced deficit meal plan, which promotes 1-2 lb weight loss per week and includes a negative energy balance of [phone] kcal/d    Hypertension  Yes    Intervention  Provide education on lifestyle modifcations including regular physical activity/exercise, weight management, moderate sodium restriction and increased consumption of fresh fruit, vegetables, and low fat dairy, alcohol  moderation, and smoking cessation.;Monitor prescription use compliance.    Expected Outcomes  Short Term: Continued assessment and intervention until BP is < 140/61m HG in hypertensive participants. < 130/874mHG in hypertensive participants with diabetes, heart failure or chronic kidney disease.;Long Term: Maintenance of blood pressure at goal levels.    Lipids  Yes    Intervention  Provide education and support for participant on nutrition & aerobic/resistive exercise along with prescribed medications to achieve LDL <708mHDL >70m70m  Expected Outcomes  Short Term: Participant states understanding of desired cholesterol values and is compliant with medications prescribed. Participant is following exercise prescription and nutrition guidelines.;Long Term: Cholesterol controlled with medications as prescribed, with individualized exercise RX and with personalized nutrition plan. Value goals: LDL < 70mg22mL > 40 mg.        Personal Goals Discharge: Goals and Risk Factor Review    Row Name 06/30/18 0812 25365/[id number] 0824 08/30/18 0815 09/20/18 0825       Core Components/Risk Factors/Patient Goals Review   Personal Goals Review  Weight Management/Obesity;Hypertension;Lipids  Weight Management/Obesity;Hypertension;Lipids  Weight Management/Obesity;Hypertension;Lipids  Weight Management/Obesity;Hypertension;Lipids    Review  Jerry's weight is climbing again.  He is eating but not always what is healthy.  He is doing well with his blood pressures other than the other day when he missed his medication in the morning.  He checks it at home twice a day.  He is doing well on his medications and may adjust his blood pressure meds some as it sometimes is slightly elevated.   We are going to add some more Interval training to try to give him a jump start his weight loss. Blood pressure has been high. He takes his meds regularly. We will send a note with BP's to Dr. KowalNehemiah Massedee if he wants to make meds changes.    JerrySonia Sidetill not losing as much weight as he would like.  He is cheating on his diet some and went out last night too.  The intervals are helping and he is doing them at the station as well.  Blood pressures are starting to get better since starting his pravastatin and trying to get his cholesterol numbers even lower.  Overall, he is doing well.   JerrySonia Sidenot lost weight but is maintaining. He is working on strenEditor, commissioninguild muscle mass.  (with permission from his Dr).   He is eating smaller portions but not always the right things.        Expected Outcomes  Short: Get in more cardio to help with weight loss.  Long: Continue to monitor  blood pressures.   Short: Start HIIT on cardio equipment.  Long: Continue to take meds and monitor blood pressure.   Short: Continue to work on weight loss.  Long: Continue to keep close eye on pressures.   Short - continue to exercise and use intervals Long - continue to work on  building strength       Exercise Goals and Review: Exercise Goals    Row Name 06/06/18 1351             Exercise Goals   Increase Physical Activity  Yes       Intervention  Provide advice, education, support and counseling about physical activity/exercise needs.;Develop an individualized exercise prescription for aerobic and resistive training based on initial evaluation findings, risk stratification, comorbidities and participant's personal goals.       Expected Outcomes  Short Term: Attend rehab on a regular basis to increase amount of physical activity.;Long Term: Add in home exercise to make exercise part of routine and to increase amount of physical activity.;Long Term: Exercising regularly at least 3-5 days a week.       Increase Strength and Stamina  Yes       Intervention  Provide advice, education, support and counseling about physical activity/exercise needs.;Develop an individualized exercise prescription for aerobic and resistive training based on initial  evaluation findings, risk stratification, comorbidities and participant's personal goals.       Expected Outcomes  Short Term: Perform resistance training exercises routinely during rehab and add in resistance training at home;Long Term: Improve cardiorespiratory fitness, muscular endurance and strength as measured by increased METs and functional capacity (6MWT);Short Term: Increase workloads from initial exercise prescription for resistance, speed, and METs.       Able to understand and use rate of perceived exertion (RPE) scale  Yes       Intervention  Provide education and explanation on how to use RPE scale       Expected Outcomes  Short Term: Able to use RPE daily in rehab to express subjective intensity level;Long Term:  Able to use RPE to guide intensity level when exercising independently       Knowledge and understanding of Target Heart Rate Range (THRR)  Yes       Intervention  Provide education and explanation of THRR including how the numbers were predicted and where they are located for reference       Expected Outcomes  Short Term: Able to state/look up THRR;Long Term: Able to use THRR to govern intensity when exercising independently;Short Term: Able to use daily as guideline for intensity in rehab       Able to check pulse independently  Yes       Intervention  Provide education and demonstration on how to check pulse in carotid and radial arteries.;Review the importance of being able to check your own pulse for safety during independent exercise       Expected Outcomes  Short Term: Able to explain why pulse checking is important during independent exercise;Long Term: Able to check pulse independently and accurately       Understanding of Exercise Prescription  Yes       Intervention  Provide education, explanation, and written materials on patient's individual exercise prescription       Expected Outcomes  Short Term: Able to explain program exercise prescription;Long Term: Able to  explain home exercise prescription to exercise independently          Exercise Goals Re-Evaluation: Exercise Goals  Re-Evaluation    Row Name 06/14/18 4034 06/14/18 1024 06/28/18 1618 06/30/18 0805 07/11/18 1634     Exercise Goal Re-Evaluation   Exercise Goals Review  Increase Physical Activity;Increase Strength and Stamina;Able to understand and use rate of perceived exertion (RPE) scale;Knowledge and understanding of Target Heart Rate Range (THRR);Understanding of Exercise Prescription  Increase Physical Activity;Increase Strength and Stamina;Understanding of Exercise Prescription  Increase Physical Activity;Increase Strength and Stamina;Understanding of Exercise Prescription  Increase Physical Activity;Increase Strength and Stamina;Understanding of Exercise Prescription  Increase Physical Activity;Increase Strength and Stamina;Understanding of Exercise Prescription   Comments  Reviewed RPE scale, THR and program prescription with pt today.  Pt voiced understanding and was given a copy of goals to take home.   Reviewed home exercise guidelines with [name].  He will continue to go to [employer] and walk.  He also has access to gym at fire house.   [name] is off to a good start in rehab.  He has completed his second full day of exercise. Today, he had some issues with his blood pressure being high coming in the door, but was able to exercise.  We will continue to monitor his progression.   [name] is doing well in rehab.  He has been walking around town as they wouldn't let him go to gym.  We talked about just going in to do his cardio and staying off the weight equipment.  He recovered from surgery quickly, but is leveling out on his strength now.  He has not had any afib symptoms and just a little pain at the insicion site.    [name] continues to do well in rehab. He comes when his work schedule allows, so he bounces around the different classes.  He is up to level 5 on the T5 NuStep.  We will continue to  monitor his progression.    Expected Outcomes  Short: Use RPE daily to regulate intensity. Long: Follow program prescription in THR.  Short: Continue to exericse more regularly.  Long: Continue to increase activity and stamina.   Short: Attend class regularly.  Long: Continue to exercies independently on off days.   Short: Start adding in more cardio at the gym.  Long: Continue to stay active on off days.   Short: Continue to increase workloads.  Long: Continue to add in cardio on off days.    [place] Name 07/21/18 7425 07/27/18 1434 08/09/18 0959 08/23/18 1610 08/30/18 0812     Exercise Goal Re-Evaluation   Exercise Goals Review  Increase Physical Activity;Increase Strength and Stamina;Able to check pulse independently  Increase Physical Activity;Increase Strength and Stamina;Able to check pulse independently  -  Increase Physical Activity;Increase Strength and Stamina;Able to check pulse independently  Increase Physical Activity;Increase Strength and Stamina;Able to check pulse independently   Comments  [name] continues do very well in rehab. He is doing cardio walking and using the Elliptical at [employer]. He is not using weights there. He is walking in the parade this weekend.  [name] is up to level on the elliptical now.  He continues to do well.  He is planning to walk while out on vacation to [place].  We will continue to monitor his progress.   Out since last review  [name] returned today after his trip to [place].  He enjoyed his trip and was able to return to his normal workloads.  We will continue to monitor his progress.   [name] is doing well in rehab. He is going to the gym  at the station to do his cardio and lift some weights. He is starting to get his strength and stamina back.  He is still working his way back up with his strength since he cut it in half after surgery.     Expected Outcomes  Short: Continue to workout 2-3 extra days. Long: [name] would like to add weight machines back to his  workout regimen. Start with 15 lbs and work up slowly.  Short: Walk while out on vacation.  Long: Continue to regain strength back.   -  Short: Attend classes regularly again.  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   Short: Continue to get to class regularly.  Long: Continue to get in his exercise especially cardio.    [place] Name 09/07/18 1335 09/20/18 0841 10/05/18 0905 10/19/18 0947       Exercise Goal Re-Evaluation   Exercise Goals Review  Increase Physical Activity;Increase Strength and Stamina;Able to check pulse independently  Increase Physical Activity;Increase Strength and Stamina;Able to understand and use rate of perceived exertion (RPE) scale;Knowledge and understanding of Target Heart Rate Range (THRR);Understanding of Exercise Prescription  Increase Physical Activity;Increase Strength and Stamina;Understanding of Exercise Prescription  -    Comments  [name] continues to do well in rehab.  He is up to 3.4 METs on the T5 NuStep.  We will continue to monitor his progress.   [name] plans to continue at [employer] for exercise.  He has clearancce from his Dr to do more strength work and wants to build back muscle he feels he has lost.  He is considering going to the [employer] and working with  Physiological scientist .  [name] will be graduating at his next visit.  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  Out since last review    Expected Outcomes  Short: Increase treadmill and talk about intervals.  Long: Continue to exercise regularly  Short - get info about [employer] and training Long - safely build muscle mass   Short: Graduate!!  Long: Continue to exercise independently!!  -       Nutrition & Weight - Outcomes: Pre Biometrics - 06/06/18 1353      Pre Biometrics   Height  5' 8.1" (1.73 m)  (Pended)     Weight  222 lb 11.2 oz (101 kg)  (Pended)     Waist Circumference  42 inches  (Pended)     Hip Circumference  43 inches  (Pended)     Waist  to Hip Ratio  0.98 %  (Pended)     BMI (Calculated)  33.75  (Pended)     Single Leg Stand  30 seconds  (Pended)       Post Biometrics - 09/13/18 0839       Post  Biometrics   Height  5' 8.1" (1.73 m)    Weight  226 lb (102.5 kg)    Waist Circumference  43 inches    Hip Circumference  43.5 inches    Waist to Hip Ratio  0.99 %    BMI (Calculated)  34.25    Single Leg Stand  30 seconds       Nutrition: Nutrition Therapy & Goals - 06/14/18 1007      Nutrition Therapy   Diet  DASH    Drug/Food Interactions  Statins/Certain Fruits    Protein (specify units)  9oz    Fiber  30 grams    Whole  Grain Foods  3 servings   chooses whole grains half of the time   Saturated Fats  16 max. grams    Fruits and Vegetables  5 servings/day   8 ideal; eats vegetables regularly but not fruits   Sodium  1500 grams      Personal Nutrition Goals   Nutrition Goal  Increase fruit intake by at least one additional serving per week    Personal Goal #2  Increase fluid intake by at least one additional 8oz glass per day. Try adding extra fluids around exercise times first    Comments  His wife has been helping to change his diet by cooking without salt, buying low sodium products and decreasing portion sizes at meal times. She provides vegetables and whole grain choices regularly. They do not eat out often but he does choose fried foods when eating out occasionally. They eat a variety of protein sources and they do not fry foods at home. They ask for french fries without salt when eating at [facility]. Eats 3 meals/day; breakfast: cheerios with 2% milk, lunch: leftovers, sandwich, [employer], dinner: varies (soups, roast, chicken, shrimp, wheat pasta, brown rice, beef tips, salads, vegetables, baked potato). He drinks mostly water with the occasional sweet tea or diluted orange juice. He does not eat fruit regularly but does eat vegetables that his wife cooks      [place], educate  and counsel regarding individualized specific dietary modifications aiming towards targeted core components such as weight, hypertension, lipid management, diabetes, heart failure and other comorbidities.    Expected Outcomes  Short Term Goal: Understand basic principles of dietary content, such as calories, fat, sodium, cholesterol and nutrients.;Short Term Goal: A plan has been developed with personal nutrition goals set during dietitian appointment.;Long Term Goal: Adherence to prescribed nutrition plan.       Nutrition Discharge: Nutrition Assessments - 06/06/18 1302      MEDFICTS Scores   Pre Score  42       Education Questionnaire Score: Knowledge Questionnaire Score - 06/06/18 1303      Knowledge Questionnaire Score   Pre Score  24/26   reviewd correct responses with [name] todauy. He verbalized understanding of the responses and had no further questions.       Goals reviewed with patient; copy given to patient.

## 2018-10-27 NOTE — Progress Notes (Signed)
Cardiac Individual Treatment Plan  Patient Details  Name: Darrell [name] MRN: [id number] Date of Birth: [birth year] Referring Provider:     Cardiac Rehab from 06/06/2018 in [facility] Cardiac and Pulmonary Rehab  Referring Provider  Serafina Royals MD      Initial Encounter Date:    Cardiac Rehab from 06/06/2018 in [facility] Cardiac and Pulmonary Rehab  Date  06/06/18      Visit Diagnosis: S/P CABG x 2  Patient's Home Medications on Admission:  Current Outpatient Medications:  .  allopurinol (ZYLOPRIM) 300 MG tablet, Take 0.5 tablets (150 mg total) by mouth daily., Disp: 90 tablet, Rfl: 4 .  apixaban (ELIQUIS) 5 MG TABS tablet, Take 5 mg by mouth 2 (two) times daily. , Disp: , Rfl:  .  aspirin EC 81 MG tablet, Take by mouth., Disp: , Rfl:  .  atorvastatin (LIPITOR) 80 MG tablet, Take 80 mg by mouth daily., Disp: , Rfl:  .  carvedilol (COREG) 6.25 MG tablet, Take by mouth., Disp: , Rfl:  .  hydrochlorothiazide (HYDRODIURIL) 25 MG tablet, Take 1 tablet (25 mg total) by mouth daily., Disp: 90 tablet, Rfl: 4 .  pravastatin (PRAVACHOL) 40 MG tablet, Take 1 tablet by mouth 1 day or 1 dose., Disp: , Rfl:  .  telmisartan (MICARDIS) 80 MG tablet, Take by mouth., Disp: , Rfl:   Past Medical History: Past Medical History:  Diagnosis Date  . CAD (coronary artery disease)   . Cancer [facility])    prostate  . Carotid artery stenosis   . Hyperlipidemia   . Hypertension   . Psoriasis     Tobacco Use: Social History   Tobacco Use  Smoking Status Former Smoker  . Types: Cigarettes  . Last attempt to quit: 1989  . Years since quitting: 31.2  Smokeless Tobacco Never Used  Tobacco Comment   quit 30 years ago     Labs: Recent Review Flowsheet Data    Labs for ITP Cardiac and Pulmonary Rehab Latest Ref Rng & Units 12/17/2016 05/20/2017 06/09/2017 12/20/2017 09/07/2018   Cholestrol <200 mg/dL 104 - 106 134 114   LDLCALC 0 - 99 mg/dL 39 - 42 44 -   HDL >39 mg/dL 41 - 41 39(L) -   Trlycerides <150  mg/dL 122 - 114 257(H) 129   Hemoglobin A1c 4.8 - 5.6 % 6.0 5.6 - - -       Exercise Target Goals: Exercise Program Goal: Individual exercise prescription set using results from initial 6 min walk test and THRR while considering  patient's activity barriers and safety.   Exercise Prescription Goal: Initial exercise prescription builds to 30-45 minutes a day of aerobic activity, 2-3 days per week.  Home exercise guidelines will be given to patient during program as part of exercise prescription that the participant will acknowledge.  Activity Barriers & Risk Stratification: Activity Barriers & Cardiac Risk Stratification - 06/06/18 1252      Activity Barriers & Cardiac Risk Stratification   Activity Barriers  Deconditioning;Muscular Weakness    Cardiac Risk Stratification  High       6 Minute Walk: 6 Minute Walk    Row Name 06/06/18 1348 09/13/18 0840       6 Minute Walk   Phase  Initial  Discharge    Distance  1520 feet  1700 feet    Distance % Change  -  11.8 %    Distance Feet Change  -  180 ft    Walk Time  6  minutes  6 minutes    # of Rest Breaks  0  0    MPH  2.88  3.22    METS  2.91  3.72    RPE  11  12    VO2 Peak  10.18  13    Symptoms  No  No    Resting HR  74 bpm  64 bpm    Resting BP  108/64  148/86    Resting Oxygen Saturation   97 %  -    Exercise Oxygen Saturation  during 6 min walk  98 %  97 %    Max Ex. HR  115 bpm  105 bpm    Max Ex. BP  174/76  190/82    2 Minute Post BP  126/62  -       Oxygen Initial Assessment:   Oxygen Re-Evaluation:   Oxygen Discharge (Final Oxygen Re-Evaluation):   Initial Exercise Prescription: Initial Exercise Prescription - 06/06/18 1300      Date of Initial Exercise RX and Referring Provider   Date  06/06/18    Referring Provider  Serafina Royals MD      Treadmill   MPH  2.3    Grade  0.5    Minutes  15    METs  2.93      Elliptical   Level  1    Speed  3    Minutes  15      T5 Nustep   Level  3     SPM  80    Minutes  15    METs  3      Prescription Details   Frequency (times per week)  2    Duration  Progress to 30 minutes of continuous aerobic without signs/symptoms of physical distress      Intensity   THRR 40-80% of Max Heartrate  104-135    Ratings of Perceived Exertion  11-13    Perceived Dyspnea  0-4      Progression   Progression  Continue to progress workloads to maintain intensity without signs/symptoms of physical distress.      Resistance Training   Training Prescription  Yes    Weight  4 lbs    Reps  10-15       Perform Capillary Blood Glucose checks as needed.  Exercise Prescription Changes: Exercise Prescription Changes    Row Name 06/06/18 1200 06/14/18 1000 06/28/18 1600 07/11/18 1600 07/27/18 1400     Response to Exercise   Blood Pressure (Admit)  108/64  136/60  200/100 164/84  138/78  122/72   Blood Pressure (Exercise)  174/76  140/62  170/78  132/70  160/70   Blood Pressure (Exit)  126/62  126/70  146/80  126/70  118/70   Heart Rate (Admit)  74 bpm  65 bpm  48 bpm  72 bpm  59 bpm   Heart Rate (Exercise)  115 bpm  95 bpm  110 bpm  103 bpm  118 bpm   Heart Rate (Exit)  85 bpm  64 bpm  85 bpm  78 bpm  52 bpm   Oxygen Saturation (Admit)  97 %  -  -  -  -   Oxygen Saturation (Exercise)  98 %  -  -  -  -   Rating of Perceived Exertion (Exercise)  _0 Symptoms  none  none  none  none  none  Comments  walk test results  first full day of exercise  second full day of exercise  -  -   Duration  -  Continue with 30 min of aerobic exercise without signs/symptoms of physical distress.  Continue with 30 min of aerobic exercise without signs/symptoms of physical distress.  Continue with 30 min of aerobic exercise without signs/symptoms of physical distress.  Continue with 30 min of aerobic exercise without signs/symptoms of physical distress.   Intensity  -  THRR unchanged  THRR unchanged  THRR unchanged  THRR unchanged     Progression    Progression  -  Continue to progress workloads to maintain intensity without signs/symptoms of physical distress.  Continue to progress workloads to maintain intensity without signs/symptoms of physical distress.  Continue to progress workloads to maintain intensity without signs/symptoms of physical distress.  Continue to progress workloads to maintain intensity without signs/symptoms of physical distress.   Average METs  -  2.9  2.96  3.66  3.41     Resistance Training   Training Prescription  -  Yes  Yes  Yes  Yes   Weight  -  4 lbs  4 lbs  4 lbs  4 lbs   Reps  -  10-15  10-15  10-15  10-15     Interval Training   Interval Training  -  No  No  No  No     Treadmill   MPH  -  -  2.8  2.8  2.8   Grade  -  -  _0 Minutes  -  -  _1 METs  -  -  3.92  3.92  3.92     Elliptical   Level  -  _2 Speed  -  _3 4.5   Minutes  -  _4 T5 Nustep   Level  -  _5 Minutes  -  _6 METs  -  2.9  2  3.4  2.9     Home Exercise Plan   Plans to continue exercise at  -  [employer] (comment) walking, [employer], [place] (comment) walking, [employer], [place] (comment) walking, [employer], [place] (comment) walking, [employer], firehouse gym   Frequency  -  Add 3 additional days to program exercise sessions.  Add 3 additional days to program exercise sessions.  Add 3 additional days to program exercise sessions.  Add 3 additional days to program exercise sessions.   Initial Home Exercises Provided  -  06/14/18  06/14/18  06/14/18  06/14/18   Row Name 08/23/18 1600 09/07/18 1300 09/20/18 1300 10/05/18 0900       Response to Exercise   Blood Pressure (Admit)  140/80  108/60  124/64  122/68    Blood Pressure (Exercise)  164/82  160/68  174/76  128/70    Blood Pressure (Exit)  132/84  130/84  120/74  122/80    Heart Rate (Admit)  62 bpm  84  bpm  64 bpm  70 bpm    Heart Rate (Exercise)  98 bpm  106 bpm  115 bpm  89 bpm    Heart  Rate (Exit)  68 bpm  74 bpm  70 bpm  67 bpm    Rating of Perceived Exertion (Exercise)  '11  12  11  12    '$ Symptoms  none  none  none  none    Duration  Continue with 30 min of aerobic exercise without signs/symptoms of physical distress.  Continue with 30 min of aerobic exercise without signs/symptoms of physical distress.  Continue with 30 min of aerobic exercise without signs/symptoms of physical distress.  Continue with 30 min of aerobic exercise without signs/symptoms of physical distress.    Intensity  THRR unchanged  THRR unchanged  THRR unchanged  THRR unchanged      Progression   Progression  Continue to progress workloads to maintain intensity without signs/symptoms of physical distress.  Continue to progress workloads to maintain intensity without signs/symptoms of physical distress.  Continue to progress workloads to maintain intensity without signs/symptoms of physical distress.  Continue to progress workloads to maintain intensity without signs/symptoms of physical distress.    Average METs  3.21  3.66  2.77  4.21      Resistance Training   Training Prescription  Yes  Yes  Yes  Yes    Weight  4 lbs  6 lbs  10 lbs  10 lbs    Reps  10-15  10-15  10-15  10-15      Interval Training   Interval Training  No  No  No  No      Treadmill   MPH  2.8  2.8  2.5  2.5    Grade  '2  2  3  5    '$ Minutes  '15  15  15  15    '$ METs  3.92  3.92  3.95  4.64      Elliptical   Level  -  '3  4  4    '$ Speed  -  '4  4  4    '$ Minutes  -  '15  15  15      '$ T5 Nustep   Level  '5  5  5  5    '$ Minutes  '15  15  15  15    '$ METs  2.5  3.4  4  3.6      Home Exercise Plan   Plans to continue exercise at  -  [employer] (comment) walking, [employer], [place] (comment) walking, [employer], [place] (comment) walking, [employer], firehouse gym    Frequency  -   Add 3 additional days to program exercise sessions.  Add 3 additional days to program exercise sessions.  Add 3 additional days to program exercise sessions.    Initial Home Exercises Provided  -  06/14/18  06/14/18  06/14/18       Exercise Comments: Exercise Comments    Row Name 06/14/18 0917 10/27/18 1026         Exercise Comments  First full day of exercise!  Patient was oriented to gym and equipment including functions, settings, policies, and procedures.  Patient's individual exercise prescription and treatment plan were reviewed.  All starting workloads were established based on the results of the 6 minute walk test done at initial orientation visit.  The plan for exercise progression was also introduced and progression will be customized based on patient's performance and goals.  Darrell [name] graduated today from  rehab with 36 sessions completed.  Details of the patient's exercise  prescription and what He needs to do in order to continue the prescription and progress were discussed with patient.  Patient was given a copy of prescription and goals.  Patient verbalized understanding.  Darrell [name] plans to continue to exercise by going to [employer] and gym at station.         Exercise Goals and Review: Exercise Goals    Row Name 06/06/18 1351             Exercise Goals   Increase Physical Activity  Yes       Intervention  Provide advice, education, support and counseling about physical activity/exercise needs.;Develop an individualized exercise prescription for aerobic and resistive training based on initial evaluation findings, risk stratification, comorbidities and participant's personal goals.       Expected Outcomes  Short Term: Attend rehab on a regular basis to increase amount of physical activity.;Long Term: Add in home exercise to make exercise part of routine and to increase amount of physical activity.;Long Term: Exercising regularly at least 3-5 days a week.       Increase Strength  and Stamina  Yes       Intervention  Provide advice, education, support and counseling about physical activity/exercise needs.;Develop an individualized exercise prescription for aerobic and resistive training based on initial evaluation findings, risk stratification, comorbidities and participant's personal goals.       Expected Outcomes  Short Term: Perform resistance training exercises routinely during rehab and add in resistance training at home;Long Term: Improve cardiorespiratory fitness, muscular endurance and strength as measured by increased METs and functional capacity (6MWT);Short Term: Increase workloads from initial exercise prescription for resistance, speed, and METs.       Able to understand and use rate of perceived exertion (RPE) scale  Yes       Intervention  Provide education and explanation on how to use RPE scale       Expected Outcomes  Short Term: Able to use RPE daily in rehab to express subjective intensity level;Long Term:  Able to use RPE to guide intensity level when exercising independently       Knowledge and understanding of Target Heart Rate Range (THRR)  Yes       Intervention  Provide education and explanation of THRR including how the numbers were predicted and where they are located for reference       Expected Outcomes  Short Term: Able to state/look up THRR;Long Term: Able to use THRR to govern intensity when exercising independently;Short Term: Able to use daily as guideline for intensity in rehab       Able to check pulse independently  Yes       Intervention  Provide education and demonstration on how to check pulse in carotid and radial arteries.;Review the importance of being able to check your own pulse for safety during independent exercise       Expected Outcomes  Short Term: Able to explain why pulse checking is important during independent exercise;Long Term: Able to check pulse independently and accurately       Understanding of Exercise Prescription  Yes        Intervention  Provide education, explanation, and written materials on patient's individual exercise prescription       Expected Outcomes  Short Term: Able to explain program exercise prescription;Long Term: Able to explain home exercise prescription to exercise independently          Exercise Goals Re-Evaluation : Exercise Goals Re-Evaluation    Row  Name 06/14/18 9417 06/14/18 1024 06/28/18 1618 06/30/18 0805 07/11/18 1634     Exercise Goal Re-Evaluation   Exercise Goals Review  Increase Physical Activity;Increase Strength and Stamina;Able to understand and use rate of perceived exertion (RPE) scale;Knowledge and understanding of Target Heart Rate Range (THRR);Understanding of Exercise Prescription  Increase Physical Activity;Increase Strength and Stamina;Understanding of Exercise Prescription  Increase Physical Activity;Increase Strength and Stamina;Understanding of Exercise Prescription  Increase Physical Activity;Increase Strength and Stamina;Understanding of Exercise Prescription  Increase Physical Activity;Increase Strength and Stamina;Understanding of Exercise Prescription   Comments  Reviewed RPE scale, THR and program prescription with pt today.  Pt voiced understanding and was given a copy of goals to take home.   Reviewed home exercise guidelines with Darrell [name].  He will continue to go to [employer] and walk.  He also has access to gym at fire house.   Darrell [name] is off to a good start in rehab.  He has completed his second full day of exercise. Today, he had some issues with his blood pressure being high coming in the door, but was able to exercise.  We will continue to monitor his progression.   Darrell [name] is doing well in rehab.  He has been walking around town as they wouldn't let him go to gym.  We talked about just going in to do his cardio and staying off the weight equipment.  He recovered from surgery quickly, but is leveling out on his strength now.  He has not had any afib symptoms and  just a little pain at the insicion site.    Darrell [name] continues to do well in rehab. He comes when his work schedule allows, so he bounces around the different classes.  He is up to level 5 on the T5 NuStep.  We will continue to monitor his progression.    Expected Outcomes  Short: Use RPE daily to regulate intensity. Long: Follow program prescription in THR.  Short: Continue to exericse more regularly.  Long: Continue to increase activity and stamina.   Short: Attend class regularly.  Long: Continue to exercies independently on off days.   Short: Start adding in more cardio at the gym.  Long: Continue to stay active on off days.   Short: Continue to increase workloads.  Long: Continue to add in cardio on off days.    [place] Name 07/21/18 4081 07/27/18 1434 08/09/18 0959 08/23/18 1610 08/30/18 0812     Exercise Goal Re-Evaluation   Exercise Goals Review  Increase Physical Activity;Increase Strength and Stamina;Able to check pulse independently  Increase Physical Activity;Increase Strength and Stamina;Able to check pulse independently  -  Increase Physical Activity;Increase Strength and Stamina;Able to check pulse independently  Increase Physical Activity;Increase Strength and Stamina;Able to check pulse independently   Comments  Darrell [name] continues do very well in rehab. He is doing cardio walking and using the Elliptical at [employer]. He is not using weights there. He is walking in the parade this weekend.  Darrell [name] is up to level on the elliptical now.  He continues to do well.  He is planning to walk while out on vacation to [place].  We will continue to monitor his progress.   Out since last review  Darrell [name] returned today after his trip to [place].  He enjoyed his trip and was able to return to his normal workloads.  We will continue to monitor his progress.   Darrell [name] is doing well in rehab. He is going to the gym at the station to do  his cardio and lift some weights. He is starting to get his strength and stamina  back.  He is still working his way back up with his strength since he cut it in half after surgery.     Expected Outcomes  Short: Continue to workout 2-3 extra days. Long: Darrell [name] would like to add weight machines back to his workout regimen. Start with 15 lbs and work up slowly.  Short: Walk while out on vacation.  Long: Continue to regain strength back.   -  Short: Attend classes regularly again.  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   Short: Continue to get to class regularly.  Long: Continue to get in his exercise especially cardio.    [place] Name 09/07/18 1335 09/20/18 0841 10/05/18 0905 10/19/18 0947       Exercise Goal Re-Evaluation   Exercise Goals Review  Increase Physical Activity;Increase Strength and Stamina;Able to check pulse independently  Increase Physical Activity;Increase Strength and Stamina;Able to understand and use rate of perceived exertion (RPE) scale;Knowledge and understanding of Target Heart Rate Range (THRR);Understanding of Exercise Prescription  Increase Physical Activity;Increase Strength and Stamina;Understanding of Exercise Prescription  -    Comments  Darrell [name] continues to do well in rehab.  He is up to 3.4 METs on the T5 NuStep.  We will continue to monitor his progress.   Darrell [name] plans to continue at [employer] for exercise.  He has clearancce from his Dr to do more strength work and wants to build back muscle he feels he has lost.  He is considering going to the [employer] and working with  Physiological scientist .  Darrell [name] will be graduating at his next visit.  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  Out since last review    Expected Outcomes  Short: Increase treadmill and talk about intervals.  Long: Continue to exercise regularly  Short - get info about [employer] and training Long - safely build muscle mass   Short: Graduate!!  Long: Continue to exercise independently!!  -       Discharge Exercise Prescription (Final  Exercise Prescription Changes): Exercise Prescription Changes - 10/05/18 0900      Response to Exercise   Blood Pressure (Admit)  122/68    Blood Pressure (Exercise)  128/70    Blood Pressure (Exit)  122/80    Heart Rate (Admit)  70 bpm    Heart Rate (Exercise)  89 bpm    Heart Rate (Exit)  67 bpm    Rating of Perceived Exertion (Exercise)  12    Symptoms  none    Duration  Continue with 30 min of aerobic exercise without signs/symptoms of physical distress.    Intensity  THRR unchanged      Progression   Progression  Continue to progress workloads to maintain intensity without signs/symptoms of physical distress.    Average METs  4.21      Resistance Training   Training Prescription  Yes    Weight  10 lbs    Reps  10-15      Interval Training   Interval Training  No      Treadmill   MPH  2.5    Grade  5    Minutes  15    METs  4.64      Elliptical   Level  4    Speed  4    Minutes  15  T5 Nustep   Level  5    Minutes  15    METs  3.6      Home Exercise Plan   Plans to continue exercise at  [employer] (comment)   walking, [employer], firehouse gym   Frequency  Add 3 additional days to program exercise sessions.    Initial Home Exercises Provided  06/14/18       Nutrition:  Target Goals: Understanding of nutrition guidelines, daily intake of sodium '1500mg'$ , cholesterol '200mg'$ , calories 30% from fat and 7% or less from saturated fats, daily to have 5 or more servings of fruits and vegetables.  Biometrics: Pre Biometrics - 06/06/18 1353      Pre Biometrics   Height  5' 8.1" (1.73 m)  (Pended)     Weight  222 lb 11.2 oz (101 kg)  (Pended)     Waist Circumference  42 inches  (Pended)     Hip Circumference  43 inches  (Pended)     Waist to Hip Ratio  0.98 %  (Pended)     BMI (Calculated)  33.75  (Pended)     Single Leg Stand  30 seconds  (Pended)       Post Biometrics - 09/13/18 0839       Post  Biometrics   Height  5' 8.1" (1.73 m)     Weight  226 lb (102.5 kg)    Waist Circumference  43 inches    Hip Circumference  43.5 inches    Waist to Hip Ratio  0.99 %    BMI (Calculated)  34.25    Single Leg Stand  30 seconds       Nutrition Therapy Plan and Nutrition Goals: Nutrition Therapy & Goals - 06/14/18 1007      Nutrition Therapy   Diet  DASH    Drug/Food Interactions  Statins/Certain Fruits    Protein (specify units)  9oz    Fiber  30 grams    Whole Grain Foods  3 servings   chooses whole grains half of the time   Saturated Fats  16 max. grams    Fruits and Vegetables  5 servings/day   8 ideal; eats vegetables regularly but not fruits   Sodium  1500 grams      Personal Nutrition Goals   Nutrition Goal  Increase fruit intake by at least one additional serving per week    Personal Goal #2  Increase fluid intake by at least one additional 8oz glass per day. Try adding extra fluids around exercise times first    Comments  His [name] has been helping to change his diet by cooking without salt, buying low sodium products and decreasing portion sizes at meal times. She provides vegetables and whole grain choices regularly. They do not eat out often but he does choose fried foods when eating out occasionally. They eat a variety of protein sources and they do not fry foods at home. They ask for french fries without salt when eating at [facility]. Eats 3 meals/day; breakfast: cheerios with 2% milk, lunch: leftovers, sandwich, [employer], dinner: varies (soups, roast, chicken, shrimp, wheat pasta, brown rice, beef tips, salads, vegetables, baked potato). He drinks mostly water with the occasional sweet tea or diluted orange juice. He does not eat fruit regularly but does eat vegetables that his [name] cooks      [place], educate and counsel regarding individualized specific dietary modifications aiming towards targeted core components such  as weight, hypertension, lipid management, diabetes, heart  failure and other comorbidities.    Expected Outcomes  Short Term Goal: Understand basic principles of dietary content, such as calories, fat, sodium, cholesterol and nutrients.;Short Term Goal: A plan has been developed with personal nutrition goals set during dietitian appointment.;Long Term Goal: Adherence to prescribed nutrition plan.       Nutrition Assessments: Nutrition Assessments - 06/06/18 1302      MEDFICTS Scores   Pre Score  42       Nutrition Goals Re-Evaluation: Nutrition Goals Re-Evaluation    Row Name 06/14/18 1012 07/12/18 0829 08/23/18 0835         Goals   Nutrition Goal  Increase fluid intake by at least one additional 8oz glass per day. Try adding extra fluids around exercise times first  Increase fruit intake by at least one additional serving per week; Increase fluid intake by at least one additional 8oz glass per day, try adding extra fluids around exercise times first  Increase fruit intake by at least one additional serving per week; Increase fluid intake by at least one additional 8oz glass/day     Comment  He drinks mostly zero sugar beverages but does not feel that he drinks enough each day  He has been making an effort to drink more fluids each day. Today in class he had a glass of water with him while exercising. He feels that overall he is "doing better" with his diet and his [name] is in charge of the food he eats and the cooking at home, who has been trying to provide healthier options and use less salt. He has lost a couple of pounds. He has not yet been intentional about eating more fruit  He reports to have been out of his regular routine during the holidays and ate some foods that he does not typically eat. He has continued to drink more fluids but has not eaten many more fruits. He and his [name] plan to get back into the swing of preparing heart-healthy meals     Expected Outcome  He will try to drink at least one more glass of water daily, ideally around  exercise sessions  He will work on eating more fruit each week and continue to increase fluid intake. He will work with his [name] to have healthier options to eat at home  He will maintain habit of drinking more fluids. Continue to prepare heart healthy meals at home. Long term goal: eat more fruit       Personal Goal #2 Re-Evaluation   Personal Goal #2  Increase fruit intake by at least one additional serving per week  -  -        Nutrition Goals Discharge (Final Nutrition Goals Re-Evaluation): Nutrition Goals Re-Evaluation - 08/23/18 0835      Goals   Nutrition Goal  Increase fruit intake by at least one additional serving per week; Increase fluid intake by at least one additional 8oz glass/day    Comment  He reports to have been out of his regular routine during the holidays and ate some foods that he does not typically eat. He has continued to drink more fluids but has not eaten many more fruits. He and his [name] plan to get back into the swing of preparing heart-healthy meals    Expected Outcome  He will maintain habit of drinking more fluids. Continue to prepare heart healthy meals at home. Long term goal: eat more fruit  Psychosocial: Target Goals: Acknowledge presence or absence of significant depression and/or stress, maximize coping skills, provide positive support system. Participant is able to verbalize types and ability to use techniques and skills needed for reducing stress and depression.   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 Darrell [name] is staying with her Mom every night. 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 "That is hard"  Sleeps to about 3 AM, sometimes takes time to get back to sleep until up for the day at 545        Quality of Life Scores:  Quality of Life - 09/20/18 1211      Quality of Life Scores   Health/Function Pre  6.1 %    Health/Function Post  14 %    Health/Function % Change  129.51 %    Socioeconomic Pre  26.69 %    Socioeconomic Post  24 %    Socioeconomic % Change   -10.08 %    Psych/Spiritual Pre  14.29 %    Psych/Spiritual Post  21.43 %    Psych/Spiritual % Change  49.97 %    Family Pre  21.6 %    Family Post  26.4 %    Family % Change  22.22 %    GLOBAL Pre  14.66 %    GLOBAL Post  19.54 %    GLOBAL % Change  33.29 %      Scores of 19 and below usually indicate a poorer quality of life in these areas.  A difference of  2-3 points is a clinically meaningful difference.  A difference of 2-3 points in the total score of the Quality of Life Index has been associated with significant improvement in overall quality of life, self-image, physical symptoms, and general health in studies assessing change in quality of life.  PHQ-9: Recent Review Flowsheet Data    Depression screen [facility] 2/9 09/20/2018 06/30/2018 06/09/2018 06/06/2018 12/20/2017   Decreased Interest 1 0 '1 1 2   '$ Down, Depressed, Hopeless '1 1 1 1 3   '$ PHQ - 2 Score '2 1 2 2 5   '$ Altered sleeping '3 3 1 3 3   '$ Tired, decreased energy '1 1 1 1 3   '$ Change in appetite 3 2 0 0 0   Feeling bad or failure about yourself  0 0 '1 2 1   '$ Trouble concentrating 0 0 0 0 0   Moving slowly or fidgety/restless 0 0 0 0 0   Suicidal thoughts 0 0 0 0 0   PHQ-9 Score '9 7 5 8 12   '$ Difficult doing work/chores Not difficult at all Somewhat difficult - Somewhat difficult -     Interpretation of Total Score  Total Score Depression Severity:  1-4 = Minimal depression, 5-9 = Mild depression, 10-14 = Moderate depression, 15-19 = Moderately severe depression, 20-27 = Severe depression   Psychosocial Evaluation and Intervention: Psychosocial Evaluation - 06/14/18 1025      Psychosocial Evaluation & Interventions   Interventions  Stress management  education;Encouraged to exercise with the program and follow exercise prescription    Comments  Mr. 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  Counselor met with him today for initial psychosocial evaluation.  He is a 73 year old who has a family history of heart disease.  He has a strong support system with a spouse of 60 years; lots of family locally and active involvement with his local church.  Darrell [name] reports only getting maybe ~5 hours/night sleep.  He has a history of OSA but is no longer using the CPAP once he lost weight.  He has a good appetite.  Darrell [name] denies a history of depression or anxiety or any current symptoms and states he is typically a positive person.  He has multiple stressors currently with his own health; his 43 year old mother-in-law is being cared for by Darrell [name]'s spouse; and Darrell [name] is the [place] which can be stressful at times as well.   He has goals to feel better overall; to lose some weight and increase his stamina and strength.  Staff will follow with Darrell [name].    Expected Outcomes  Short:  Darrell [name] will develop some positive coping strategies by attending the educational components of this program and learn to manage his stress better.  He will also meet with the dietician to address his weight loss goals.  Darrell [name] will benefit from exercising consistently for his health and mental health and as a coping strategy.  Long:  Darrell [name] will develop positive self-care habits from this program for his continued health and mental health.      Continue Psychosocial Services   Follow up required by staff       Psychosocial Re-Evaluation: Psychosocial Re-Evaluation    Meade Name 06/30/18 [phone] 07/21/18 0816 08/30/18 0813 09/20/18 0832       Psychosocial Re-Evaluation   Current issues with  Current Stress Concerns;Current Sleep Concerns  Current Stress Concerns;Current Sleep Concerns  Current Stress Concerns;Current Sleep Concerns  Current Stress  Concerns;Current Sleep Concerns    Comments  Darrell [name] will be going to visit his granddaughter for Christmas and will miss three weeks starting in mid [place].  He is looking forward to seeing them on Christmas.  He is not sleeping very well and got an extra 54mn at night.  Not sure what is triggering it.  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  He will need to work about 13 METs to return.   He has not talked to anyone about his sleep and is not  taking anything to help.  He is able to get to sleep its the sleeping long enough that is the problem. PHQ score has gone down by one point.  It was as low as 5 at the doctors office, but it is improving.   JSonia Sidehad a lot of work to get done before his trip. His sleep is not good. He goes to bed at 11:30pm and was waking up around 3:22am. and now sleeping until 5 am, so it has improved some. He does not usually go back to sleep when he wakes in the middle of the night but will try our Relaxation techniques to return to sleep. [place] of [place] sons house. He will stay active while he is there.  JSonia Sidehas been doing well mentally.  He continues have a lot of stress through work.  He enjoyed his visit with his grandbaby for three weeks.  He was able to sleep well last night.  He is still waking up around 3am but has been able to get back to sleep more frequently.  He has a town Programmer, applications and those tend to keep him up more.   Darrell [name] has slept well the past couple nights and he feels he is on the right track getting better sleep.  Work stress "is what it is".  He is coping better by not answering the phone all the time.    Expected Outcomes  Short: Continue to cope with job stress.  Long: Continue to work on sleep.   Short: Continue to do his jobs  within his current capabilites. He is going to alarms but not doing anything active. He tells everyone to stay back. Long: Continue to work on sleep.  Short: Continue to cope with job stresses positiviely. Long: Continue to work on sleeping better.   Short - continue to use coping strategies such as hobbies to reduce work stress Long - maintain manageable stress levels through coping strategies    Interventions  Stress management education;Encouraged to attend Cardiac Rehabilitation for the exercise  -  Stress management education;Encouraged to attend Cardiac Rehabilitation for the exercise  Encouraged to attend Cardiac Rehabilitation for the exercise    Continue Psychosocial Services   Follow up required by staff  -  Follow up required by staff  Follow up required by staff    Comments  MOther in law in Skilled facility. Darrell [name] is staying with her Mom every night. 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 "That is hard"  Sleeps to about 3 AM, sometimes takes time to get back to sleep until up for the day at 545  -  -  -      Initial Review   Source of Stress Concerns  Family;Occupation  -  -  -       Psychosocial Discharge (Final Psychosocial Re-Evaluation): Psychosocial Re-Evaluation - 09/20/18 [id number]      Psychosocial Re-Evaluation   Current issues with  Current Stress Concerns;Current Sleep Concerns    Comments  Darrell [name] has slept well the past couple nights and he feels he is on the right track getting better sleep.  Work stress "is what it is".  He is coping better by not answering the phone all the time.    Expected Outcomes  Short - continue to use coping strategies such as hobbies to reduce work stress Long - maintain manageable stress levels through coping strategies    Interventions  Encouraged to attend Cardiac Rehabilitation for the exercise    Continue Psychosocial Services   Follow up required by staff       Vocational Rehabilitation: Provide vocational rehab assistance to  qualifying candidates.   Vocational Rehab Evaluation & Intervention: Vocational Rehab - 06/06/18 1304      Initial Vocational Rehab Evaluation & Intervention   Assessment shows need for Vocational Rehabilitation  No       Education: Education Goals: Education classes will be provided on a variety of topics geared toward better understanding of heart health and risk factor modification. Participant will state understanding/return demonstration of topics presented as noted by education test scores.  Learning Barriers/Preferences: Learning Barriers/Preferences - 06/06/18 1303      Learning Barriers/Preferences   Learning Barriers  None    Learning Preferences  None       Education Topics:  AED/CPR: - Group verbal and written instruction with the use of models to  demonstrate the basic use of the AED with the basic ABC's of resuscitation.   General Nutrition Guidelines/Fats and Fiber: -Group instruction provided by verbal, written material, models and posters to present the general guidelines for heart healthy nutrition. Gives an explanation and review of dietary fats and fiber.   Cardiac Rehab from 09/29/2018 in [facility] Cardiac and Pulmonary Rehab  Date  09/20/18  Educator  SB  Instruction Review Code  1- Verbalizes Understanding      Controlling Sodium/Reading Food Labels: -Group verbal and written material supporting the discussion of sodium use in heart healthy nutrition. Review and explanation with models, verbal and written materials for utilization of the food label.   Cardiac Rehab from 09/29/2018 in [facility] Cardiac and Pulmonary Rehab  Date  09/01/18  Educator  LB  Instruction Review Code  1- Verbalizes Understanding      Exercise Physiology & General Exercise Guidelines: - Group verbal and written instruction with models to review the exercise physiology of the cardiovascular system and associated critical values. Provides general exercise guidelines with specific  guidelines to those with heart or lung disease.    Cardiac Rehab from 09/29/2018 in [facility] Cardiac and Pulmonary Rehab  Date  08/30/18  Educator  LB  Instruction Review Code  1- Verbalizes Understanding      Aerobic Exercise & Resistance Training: - Gives group verbal and written instruction on the various components of exercise. Focuses on aerobic and resistive training programs and the benefits of this training and how to safely progress through these programs..   Cardiac Rehab from 09/29/2018 in [facility] Cardiac and Pulmonary Rehab  Date  09/08/18  Educator  [place]  Instruction Review Code  1- Verbalizes Understanding      Flexibility, Balance, Mind/Body Relaxation: Provides group verbal/written instruction on the benefits of flexibility and balance training, including mind/body exercise modes such as yoga, pilates and tai chi.  Demonstration and skill practice provided.   Cardiac Rehab from 09/29/2018 in [facility] Cardiac and Pulmonary Rehab  Date  09/13/18  Educator  AS  Instruction Review Code  1- Verbalizes Understanding      Stress and Anxiety: - Provides group verbal and written instruction about the health risks of elevated stress and causes of high stress.  Discuss the correlation between heart/lung disease and anxiety and treatment options. Review healthy ways to manage with stress and anxiety.   Cardiac Rehab from 01/04/2018 in [facility] Cardiac and Pulmonary Rehab  Date  12/14/17  Educator  [facility]  Instruction Review Code  1- Verbalizes Understanding      Depression: - Provides group verbal and written instruction on the correlation between heart/lung disease and depressed mood, treatment options, and the stigmas associated with seeking treatment.   Cardiac Rehab from 09/29/2018 in [facility] Cardiac and Pulmonary Rehab  Date  08/23/18  Educator  [facility]  Instruction Review Code  1- Verbalizes Understanding      Anatomy & Physiology of the Heart: - Group verbal and written instruction and models  provide basic cardiac anatomy and physiology, with the coronary electrical and arterial systems. Review of Valvular disease and Heart Failure   Cardiac Rehab from 09/29/2018 in [facility] Cardiac and Pulmonary Rehab  Date  09/22/18  Educator  CE  Instruction Review Code  1- Verbalizes Understanding      Cardiac Procedures: - Group verbal and written instruction to review commonly prescribed medications for heart disease. Reviews the medication, class of the drug, and [name] effects. Includes the steps to properly store meds and  maintain the prescription regimen. (beta blockers and nitrates)   Cardiac Rehab from 09/29/2018 in [facility] Cardiac and Pulmonary Rehab  Date  07/26/18  Educator  SB  Instruction Review Code  1- Verbalizes Understanding      Cardiac Medications I: - Group verbal and written instruction to review commonly prescribed medications for heart disease. Reviews the medication, class of the drug, and [name] effects. Includes the steps to properly store meds and maintain the prescription regimen.   Cardiac Rehab from 01/04/2018 in [facility] Cardiac and Pulmonary Rehab  Date  12/21/17  Educator  SB  Instruction Review Code  1- Verbalizes Understanding      Cardiac Medications II: -Group verbal and written instruction to review commonly prescribed medications for heart disease. Reviews the medication, class of the drug, and [name] effects. (all other drug classes)   Cardiac Rehab from 09/29/2018 in [facility] Cardiac and Pulmonary Rehab  Date  07/19/18  Educator  SB  Instruction Review Code  1- Verbalizes Understanding       Go Sex-Intimacy & Heart Disease, Get SMART - Goal Setting: - Group verbal and written instruction through game format to discuss heart disease and the return to sexual intimacy. Provides group verbal and written material to discuss and apply goal setting through the application of the S.M.A.R.T. Method.   Cardiac Rehab from 09/29/2018 in [facility] Cardiac and Pulmonary Rehab  Date   07/26/18  Educator  SB  Instruction Review Code  1- Verbalizes Understanding      Other Matters of the Heart: - Provides group verbal, written materials and models to describe Stable Angina and Peripheral Artery. Includes description of the disease process and treatment options available to the cardiac patient.   Cardiac Rehab from 09/29/2018 in [facility] Cardiac and Pulmonary Rehab  Date  09/22/18  Educator  CE  Instruction Review Code  1- Verbalizes Understanding      Exercise & Equipment Safety: - Individual verbal instruction and demonstration of equipment use and safety with use of the equipment.   Cardiac Rehab from 09/29/2018 in [facility] Cardiac and Pulmonary Rehab  Date  06/06/18  Educator  Sn  Instruction Review Code  1- Verbalizes Understanding      Infection Prevention: - Provides verbal and written material to individual with discussion of infection control including proper hand washing and proper equipment cleaning during exercise session.   Cardiac Rehab from 09/29/2018 in [facility] Cardiac and Pulmonary Rehab  Date  06/06/18  Educator  Sb  Instruction Review Code  1- Verbalizes Understanding      Falls Prevention: - Provides verbal and written material to individual with discussion of falls prevention and safety.   Cardiac Rehab from 09/29/2018 in [facility] Cardiac and Pulmonary Rehab  Date  06/06/18  Educator  Sb  Instruction Review Code  1- Verbalizes Understanding      Diabetes: - Individual verbal and written instruction to review signs/symptoms of diabetes, desired ranges of glucose level fasting, after meals and with exercise. Acknowledge that pre and post exercise glucose checks will be done for 3 sessions at entry of program.   Know Your Numbers and Risk Factors: -Group verbal and written instruction about important numbers in your health.  Discussion of what are risk factors and how they play a role in the disease process.  Review of Cholesterol, Blood Pressure,  Diabetes, and BMI and the role they play in your overall health.   Cardiac Rehab from 09/29/2018 in [facility] Cardiac and Pulmonary Rehab  Date  07/19/18  Educator  SB  Instruction Review Code  1- Verbalizes Understanding      Sleep Hygiene: -Provides group verbal and written instruction about how sleep can affect your health.  Define sleep hygiene, discuss sleep cycles and impact of sleep habits. Review good sleep hygiene tips.    Cardiac Rehab from 09/29/2018 in [facility] Cardiac and Pulmonary Rehab  Date  09/20/18  Educator  [facility]  Instruction Review Code  1- Verbalizes Understanding      Other: -Provides group and verbal instruction on various topics (see comments)   Knowledge Questionnaire Score: Knowledge Questionnaire Score - 06/06/18 1303      Knowledge Questionnaire Score   Pre Score  24/26   reviewd correct responses with Darrell [name] todauy. He verbalized understanding of the responses and had no further questions.       Core Components/Risk Factors/Patient Goals at Admission: Personal Goals and Risk Factors at Admission - 06/06/18 1305      Core Components/Risk Factors/Patient Goals on Admission    Weight Management  Yes;Obesity;Weight Loss    Admit Weight  214 lb (97.1 kg)    Goal Weight: Short Term  212 lb (96.2 kg)    Goal Weight: Long Term  190 lb (86.2 kg)    Expected Outcomes  Short Term: Continue to assess and modify interventions until short term weight is achieved;Long Term: Adherence to nutrition and physical activity/exercise program aimed toward attainment of established weight goal;Weight Loss: Understanding of general recommendations for a balanced deficit meal plan, which promotes 1-2 lb weight loss per week and includes a negative energy balance of [phone] kcal/d    Hypertension  Yes    Intervention  Provide education on lifestyle modifcations including regular physical activity/exercise, weight management, moderate sodium restriction and increased consumption of fresh  fruit, vegetables, and low fat dairy, alcohol moderation, and smoking cessation.;Monitor prescription use compliance.    Expected Outcomes  Short Term: Continued assessment and intervention until BP is < 140/15m HG in hypertensive participants. < 130/831mHG in hypertensive participants with diabetes, heart failure or chronic kidney disease.;Long Term: Maintenance of blood pressure at goal levels.    Lipids  Yes    Intervention  Provide education and support for participant on nutrition & aerobic/resistive exercise along with prescribed medications to achieve LDL '70mg'$ , HDL >'40mg'$ .    Expected Outcomes  Short Term: Participant states understanding of desired cholesterol values and is compliant with medications prescribed. Participant is following exercise prescription and nutrition guidelines.;Long Term: Cholesterol controlled with medications as prescribed, with individualized exercise RX and with personalized nutrition plan. Value goals: LDL < '70mg'$ , HDL > 40 mg.       Core Components/Risk Factors/Patient Goals Review:  Goals and Risk Factor Review    Row Name 06/30/18 0816102/05/19 0824 08/30/18 0815 09/20/18 0825       Core Components/Risk Factors/Patient Goals Review   Personal Goals Review  Weight Management/Obesity;Hypertension;Lipids  Weight Management/Obesity;Hypertension;Lipids  Weight Management/Obesity;Hypertension;Lipids  Weight Management/Obesity;Hypertension;Lipids    Review  Darrell weight is climbing again.  He is eating but not always what is healthy.  He is doing well with his blood pressures other than the other day when he missed his medication in the morning.  He checks it at home twice a day.  He is doing well on his medications and may adjust his blood pressure meds some as it sometimes is slightly elevated.   We are going to add some more Interval training to try to give him a jump start his  weight loss. Blood pressure has been high. He takes his meds regularly. We will send a  note with BP's to Dr. [name] to see if he wants to make meds changes.   Darrell [name] is still not losing as much weight as he would like.  He is cheating on his diet some and went out last night too.  The intervals are helping and he is doing them at the station as well.  Blood pressures are starting to get better since starting his pravastatin and trying to get his cholesterol numbers even lower.  Overall, he is doing well.   Darrell [name] has not lost weight but is maintaining. He is working on Editor, commissioning to build muscle mass.  (with permission from his Dr).   He is eating smaller portions but not always the right things.        Expected Outcomes  Short: Get in more cardio to help with weight loss.  Long: Continue to monitor blood pressures.   Short: Start HIIT on cardio equipment.  Long: Continue to take meds and monitor blood pressure.   Short: Continue to work on weight loss.  Long: Continue to keep close eye on pressures.   Short - continue to exercise and use intervals Long - continue to work on  building strength       Core Components/Risk Factors/Patient Goals at Discharge (Final Review):  Goals and Risk Factor Review - 09/20/18 0825      Core Components/Risk Factors/Patient Goals Review   Personal Goals Review  Weight Management/Obesity;Hypertension;Lipids    Review  Darrell [name] has not lost weight but is maintaining. He is working on Editor, commissioning to build muscle mass.  (with permission from his Dr).   He is eating smaller portions but not always the right things.        Expected Outcomes  Short - continue to exercise and use intervals Long - continue to work on  building strength       ITP Comments: ITP Comments    Row Name 06/06/18 1240 06/22/18 0559 06/28/18 0818 07/20/18 0607 07/27/18 1433   ITP Comments  Medical review completed today. New diagnosis new program start.  ITP sent to Dr Sabra Heck for review, changes as needed and signature. Documentation of diagnosis can be found in [facility] 9/03  visit  30 day review. Continue with ITP unless direccted changes per Medical Director Chart Review. New to program  Darrell blood pressure was elevated when he came in this morning (200/102).  He sat for a while and was able to come down to the 170s.  He did the T5 and treadmill today without any problems.   30 day review. Continue with ITP unless direccted changes per Medical Director Chart Review.  Darrell [name] will be out for three weeks going to visit his grands in [place] for Christmas.    [place] Name 08/16/18 [phone] 09/14/18 0936 09/29/18 0755 10/12/18 0542 10/19/18 0947   ITP Comments  30 Day Review. Continue with ITP unless directed changes per Medical Director review  30 Day Review. Continue with ITP unless directed changes per Medical Director review  Darrell [name] is going to [place] tomorrow for grandson's birthday party.  He still wants to come to finish up his last visits.   30 day review.Continue with ITP unless changes directed by Medical Director review.  Called to check on Darrell [name].  He has been out since 2/13 and was on vacation for a week.  He has one more visit.   [place] Name  10/27/18 1241           ITP Comments  Discharge ITP sent and signed by Dr. Sabra Heck.  Discharge Summary routed to PCP and cardiologist.          Comments: Discharge ITP

## 2018-11-10 ENCOUNTER — Telehealth: Payer: Self-pay

## 2018-11-17 ENCOUNTER — Ambulatory Visit: Payer: Medicare Other | Admitting: *Deleted

## 2018-11-17 ENCOUNTER — Other Ambulatory Visit: Payer: Self-pay

## 2018-11-17 DIAGNOSIS — G4733 Obstructive sleep apnea (adult) (pediatric): Secondary | ICD-10-CM

## 2018-11-17 NOTE — Chronic Care Management (AMB) (Signed)
  Chronic Care Management   Initial Visit Note  11/17/2018 Name: RAYFORD WILLIAMSEN MRN: [id number] DOB: 1946/03/24  Referred by: Guadalupe Maple, MD Reason for referral : Chronic Care Management (Initial Outreach)   KAINEN STRUCKMAN is a 73 y.o. year old male who is a primary care patient of Crissman, Jeannette How, MD. The CCM team was consulted for assistance with chronic disease management and care coordination needs.   Review of patient status, including review of consultants reports, relevant laboratory and other test results, and collaboration with appropriate care team members and the patient's provider was performed as part of comprehensive patient evaluation and provision of chronic care management services.    SDOH (Social Determinants of Health) screening performed today. See Care Plan Entry related to challenges with: NONE  Objective:   Goals Addressed    . "I didn't get to do my sleep study" (pt-stated)       Current Barriers:  Marland Kitchen Knowledge Deficits related to follow up on sleep study plans -  Mr. Moxey was referred for a sleep center evaluation prior to the onset and restrictions related to COVID-19. He states his appointment has not been scheduled but he does wish to have the sleep study when available.   Nurse Case Manager Clinical Goal(s):  Marland Kitchen Over the next 30 days, patient will verbalize understanding of plan for sleep study follow up  Interventions:  . Evaluation of current treatment plan related to sleep study eval/needs and patient's adherence to plan as established by provider. . Advised patient to follow up with sleep center when restrictions are removed . Discussed plans with patient for ongoing care management follow up and provided patient with direct contact information for care management team . Offered to assist patient with sleep center engagement as needed  Patient Self Care Activities:  . Self administers medications as prescribed . Attends all scheduled  provider appointments . Performs ADL's independently . Performs IADL's independently . Calls provider office for new concerns or questions  Initial goal documentation       Mr. Trupiano was given information about Chronic Care Management services today including:  1. CCM service includes personalized support from designated clinical staff supervised by his physician, including individualized plan of care and coordination with other care providers 2. 24/7 contact phone numbers for assistance for urgent and routine care needs. 3. Service will only be billed when office clinical staff spend 20 minutes or more in a month to coordinate care. 4. Only one practitioner may furnish and bill the service in a calendar month. 5. The patient may stop CCM services at any time (effective at the end of the month) by phone call to the office staff. 6. The patient will be responsible for cost sharing (co-pay) of up to 20% of the service fee (after annual deductible is met).  Patient agreed to services and verbal consent obtained.   The CM team will reach out to the patient again over the next 30 days.   Janalyn Shy MHA,BSN,RN,CCM Nurse Care Coordinator [facility] Care Management [phone]

## 2018-11-17 NOTE — Patient Instructions (Signed)
Visit Information  Goals Addressed    . "I didn't get to do my sleep study" (pt-stated)       Current Barriers:  Marland Kitchen Knowledge Deficits related to follow up on sleep study plans -  Mr. Cottier was referred for a sleep center evaluation prior to the onset and restrictions related to COVID-19. He states his appointment has not been scheduled but he does wish to have the sleep study when available.   Nurse Case Manager Clinical Goal(s):  Marland Kitchen Over the next 30 days, patient will verbalize understanding of plan for sleep study follow up  Interventions:  . Evaluation of current treatment plan related to sleep study eval/needs and patient's adherence to plan as established by provider. . Advised patient to follow up with sleep center when restrictions are removed . Discussed plans with patient for ongoing care management follow up and provided patient with direct contact information for care management team  Patient Self Care Activities:  . Self administers medications as prescribed . Attends all scheduled provider appointments . Performs ADL's independently . Performs IADL's independently . Calls provider office for new concerns or questions  Initial goal documentation        Mr. Bogus was given information about Chronic Care Management services today including:  1. CCM service includes personalized support from designated clinical staff supervised by his physician, including individualized plan of care and coordination with other care providers 2. 24/7 contact phone numbers for assistance for urgent and routine care needs. 3. Service will only be billed when office clinical staff spend 20 minutes or more in a month to coordinate care. 4. Only one practitioner may furnish and bill the service in a calendar month. 5. The patient may stop CCM services at any time (effective at the end of the month) by phone call to the office staff. 6. The patient will be responsible for cost sharing  (co-pay) of up to 20% of the service fee (after annual deductible is met).  Patient agreed to services and verbal consent obtained.   The patient verbalized understanding of instructions provided today and declined a print copy of patient instruction materials.   The CM team will reach out to the patient again over the next 30 days.   Janalyn Shy MHA,BSN,RN,CCM Nurse Care Coordinator [facility] Care Management [phone]

## 2018-11-18 ENCOUNTER — Ambulatory Visit: Payer: Medicare Other

## 2018-12-02 ENCOUNTER — Inpatient Hospital Stay: Payer: Medicare Other | Attending: Hematology and Oncology

## 2018-12-06 ENCOUNTER — Telehealth: Payer: Self-pay

## 2018-12-07 DIAGNOSIS — C61 Malignant neoplasm of prostate: Secondary | ICD-10-CM | POA: Diagnosis not present

## 2018-12-07 DIAGNOSIS — D400 Neoplasm of uncertain behavior of prostate: Secondary | ICD-10-CM | POA: Diagnosis not present

## 2018-12-12 ENCOUNTER — Other Ambulatory Visit: Payer: Self-pay | Admitting: Urology

## 2018-12-12 DIAGNOSIS — C61 Malignant neoplasm of prostate: Secondary | ICD-10-CM

## 2018-12-12 DIAGNOSIS — D400 Neoplasm of uncertain behavior of prostate: Secondary | ICD-10-CM | POA: Diagnosis not present

## 2018-12-12 DIAGNOSIS — N5201 Erectile dysfunction due to arterial insufficiency: Secondary | ICD-10-CM | POA: Diagnosis not present

## 2018-12-12 DIAGNOSIS — R972 Elevated prostate specific antigen [PSA]: Secondary | ICD-10-CM

## 2018-12-13 ENCOUNTER — Other Ambulatory Visit: Payer: Self-pay

## 2018-12-13 ENCOUNTER — Ambulatory Visit
Admission: RE | Admit: 2018-12-13 | Discharge: 2018-12-13 | Disposition: A | Discharge status: Home / Self Care | Payer: Medicare Other | Source: Ambulatory Visit | Attending: Urology | Admitting: Urology

## 2018-12-13 DIAGNOSIS — R972 Elevated prostate specific antigen [PSA]: Secondary | ICD-10-CM | POA: Diagnosis not present

## 2018-12-13 DIAGNOSIS — C61 Malignant neoplasm of prostate: Secondary | ICD-10-CM | POA: Diagnosis not present

## 2018-12-13 LAB — POCT I-STAT CREATININE: Creatinine, Ser: 1.2 mg/dL (ref 0.61–1.24)

## 2018-12-13 MED ORDER — GADOBUTROL 1 MMOL/ML IV SOLN
10.0000 mL | Freq: Once | INTRAVENOUS | Status: AC | PRN
  Administered 2018-12-13: 19:00:00 10 mL via INTRAVENOUS

## 2018-12-15 ENCOUNTER — Other Ambulatory Visit: Payer: Self-pay | Admitting: Urology

## 2018-12-15 ENCOUNTER — Ambulatory Visit: Admission: RE | Admit: 2018-12-15 | Payer: Medicare Other | Source: Ambulatory Visit

## 2018-12-15 DIAGNOSIS — R972 Elevated prostate specific antigen [PSA]: Secondary | ICD-10-CM

## 2018-12-15 DIAGNOSIS — C61 Malignant neoplasm of prostate: Secondary | ICD-10-CM

## 2018-12-15 DIAGNOSIS — D400 Neoplasm of uncertain behavior of prostate: Secondary | ICD-10-CM | POA: Diagnosis not present

## 2018-12-15 DIAGNOSIS — N5201 Erectile dysfunction due to arterial insufficiency: Secondary | ICD-10-CM | POA: Diagnosis not present

## 2018-12-16 ENCOUNTER — Ambulatory Visit (INDEPENDENT_AMBULATORY_CARE_PROVIDER_SITE_OTHER): Payer: Medicare Other | Admitting: Vascular Surgery

## 2018-12-16 ENCOUNTER — Ambulatory Visit (INDEPENDENT_AMBULATORY_CARE_PROVIDER_SITE_OTHER): Payer: Medicare Other

## 2018-12-16 ENCOUNTER — Other Ambulatory Visit: Payer: Self-pay

## 2018-12-16 ENCOUNTER — Encounter (INDEPENDENT_AMBULATORY_CARE_PROVIDER_SITE_OTHER): Payer: Self-pay | Admitting: Vascular Surgery

## 2018-12-16 VITALS — BP 146/64 | HR 66 | Resp 10 | Ht 67.0 in | Wt 233.0 lb

## 2018-12-16 DIAGNOSIS — I6523 Occlusion and stenosis of bilateral carotid arteries: Secondary | ICD-10-CM | POA: Diagnosis not present

## 2018-12-16 DIAGNOSIS — E785 Hyperlipidemia, unspecified: Secondary | ICD-10-CM

## 2018-12-16 DIAGNOSIS — Z79899 Other long term (current) drug therapy: Secondary | ICD-10-CM | POA: Diagnosis not present

## 2018-12-16 DIAGNOSIS — I480 Paroxysmal atrial fibrillation: Secondary | ICD-10-CM | POA: Diagnosis not present

## 2018-12-16 DIAGNOSIS — Z7901 Long term (current) use of anticoagulants: Secondary | ICD-10-CM | POA: Diagnosis not present

## 2018-12-16 DIAGNOSIS — I10 Essential (primary) hypertension: Secondary | ICD-10-CM

## 2018-12-16 NOTE — Progress Notes (Signed)
MRN : [id number]  Darrell [name] is a 73 y.o. (07/17/1946) male who presents with chief complaint of  Chief Complaint  Patient presents with  . Follow-up  .  History of Present Illness: Patient returns in follow-up of his carotid disease.  He is doing well today without any major issues.  He is managing a lot with the current coronavirus issues and his role is the [place].  He tolerates this quite well and seems calm today.  No focal neurologic symptoms.  Carotid duplex today shows 1 to 39% right ICA stenosis and slight progression of his left carotid artery stenosis now in the 40 to 59% range.  Current Outpatient Medications  Medication Sig Dispense Refill  . allopurinol (ZYLOPRIM) 300 MG tablet Take 0.5 tablets (150 mg total) by mouth daily. 90 tablet 4  . apixaban (ELIQUIS) 5 MG TABS tablet Take 5 mg by mouth 2 (two) times daily.     Marland Kitchen aspirin EC 81 MG tablet Take by mouth.    Marland Kitchen atorvastatin (LIPITOR) 80 MG tablet Take 80 mg by mouth daily.    . carvedilol (COREG) 6.25 MG tablet Take by mouth.    . hydrochlorothiazide (HYDRODIURIL) 25 MG tablet Take 1 tablet (25 mg total) by mouth daily. 90 tablet 4  . telmisartan (MICARDIS) 80 MG tablet Take by mouth.    . pravastatin (PRAVACHOL) 40 MG tablet Take 1 tablet by mouth 1 day or 1 dose.     No current facility-administered medications for this visit.     Past Medical History:  Diagnosis Date  . CAD (coronary artery disease)   . Cancer [facility])    prostate  . Carotid artery stenosis   . Hyperlipidemia   . Hypertension   . Psoriasis     Past Surgical History:  Procedure Laterality Date  . CHOLECYSTECTOMY    . COLONOSCOPY    . COLONOSCOPY WITH PROPOFOL N/A 03/25/2018   Procedure: COLONOSCOPY WITH PROPOFOL;  Surgeon: Jonathon Bellows, MD;  Location: [facility] ENDOSCOPY;  Service: Gastroenterology;  Laterality: N/A;  . CORONARY ANGIOPLASTY WITH STENT PLACEMENT    . CORONARY ARTERY BYPASS GRAFT Right 04/21/2018  . LEFT HEART CATH  AND CORONARY ANGIOGRAPHY N/A 05/21/2017   Procedure: LEFT HEART CATH AND CORONARY ANGIOGRAPHY;  Surgeon: Teodoro Spray, MD;  Location: [facility] CV LAB;  Service: Cardiovascular;  Laterality: N/A;  . LIVER BIOPSY  1999  . SHOULDER SURGERY    . stents            Social History  Substance Use Topics  . Smoking status: Former Smoker    Types: Cigarettes  . Smokeless tobacco: Never Used  . Alcohol use 0.0 oz/week      Family History      Family History  Problem Relation Age of Onset  . Heart attack Mother   . Diabetes Mother   . Stroke Mother   . Heart disease Mother   . Diabetes Brother     No Known Allergies   REVIEW OF SYSTEMS(Negative unless checked)  Constitutional: [] ?Weight loss[] ?Fever[] ?Chills Cardiac:[] ?Chest pain[] ?Chest pressure[] ?Palpitations [] ?Shortness of breath when laying flat [] ?Shortness of breath at rest [] ?Shortness of breath with exertion. Vascular: [] ?Pain in legs with walking[] ?Pain in legsat rest[] ?Pain in legs when laying flat [] ?Claudication [] ?Pain in feet when walking [] ?Pain in feet at rest [] ?Pain in feet when laying flat [] ?History of DVT [] ?Phlebitis [] ?Swelling in legs [] ?Varicose veins [] ?Non-healing ulcers Pulmonary: [] ?Uses home oxygen [] ?Productive cough[] ?Hemoptysis [] ?Wheeze [] ?COPD [] ?Asthma Neurologic: [] ?Dizziness [] ?  Blackouts [] ?Seizures [] ?History of stroke [] ?History of TIA[] ?Aphasia [] ?Temporary blindness[] ?Dysphagia [] ?Weaknessor numbness in arms [] ?Weakness or numbnessin legs Musculoskeletal: [] ?Arthritis [] ?Joint swelling [] ?Joint pain [] ?Low back pain Hematologic:[] ?Easy bruising[] ?Easy bleeding [] ?Hypercoagulable state [] ?Anemic [] ?Hepatitis Gastrointestinal:[] ?Blood in stool[] ?Vomiting blood[] ?Gastroesophageal reflux/heartburn[] ?Difficulty swallowing. Genitourinary: [] ?Chronic kidney disease  [] ?Difficulturination [] ?Frequenturination [] ?Burning with urination[] ?Blood in urine Skin: [] ?Rashes [] ?Ulcers [] ?Wounds Psychological: [] ?History of anxiety[] ?History of major depression.    Physical Examination  Vitals:   12/16/18 0956  BP: (!) 146/64  Pulse: 66  Resp: 10  Weight: 233 lb (105.7 kg)  Height: 5\' 7"  (1.702 m)   Body mass index is 36.49 kg/m. Gen:  WD/WN, NAD Head: [place]/AT, No temporalis wasting. Ear/Nose/Throat: Hearing grossly intact, nares w/o erythema or drainage, trachea midline Eyes: Conjunctiva clear. Sclera non-icteric Neck: Supple.  No bruit  Pulmonary:  Good air movement, equal and clear to auscultation bilaterally.  Cardiac: RRR, No JVD Vascular:  Vessel Right Left  Radial Palpable Palpable               Musculoskeletal: M/S 5/5 throughout.  No deformity or atrophy.  No edema. Neurologic: CN 2-12 intact. Sensation grossly intact in extremities.  Symmetrical.  Speech is fluent. Motor exam as listed above. Psychiatric: Judgment intact, Mood & affect appropriate for pt's clinical situation. Dermatologic: No rashes or ulcers noted.  No cellulitis or open wounds.      CBC Lab Results  Component Value Date   WBC 6.0 04/10/2018   HGB 13.4 04/10/2018   HCT 37.6 (L) 04/10/2018   MCV 93.8 04/10/2018   PLT 127 (L) 04/10/2018    BMET    Component Value Date/Time   NA 144 09/07/2018 1050   NA 135 (L) 09/05/2014 0504   K 4.3 09/07/2018 1050   K 3.8 09/05/2014 0504   CL 104 09/07/2018 1050   CL 98 09/05/2014 0504   CO2 24 09/07/2018 1050   CO2 32 09/05/2014 0504   GLUCOSE 85 09/07/2018 1050   GLUCOSE 131 (H) 04/10/2018 0154   GLUCOSE 108 (H) 09/05/2014 0504   BUN 17 09/07/2018 1050   BUN 26 (H) 09/05/2014 0504   CREATININE 1.20 12/13/2018 1841   CREATININE 1.36 (H) 09/05/2014 0504   CALCIUM 9.3 09/07/2018 1050   CALCIUM 7.8 (L) 09/05/2014 0504   GFRNONAA 62 09/07/2018 1050   GFRNONAA 56 (L) 09/05/2014 0504    GFRNONAA 57 (L) 07/28/2012 0409   GFRAA 71 09/07/2018 1050   GFRAA >60 09/05/2014 0504   GFRAA >60 07/28/2012 0409   Estimated Creatinine Clearance: 65.4 mL/min (by C-G formula based on SCr of 1.2 mg/dL).  COAG Lab Results  Component Value Date   INR 0.9 09/02/2014    Radiology Mr Prostate W Wo Contrast  Result Date: 12/13/2018 CLINICAL DATA:  Prostate carcinoma, with biochemical recurrence. EXAM: MR PROSTATE WITHOUT AND WITH CONTRAST TECHNIQUE: Multiplanar multisequence MRI images were obtained of the pelvis centered about the prostate. Pre and post contrast images were obtained. CONTRAST:  10 mL Gadavist COMPARISON:  None. FINDINGS: Prostate: Expected postop changes are seen from prior prostatectomy. No enhancing mass is identified at the site of the vesicourethral anastomosis, or elsewhere within the prostatectomy bed. The seminal vesicles appear symmetric. Pelvic adenopathy: Absent. Bone metastasis: Absent. Other findings: Sigmoid colon diverticulosis, without evidence of diverticulitis. IMPRESSION: Previous prostatectomy. No locally recurrent carcinoma or pelvic metastatic disease identified. Consider Axumin PET-CT scan for localization of biochemical recurrence. Sigmoid colon diverticulosis, without signs of diverticulitis. Electronically Signed   By: Earle Gell M.D.   On: 12/13/2018  20:01     Assessment/Plan Hyperlipidemia lipid control important in reducing the progression of atherosclerotic disease. Continue statin therapy   Hypertension blood pressure control important in reducing the progression of atherosclerotic disease. On appropriate oral medications.  Paroxysmal A-fib (HCC) On anticoagulation  Carotid stenosis Carotid duplex today shows 1 to 39% right ICA stenosis and slight progression of his left carotid artery stenosis now in the 40 to 59% range. Overall doing well with no changes.  He is on anticoagulation for his atrial fibrillation and will remain on that.   We will continue to follow this on an annual basis.    Leotis Pain, MD  12/16/2018 10:35 AM    This note was created with Dragon medical transcription system.  Any errors from dictation are purely unintentional

## 2018-12-16 NOTE — Assessment & Plan Note (Signed)
On anticoagulation 

## 2018-12-16 NOTE — Assessment & Plan Note (Signed)
Carotid duplex today shows 1 to 39% right ICA stenosis and slight progression of his left carotid artery stenosis now in the 40 to 59% range. Overall doing well with no changes.  He is on anticoagulation for his atrial fibrillation and will remain on that.  We will continue to follow this on an annual basis.

## 2018-12-16 NOTE — Patient Instructions (Signed)
Carotid Artery Disease  The carotid arteries are arteries on both sides of the neck. They carry blood to the brain, face, and neck. Carotid artery disease happens when these arteries become smaller (narrow) or get blocked. If these arteries become smaller or get blocked, you are more likely to have a stroke or a warning stroke (transient ischemic attack). Follow these instructions at home:  Take over-the-counter and prescription medicines only as told by your doctor.  Make sure you understand all instructions about your medicines. Do not stop taking your medicines without talking to your doctor first.  Follow your doctor's diet instructions. It is important to follow a healthy diet. ? Eat foods that include plenty of: ? Fresh fruits. ? Vegetables. ? Lean meats. ? Avoid these foods: ? Foods that are high in fat. ? Foods that are high in salt (sodium). ? Foods that are fried. ? Foods that are processed. ? Foods that have few good nutrients (poor nutritional value).  Keep a healthy weight.  Stay active. Get at least 30 minutes of activity every day.  Do not smoke.  Limit alcohol use to: ? No more than 2 drinks a day for men. ? No more than 1 drink a day for women who are not pregnant.  Do not use illegal drugs.  Keep all follow-up visits as told by your doctor. This is important. Contact a doctor if: Get help right away if:  You have any symptoms of stroke or TIA. The acronym BEFAST is an easy way to remember the main warning signs of stroke. ? B = Balance problems. Signs include dizziness, sudden trouble walking, or loss of balance ? E = Eye problems. This includes trouble seeing or a sudden change in vision. ? F = Face changes. This includes sudden weakness or numbness of the face, or the face or eyelid drooping to one side. ? A = Arm weakness or numbness. This happens suddenly and usually on one side of the body. ? S = Speech problems. This includes trouble speaking or  trouble understanding. ? T = Time. Time to call 911 or seek emergency care. Do not wait to see if symptoms go away. Make note of the time your symptoms started.  Other signs of stroke may include: ? A sudden, severe headache with no known cause. ? Feeling sick to your stomach (nauseous) or throwing up (vomiting). ? Seizure. Call your local emergency services (911 in [place] Do notdrive yourself to the clinic or hospital. Summary  The carotid arteries are arteries on both sides of the neck.  If these arteries get smaller or get blocked, you are more likely to have a stroke or a warning stroke (transient ischemic attack).  Take over-the-counter and prescription medicines only as told by your doctor.  Keep all follow-up visits as told by your doctor. This is important. This information is not intended to replace advice given to you by your health care provider. Make sure you discuss any questions you have with your health care provider. Document Released: 07/20/2012 Document Revised: 07/29/2017 Document Reviewed: 07/29/2017 Elsevier Interactive Patient Education  2019 [employer]vier Inc[employer]  

## 2018-12-20 ENCOUNTER — Ambulatory Visit: Payer: Self-pay | Admitting: *Deleted

## 2018-12-20 ENCOUNTER — Telehealth: Payer: Self-pay

## 2018-12-20 DIAGNOSIS — E785 Hyperlipidemia, unspecified: Secondary | ICD-10-CM

## 2018-12-20 DIAGNOSIS — I10 Essential (primary) hypertension: Secondary | ICD-10-CM

## 2018-12-20 NOTE — Chronic Care Management (AMB) (Signed)
  Chronic Care Management   Outreach Note  12/20/2018 Name: Darrell [name] MRN: [id number] DOB: 1945-12-15  Referred by: Guadalupe Maple, MD Reason for referral : Chronic Care Management (HTN/Hyperlipidemia)   An unsuccessful telephone outreach was attempted today. The patient was referred to the case management team by for assistance with chronic care management and care coordination.   Follow Up Plan: A HIPPA compliant phone message was left for the patient providing contact information and requesting a return call.  The CM team will reach out to the patient again over the next 7 days.   Merlene Morse Brittinie Wherley RN, BSN Nurse Case Editor, [facility] Care Management  [phone]) Business Mobile

## 2018-12-27 ENCOUNTER — Encounter
Admission: RE | Admit: 2018-12-27 | Discharge: 2018-12-27 | Disposition: A | Discharge status: Home / Self Care | Payer: Medicare Other | Source: Ambulatory Visit | Attending: Urology | Admitting: Urology

## 2018-12-27 ENCOUNTER — Other Ambulatory Visit: Payer: Self-pay

## 2018-12-27 DIAGNOSIS — C61 Malignant neoplasm of prostate: Secondary | ICD-10-CM | POA: Diagnosis not present

## 2018-12-27 DIAGNOSIS — R972 Elevated prostate specific antigen [PSA]: Secondary | ICD-10-CM | POA: Insufficient documentation

## 2018-12-27 MED ORDER — AXUMIN (FLUCICLOVINE F 18) INJECTION
9.8100 | Freq: Once | INTRAVENOUS | Status: AC | PRN
  Administered 2018-12-27: 9.81 via INTRAVENOUS

## 2018-12-28 ENCOUNTER — Telehealth: Payer: Self-pay

## 2018-12-30 DIAGNOSIS — D400 Neoplasm of uncertain behavior of prostate: Secondary | ICD-10-CM | POA: Diagnosis not present

## 2018-12-30 DIAGNOSIS — R972 Elevated prostate specific antigen [PSA]: Secondary | ICD-10-CM | POA: Diagnosis not present

## 2018-12-30 DIAGNOSIS — C61 Malignant neoplasm of prostate: Secondary | ICD-10-CM | POA: Diagnosis not present

## 2018-12-30 DIAGNOSIS — N5201 Erectile dysfunction due to arterial insufficiency: Secondary | ICD-10-CM | POA: Diagnosis not present

## 2019-01-03 ENCOUNTER — Telehealth: Payer: Self-pay

## 2019-01-11 ENCOUNTER — Telehealth: Payer: Self-pay

## 2019-01-24 ENCOUNTER — Telehealth: Payer: Self-pay

## 2019-01-24 DIAGNOSIS — I10 Essential (primary) hypertension: Secondary | ICD-10-CM | POA: Diagnosis not present

## 2019-01-24 DIAGNOSIS — I25810 Atherosclerosis of coronary artery bypass graft(s) without angina pectoris: Secondary | ICD-10-CM | POA: Diagnosis not present

## 2019-01-24 DIAGNOSIS — E782 Mixed hyperlipidemia: Secondary | ICD-10-CM | POA: Diagnosis not present

## 2019-01-24 DIAGNOSIS — I255 Ischemic cardiomyopathy: Secondary | ICD-10-CM | POA: Diagnosis not present

## 2019-02-01 ENCOUNTER — Telehealth: Payer: Self-pay

## 2019-02-07 DIAGNOSIS — E7801 Familial hypercholesterolemia: Secondary | ICD-10-CM | POA: Diagnosis not present

## 2019-02-07 DIAGNOSIS — R0602 Shortness of breath: Secondary | ICD-10-CM | POA: Diagnosis not present

## 2019-02-07 DIAGNOSIS — I499 Cardiac arrhythmia, unspecified: Secondary | ICD-10-CM | POA: Diagnosis not present

## 2019-02-07 DIAGNOSIS — I498 Other specified cardiac arrhythmias: Secondary | ICD-10-CM | POA: Diagnosis not present

## 2019-02-10 ENCOUNTER — Ambulatory Visit: Payer: Medicare Other

## 2019-02-10 ENCOUNTER — Ambulatory Visit: Payer: Medicare Other | Admitting: Hematology and Oncology

## 2019-02-20 ENCOUNTER — Other Ambulatory Visit: Payer: Self-pay

## 2019-02-20 ENCOUNTER — Encounter: Payer: Self-pay | Admitting: Nurse Practitioner

## 2019-02-20 ENCOUNTER — Ambulatory Visit (INDEPENDENT_AMBULATORY_CARE_PROVIDER_SITE_OTHER): Payer: Medicare Other | Admitting: Nurse Practitioner

## 2019-02-20 ENCOUNTER — Encounter: Payer: Medicare Other | Admitting: Family Medicine

## 2019-02-20 VITALS — BP 131/84 | HR 55 | Temp 98.0°F | Ht 67.2 in | Wt 232.2 lb

## 2019-02-20 DIAGNOSIS — Z0000 Encounter for general adult medical examination without abnormal findings: Secondary | ICD-10-CM | POA: Diagnosis not present

## 2019-02-20 DIAGNOSIS — E785 Hyperlipidemia, unspecified: Secondary | ICD-10-CM | POA: Diagnosis not present

## 2019-02-20 DIAGNOSIS — I10 Essential (primary) hypertension: Secondary | ICD-10-CM | POA: Diagnosis not present

## 2019-02-20 DIAGNOSIS — L409 Psoriasis, unspecified: Secondary | ICD-10-CM

## 2019-02-20 DIAGNOSIS — I480 Paroxysmal atrial fibrillation: Secondary | ICD-10-CM | POA: Diagnosis not present

## 2019-02-20 DIAGNOSIS — M109 Gout, unspecified: Secondary | ICD-10-CM

## 2019-02-20 DIAGNOSIS — I6523 Occlusion and stenosis of bilateral carotid arteries: Secondary | ICD-10-CM

## 2019-02-20 NOTE — Assessment & Plan Note (Signed)
Chronic, ongoing with BP at goal today and on home BPs.  Continue current medication regimen and collaboration with cardiology.Darrell [name]

## 2019-02-20 NOTE — Assessment & Plan Note (Signed)
Chronic, ongoing.  Continue current medication regimen and cardiology collaboration.

## 2019-02-20 NOTE — Assessment & Plan Note (Signed)
Continue collaboration with dermatology. °

## 2019-02-20 NOTE — Progress Notes (Signed)
BP 131/84   Pulse (!) 55   Temp 98 F (36.7 C) (Oral)   Ht 5' 7.2" (1.707 m)   Wt 232 lb 3.2 oz (105.3 kg)   SpO2 98%   BMI 36.15 kg/m    Subjective:    Patient ID: Darrell [name], male    DOB: 1945/10/07, 73 y.o.   MRN: [id number]  HPI: Darrell [name] is a 73 y.o. male presenting on 02/20/2019 for comprehensive medical examination. Current medical complaints include:none  He currently lives with: wife Interim Problems from his last visit: no   HYPERTENSION / HYPERLIPIDEMIA Currently continues on Atorvastatin 80 MG and HCTZ + Telmisartan + Coreg. Satisfied with current treatment? yes Duration of hypertension: chronic BP monitoring frequency: daily BP range: 120-140/80's BP medication side effects: no Duration of hyperlipidemia: chronic Cholesterol medication side effects: no Cholesterol supplements: none Medication compliance: good compliance Aspirin: yes Recent stressors: no Recurrent headaches: no Visual changes: no Palpitations: no Dyspnea: no Chest pain: no Lower extremity edema: no Dizzy/lightheaded: no   ATRIAL FIBRILLATION Currently on Apixaban 5 MG BID and Coreg 6.25MG  daily. Saw Dr. [name] 01/24/19. Atrial fibrillation status: controlled Satisfied with current treatment: yes  Medication side effects:  no Medication compliance: good compliance Etiology of atrial fibrillation:  Palpitations:  no Chest pain:  no Dyspnea on exertion:  no Orthopnea:  no Syncope:  no Edema:  yes, slight per patient but no change from baseline Ventricular rate control: B-blocker Anti-coagulation: long acting   GOUT Takes Allopurinol 150 MG daily. Duration:chronic, no flares in 8-9 years Swelling: no Redness: no Trauma: no Recent dietary change or indiscretion: no Fevers: no Nausea/vomiting: no Aggravating factors: Alleviating factors:  Status:  better Treatments attempted: Allopurinol  Functional Status Survey: Is the patient deaf or have difficulty  hearing?: No Does the patient have difficulty seeing, even when wearing glasses/contacts?: No Does the patient have difficulty concentrating, remembering, or making decisions?: No Does the patient have difficulty walking or climbing stairs?: No Does the patient have difficulty dressing or bathing?: No Does the patient have difficulty doing errands alone such as visiting a doctor's office or shopping?: No  FALL RISK: Fall Risk  02/20/2019 06/09/2018 06/06/2018 02/16/2018 01/24/2018  Falls in the past year? 0 No No No No  Number falls in past yr: 0 - - - -  Injury with Fall? 0 - - - -  Follow up Falls evaluation completed - - - -    Depression Screen Depression screen [facility] 2/9 02/20/2019 09/20/2018 06/30/2018 06/09/2018 06/06/2018  Decreased Interest 0 1 0 1 1  Down, Depressed, Hopeless 0 1 1 1 1   PHQ - 2 Score 0 2 1 2 2   Altered sleeping 0 3 3 1 3   Tired, decreased energy 0 1 1 1 1   Change in appetite 0 3 2 0 0  Feeling bad or failure about yourself  0 0 0 1 2  Trouble concentrating 0 0 0 0 0  Moving slowly or fidgety/restless 0 0 0 0 0  Suicidal thoughts 0 0 0 0 0  PHQ-9 Score 0 9 7 5 8   Difficult doing work/chores Not difficult at all Not difficult at all Somewhat difficult - Somewhat difficult  Some recent data might be hidden    Advanced Directives <no information>  Past Medical History:  Past Medical History:  Diagnosis Date  . CAD (coronary artery disease)   . Cancer [facility])    prostate  . Carotid artery stenosis   . Hyperlipidemia   .  Hypertension   . Psoriasis     Surgical History:  Past Surgical History:  Procedure Laterality Date  . CHOLECYSTECTOMY    . COLONOSCOPY    . COLONOSCOPY WITH PROPOFOL N/A 03/25/2018   Procedure: COLONOSCOPY WITH PROPOFOL;  Surgeon: Jonathon Bellows, MD;  Location: [facility] ENDOSCOPY;  Service: Gastroenterology;  Laterality: N/A;  . CORONARY ANGIOPLASTY WITH STENT PLACEMENT    . CORONARY ARTERY BYPASS GRAFT Right 04/21/2018  . LEFT HEART CATH AND  CORONARY ANGIOGRAPHY N/A 05/21/2017   Procedure: LEFT HEART CATH AND CORONARY ANGIOGRAPHY;  Surgeon: Teodoro Spray, MD;  Location: [place] CV LAB;  Service: Cardiovascular;  Laterality: N/A;  . LIVER BIOPSY  1999  . SHOULDER SURGERY    . stents      Medications:  Current Outpatient Medications on File Prior to Visit  Medication Sig  . allopurinol (ZYLOPRIM) 300 MG tablet Take 0.5 tablets (150 mg total) by mouth daily.  Marland Kitchen apixaban (ELIQUIS) 5 MG TABS tablet Take 5 mg by mouth 2 (two) times daily.   Marland Kitchen aspirin EC 81 MG tablet Take by mouth.  Marland Kitchen atorvastatin (LIPITOR) 80 MG tablet Take 80 mg by mouth daily.  . carvedilol (COREG) 6.25 MG tablet Take by mouth.  . hydrochlorothiazide (HYDRODIURIL) 25 MG tablet Take 1 tablet (25 mg total) by mouth daily.  Marland Kitchen telmisartan (MICARDIS) 80 MG tablet Take by mouth.   No current facility-administered medications on file prior to visit.     Allergies:  No Known Allergies  Social History:  Social History   Socioeconomic History  . Marital status: Married    Spouse name: Not on file  . Number of children: Not on file  . Years of education: 94  . Highest education level: Some college, no degree  Occupational History  . Occupation: retired    Comment: semi-retired [employer]  Social Needs  . Financial resource strain: Not hard at all  . Food insecurity    Worry: Never true    Inability: Never true  . Transportation needs    Medical: No    Non-medical: No  Tobacco Use  . Smoking status: Former Smoker    Types: Cigarettes    Quit date: 1989    Years since quitting: 31.5  . Smokeless tobacco: Never Used  . Tobacco comment: quit 30 years ago   Substance and Sexual Activity  . Alcohol use: Yes    Alcohol/week: 1.0 standard drinks    Types: 1 Cans of beer per week    Comment: occasioanlly   . Drug use: No  . Sexual activity: Not on file  Lifestyle  . Physical activity    Days per week: 4 days    Minutes per session: 70 min   . Stress: Patient refused  Relationships  . Social connections    Talks on phone: More than three times a week    Gets together: More than three times a week    Attends religious service: More than 4 times per year    Active member of club or organization: Yes    Attends meetings of clubs or organizations: More than 4 times per year    Relationship status: Married  . Intimate partner violence    Fear of current or ex partner: No    Emotionally abused: No    Physically abused: No    Forced sexual activity: No  Other Topics Concern  . Not on file  Social History Narrative  . Not on file  Social History   Tobacco Use  Smoking Status Former Smoker  . Types: Cigarettes  . Quit date: 11  . Years since quitting: 31.5  Smokeless Tobacco Never Used  Tobacco Comment   quit 30 years ago    Social History   Substance and Sexual Activity  Alcohol Use Yes  . Alcohol/week: 1.0 standard drinks  . Types: 1 Cans of beer per week   Comment: occasioanlly     Family History:  Family History  Problem Relation Age of Onset  . Heart attack Mother   . Diabetes Mother   . Stroke Mother   . Heart disease Mother   . Diabetes Brother   . Heart Problems Brother   . Diabetes Sister   . Lung cancer Maternal Aunt   . Esophageal cancer Sister     Past medical history, surgical history, medications, allergies, family history and social history reviewed with patient today and changes made to appropriate areas of the chart.   Review of Systems - negative All other ROS negative except what is listed above and in the HPI.      Objective:    BP 131/84   Pulse (!) 55   Temp 98 F (36.7 C) (Oral)   Ht 5' 7.2" (1.707 m)   Wt 232 lb 3.2 oz (105.3 kg)   SpO2 98%   BMI 36.15 kg/m   Wt Readings from Last 3 Encounters:  02/20/19 232 lb 3.2 oz (105.3 kg)  12/16/18 233 lb (105.7 kg)  09/13/18 226 lb (102.5 kg)    Physical Exam Vitals signs and nursing note reviewed.  Constitutional:       General: He is awake. He is not in acute distress.    Appearance: He is well-developed. He is obese. He is not ill-appearing.  HENT:     Head: Normocephalic and atraumatic.     Right Ear: Hearing, tympanic membrane, ear canal and external ear normal. No drainage.     Left Ear: Hearing, tympanic membrane, ear canal and external ear normal. No drainage.     Nose: Nose normal.     Mouth/Throat:     Pharynx: Oropharynx is clear. Uvula midline.  Eyes:     General: Lids are normal.        Right eye: No discharge.        Left eye: No discharge.     Conjunctiva/sclera: Conjunctivae normal.     Pupils: Pupils are equal, round, and reactive to light.  Neck:     Musculoskeletal: Normal range of motion and neck supple.     Thyroid: No thyromegaly.     Vascular: No carotid bruit or JVD.     Trachea: Trachea normal.  Cardiovascular:     Rate and Rhythm: Normal rate and regular rhythm.     Heart sounds: Normal heart sounds, S1 normal and S2 normal. No murmur. No gallop.   Pulmonary:     Effort: Pulmonary effort is normal. No accessory muscle usage or respiratory distress.     Breath sounds: Normal breath sounds.  Abdominal:     General: Bowel sounds are normal.     Palpations: Abdomen is soft. There is no hepatomegaly or splenomegaly.     Tenderness: There is no abdominal tenderness.  Genitourinary:    Comments: Deferred, recently done at urologist. Musculoskeletal: Normal range of motion.     Right lower leg: Edema (trace) present.     Left lower leg: Edema (trace) present.  Lymphadenopathy:  Cervical: No cervical adenopathy.  Skin:    General: Skin is warm and dry.     Capillary Refill: Capillary refill takes less than 2 seconds.     Findings: No rash.  Neurological:     Mental Status: He is alert and oriented to person, place, and time.     Cranial Nerves: Cranial nerves are intact.     Coordination: Coordination is intact.     Gait: Gait is intact.     Deep Tendon Reflexes:  Reflexes are normal and symmetric.  Psychiatric:        Attention and Perception: Attention normal.        Mood and Affect: Mood normal.        Speech: Speech normal.        Behavior: Behavior normal. Behavior is cooperative.        Thought Content: Thought content normal.        Cognition and Memory: Cognition normal.        Judgment: Judgment normal.     Cognitive Testing - 6-CIT  Correct? Score   What year is it? yes 4 Yes = 0    No = 4  What month is it? yes 4 Yes = 0    No = 3  Remember:     Pia Mau, [address], [place]     What time is it? yes 4 Yes = 0    No = 3  Count backwards from 20 to 1 yes 4 Correct = 0    1 error = 2   More than 1 error = 4  Say the months of the year in reverse. yes 4 Correct = 0    1 error = 2   More than 1 error = 4  What address did I ask you to remember? yes 10 Correct = 0  1 error = 2    2 error = 4    3 error = 6    4 error = 8    All wrong = 10       TOTAL SCORE  30/28   Interpretation:  Normal  Normal (0-7) Abnormal (8-28)    Results for orders placed or performed during the hospital encounter of 12/13/18  I-STAT creatinine  Result Value Ref Range   Creatinine, Ser 1.20 0.61 - 1.24 mg/dL      Assessment & Plan:   Problem List Items Addressed This Visit      Cardiovascular and Mediastinum   Hypertension    Chronic, ongoing with BP at goal today and on home BPs.  Continue current medication regimen and collaboration with cardiology..        Relevant Orders   CBC with Differential/Platelet   Comprehensive metabolic panel   Paroxysmal A-fib (HCC)    Chronic, ongoing.  Continue current medication regimen and cardiology collaboration.          Musculoskeletal and Integument   Psoriasis    Continue collaboration with dermatology.        Other   Hyperlipidemia    Chronic, ongoing.  Continue current medication regimen and adjust as needed.  Labs today.        Relevant Orders   Comprehensive metabolic panel   Lipid  Panel w/o Chol/HDL Ratio   Gout    Chronic, stable.  Continue current medication regimen, labs today.        Relevant Orders   Uric acid    Other Visit Diagnoses  Encounter for annual physical exam    -  Primary   Relevant Orders   TSH       Discussed aspirin prophylaxis for myocardial infarction prevention and decision was made to continue ASA  LABORATORY TESTING:  Health maintenance labs ordered today as discussed above.   The natural history of prostate cancer and ongoing controversy regarding screening and potential treatment outcomes of prostate cancer has been discussed with the patient. The meaning of a false positive PSA and a false negative PSA has been discussed. He indicates understanding of the limitations of this screening test and wishes to proceed with screening PSA testing.  Recently had PSA checked with urology, Dr. Eliberto Ivory.     IMMUNIZATIONS:   - Tdap: Tetanus vaccination status reviewed: last tetanus booster within 10 years. - Influenza: Up to date - Pneumovax: Up to date - Prevnar: Up to date - Zostavax vaccine: Refused  SCREENING: - Colonoscopy: Up to date  Discussed with patient purpose of the colonoscopy is to detect colon cancer at curable precancerous or early stages   - AAA Screening: Not applicable  -Hearing Test: Not applicable  -Spirometry: Not applicable   PATIENT COUNSELING:    Sexuality: Discussed sexually transmitted diseases, partner selection, use of condoms, avoidance of unintended pregnancy  and contraceptive alternatives.   Advised to avoid cigarette smoking.  I discussed with the patient that most people either abstain from alcohol or drink within safe limits (<=14/week and <=4 drinks/occasion for males, <=7/weeks and <= 3 drinks/occasion for females) and that the risk for alcohol disorders and other health effects rises proportionally with the number of drinks per week and how often a drinker exceeds daily limits.  Discussed  cessation/primary prevention of drug use and availability of treatment for abuse.   Diet: Encouraged to adjust caloric intake to maintain  or achieve ideal body weight, to reduce intake of dietary saturated fat and total fat, to limit sodium intake by avoiding high sodium foods and not adding table salt, and to maintain adequate dietary potassium and calcium preferably from fresh fruits, vegetables, and low-fat dairy products.    stressed the importance of regular exercise  Injury prevention: Discussed safety belts, safety helmets, smoke detector, smoking near bedding or upholstery.   Dental health: Discussed importance of regular tooth brushing, flossing, and dental visits.   Follow up plan: NEXT PREVENTATIVE PHYSICAL DUE IN 1 YEAR. Return in about 6 months (around 08/23/2019) for Follow-up.

## 2019-02-20 NOTE — Assessment & Plan Note (Signed)
Chronic, ongoing.  Continue current medication regimen and adjust as needed.  Labs today. 

## 2019-02-20 NOTE — Patient Instructions (Signed)
Atrial Fibrillation ° °Atrial fibrillation is a type of heartbeat that is irregular or fast (rapid). If you have this condition, your heart beats without any order. This makes it hard for your heart to pump blood in a normal way. Having this condition gives you more risk for stroke, heart failure, and other heart problems. °Atrial fibrillation may start all of a sudden and then stop on its own, or it may become a long-lasting problem. °What are the causes? °This condition may be caused by heart conditions, such as: °· High blood pressure. °· Heart failure. °· Heart valve disease. °· Heart surgery. °Other causes include: °· Pneumonia. °· Obstructive sleep apnea. °· Lung cancer. °· Thyroid disease. °· Drinking too much alcohol. °Sometimes the cause is not known. °What increases the risk? °You are more likely to develop this condition if: °· You smoke. °· You are older. °· You have diabetes. °· You are overweight. °· You have a family history of this condition. °· You exercise often and hard. °What are the signs or symptoms? °Common symptoms of this condition include: °· A feeling like your heart is beating very fast. °· Chest pain. °· Feeling short of breath. °· Feeling light-headed or weak. °· Getting tired easily. °Follow these instructions at home: °Medicines °· Take over-the-counter and prescription medicines only as told by your doctor. °· If your doctor gives you a blood-thinning medicine, take it exactly as told. Taking too much of it can cause bleeding. Taking too little of it does not protect you against clots. Clots can cause a stroke. °Lifestyle ° °  ° °· Do not use any tobacco products. These include cigarettes, chewing tobacco, and e-cigarettes. If you need help quitting, ask your doctor. °· Do not drink alcohol. °· Do not drink beverages that have caffeine. These include coffee, soda, and tea. °· Follow diet instructions as told by your doctor. °· Exercise regularly as told by your doctor. °General  instructions °· If you have a condition that causes breathing to stop for a short period of time (apnea), treat it as told by your doctor. °· Keep a healthy weight. Do not use diet pills unless your doctor says they are safe for you. Diet pills may make heart problems worse. °· Keep all follow-up visits as told by your doctor. This is important. °Contact a doctor if: °· You notice a change in the speed, rhythm, or strength of your heartbeat. °· You are taking a blood-thinning medicine and you see more bruising. °· You get tired more easily when you move or exercise. °· You have a sudden change in weight. °Get help right away if: ° °· You have pain in your chest or your belly (abdomen). °· You have trouble breathing. °· You have blood in your vomit, poop, or pee (urine). °· You have any signs of a stroke. "BE FAST" is an easy way to remember the main warning signs: °? B - Balance. Signs are dizziness, sudden trouble walking, or loss of balance. °? E - Eyes. Signs are trouble seeing or a change in how you see. °? F - Face. Signs are sudden weakness or loss of feeling in the face, or the face or eyelid drooping on one side. °? A - Arms. Signs are weakness or loss of feeling in an arm. This happens suddenly and usually on one side of the body. °? S - Speech. Signs are sudden trouble speaking, slurred speech, or trouble understanding what people say. °? T - Time.   Time to call emergency services. Write down what time symptoms started. °· You have other signs of a stroke, such as: °? A sudden, very bad headache with no known cause. °? Feeling sick to your stomach (nausea). °? Throwing up (vomiting). °? Jerky movements you cannot control (seizure). °These symptoms may be an emergency. Do not wait to see if the symptoms will go away. Get medical help right away. Call your local emergency services (911 in the [place] Do not drive yourself to the hospital. °Summary °· Atrial fibrillation is a type of heartbeat that is irregular  or fast (rapid). °· You are at higher risk of this condition if you smoke, are older, have diabetes, or are overweight. °· Follow your doctor's instructions about medicines, diet, exercise, and follow-up visits. °· Get help right away if you think that you have signs of a stroke. °This information is not intended to replace advice given to you by your health care provider. Make sure you discuss any questions you have with your health care provider. °Document Released: 05/12/2008 Document Revised: 10/07/2017 Document Reviewed: 09/24/2017 °Elsevier Patient Education © 2020 [employer] ° °

## 2019-02-20 NOTE — Assessment & Plan Note (Signed)
Chronic, stable.  Continue current medication regimen, labs today.

## 2019-02-21 ENCOUNTER — Ambulatory Visit: Payer: Medicare Other

## 2019-02-21 ENCOUNTER — Ambulatory Visit: Payer: Medicare Other | Admitting: Hematology and Oncology

## 2019-02-21 LAB — COMPREHENSIVE METABOLIC PANEL
ALT: 25 IU/L (ref 0–44)
AST: 27 IU/L (ref 0–40)
Albumin/Globulin Ratio: 1.8 (ref 1.2–2.2)
Albumin: 4 g/dL (ref 3.7–4.7)
Alkaline Phosphatase: 120 IU/L — ABNORMAL HIGH (ref 39–117)
BUN/Creatinine Ratio: 16 (ref 10–24)
BUN: 19 mg/dL (ref 8–27)
Bilirubin Total: 0.6 mg/dL (ref 0.0–1.2)
CO2: 22 mmol/L (ref 20–29)
Calcium: 9.2 mg/dL (ref 8.6–10.2)
Chloride: 108 mmol/L — ABNORMAL HIGH (ref 96–106)
Creatinine, Ser: 1.17 mg/dL (ref 0.76–1.27)
GFR calc Af Amer: 72 mL/min/{1.73_m2} (ref >59)
GFR calc non Af Amer: 62 mL/min/{1.73_m2} (ref >59)
Globulin, Total: 2.2 g/dL (ref 1.5–4.5)
Glucose: 147 mg/dL — ABNORMAL HIGH (ref 65–99)
Potassium: 4.3 mmol/L (ref 3.5–5.2)
Sodium: 142 mmol/L (ref 134–144)
Total Protein: 6.2 g/dL (ref 6.0–8.5)

## 2019-02-21 LAB — CBC WITH DIFFERENTIAL/PLATELET
Basophils Absolute: 0 10*3/uL (ref 0.0–0.2)
Basos: 0 %
EOS (ABSOLUTE): 0.2 10*3/uL (ref 0.0–0.4)
Eos: 3 %
Hematocrit: 39.6 % (ref 37.5–51.0)
Hemoglobin: 13.3 g/dL (ref 13.0–17.7)
Immature Grans (Abs): 0 10*3/uL (ref 0.0–0.1)
Immature Granulocytes: 0 %
Lymphocytes Absolute: 1 10*3/uL (ref 0.7–3.1)
Lymphs: 22 %
MCH: 32.2 pg (ref 26.6–33.0)
MCHC: 33.6 g/dL (ref 31.5–35.7)
MCV: 96 fL (ref 79–97)
Monocytes Absolute: 0.4 10*3/uL (ref 0.1–0.9)
Monocytes: 8 %
Neutrophils Absolute: 3 10*3/uL (ref 1.4–7.0)
Neutrophils: 67 %
Platelets: 127 10*3/uL — ABNORMAL LOW (ref 150–450)
RBC: 4.13 x10E6/uL — ABNORMAL LOW (ref 4.14–5.80)
RDW: 12.6 % (ref 11.6–15.4)
WBC: 4.6 10*3/uL (ref 3.4–10.8)

## 2019-02-21 LAB — LIPID PANEL W/O CHOL/HDL RATIO
Cholesterol, Total: 121 mg/dL (ref 100–199)
HDL: 45 mg/dL (ref >39)
LDL Calculated: 51 mg/dL (ref 0–99)
Triglycerides: 123 mg/dL (ref 0–149)
VLDL Cholesterol Cal: 25 mg/dL (ref 5–40)

## 2019-02-21 LAB — URIC ACID: Uric Acid: 6.4 mg/dL (ref 3.7–8.6)

## 2019-02-21 LAB — TSH: TSH: 3.14 u[IU]/mL (ref 0.450–4.500)

## 2019-02-22 ENCOUNTER — Telehealth: Payer: Self-pay

## 2019-03-15 ENCOUNTER — Telehealth: Payer: Self-pay

## 2019-03-16 DIAGNOSIS — D485 Neoplasm of uncertain behavior of skin: Secondary | ICD-10-CM | POA: Diagnosis not present

## 2019-03-16 DIAGNOSIS — Z85828 Personal history of other malignant neoplasm of skin: Secondary | ICD-10-CM | POA: Diagnosis not present

## 2019-03-16 DIAGNOSIS — Z79899 Other long term (current) drug therapy: Secondary | ICD-10-CM | POA: Diagnosis not present

## 2019-03-16 DIAGNOSIS — X32XXXA Exposure to sunlight, initial encounter: Secondary | ICD-10-CM | POA: Diagnosis not present

## 2019-03-16 DIAGNOSIS — C44519 Basal cell carcinoma of skin of other part of trunk: Secondary | ICD-10-CM | POA: Diagnosis not present

## 2019-03-16 DIAGNOSIS — L538 Other specified erythematous conditions: Secondary | ICD-10-CM | POA: Diagnosis not present

## 2019-03-16 DIAGNOSIS — L570 Actinic keratosis: Secondary | ICD-10-CM | POA: Diagnosis not present

## 2019-03-16 DIAGNOSIS — B354 Tinea corporis: Secondary | ICD-10-CM | POA: Diagnosis not present

## 2019-03-16 DIAGNOSIS — Z08 Encounter for follow-up examination after completed treatment for malignant neoplasm: Secondary | ICD-10-CM | POA: Diagnosis not present

## 2019-03-16 DIAGNOSIS — L400 Psoriasis vulgaris: Secondary | ICD-10-CM | POA: Diagnosis not present

## 2019-03-16 DIAGNOSIS — L820 Inflamed seborrheic keratosis: Secondary | ICD-10-CM | POA: Diagnosis not present

## 2019-03-28 ENCOUNTER — Ambulatory Visit (INDEPENDENT_AMBULATORY_CARE_PROVIDER_SITE_OTHER): Payer: Medicare Other | Admitting: *Deleted

## 2019-03-28 DIAGNOSIS — G4733 Obstructive sleep apnea (adult) (pediatric): Secondary | ICD-10-CM

## 2019-03-28 DIAGNOSIS — I480 Paroxysmal atrial fibrillation: Secondary | ICD-10-CM | POA: Diagnosis not present

## 2019-03-28 NOTE — Chronic Care Management (AMB) (Signed)
  Chronic Care [name]   Follow Up Note   03/28/2019 Name: Darrell [name] MRN: [id number] DOB: 03-11-1946  Referred by: Darrell Maple, MD Reason for referral : No chief complaint on file.   Darrell [name] is a 73 y.o. year old male who is a primary care patient of Crissman, Jeannette How, MD. The CCM team was consulted for assistance with chronic disease [name] and care coordination needs.    Review of patient status, including review of consultants reports, relevant laboratory and other test results, and collaboration with appropriate care team members and the patient's provider was performed as part of comprehensive patient evaluation and provision of chronic care [name] services.    Goals Addressed            This Visit's Progress   . COMPLETED: "I didn't get to do my sleep study" (pt-stated)       Current Barriers:  Marland Kitchen Knowledge Deficits related to follow up on sleep study plans  -  Darrell [name] was referred for a sleep center evaluation prior to the onset and restrictions related to COVID-19. He states his appointment has not been scheduled but he does wish to have the sleep study when available.   Nurse Case Manager Clinical Goal(s):  Marland Kitchen Over the next 30 days, patient will verbalize understanding of plan for sleep study follow up -Patient does not want to pursue this at this time. He was unable to get used to the mask and was unable to get the percent of sleep required to have insurance pay for the machine.  Interventions:  . Evaluation of current treatment plan related to sleep study eval/needs and patient's adherence to plan as established by provider. . Advised patient to follow up with sleep center when restrictions are removed . Discussed plans with patient for ongoing care [name] follow up and provided patient with direct contact information for care [name] team  Patient Self Care Activities:  . Self administers medications as prescribed . Attends all  scheduled provider appointments . Performs ADL's independently . Performs IADL's independently . Calls provider office for new concerns or questions  Please see past updates related to this goal by clicking on the "Past Updates" button in the selected goal          The patient has been provided with contact information for the care [name] team and has been advised to call with any health related questions or concerns.  No further follow up required: patient with no further ccm needs at this time   Darrell [name] Darrell [name]  [phone]) [employer]

## 2019-03-29 NOTE — Patient Instructions (Signed)
Thank you allowing the Chronic Care Management Team to be a part of your care! It was a pleasure speaking with you today!  CCM (Chronic Care Management) Team   Treylen Gibbs RN, BSN Nurse Care Coordinator  [phone]  [facility] PharmD  Clinical Pharmacist  [phone]  Eula Fried LCSW Clinical Social Worker [phone]  Goals Addressed            This Visit's Progress   . COMPLETED: "I didn't get to do my sleep study" (pt-stated)       Current Barriers:  Marland Kitchen Knowledge Deficits related to follow up on sleep study plans  -  Mr. Schertzer was referred for a sleep center evaluation prior to the onset and restrictions related to COVID-19. He states his appointment has not been scheduled but he does wish to have the sleep study when available.   Nurse Case Manager Clinical Goal(s):  Marland Kitchen Over the next 30 days, patient will verbalize understanding of plan for sleep study follow up -Patient does not want to pursue this at this time. He was unable to get used to the mask and was unable to get the percent of sleep required to have insurance pay for the machine.  Interventions:  . Evaluation of current treatment plan related to sleep study eval/needs and patient's adherence to plan as established by provider. . Advised patient to follow up with sleep center when restrictions are removed . Discussed plans with patient for ongoing care management follow up and provided patient with direct contact information for care management team  Patient Self Care Activities:  . Self administers medications as prescribed . Attends all scheduled provider appointments . Performs ADL's independently . Performs IADL's independently . Calls provider office for new concerns or questions  Please see past updates related to this goal by clicking on the "Past Updates" button in the selected goal         The patient verbalized understanding of instructions provided today and declined a print copy of  patient instruction materials.   The patient has been provided with contact information for the care management team and has been advised to call with any health related questions or concerns.

## 2019-04-07 DIAGNOSIS — H35033 Hypertensive retinopathy, bilateral: Secondary | ICD-10-CM | POA: Diagnosis not present

## 2019-04-07 DIAGNOSIS — H2513 Age-related nuclear cataract, bilateral: Secondary | ICD-10-CM | POA: Diagnosis not present

## 2019-04-18 DIAGNOSIS — L400 Psoriasis vulgaris: Secondary | ICD-10-CM | POA: Diagnosis not present

## 2019-05-01 DIAGNOSIS — C44519 Basal cell carcinoma of skin of other part of trunk: Secondary | ICD-10-CM | POA: Diagnosis not present

## 2019-05-06 IMAGING — CR DG CHEST 2V
1 series · 2 of 2 positions shown · non-contrast
Comparison: 05/20/2017

CLINICAL DATA: Shortness of breath and chest pain

EXAM:
CHEST - 2 VIEW

[Series 1: dg chest 2 view · 0.14mm/px · 2 of 2 slices shown]
[im 1/2]
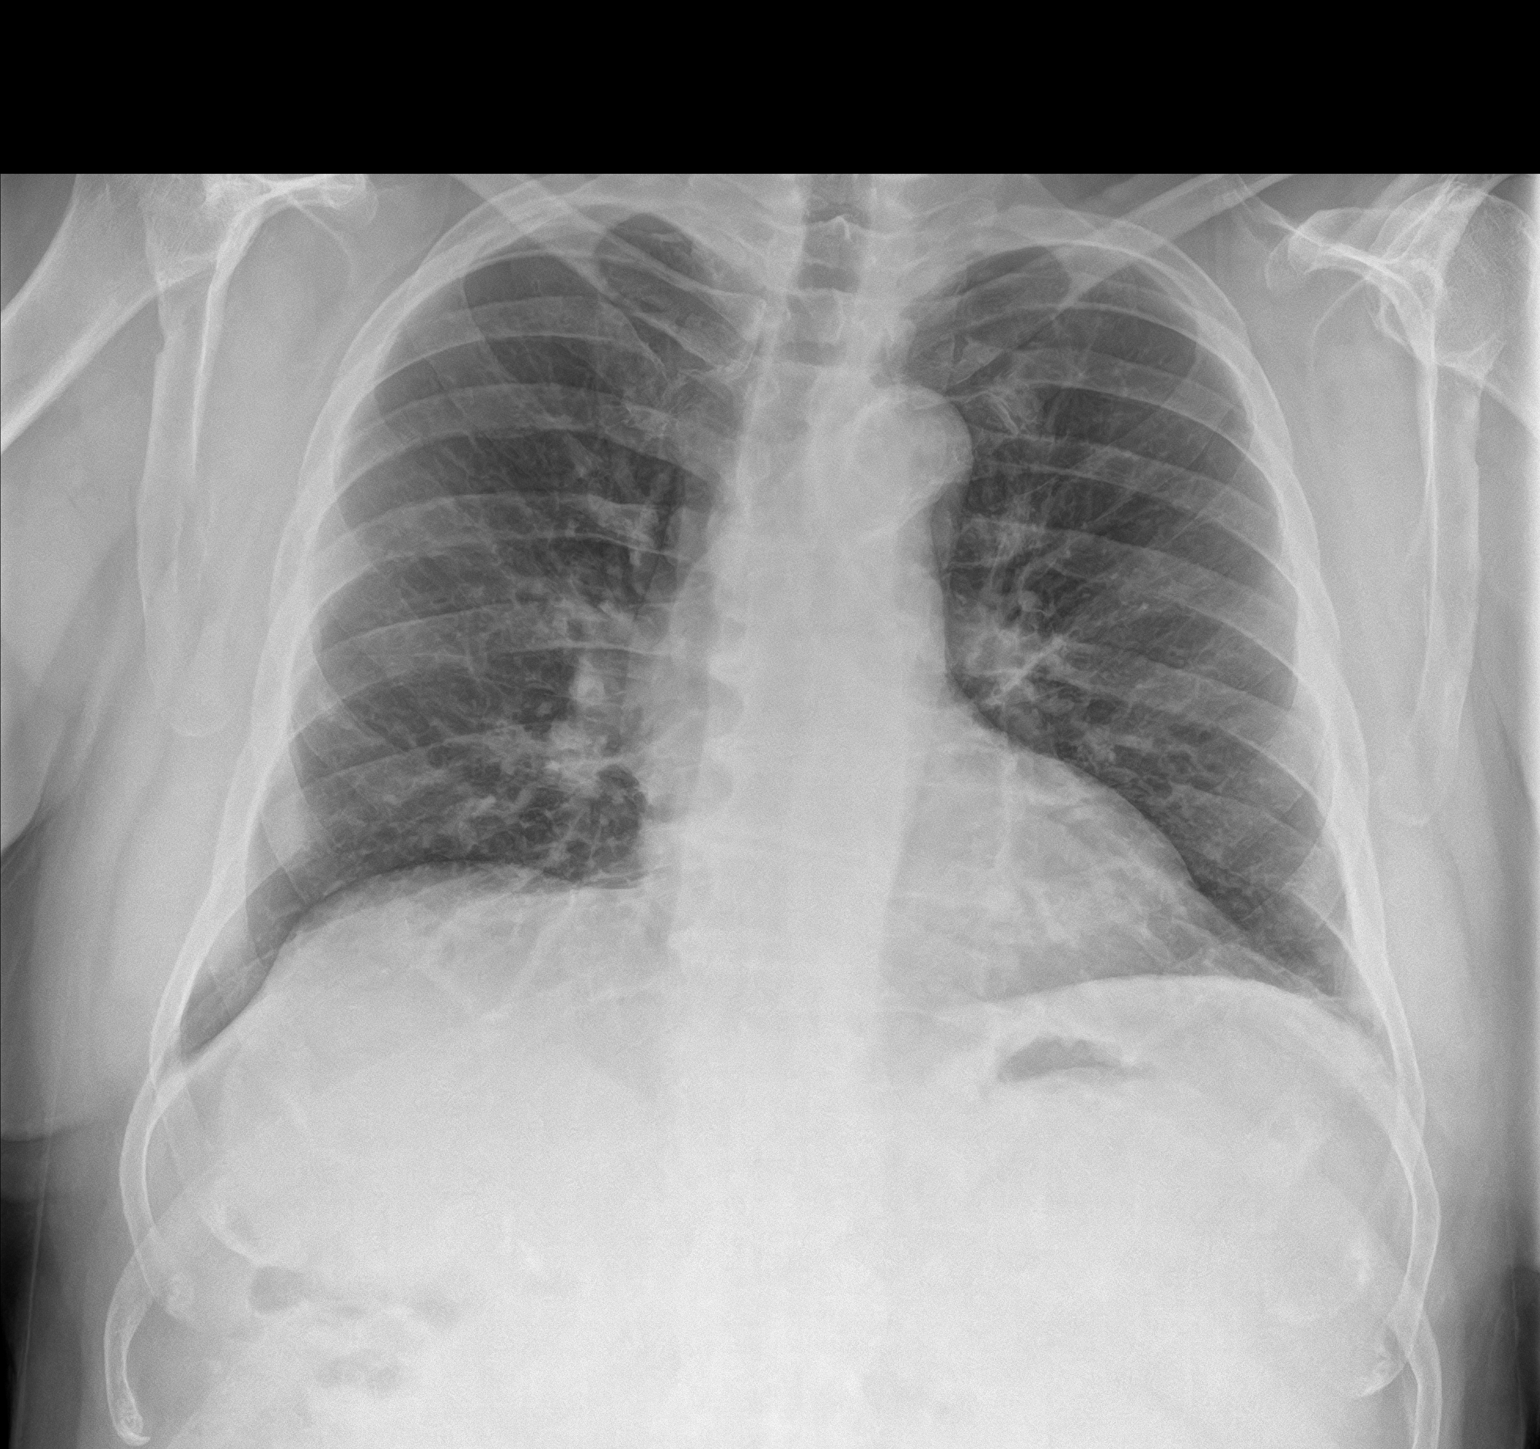
[im 2/2]
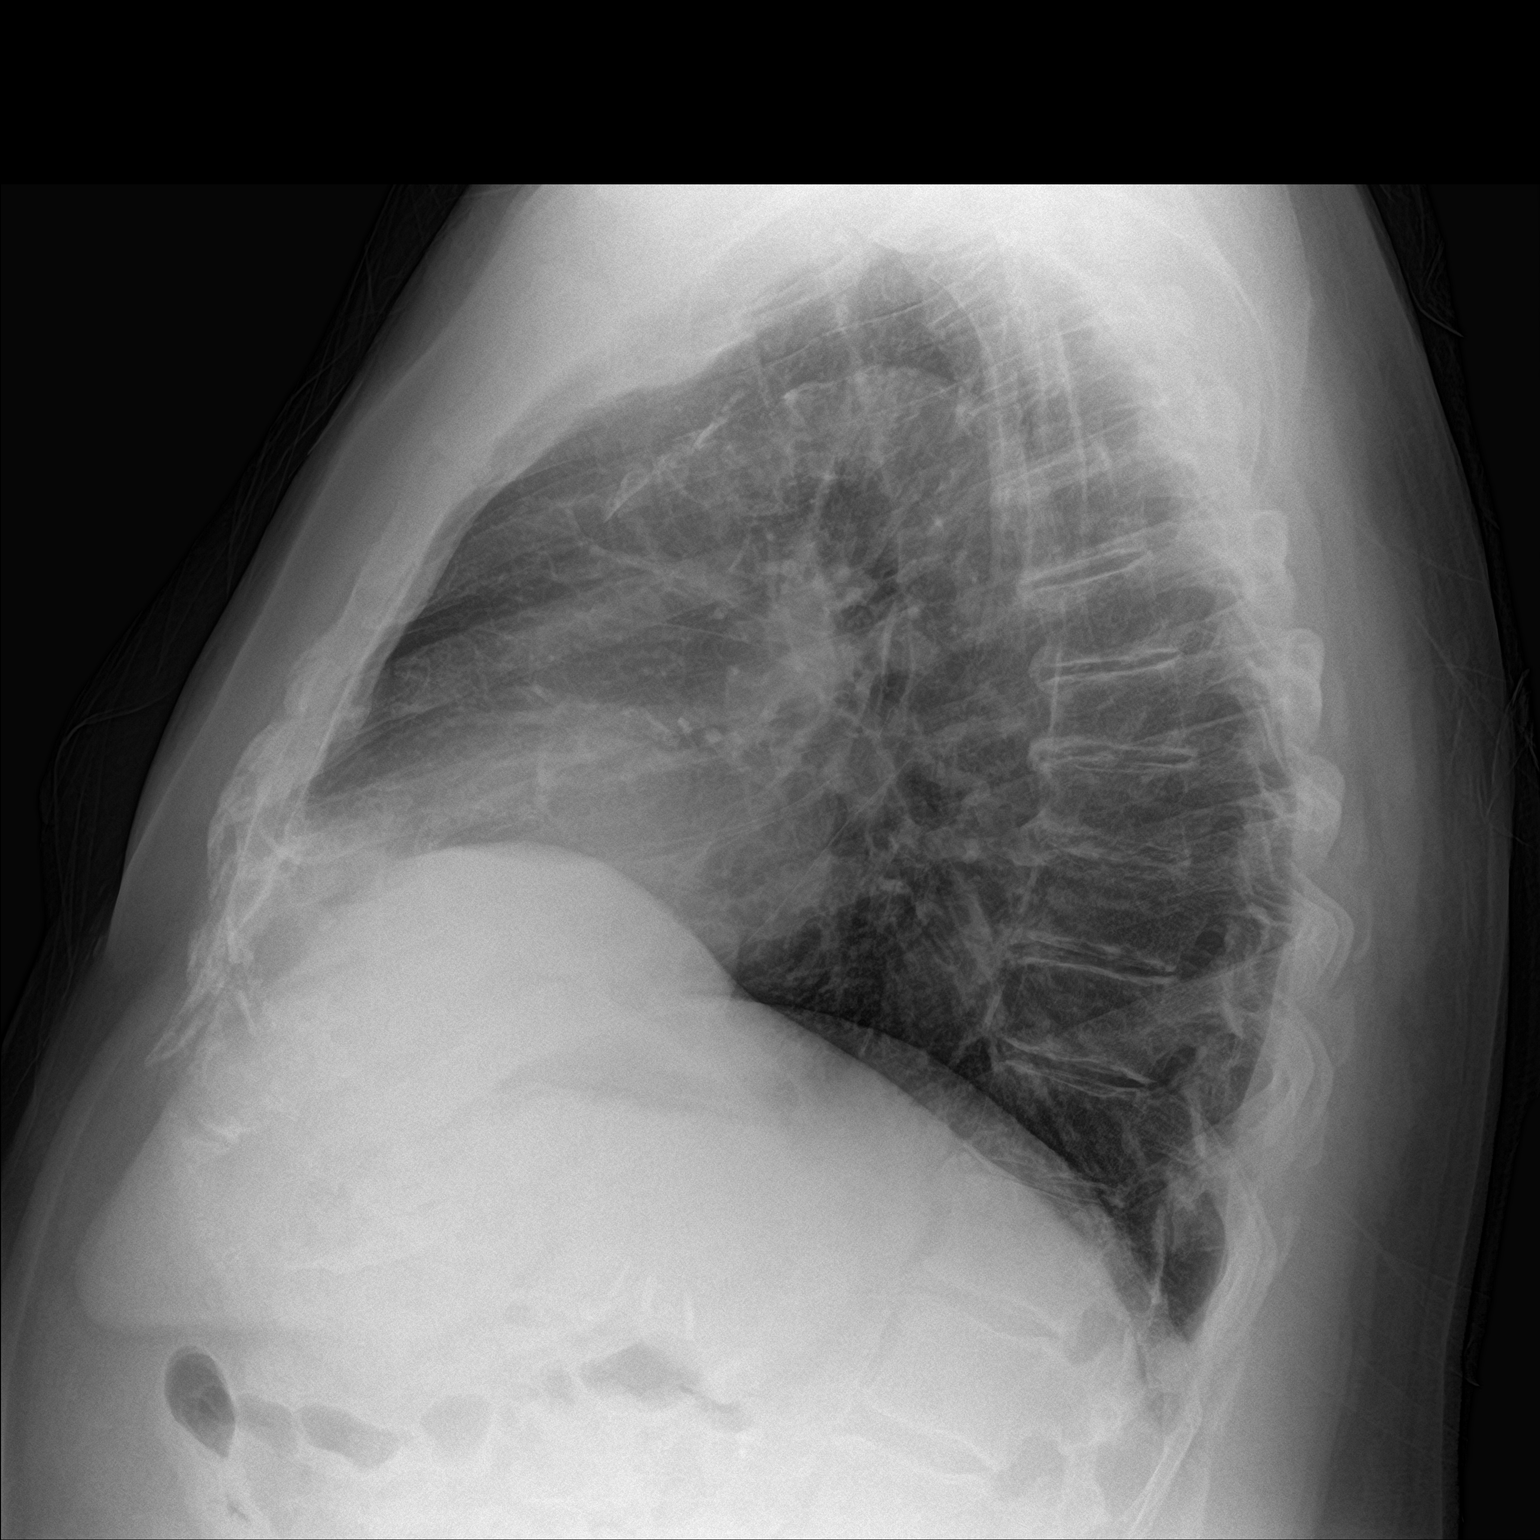

[2 of 2 positions shown; findings below may reference images not displayed]

FINDINGS: No acute pulmonary infiltrate or effusion. Cardiomediastinal
silhouette within normal limits. Aortic atherosclerosis. No
pneumothorax. Degenerative changes of the spine. Surgical clips in
the right upper quadrant.
IMPRESSION: No active cardiopulmonary disease.

## 2019-05-08 ENCOUNTER — Encounter: Payer: Self-pay | Admitting: Family Medicine

## 2019-05-08 DIAGNOSIS — H52213 Irregular astigmatism, bilateral: Secondary | ICD-10-CM | POA: Diagnosis not present

## 2019-05-08 DIAGNOSIS — H25013 Cortical age-related cataract, bilateral: Secondary | ICD-10-CM | POA: Diagnosis not present

## 2019-05-08 DIAGNOSIS — H2511 Age-related nuclear cataract, right eye: Secondary | ICD-10-CM | POA: Diagnosis not present

## 2019-05-08 DIAGNOSIS — H2513 Age-related nuclear cataract, bilateral: Secondary | ICD-10-CM | POA: Diagnosis not present

## 2019-05-08 DIAGNOSIS — H35033 Hypertensive retinopathy, bilateral: Secondary | ICD-10-CM | POA: Diagnosis not present

## 2019-05-08 DIAGNOSIS — H25042 Posterior subcapsular polar age-related cataract, left eye: Secondary | ICD-10-CM | POA: Diagnosis not present

## 2019-05-18 ENCOUNTER — Other Ambulatory Visit: Payer: Self-pay | Admitting: Family Medicine

## 2019-05-18 DIAGNOSIS — I6523 Occlusion and stenosis of bilateral carotid arteries: Secondary | ICD-10-CM

## 2019-05-18 NOTE — Telephone Encounter (Signed)
Requested medication (s) are due for refill today: yes  Requested medication (s) are on the active medication list: yes  Last refill:  07/22/2018  Future visit scheduled: yes  Notes to clinic:  Review for refill   Requested Prescriptions  Pending Prescriptions Disp Refills   allopurinol (ZYLOPRIM) 300 MG tablet [Pharmacy Med Name: ALLOPURINOL 300MG  TABLETS] 90 tablet 4    Sig: TAKE 1 TABLET(300 MG) BY MOUTH DAILY     Endocrinology:  Gout Agents Passed - 05/18/2019  1:13 PM      Passed - Uric Acid in normal range and within 360 days    Uric Acid  Date Value Ref Range Status  02/20/2019 6.4 3.7 - 8.6 mg/dL Final    Comment:               Therapeutic target for gout patients: <6.0         Passed - Cr in normal range and within 360 days    Creatinine  Date Value Ref Range Status  09/05/2014 1.36 (H) 0.60 - 1.30 mg/dL Final   Creatinine, Ser  Date Value Ref Range Status  02/20/2019 1.17 0.76 - 1.27 mg/dL Final         Passed - Valid encounter within last 12 months    Recent Outpatient Visits          2 months ago Encounter for annual physical exam   [place], Henrine Screws T, NP   8 months ago Essential hypertension   [facility] [place], Jeannette How, MD   1 year ago Essential hypertension   [place], Adriana M, PA-C   1 year ago Advanced care planning/counseling discussion   [facility] [place], Jeannette How, MD   1 year ago Screening for colon cancer   [place], MD      Future Appointments            In 3 weeks  [place], [place]   In 3 months [place], Jeannette How, MD [facility]

## 2019-05-18 NOTE — Telephone Encounter (Signed)
Routing to provider  

## 2019-05-31 DIAGNOSIS — I25810 Atherosclerosis of coronary artery bypass graft(s) without angina pectoris: Secondary | ICD-10-CM | POA: Diagnosis not present

## 2019-05-31 DIAGNOSIS — Z7689 Persons encountering health services in other specified circumstances: Secondary | ICD-10-CM | POA: Diagnosis not present

## 2019-05-31 DIAGNOSIS — G4733 Obstructive sleep apnea (adult) (pediatric): Secondary | ICD-10-CM | POA: Diagnosis not present

## 2019-05-31 DIAGNOSIS — E782 Mixed hyperlipidemia: Secondary | ICD-10-CM | POA: Diagnosis not present

## 2019-05-31 DIAGNOSIS — Z7901 Long term (current) use of anticoagulants: Secondary | ICD-10-CM | POA: Diagnosis not present

## 2019-05-31 DIAGNOSIS — I440 Atrioventricular block, first degree: Secondary | ICD-10-CM | POA: Diagnosis not present

## 2019-05-31 DIAGNOSIS — I480 Paroxysmal atrial fibrillation: Secondary | ICD-10-CM | POA: Diagnosis not present

## 2019-05-31 DIAGNOSIS — I10 Essential (primary) hypertension: Secondary | ICD-10-CM | POA: Diagnosis not present

## 2019-06-06 DIAGNOSIS — H52211 Irregular astigmatism, right eye: Secondary | ICD-10-CM | POA: Diagnosis not present

## 2019-06-06 DIAGNOSIS — H25811 Combined forms of age-related cataract, right eye: Secondary | ICD-10-CM | POA: Diagnosis not present

## 2019-06-06 DIAGNOSIS — H2511 Age-related nuclear cataract, right eye: Secondary | ICD-10-CM | POA: Diagnosis not present

## 2019-06-14 ENCOUNTER — Ambulatory Visit: Payer: Medicare Other

## 2019-06-14 DIAGNOSIS — C61 Malignant neoplasm of prostate: Secondary | ICD-10-CM | POA: Diagnosis not present

## 2019-06-14 DIAGNOSIS — R972 Elevated prostate specific antigen [PSA]: Secondary | ICD-10-CM | POA: Diagnosis not present

## 2019-06-14 DIAGNOSIS — D400 Neoplasm of uncertain behavior of prostate: Secondary | ICD-10-CM | POA: Diagnosis not present

## 2019-06-14 DIAGNOSIS — E669 Obesity, unspecified: Secondary | ICD-10-CM | POA: Diagnosis not present

## 2019-06-16 DIAGNOSIS — C61 Malignant neoplasm of prostate: Secondary | ICD-10-CM | POA: Diagnosis not present

## 2019-06-16 DIAGNOSIS — D400 Neoplasm of uncertain behavior of prostate: Secondary | ICD-10-CM | POA: Diagnosis not present

## 2019-06-16 DIAGNOSIS — R972 Elevated prostate specific antigen [PSA]: Secondary | ICD-10-CM | POA: Diagnosis not present

## 2019-06-23 DIAGNOSIS — R7303 Prediabetes: Secondary | ICD-10-CM | POA: Diagnosis not present

## 2019-06-23 DIAGNOSIS — Z79899 Other long term (current) drug therapy: Secondary | ICD-10-CM | POA: Diagnosis not present

## 2019-06-23 DIAGNOSIS — R0602 Shortness of breath: Secondary | ICD-10-CM | POA: Diagnosis not present

## 2019-06-23 DIAGNOSIS — R0600 Dyspnea, unspecified: Secondary | ICD-10-CM | POA: Diagnosis not present

## 2019-06-23 DIAGNOSIS — M1A00X0 Idiopathic chronic gout, unspecified site, without tophus (tophi): Secondary | ICD-10-CM | POA: Insufficient documentation

## 2019-06-23 DIAGNOSIS — Z7289 Other problems related to lifestyle: Secondary | ICD-10-CM | POA: Diagnosis not present

## 2019-06-23 DIAGNOSIS — I25810 Atherosclerosis of coronary artery bypass graft(s) without angina pectoris: Secondary | ICD-10-CM | POA: Diagnosis not present

## 2019-06-23 DIAGNOSIS — E785 Hyperlipidemia, unspecified: Secondary | ICD-10-CM | POA: Diagnosis not present

## 2019-06-23 DIAGNOSIS — Z87891 Personal history of nicotine dependence: Secondary | ICD-10-CM | POA: Diagnosis not present

## 2019-06-23 DIAGNOSIS — I480 Paroxysmal atrial fibrillation: Secondary | ICD-10-CM | POA: Diagnosis not present

## 2019-06-23 DIAGNOSIS — I10 Essential (primary) hypertension: Secondary | ICD-10-CM | POA: Diagnosis not present

## 2019-06-26 ENCOUNTER — Encounter: Payer: Self-pay | Admitting: Family Medicine

## 2019-06-26 DIAGNOSIS — H25042 Posterior subcapsular polar age-related cataract, left eye: Secondary | ICD-10-CM | POA: Diagnosis not present

## 2019-06-26 DIAGNOSIS — H2512 Age-related nuclear cataract, left eye: Secondary | ICD-10-CM | POA: Diagnosis not present

## 2019-06-26 DIAGNOSIS — H25012 Cortical age-related cataract, left eye: Secondary | ICD-10-CM | POA: Diagnosis not present

## 2019-07-04 DIAGNOSIS — H52212 Irregular astigmatism, left eye: Secondary | ICD-10-CM | POA: Diagnosis not present

## 2019-07-04 DIAGNOSIS — H25812 Combined forms of age-related cataract, left eye: Secondary | ICD-10-CM | POA: Diagnosis not present

## 2019-07-04 DIAGNOSIS — H2512 Age-related nuclear cataract, left eye: Secondary | ICD-10-CM | POA: Diagnosis not present

## 2019-07-11 DIAGNOSIS — L400 Psoriasis vulgaris: Secondary | ICD-10-CM | POA: Diagnosis not present

## 2019-07-27 ENCOUNTER — Other Ambulatory Visit: Payer: Self-pay

## 2019-07-27 DIAGNOSIS — Z20828 Contact with and (suspected) exposure to other viral communicable diseases: Secondary | ICD-10-CM | POA: Diagnosis not present

## 2019-07-27 DIAGNOSIS — Z20822 Contact with and (suspected) exposure to covid-19: Secondary | ICD-10-CM

## 2019-07-28 LAB — NOVEL CORONAVIRUS, NAA: SARS-CoV-2, NAA: NOT DETECTED

## 2019-07-31 ENCOUNTER — Telehealth: Payer: Self-pay

## 2019-07-31 NOTE — Telephone Encounter (Signed)
Caller given negative result and verbalized understanding  

## 2019-08-24 ENCOUNTER — Ambulatory Visit: Payer: Medicare Other | Admitting: Family Medicine

## 2019-09-08 IMAGING — DX DG CHEST 1V PORT
1 series · 1 of 1 positions shown · non-contrast
Comparison: 12/06/2017 chest radiograph

CLINICAL DATA: 71 y/o  M; chest pain and discomfort.

EXAM:
PORTABLE CHEST 1 VIEW

[chest ap]
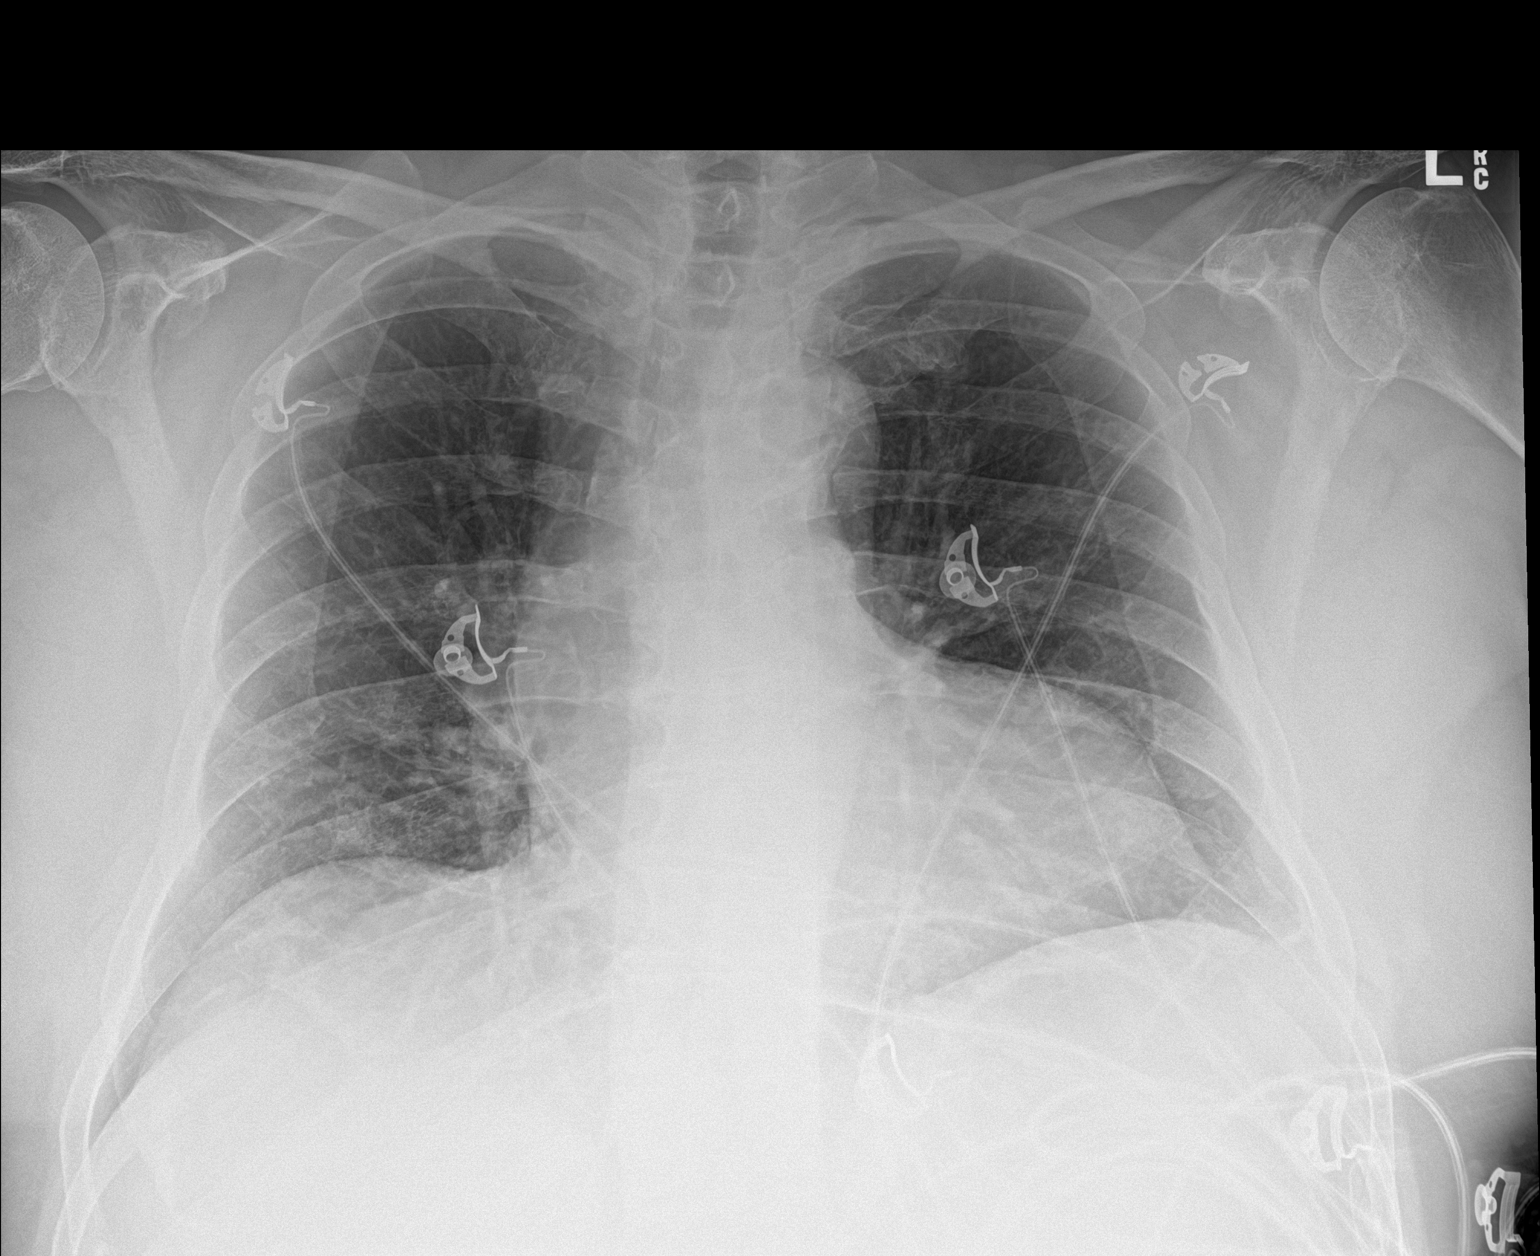

[1 of 1 positions shown; findings below may reference images not displayed]

FINDINGS: Stable cardiac silhouette within normal limits given projection and
technique. Aortic atherosclerosis with calcification. Clear lungs.
No pleural effusion or pneumothorax. No acute osseous abnormality is
evident.
IMPRESSION: No acute pulmonary process identified.  Aortic atherosclerosis.

By: Kaona Nabo M.D.

## 2019-09-19 DIAGNOSIS — D225 Melanocytic nevi of trunk: Secondary | ICD-10-CM | POA: Diagnosis not present

## 2019-09-19 DIAGNOSIS — D2262 Melanocytic nevi of left upper limb, including shoulder: Secondary | ICD-10-CM | POA: Diagnosis not present

## 2019-09-19 DIAGNOSIS — L570 Actinic keratosis: Secondary | ICD-10-CM | POA: Diagnosis not present

## 2019-09-19 DIAGNOSIS — Z85828 Personal history of other malignant neoplasm of skin: Secondary | ICD-10-CM | POA: Diagnosis not present

## 2019-09-19 DIAGNOSIS — X32XXXA Exposure to sunlight, initial encounter: Secondary | ICD-10-CM | POA: Diagnosis not present

## 2019-09-19 DIAGNOSIS — D485 Neoplasm of uncertain behavior of skin: Secondary | ICD-10-CM | POA: Diagnosis not present

## 2019-09-19 DIAGNOSIS — L821 Other seborrheic keratosis: Secondary | ICD-10-CM | POA: Diagnosis not present

## 2019-09-19 DIAGNOSIS — C44519 Basal cell carcinoma of skin of other part of trunk: Secondary | ICD-10-CM | POA: Diagnosis not present

## 2019-09-19 DIAGNOSIS — L400 Psoriasis vulgaris: Secondary | ICD-10-CM | POA: Diagnosis not present

## 2019-09-19 DIAGNOSIS — D2271 Melanocytic nevi of right lower limb, including hip: Secondary | ICD-10-CM | POA: Diagnosis not present

## 2019-09-21 DIAGNOSIS — Z79899 Other long term (current) drug therapy: Secondary | ICD-10-CM | POA: Diagnosis not present

## 2019-09-21 DIAGNOSIS — L400 Psoriasis vulgaris: Secondary | ICD-10-CM | POA: Diagnosis not present

## 2019-09-21 DIAGNOSIS — E782 Mixed hyperlipidemia: Secondary | ICD-10-CM | POA: Diagnosis not present

## 2019-09-21 DIAGNOSIS — I10 Essential (primary) hypertension: Secondary | ICD-10-CM | POA: Diagnosis not present

## 2019-09-21 DIAGNOSIS — R7303 Prediabetes: Secondary | ICD-10-CM | POA: Diagnosis not present

## 2019-09-25 DIAGNOSIS — Z87891 Personal history of nicotine dependence: Secondary | ICD-10-CM | POA: Diagnosis not present

## 2019-09-25 DIAGNOSIS — Z79899 Other long term (current) drug therapy: Secondary | ICD-10-CM | POA: Diagnosis not present

## 2019-09-25 DIAGNOSIS — I480 Paroxysmal atrial fibrillation: Secondary | ICD-10-CM | POA: Diagnosis not present

## 2019-09-25 DIAGNOSIS — I10 Essential (primary) hypertension: Secondary | ICD-10-CM | POA: Diagnosis not present

## 2019-09-25 DIAGNOSIS — R7303 Prediabetes: Secondary | ICD-10-CM | POA: Diagnosis not present

## 2019-09-25 DIAGNOSIS — E782 Mixed hyperlipidemia: Secondary | ICD-10-CM | POA: Diagnosis not present

## 2019-09-25 DIAGNOSIS — Z7901 Long term (current) use of anticoagulants: Secondary | ICD-10-CM | POA: Diagnosis not present

## 2019-09-26 ENCOUNTER — Ambulatory Visit: Payer: Medicare Other | Attending: Internal Medicine

## 2019-09-26 DIAGNOSIS — Z20822 Contact with and (suspected) exposure to covid-19: Secondary | ICD-10-CM

## 2019-09-27 LAB — NOVEL CORONAVIRUS, NAA: SARS-CoV-2, NAA: NOT DETECTED

## 2019-10-17 DIAGNOSIS — L400 Psoriasis vulgaris: Secondary | ICD-10-CM | POA: Diagnosis not present

## 2019-10-18 DIAGNOSIS — C44519 Basal cell carcinoma of skin of other part of trunk: Secondary | ICD-10-CM | POA: Diagnosis not present

## 2019-11-10 DIAGNOSIS — H35033 Hypertensive retinopathy, bilateral: Secondary | ICD-10-CM | POA: Diagnosis not present

## 2019-11-10 DIAGNOSIS — H02839 Dermatochalasis of unspecified eye, unspecified eyelid: Secondary | ICD-10-CM | POA: Diagnosis not present

## 2019-11-10 DIAGNOSIS — H26493 Other secondary cataract, bilateral: Secondary | ICD-10-CM | POA: Diagnosis not present

## 2019-11-14 DIAGNOSIS — R972 Elevated prostate specific antigen [PSA]: Secondary | ICD-10-CM | POA: Diagnosis not present

## 2019-11-14 DIAGNOSIS — N5201 Erectile dysfunction due to arterial insufficiency: Secondary | ICD-10-CM | POA: Diagnosis not present

## 2019-11-14 DIAGNOSIS — D400 Neoplasm of uncertain behavior of prostate: Secondary | ICD-10-CM | POA: Diagnosis not present

## 2019-11-14 DIAGNOSIS — C61 Malignant neoplasm of prostate: Secondary | ICD-10-CM | POA: Diagnosis not present

## 2019-11-27 DIAGNOSIS — C61 Malignant neoplasm of prostate: Secondary | ICD-10-CM | POA: Diagnosis not present

## 2019-11-27 DIAGNOSIS — D409 Neoplasm of uncertain behavior of male genital organ, unspecified: Secondary | ICD-10-CM | POA: Diagnosis not present

## 2019-11-28 DIAGNOSIS — G4733 Obstructive sleep apnea (adult) (pediatric): Secondary | ICD-10-CM | POA: Diagnosis not present

## 2019-12-03 DIAGNOSIS — G4733 Obstructive sleep apnea (adult) (pediatric): Secondary | ICD-10-CM | POA: Diagnosis not present

## 2019-12-04 DIAGNOSIS — I25708 Atherosclerosis of coronary artery bypass graft(s), unspecified, with other forms of angina pectoris: Secondary | ICD-10-CM | POA: Diagnosis not present

## 2019-12-04 DIAGNOSIS — G4733 Obstructive sleep apnea (adult) (pediatric): Secondary | ICD-10-CM | POA: Diagnosis not present

## 2019-12-04 DIAGNOSIS — E782 Mixed hyperlipidemia: Secondary | ICD-10-CM | POA: Diagnosis not present

## 2019-12-04 DIAGNOSIS — I255 Ischemic cardiomyopathy: Secondary | ICD-10-CM | POA: Diagnosis not present

## 2019-12-04 DIAGNOSIS — I10 Essential (primary) hypertension: Secondary | ICD-10-CM | POA: Diagnosis not present

## 2019-12-13 DIAGNOSIS — D400 Neoplasm of uncertain behavior of prostate: Secondary | ICD-10-CM | POA: Diagnosis not present

## 2019-12-13 DIAGNOSIS — C61 Malignant neoplasm of prostate: Secondary | ICD-10-CM | POA: Diagnosis not present

## 2019-12-13 DIAGNOSIS — R972 Elevated prostate specific antigen [PSA]: Secondary | ICD-10-CM | POA: Diagnosis not present

## 2019-12-19 ENCOUNTER — Ambulatory Visit (INDEPENDENT_AMBULATORY_CARE_PROVIDER_SITE_OTHER): Payer: Medicare Other

## 2019-12-19 ENCOUNTER — Other Ambulatory Visit: Payer: Self-pay

## 2019-12-19 ENCOUNTER — Ambulatory Visit (INDEPENDENT_AMBULATORY_CARE_PROVIDER_SITE_OTHER): Payer: Medicare Other | Admitting: Vascular Surgery

## 2019-12-19 ENCOUNTER — Encounter (INDEPENDENT_AMBULATORY_CARE_PROVIDER_SITE_OTHER): Payer: Self-pay | Admitting: Vascular Surgery

## 2019-12-19 VITALS — BP 144/83 | HR 50 | Resp 14 | Ht 67.0 in | Wt 223.0 lb

## 2019-12-19 DIAGNOSIS — I480 Paroxysmal atrial fibrillation: Secondary | ICD-10-CM | POA: Diagnosis not present

## 2019-12-19 DIAGNOSIS — I6523 Occlusion and stenosis of bilateral carotid arteries: Secondary | ICD-10-CM | POA: Diagnosis not present

## 2019-12-19 DIAGNOSIS — I10 Essential (primary) hypertension: Secondary | ICD-10-CM | POA: Diagnosis not present

## 2019-12-19 NOTE — Progress Notes (Signed)
MRN : [id number]  Darrell [name] is a 74 y.o. (1945-10-20) male who presents with chief complaint of  Chief Complaint  Patient presents with  . Follow-up    ultrasound  .  History of Present Illness: Patient returns in follow-up of his carotid disease.  He is doing well today without any specific complaints.  We had a good discussion about many things regarding the city of [place] today where he is the [place].  His carotid duplex shows no progression of his disease with stable 1 to 39% right ICA stenosis and stable 40 to 59% left ICA stenosis.  Current Outpatient Medications  Medication Sig Dispense Refill  . allopurinol (ZYLOPRIM) 300 MG tablet TAKE 1 TABLET(300 MG) BY MOUTH DAILY 90 tablet 3  . apixaban (ELIQUIS) 5 MG TABS tablet Take 5 mg by mouth 2 (two) times daily.     Marland Kitchen aspirin EC 81 MG tablet Take by mouth.    Marland Kitchen atorvastatin (LIPITOR) 80 MG tablet Take 80 mg by mouth daily.    . carvedilol (COREG) 6.25 MG tablet Take by mouth.    . hydrochlorothiazide (HYDRODIURIL) 25 MG tablet Take 1 tablet (25 mg total) by mouth daily. 90 tablet 4  . amLODipine (NORVASC) 5 MG tablet Take 5 mg by mouth daily.     No current facility-administered medications for this visit.    Past Medical History:  Diagnosis Date  . CAD (coronary artery disease)   . Cancer [facility])    prostate  . Carotid artery stenosis   . Hyperlipidemia   . Hypertension   . Psoriasis     Past Surgical History:  Procedure Laterality Date  . CHOLECYSTECTOMY    . COLONOSCOPY    . COLONOSCOPY WITH PROPOFOL N/A 03/25/2018   Procedure: COLONOSCOPY WITH PROPOFOL;  Surgeon: Jonathon Bellows, MD;  Location: [facility] END[facility]COPY;  Service: Gastroenterology;  Laterality: N/A;  . CORONARY ANGIOPLASTY WITH STENT PLACEMENT    . CORONARY ARTERY BYPASS GRAFT Right 04/21/2018  . LEFT HEART CATH AND CORONARY ANGIOGRAPHY N/A 05/21/2017   Procedure: LEFT HEART CATH AND CORONARY ANGIOGRAPHY;  Surgeon: Teodoro Spray, MD;  Location: [place] [facility];  Service: Cardiovascular;  Laterality: N/A;  . LIVER BIOPSY  1999  . SHOULDER SURGERY    . stents       Social History   Tobacco Use  . Smoking status: Former Smoker    Types: Cigarettes    Quit date: 1989    Years since quitting: 32.3  . Smokeless tobacco: Never Used  . Tobacco comment: quit 30 years ago   Substance Use Topics  . Alcohol use: Yes    Alcohol/week: 1.0 standard drinks    Types: 1 Cans of beer per week    Comment: occasioanlly   . Drug use: No       Family History  Problem Relation Age of Onset  . Heart attack Mother   . Diabetes Mother   . Stroke Mother   . Heart disease Mother   . Diabetes Brother   . Heart Problems Brother   . Diabetes Sister   . Lung cancer Maternal Aunt   . Esophageal cancer Sister      No Known Allergies   REVIEW OF SYSTEMS (Negative unless checked)  Constitutional: [] Weight loss  [] Fever  [] Chills Cardiac: [] Chest pain   [] Chest pressure   [] Palpitations   [] Shortness of breath when laying flat   [] Shortness of breath at rest   [] Shortness of breath  with exertion. Vascular:  [] Pain in legs with walking   [] Pain in legs at rest   [] Pain in legs when laying flat   [] Claudication   [] Pain in feet when walking  [] Pain in feet at rest  [] Pain in feet when laying flat   [] History of DVT   [] Phlebitis   [] Swelling in legs   [] Varicose veins   [] Non-healing ulcers Pulmonary:   [] Uses home oxygen   [] Productive cough   [] Hemoptysis   [] Wheeze  [] COPD   [] Asthma Neurologic:  [] Dizziness  [] Blackouts   [] Seizures   [] History of stroke   [] History of TIA  [] Aphasia   [] Temporary blindness   [] Dysphagia   [] Weakness or numbness in arms   [] Weakness or numbness in legs Musculoskeletal:  [] Arthritis   [] Joint swelling   [] Joint pain   [] Low back pain Hematologic:  [] Easy bruising  [] Easy bleeding   [] Hypercoagulable state   [] Anemic  [] Hepatitis Gastrointestinal:  [] Blood in stool   [] Vomiting blood  [] Gastroesophageal  reflux/heartburn   [] Difficulty swallowing. Genitourinary:  [] Chronic kidney disease   [] Difficult urination  [] Frequent urination  [] Burning with urination   [] Blood in urine Skin:  [] Rashes   [] Ulcers   [] Wounds Psychological:  [] History of anxiety   []  History of major depression.  Physical Examination  Vitals:   12/19/19 1122 12/19/19 1123  BP: 131/78 (!) 144/83  Pulse: (!) 50   Resp: 14   Weight: 223 lb (101.2 kg)   Height: 5\' 7"  (1.702 m)    Body mass index is 34.93 kg/m. Gen:  WD/WN, NAD. Appears younger than stated age. Head: [place]/AT, No temporalis wasting. Ear/Nose/Throat: Hearing grossly intact, nares w/o erythema or drainage, trachea midline Eyes: Conjunctiva clear. Sclera non-icteric Neck: Supple. Trachea midline Pulmonary:  Good air movement, equal and clear to auscultation bilaterally.  Cardiac: RRR, No JVD Vascular:  Vessel Right Left  Radial Palpable Palpable       Musculoskeletal: M/S 5/5 throughout.  No deformity or atrophy. No edema. Neurologic: CN 2-12 intact. Sensation grossly intact in extremities.  Symmetrical.  Speech is fluent. Motor exam as listed above. Psychiatric: Judgment intact, Mood & affect appropriate for pt's clinical situation. Dermatologic: No rashes or ulcers noted.  No cellulitis or open wounds.      CBC Lab Results  Component Value Date   WBC 4.6 02/20/2019   HGB 13.3 02/20/2019   HCT 39.6 02/20/2019   MCV 96 02/20/2019   PLT 127 (L) 02/20/2019    BMET    Component Value Date/Time   NA 142 02/20/2019 0930   NA 135 (L) 09/05/2014 0504   K 4.3 02/20/2019 0930   K 3.8 09/05/2014 0504   CL 108 (H) 02/20/2019 0930   CL 98 09/05/2014 0504   CO2 22 02/20/2019 0930   CO2 32 09/05/2014 0504   GLUCOSE 147 (H) 02/20/2019 0930   GLUCOSE 131 (H) 04/10/2018 0154   GLUCOSE 108 (H) 09/05/2014 0504   BUN 19 02/20/2019 0930   BUN 26 (H) 09/05/2014 0504   CREATININE 1.17 02/20/2019 0930   CREATININE 1.36 (H) 09/05/2014 0504    CALCIUM 9.2 02/20/2019 0930   CALCIUM 7.8 (L) 09/05/2014 0504   GFRNONAA 62 02/20/2019 0930   GFRNONAA 56 (L) 09/05/2014 0504   GFRNONAA 57 (L) 07/28/2012 0409   GFRAA 72 02/20/2019 0930   GFRAA >60 09/05/2014 0504   GFRAA >60 07/28/2012 0409   CrCl cannot be calculated (Patient's most recent lab result is older than the maximum  21 days allowed.).  COAG Lab Results  Component Value Date   INR 0.9 09/02/2014    Radiology No results found.   Assessment/Plan Hyperlipidemia lipid control important in reducing the progression of atherosclerotic disease. Continue statin therapy   Hypertension blood pressure control important in reducing the progression of atherosclerotic disease. On appropriate oral medications.  Paroxysmal A-fib (HCC) On anticoagulation  Carotid stenosis His carotid duplex shows no progression of his disease with stable 1 to 39% right ICA stenosis and stable 40 to 59% left ICA stenosis.  He is already on Eliquis for his atrial fibrillation and will continue this.  He is also on aspirin and Lipitor.  No change in his medical regimen.  Continue to follow this on an annual basis or sooner if problems develop in the interim.    Leotis Pain, MD  12/19/2019 12:16 PM    This note was created with Dragon medical transcription system.  Any errors from dictation are purely unintentional

## 2019-12-19 NOTE — Assessment & Plan Note (Signed)
His carotid duplex shows no progression of his disease with stable 1 to 39% right ICA stenosis and stable 40 to 59% left ICA stenosis.  He is already on Eliquis for his atrial fibrillation and will continue this.  He is also on aspirin and Lipitor.  No change in his medical regimen.  Continue to follow this on an annual basis or sooner if problems develop in the interim.

## 2019-12-27 DIAGNOSIS — I25708 Atherosclerosis of coronary artery bypass graft(s), unspecified, with other forms of angina pectoris: Secondary | ICD-10-CM | POA: Diagnosis not present

## 2020-01-17 DIAGNOSIS — E782 Mixed hyperlipidemia: Secondary | ICD-10-CM | POA: Diagnosis not present

## 2020-01-17 DIAGNOSIS — I25810 Atherosclerosis of coronary artery bypass graft(s) without angina pectoris: Secondary | ICD-10-CM | POA: Diagnosis not present

## 2020-01-17 DIAGNOSIS — I480 Paroxysmal atrial fibrillation: Secondary | ICD-10-CM | POA: Diagnosis not present

## 2020-01-17 DIAGNOSIS — I10 Essential (primary) hypertension: Secondary | ICD-10-CM | POA: Diagnosis not present

## 2020-01-17 DIAGNOSIS — I255 Ischemic cardiomyopathy: Secondary | ICD-10-CM | POA: Diagnosis not present

## 2020-01-26 DIAGNOSIS — H5340 Unspecified visual field defects: Secondary | ICD-10-CM | POA: Diagnosis not present

## 2020-01-26 DIAGNOSIS — H02831 Dermatochalasis of right upper eyelid: Secondary | ICD-10-CM | POA: Diagnosis not present

## 2020-01-26 DIAGNOSIS — H02834 Dermatochalasis of left upper eyelid: Secondary | ICD-10-CM | POA: Diagnosis not present

## 2020-01-26 DIAGNOSIS — H02403 Unspecified ptosis of bilateral eyelids: Secondary | ICD-10-CM | POA: Diagnosis not present

## 2020-02-29 DIAGNOSIS — D0461 Carcinoma in situ of skin of right upper limb, including shoulder: Secondary | ICD-10-CM | POA: Diagnosis not present

## 2020-03-18 DIAGNOSIS — Z79899 Other long term (current) drug therapy: Secondary | ICD-10-CM | POA: Diagnosis not present

## 2020-03-18 DIAGNOSIS — E782 Mixed hyperlipidemia: Secondary | ICD-10-CM | POA: Diagnosis not present

## 2020-03-18 DIAGNOSIS — R7303 Prediabetes: Secondary | ICD-10-CM | POA: Diagnosis not present

## 2020-03-25 DIAGNOSIS — E782 Mixed hyperlipidemia: Secondary | ICD-10-CM | POA: Diagnosis not present

## 2020-03-25 DIAGNOSIS — I480 Paroxysmal atrial fibrillation: Secondary | ICD-10-CM | POA: Diagnosis not present

## 2020-03-25 DIAGNOSIS — I25810 Atherosclerosis of coronary artery bypass graft(s) without angina pectoris: Secondary | ICD-10-CM | POA: Diagnosis not present

## 2020-03-25 DIAGNOSIS — I10 Essential (primary) hypertension: Secondary | ICD-10-CM | POA: Diagnosis not present

## 2020-03-25 DIAGNOSIS — G4733 Obstructive sleep apnea (adult) (pediatric): Secondary | ICD-10-CM | POA: Diagnosis not present

## 2020-04-29 DIAGNOSIS — M1A00X0 Idiopathic chronic gout, unspecified site, without tophus (tophi): Secondary | ICD-10-CM | POA: Diagnosis not present

## 2020-04-29 DIAGNOSIS — Z01818 Encounter for other preprocedural examination: Secondary | ICD-10-CM | POA: Diagnosis not present

## 2020-04-29 DIAGNOSIS — I255 Ischemic cardiomyopathy: Secondary | ICD-10-CM | POA: Diagnosis not present

## 2020-04-29 DIAGNOSIS — G4730 Sleep apnea, unspecified: Secondary | ICD-10-CM | POA: Diagnosis not present

## 2020-04-29 DIAGNOSIS — I10 Essential (primary) hypertension: Secondary | ICD-10-CM | POA: Diagnosis not present

## 2020-04-29 DIAGNOSIS — I2510 Atherosclerotic heart disease of native coronary artery without angina pectoris: Secondary | ICD-10-CM | POA: Diagnosis not present

## 2020-04-29 DIAGNOSIS — L4050 Arthropathic psoriasis, unspecified: Secondary | ICD-10-CM | POA: Diagnosis not present

## 2020-04-29 DIAGNOSIS — I480 Paroxysmal atrial fibrillation: Secondary | ICD-10-CM | POA: Diagnosis not present

## 2020-04-29 DIAGNOSIS — Z6834 Body mass index (BMI) 34.0-34.9, adult: Secondary | ICD-10-CM | POA: Diagnosis not present

## 2020-05-01 DIAGNOSIS — L400 Psoriasis vulgaris: Secondary | ICD-10-CM | POA: Diagnosis not present

## 2020-05-07 DIAGNOSIS — H26493 Other secondary cataract, bilateral: Secondary | ICD-10-CM | POA: Diagnosis not present

## 2020-05-07 DIAGNOSIS — H02889 Meibomian gland dysfunction of unspecified eye, unspecified eyelid: Secondary | ICD-10-CM | POA: Diagnosis not present

## 2020-05-07 DIAGNOSIS — H02839 Dermatochalasis of unspecified eye, unspecified eyelid: Secondary | ICD-10-CM | POA: Diagnosis not present

## 2020-05-07 DIAGNOSIS — H35033 Hypertensive retinopathy, bilateral: Secondary | ICD-10-CM | POA: Diagnosis not present

## 2020-05-20 DIAGNOSIS — Z01818 Encounter for other preprocedural examination: Secondary | ICD-10-CM | POA: Diagnosis not present

## 2020-05-22 DIAGNOSIS — M1A9XX0 Chronic gout, unspecified, without tophus (tophi): Secondary | ICD-10-CM | POA: Diagnosis not present

## 2020-05-22 DIAGNOSIS — Z9884 Bariatric surgery status: Secondary | ICD-10-CM | POA: Diagnosis not present

## 2020-05-22 DIAGNOSIS — Z7901 Long term (current) use of anticoagulants: Secondary | ICD-10-CM | POA: Diagnosis not present

## 2020-05-22 DIAGNOSIS — Z79899 Other long term (current) drug therapy: Secondary | ICD-10-CM | POA: Diagnosis not present

## 2020-05-22 DIAGNOSIS — H35039 Hypertensive retinopathy, unspecified eye: Secondary | ICD-10-CM | POA: Diagnosis not present

## 2020-05-22 DIAGNOSIS — I2510 Atherosclerotic heart disease of native coronary artery without angina pectoris: Secondary | ICD-10-CM | POA: Diagnosis not present

## 2020-05-22 DIAGNOSIS — Z955 Presence of coronary angioplasty implant and graft: Secondary | ICD-10-CM | POA: Diagnosis not present

## 2020-05-22 DIAGNOSIS — E785 Hyperlipidemia, unspecified: Secondary | ICD-10-CM | POA: Diagnosis not present

## 2020-05-22 DIAGNOSIS — H02883 Meibomian gland dysfunction of right eye, unspecified eyelid: Secondary | ICD-10-CM | POA: Diagnosis not present

## 2020-05-22 DIAGNOSIS — H02886 Meibomian gland dysfunction of left eye, unspecified eyelid: Secondary | ICD-10-CM | POA: Diagnosis not present

## 2020-05-22 DIAGNOSIS — H02834 Dermatochalasis of left upper eyelid: Secondary | ICD-10-CM | POA: Diagnosis not present

## 2020-05-22 DIAGNOSIS — H5340 Unspecified visual field defects: Secondary | ICD-10-CM | POA: Diagnosis not present

## 2020-05-22 DIAGNOSIS — Z7982 Long term (current) use of aspirin: Secondary | ICD-10-CM | POA: Diagnosis not present

## 2020-05-22 DIAGNOSIS — I5020 Unspecified systolic (congestive) heart failure: Secondary | ICD-10-CM | POA: Diagnosis not present

## 2020-05-22 DIAGNOSIS — I480 Paroxysmal atrial fibrillation: Secondary | ICD-10-CM | POA: Diagnosis not present

## 2020-05-22 DIAGNOSIS — I739 Peripheral vascular disease, unspecified: Secondary | ICD-10-CM | POA: Diagnosis not present

## 2020-05-22 DIAGNOSIS — H02403 Unspecified ptosis of bilateral eyelids: Secondary | ICD-10-CM | POA: Diagnosis not present

## 2020-05-22 DIAGNOSIS — G4733 Obstructive sleep apnea (adult) (pediatric): Secondary | ICD-10-CM | POA: Diagnosis not present

## 2020-05-22 DIAGNOSIS — Z951 Presence of aortocoronary bypass graft: Secondary | ICD-10-CM | POA: Diagnosis not present

## 2020-05-22 DIAGNOSIS — Z87891 Personal history of nicotine dependence: Secondary | ICD-10-CM | POA: Diagnosis not present

## 2020-05-22 DIAGNOSIS — H02831 Dermatochalasis of right upper eyelid: Secondary | ICD-10-CM | POA: Diagnosis not present

## 2020-05-22 DIAGNOSIS — L4050 Arthropathic psoriasis, unspecified: Secondary | ICD-10-CM | POA: Diagnosis not present

## 2020-05-22 DIAGNOSIS — I255 Ischemic cardiomyopathy: Secondary | ICD-10-CM | POA: Diagnosis not present

## 2020-05-22 DIAGNOSIS — I110 Hypertensive heart disease with heart failure: Secondary | ICD-10-CM | POA: Diagnosis not present

## 2020-05-22 DIAGNOSIS — Z9849 Cataract extraction status, unspecified eye: Secondary | ICD-10-CM | POA: Diagnosis not present

## 2020-05-24 DIAGNOSIS — Z23 Encounter for immunization: Secondary | ICD-10-CM | POA: Diagnosis not present

## 2020-05-28 DIAGNOSIS — I25810 Atherosclerosis of coronary artery bypass graft(s) without angina pectoris: Secondary | ICD-10-CM | POA: Diagnosis not present

## 2020-05-28 DIAGNOSIS — Z23 Encounter for immunization: Secondary | ICD-10-CM | POA: Diagnosis not present

## 2020-05-28 DIAGNOSIS — E782 Mixed hyperlipidemia: Secondary | ICD-10-CM | POA: Diagnosis not present

## 2020-05-28 DIAGNOSIS — I10 Essential (primary) hypertension: Secondary | ICD-10-CM | POA: Diagnosis not present

## 2020-05-28 DIAGNOSIS — I480 Paroxysmal atrial fibrillation: Secondary | ICD-10-CM | POA: Diagnosis not present

## 2020-05-28 DIAGNOSIS — I255 Ischemic cardiomyopathy: Secondary | ICD-10-CM | POA: Diagnosis not present

## 2020-05-28 DIAGNOSIS — I493 Ventricular premature depolarization: Secondary | ICD-10-CM | POA: Diagnosis not present

## 2020-06-24 DIAGNOSIS — Z79899 Other long term (current) drug therapy: Secondary | ICD-10-CM | POA: Diagnosis not present

## 2020-06-24 DIAGNOSIS — I2510 Atherosclerotic heart disease of native coronary artery without angina pectoris: Secondary | ICD-10-CM | POA: Diagnosis not present

## 2020-06-24 DIAGNOSIS — E785 Hyperlipidemia, unspecified: Secondary | ICD-10-CM | POA: Diagnosis not present

## 2020-06-24 DIAGNOSIS — I10 Essential (primary) hypertension: Secondary | ICD-10-CM | POA: Diagnosis not present

## 2020-06-24 DIAGNOSIS — R739 Hyperglycemia, unspecified: Secondary | ICD-10-CM | POA: Diagnosis not present

## 2020-06-24 DIAGNOSIS — I4891 Unspecified atrial fibrillation: Secondary | ICD-10-CM | POA: Diagnosis not present

## 2020-06-24 DIAGNOSIS — Z0000 Encounter for general adult medical examination without abnormal findings: Secondary | ICD-10-CM | POA: Diagnosis not present

## 2020-06-25 DIAGNOSIS — C61 Malignant neoplasm of prostate: Secondary | ICD-10-CM | POA: Diagnosis not present

## 2020-06-25 DIAGNOSIS — D400 Neoplasm of uncertain behavior of prostate: Secondary | ICD-10-CM | POA: Diagnosis not present

## 2020-06-25 DIAGNOSIS — R972 Elevated prostate specific antigen [PSA]: Secondary | ICD-10-CM | POA: Diagnosis not present

## 2020-07-02 DIAGNOSIS — L570 Actinic keratosis: Secondary | ICD-10-CM | POA: Diagnosis not present

## 2020-07-02 DIAGNOSIS — L821 Other seborrheic keratosis: Secondary | ICD-10-CM | POA: Diagnosis not present

## 2020-07-02 DIAGNOSIS — D0462 Carcinoma in situ of skin of left upper limb, including shoulder: Secondary | ICD-10-CM | POA: Diagnosis not present

## 2020-07-02 DIAGNOSIS — D485 Neoplasm of uncertain behavior of skin: Secondary | ICD-10-CM | POA: Diagnosis not present

## 2020-07-05 DIAGNOSIS — D400 Neoplasm of uncertain behavior of prostate: Secondary | ICD-10-CM | POA: Diagnosis not present

## 2020-07-05 DIAGNOSIS — C61 Malignant neoplasm of prostate: Secondary | ICD-10-CM | POA: Diagnosis not present

## 2020-07-05 DIAGNOSIS — R972 Elevated prostate specific antigen [PSA]: Secondary | ICD-10-CM | POA: Diagnosis not present

## 2020-07-09 ENCOUNTER — Other Ambulatory Visit: Payer: Self-pay | Admitting: Urology

## 2020-07-09 ENCOUNTER — Other Ambulatory Visit: Payer: Self-pay | Admitting: Orthopedic Surgery

## 2020-07-09 DIAGNOSIS — L570 Actinic keratosis: Secondary | ICD-10-CM | POA: Diagnosis not present

## 2020-07-09 DIAGNOSIS — R972 Elevated prostate specific antigen [PSA]: Secondary | ICD-10-CM

## 2020-07-09 DIAGNOSIS — D0462 Carcinoma in situ of skin of left upper limb, including shoulder: Secondary | ICD-10-CM | POA: Diagnosis not present

## 2020-07-09 DIAGNOSIS — D0461 Carcinoma in situ of skin of right upper limb, including shoulder: Secondary | ICD-10-CM | POA: Diagnosis not present

## 2020-07-17 DIAGNOSIS — R7303 Prediabetes: Secondary | ICD-10-CM | POA: Diagnosis not present

## 2020-07-17 DIAGNOSIS — I25810 Atherosclerosis of coronary artery bypass graft(s) without angina pectoris: Secondary | ICD-10-CM | POA: Diagnosis not present

## 2020-07-17 DIAGNOSIS — I10 Essential (primary) hypertension: Secondary | ICD-10-CM | POA: Diagnosis not present

## 2020-07-17 DIAGNOSIS — E782 Mixed hyperlipidemia: Secondary | ICD-10-CM | POA: Diagnosis not present

## 2020-07-17 DIAGNOSIS — Z79899 Other long term (current) drug therapy: Secondary | ICD-10-CM | POA: Diagnosis not present

## 2020-07-17 DIAGNOSIS — I255 Ischemic cardiomyopathy: Secondary | ICD-10-CM | POA: Diagnosis not present

## 2020-07-23 DIAGNOSIS — L400 Psoriasis vulgaris: Secondary | ICD-10-CM | POA: Diagnosis not present

## 2020-07-25 ENCOUNTER — Ambulatory Visit
Admission: RE | Admit: 2020-07-25 | Discharge: 2020-07-25 | Disposition: A | Discharge status: Home / Self Care | Payer: Medicare Other | Source: Ambulatory Visit | Attending: Urology | Admitting: Urology

## 2020-07-25 ENCOUNTER — Other Ambulatory Visit: Payer: Self-pay

## 2020-07-25 DIAGNOSIS — R972 Elevated prostate specific antigen [PSA]: Secondary | ICD-10-CM | POA: Diagnosis not present

## 2020-07-25 DIAGNOSIS — K4090 Unilateral inguinal hernia, without obstruction or gangrene, not specified as recurrent: Secondary | ICD-10-CM | POA: Diagnosis not present

## 2020-07-25 MED ORDER — GADOBUTROL 1 MMOL/ML IV SOLN
10.0000 mL | Freq: Once | INTRAVENOUS | Status: AC | PRN
  Administered 2020-07-25: 10 mL via INTRAVENOUS

## 2020-08-08 DIAGNOSIS — C61 Malignant neoplasm of prostate: Secondary | ICD-10-CM | POA: Diagnosis not present

## 2020-08-08 DIAGNOSIS — R972 Elevated prostate specific antigen [PSA]: Secondary | ICD-10-CM | POA: Diagnosis not present

## 2020-08-08 DIAGNOSIS — D400 Neoplasm of uncertain behavior of prostate: Secondary | ICD-10-CM | POA: Diagnosis not present

## 2020-12-17 ENCOUNTER — Ambulatory Visit (INDEPENDENT_AMBULATORY_CARE_PROVIDER_SITE_OTHER): Payer: Medicare Other | Admitting: Vascular Surgery

## 2020-12-17 ENCOUNTER — Encounter (INDEPENDENT_AMBULATORY_CARE_PROVIDER_SITE_OTHER): Payer: Medicare Other

## 2022-06-15 ENCOUNTER — Encounter (INDEPENDENT_AMBULATORY_CARE_PROVIDER_SITE_OTHER): Payer: Self-pay
